# Patient Record
Sex: Female | Born: 1945
Health system: Southern US, Community
[De-identification: ages and names within clinical notes are randomized; demographics above are authoritative.]

## PROBLEM LIST (undated history)

## (undated) DIAGNOSIS — I10 Essential (primary) hypertension: Secondary | ICD-10-CM

## (undated) DIAGNOSIS — M549 Dorsalgia, unspecified: Secondary | ICD-10-CM

## (undated) DIAGNOSIS — E119 Type 2 diabetes mellitus without complications: Secondary | ICD-10-CM

## (undated) DIAGNOSIS — N189 Chronic kidney disease, unspecified: Secondary | ICD-10-CM

## (undated) HISTORY — PX: ABDOMINAL HYSTERECTOMY: SHX81

## (undated) HISTORY — DX: Chronic kidney disease, unspecified: N18.9

---

## 1999-06-27 ENCOUNTER — Emergency Department (HOSPITAL_COMMUNITY): Admission: EM | Admit: 1999-06-27 | Discharge: 1999-06-27 | Payer: Self-pay | Admitting: Emergency Medicine

## 1999-06-28 ENCOUNTER — Encounter: Payer: Self-pay | Admitting: Emergency Medicine

## 2000-12-04 ENCOUNTER — Emergency Department (HOSPITAL_COMMUNITY): Admission: EM | Admit: 2000-12-04 | Discharge: 2000-12-04 | Payer: Self-pay | Admitting: Emergency Medicine

## 2001-01-22 ENCOUNTER — Encounter: Payer: Self-pay | Admitting: General Practice

## 2001-01-22 ENCOUNTER — Encounter: Admission: RE | Admit: 2001-01-22 | Discharge: 2001-01-22 | Payer: Self-pay | Admitting: General Practice

## 2001-01-31 ENCOUNTER — Encounter: Payer: Self-pay | Admitting: *Deleted

## 2001-01-31 ENCOUNTER — Emergency Department (HOSPITAL_COMMUNITY): Admission: EM | Admit: 2001-01-31 | Discharge: 2001-01-31 | Payer: Self-pay

## 2001-02-28 ENCOUNTER — Encounter: Payer: Self-pay | Admitting: Neurological Surgery

## 2001-02-28 ENCOUNTER — Ambulatory Visit (HOSPITAL_COMMUNITY): Admission: RE | Admit: 2001-02-28 | Discharge: 2001-02-28 | Payer: Self-pay | Admitting: Neurological Surgery

## 2001-10-27 ENCOUNTER — Encounter: Payer: Self-pay | Admitting: Internal Medicine

## 2001-10-27 ENCOUNTER — Ambulatory Visit (HOSPITAL_COMMUNITY): Admission: RE | Admit: 2001-10-27 | Discharge: 2001-10-27 | Payer: Self-pay | Admitting: Internal Medicine

## 2002-01-03 ENCOUNTER — Emergency Department (HOSPITAL_COMMUNITY): Admission: EM | Admit: 2002-01-03 | Discharge: 2002-01-03 | Payer: Self-pay | Admitting: *Deleted

## 2002-11-08 ENCOUNTER — Emergency Department (HOSPITAL_COMMUNITY): Admission: EM | Admit: 2002-11-08 | Discharge: 2002-11-08 | Payer: Self-pay | Admitting: Emergency Medicine

## 2003-04-22 ENCOUNTER — Ambulatory Visit (HOSPITAL_COMMUNITY): Admission: RE | Admit: 2003-04-22 | Discharge: 2003-04-22 | Payer: Self-pay | Admitting: Family Medicine

## 2003-04-22 ENCOUNTER — Encounter: Admission: RE | Admit: 2003-04-22 | Discharge: 2003-04-22 | Payer: Self-pay | Admitting: Family Medicine

## 2003-05-11 ENCOUNTER — Encounter: Admission: RE | Admit: 2003-05-11 | Discharge: 2003-05-11 | Payer: Self-pay | Admitting: Sports Medicine

## 2003-05-14 ENCOUNTER — Ambulatory Visit (HOSPITAL_COMMUNITY): Admission: RE | Admit: 2003-05-14 | Discharge: 2003-05-14 | Payer: Self-pay | Admitting: Family Medicine

## 2003-05-14 ENCOUNTER — Encounter: Payer: Self-pay | Admitting: Family Medicine

## 2003-05-20 ENCOUNTER — Encounter: Admission: RE | Admit: 2003-05-20 | Discharge: 2003-08-18 | Payer: Self-pay | Admitting: Family Medicine

## 2003-05-26 ENCOUNTER — Encounter: Admission: RE | Admit: 2003-05-26 | Discharge: 2003-05-26 | Payer: Self-pay | Admitting: Family Medicine

## 2003-08-03 ENCOUNTER — Encounter: Admission: RE | Admit: 2003-08-03 | Discharge: 2003-08-03 | Payer: Self-pay | Admitting: Family Medicine

## 2003-08-27 ENCOUNTER — Encounter: Admission: RE | Admit: 2003-08-27 | Discharge: 2003-08-27 | Payer: Self-pay | Admitting: Sports Medicine

## 2003-08-27 ENCOUNTER — Encounter: Payer: Self-pay | Admitting: Sports Medicine

## 2003-08-30 ENCOUNTER — Encounter (INDEPENDENT_AMBULATORY_CARE_PROVIDER_SITE_OTHER): Payer: Self-pay | Admitting: *Deleted

## 2003-08-30 LAB — CONVERTED CEMR LAB

## 2003-09-03 ENCOUNTER — Encounter: Admission: RE | Admit: 2003-09-03 | Discharge: 2003-09-03 | Payer: Self-pay | Admitting: Sports Medicine

## 2003-11-24 ENCOUNTER — Encounter: Admission: RE | Admit: 2003-11-24 | Discharge: 2003-11-24 | Payer: Self-pay | Admitting: Family Medicine

## 2004-03-14 ENCOUNTER — Encounter: Admission: RE | Admit: 2004-03-14 | Discharge: 2004-03-14 | Payer: Self-pay | Admitting: Family Medicine

## 2004-08-03 ENCOUNTER — Ambulatory Visit: Payer: Self-pay | Admitting: Family Medicine

## 2004-08-10 ENCOUNTER — Ambulatory Visit: Payer: Self-pay | Admitting: Family Medicine

## 2004-11-28 ENCOUNTER — Ambulatory Visit: Payer: Self-pay | Admitting: Family Medicine

## 2004-12-14 ENCOUNTER — Encounter: Admission: RE | Admit: 2004-12-14 | Discharge: 2004-12-14 | Payer: Self-pay | Admitting: Family Medicine

## 2005-04-03 ENCOUNTER — Ambulatory Visit: Payer: Self-pay | Admitting: Family Medicine

## 2005-04-18 ENCOUNTER — Ambulatory Visit: Payer: Self-pay | Admitting: Family Medicine

## 2005-08-08 ENCOUNTER — Ambulatory Visit: Payer: Self-pay | Admitting: Family Medicine

## 2005-10-06 ENCOUNTER — Emergency Department (HOSPITAL_COMMUNITY): Admission: AD | Admit: 2005-10-06 | Discharge: 2005-10-06 | Payer: Self-pay | Admitting: Family Medicine

## 2005-10-08 ENCOUNTER — Ambulatory Visit: Payer: Self-pay | Admitting: Family Medicine

## 2005-12-20 ENCOUNTER — Ambulatory Visit: Payer: Self-pay | Admitting: Sports Medicine

## 2006-01-02 ENCOUNTER — Ambulatory Visit: Payer: Self-pay | Admitting: Family Medicine

## 2006-07-30 ENCOUNTER — Ambulatory Visit: Payer: Self-pay | Admitting: Sports Medicine

## 2006-08-13 ENCOUNTER — Encounter: Admission: RE | Admit: 2006-08-13 | Discharge: 2006-08-13 | Payer: Self-pay | Admitting: *Deleted

## 2006-11-28 ENCOUNTER — Ambulatory Visit: Payer: Self-pay | Admitting: Family Medicine

## 2007-01-23 DIAGNOSIS — E119 Type 2 diabetes mellitus without complications: Secondary | ICD-10-CM | POA: Insufficient documentation

## 2007-01-23 DIAGNOSIS — E7800 Pure hypercholesterolemia, unspecified: Secondary | ICD-10-CM | POA: Insufficient documentation

## 2007-01-23 DIAGNOSIS — E1169 Type 2 diabetes mellitus with other specified complication: Secondary | ICD-10-CM | POA: Insufficient documentation

## 2007-01-23 DIAGNOSIS — I10 Essential (primary) hypertension: Secondary | ICD-10-CM | POA: Insufficient documentation

## 2007-01-23 DIAGNOSIS — E1159 Type 2 diabetes mellitus with other circulatory complications: Secondary | ICD-10-CM | POA: Insufficient documentation

## 2007-01-24 ENCOUNTER — Encounter (INDEPENDENT_AMBULATORY_CARE_PROVIDER_SITE_OTHER): Payer: Self-pay | Admitting: *Deleted

## 2007-04-23 ENCOUNTER — Ambulatory Visit: Payer: Self-pay | Admitting: Family Medicine

## 2007-04-23 ENCOUNTER — Telehealth: Payer: Self-pay | Admitting: *Deleted

## 2007-04-25 ENCOUNTER — Telehealth: Payer: Self-pay | Admitting: Family Medicine

## 2007-04-25 ENCOUNTER — Emergency Department (HOSPITAL_COMMUNITY): Admission: EM | Admit: 2007-04-25 | Discharge: 2007-04-25 | Payer: Self-pay | Admitting: Family Medicine

## 2007-04-30 ENCOUNTER — Ambulatory Visit: Payer: Self-pay | Admitting: Family Medicine

## 2007-05-05 ENCOUNTER — Encounter: Admission: RE | Admit: 2007-05-05 | Discharge: 2007-05-05 | Payer: Self-pay | Admitting: Sports Medicine

## 2007-05-14 ENCOUNTER — Ambulatory Visit: Payer: Self-pay | Admitting: Family Medicine

## 2007-05-14 DIAGNOSIS — M5126 Other intervertebral disc displacement, lumbar region: Secondary | ICD-10-CM | POA: Insufficient documentation

## 2007-05-14 HISTORY — DX: Other intervertebral disc displacement, lumbar region: M51.26

## 2007-06-05 LAB — CONVERTED CEMR LAB
LDL Cholesterol: 93 mg/dL
LDL Cholesterol: 93 mg/dL
LDL Cholesterol: 93 mg/dL
LDL Cholesterol: 93 mg/dL
LDL Cholesterol: 93 mg/dL

## 2007-06-13 ENCOUNTER — Encounter (INDEPENDENT_AMBULATORY_CARE_PROVIDER_SITE_OTHER): Payer: Self-pay | Admitting: *Deleted

## 2007-06-13 ENCOUNTER — Ambulatory Visit: Payer: Self-pay | Admitting: Family Medicine

## 2007-06-13 LAB — CONVERTED CEMR LAB
BUN: 14 mg/dL (ref 6–23)
CO2: 26 meq/L (ref 19–32)
Calcium: 9.4 mg/dL (ref 8.4–10.5)
Chloride: 103 meq/L (ref 96–112)
Creatinine, Ser: 0.8 mg/dL (ref 0.40–1.20)
Direct LDL: 93 mg/dL
Glucose, Bld: 235 mg/dL — ABNORMAL HIGH (ref 70–99)
Potassium: 4.4 meq/L (ref 3.5–5.3)
Sodium: 140 meq/L (ref 135–145)

## 2007-06-17 ENCOUNTER — Encounter (INDEPENDENT_AMBULATORY_CARE_PROVIDER_SITE_OTHER): Payer: Self-pay | Admitting: *Deleted

## 2007-07-11 ENCOUNTER — Ambulatory Visit: Payer: Self-pay | Admitting: Family Medicine

## 2007-12-04 ENCOUNTER — Encounter (INDEPENDENT_AMBULATORY_CARE_PROVIDER_SITE_OTHER): Payer: Self-pay | Admitting: *Deleted

## 2007-12-04 ENCOUNTER — Ambulatory Visit: Payer: Self-pay | Admitting: Family Medicine

## 2007-12-04 LAB — CONVERTED CEMR LAB
ALT: 18 units/L (ref 0–35)
AST: 14 units/L (ref 0–37)
Albumin: 4.1 g/dL (ref 3.5–5.2)
Alkaline Phosphatase: 114 units/L (ref 39–117)
BUN: 15 mg/dL (ref 6–23)
CO2: 28 meq/L (ref 19–32)
Calcium: 9.5 mg/dL (ref 8.4–10.5)
Chloride: 99 meq/L (ref 96–112)
Creatinine, Ser: 0.82 mg/dL (ref 0.40–1.20)
Glucose, Bld: 324 mg/dL — ABNORMAL HIGH (ref 70–99)
Hgb A1c MFr Bld: 9.1 %
Potassium: 4.3 meq/L (ref 3.5–5.3)
Sodium: 139 meq/L (ref 135–145)
TSH: 0.623 microintl units/mL (ref 0.350–5.50)
Total Bilirubin: 0.4 mg/dL (ref 0.3–1.2)
Total Protein: 7.2 g/dL (ref 6.0–8.3)

## 2008-05-18 ENCOUNTER — Ambulatory Visit: Payer: Self-pay | Admitting: Family Medicine

## 2008-05-18 DIAGNOSIS — S335XXA Sprain of ligaments of lumbar spine, initial encounter: Secondary | ICD-10-CM | POA: Insufficient documentation

## 2008-05-18 LAB — CONVERTED CEMR LAB: Hgb A1c MFr Bld: 9.5 %

## 2008-06-17 ENCOUNTER — Emergency Department (HOSPITAL_COMMUNITY): Admission: EM | Admit: 2008-06-17 | Discharge: 2008-06-18 | Payer: Self-pay | Admitting: Family Medicine

## 2008-06-29 ENCOUNTER — Telehealth: Payer: Self-pay | Admitting: Family Medicine

## 2008-10-04 ENCOUNTER — Ambulatory Visit: Payer: Self-pay | Admitting: Family Medicine

## 2008-10-04 ENCOUNTER — Encounter: Payer: Self-pay | Admitting: *Deleted

## 2008-10-04 LAB — CONVERTED CEMR LAB: Hgb A1c MFr Bld: 10.6 %

## 2008-10-08 ENCOUNTER — Ambulatory Visit: Payer: Self-pay | Admitting: Family Medicine

## 2008-10-08 ENCOUNTER — Encounter: Payer: Self-pay | Admitting: Family Medicine

## 2008-10-08 LAB — CONVERTED CEMR LAB
Cholesterol: 184 mg/dL (ref 0–200)
HDL: 76 mg/dL (ref >39)
LDL Cholesterol: 96 mg/dL (ref 0–99)
Total CHOL/HDL Ratio: 2.4
Triglycerides: 61 mg/dL (ref <150)
VLDL: 12 mg/dL (ref 0–40)

## 2008-10-18 ENCOUNTER — Encounter: Payer: Self-pay | Admitting: Family Medicine

## 2009-06-15 ENCOUNTER — Telehealth: Payer: Self-pay | Admitting: Family Medicine

## 2009-06-16 ENCOUNTER — Ambulatory Visit: Payer: Self-pay | Admitting: Family Medicine

## 2009-06-16 DIAGNOSIS — M719 Bursopathy, unspecified: Secondary | ICD-10-CM

## 2009-06-16 DIAGNOSIS — M67919 Unspecified disorder of synovium and tendon, unspecified shoulder: Secondary | ICD-10-CM | POA: Insufficient documentation

## 2009-06-27 ENCOUNTER — Telehealth: Payer: Self-pay | Admitting: Family Medicine

## 2009-07-08 ENCOUNTER — Ambulatory Visit: Payer: Self-pay | Admitting: Family Medicine

## 2009-07-08 ENCOUNTER — Encounter: Payer: Self-pay | Admitting: Family Medicine

## 2009-07-08 DIAGNOSIS — E669 Obesity, unspecified: Secondary | ICD-10-CM | POA: Insufficient documentation

## 2009-07-08 LAB — CONVERTED CEMR LAB: Hgb A1c MFr Bld: 7.7 %

## 2009-07-11 ENCOUNTER — Encounter: Payer: Self-pay | Admitting: Family Medicine

## 2009-07-11 LAB — CONVERTED CEMR LAB
ALT: 19 U/L (ref 0–35)
AST: 17 U/L (ref 0–37)
Albumin: 4 g/dL (ref 3.5–5.2)
Alkaline Phosphatase: 95 U/L (ref 39–117)
BUN: 22 mg/dL (ref 6–23)
CO2: 27 meq/L (ref 19–32)
Calcium: 9.5 mg/dL (ref 8.4–10.5)
Chloride: 102 meq/L (ref 96–112)
Creatinine, Ser: 0.82 mg/dL (ref 0.40–1.20)
Direct LDL: 78 mg/dL
Glucose, Bld: 155 mg/dL — ABNORMAL HIGH (ref 70–99)
Potassium: 4 meq/L (ref 3.5–5.3)
Sodium: 141 meq/L (ref 135–145)
Total Bilirubin: 0.4 mg/dL (ref 0.3–1.2)
Total Protein: 7.1 g/dL (ref 6.0–8.3)

## 2009-07-28 ENCOUNTER — Encounter: Payer: Self-pay | Admitting: Family Medicine

## 2009-08-05 ENCOUNTER — Ambulatory Visit: Payer: Self-pay | Admitting: Family Medicine

## 2009-09-03 ENCOUNTER — Encounter (INDEPENDENT_AMBULATORY_CARE_PROVIDER_SITE_OTHER): Payer: Self-pay | Admitting: *Deleted

## 2009-09-03 DIAGNOSIS — Z87891 Personal history of nicotine dependence: Secondary | ICD-10-CM | POA: Insufficient documentation

## 2009-10-17 ENCOUNTER — Ambulatory Visit: Payer: Self-pay | Admitting: Family Medicine

## 2009-10-17 ENCOUNTER — Ambulatory Visit (HOSPITAL_COMMUNITY): Admission: RE | Admit: 2009-10-17 | Discharge: 2009-10-17 | Payer: Self-pay | Admitting: Family Medicine

## 2009-10-19 ENCOUNTER — Encounter: Payer: Self-pay | Admitting: *Deleted

## 2009-10-24 ENCOUNTER — Encounter: Payer: Self-pay | Admitting: Family Medicine

## 2009-11-09 DIAGNOSIS — IMO0002 Reserved for concepts with insufficient information to code with codable children: Secondary | ICD-10-CM | POA: Insufficient documentation

## 2009-11-09 DIAGNOSIS — M5430 Sciatica, unspecified side: Secondary | ICD-10-CM | POA: Insufficient documentation

## 2009-12-12 ENCOUNTER — Encounter (INDEPENDENT_AMBULATORY_CARE_PROVIDER_SITE_OTHER): Payer: Self-pay | Admitting: *Deleted

## 2010-01-23 ENCOUNTER — Encounter: Payer: Self-pay | Admitting: Family Medicine

## 2010-01-26 ENCOUNTER — Telehealth: Payer: Self-pay | Admitting: *Deleted

## 2010-04-07 ENCOUNTER — Encounter: Payer: Self-pay | Admitting: Family Medicine

## 2010-09-11 ENCOUNTER — Telehealth: Payer: Self-pay | Admitting: Family Medicine

## 2010-09-28 ENCOUNTER — Encounter: Payer: Self-pay | Admitting: Family Medicine

## 2010-10-13 ENCOUNTER — Encounter: Payer: Self-pay | Admitting: Family Medicine

## 2010-10-13 ENCOUNTER — Ambulatory Visit: Payer: Self-pay | Admitting: Family Medicine

## 2010-10-13 DIAGNOSIS — R634 Abnormal weight loss: Secondary | ICD-10-CM | POA: Insufficient documentation

## 2010-10-13 LAB — CONVERTED CEMR LAB: Hgb A1c MFr Bld: 6.9 %

## 2010-10-18 ENCOUNTER — Encounter: Payer: Self-pay | Admitting: Family Medicine

## 2010-10-18 LAB — CONVERTED CEMR LAB
ALT: 19 units/L (ref 0–35)
AST: 18 units/L (ref 0–37)
Albumin: 4.3 g/dL (ref 3.5–5.2)
Alkaline Phosphatase: 90 units/L (ref 39–117)
BUN: 19 mg/dL (ref 6–23)
Basophils Absolute: 0 10*3/uL (ref 0.0–0.1)
Basophils Relative: 0 % (ref 0–1)
CO2: 31 meq/L (ref 19–32)
Calcium: 9.6 mg/dL (ref 8.4–10.5)
Chloride: 100 meq/L (ref 96–112)
Cholesterol: 169 mg/dL (ref 0–200)
Creatinine, Ser: 0.94 mg/dL (ref 0.40–1.20)
Eosinophils Absolute: 0.3 10*3/uL (ref 0.0–0.7)
Eosinophils Relative: 4 % (ref 0–5)
Glucose, Bld: 189 mg/dL — ABNORMAL HIGH (ref 70–99)
HCT: 35.3 % — ABNORMAL LOW (ref 36.0–46.0)
HDL: 79 mg/dL (ref >39)
Hemoglobin: 11.3 g/dL — ABNORMAL LOW (ref 12.0–15.0)
LDL Cholesterol: 81 mg/dL (ref 0–99)
Lymphocytes Relative: 31 % (ref 12–46)
Lymphs Abs: 3 10*3/uL (ref 0.7–4.0)
MCHC: 32 g/dL (ref 30.0–36.0)
MCV: 93.1 fL (ref 78.0–100.0)
Monocytes Absolute: 0.6 10*3/uL (ref 0.1–1.0)
Monocytes Relative: 7 % (ref 3–12)
Neutro Abs: 5.7 10*3/uL (ref 1.7–7.7)
Neutrophils Relative %: 59 % (ref 43–77)
Platelets: 189 10*3/uL (ref 150–400)
Potassium: 4.2 meq/L (ref 3.5–5.3)
RBC: 3.79 M/uL — ABNORMAL LOW (ref 3.87–5.11)
RDW: 12.3 % (ref 11.5–15.5)
Sodium: 138 meq/L (ref 135–145)
TSH: 0.741 microintl units/mL (ref 0.350–4.500)
Total Bilirubin: 0.6 mg/dL (ref 0.3–1.2)
Total CHOL/HDL Ratio: 2.1
Total Protein: 6.9 g/dL (ref 6.0–8.3)
Triglycerides: 45 mg/dL (ref <150)
VLDL: 9 mg/dL (ref 0–40)
WBC: 9.7 10*3/uL (ref 4.0–10.5)

## 2010-11-02 ENCOUNTER — Telehealth: Payer: Self-pay | Admitting: Family Medicine

## 2010-11-22 ENCOUNTER — Encounter: Payer: Self-pay | Admitting: Family Medicine

## 2010-11-22 ENCOUNTER — Ambulatory Visit
Admission: RE | Admit: 2010-11-22 | Discharge: 2010-11-22 | Payer: Self-pay | Source: Home / Self Care | Attending: Family Medicine | Admitting: Family Medicine

## 2010-11-22 LAB — CONVERTED CEMR LAB
ALT: 23 units/L (ref 0–35)
AST: 19 units/L (ref 0–37)
Albumin: 3.6 g/dL (ref 3.5–5.2)
Alkaline Phosphatase: 88 units/L (ref 39–117)
BUN: 29 mg/dL — ABNORMAL HIGH (ref 6–23)
CO2: 29 meq/L (ref 19–32)
Calcium: 9.3 mg/dL (ref 8.4–10.5)
Chloride: 102 meq/L (ref 96–112)
Creatinine, Ser: 1.13 mg/dL (ref 0.40–1.20)
Glucose, Bld: 220 mg/dL — ABNORMAL HIGH (ref 70–99)
HCT: 34.7 % — ABNORMAL LOW (ref 36.0–46.0)
Hemoglobin: 11.4 g/dL — ABNORMAL LOW (ref 12.0–15.0)
MCHC: 32.9 g/dL (ref 30.0–36.0)
MCV: 93 fL (ref 78.0–100.0)
Platelets: 183 10*3/uL (ref 150–400)
Potassium: 4.3 meq/L (ref 3.5–5.3)
RBC: 3.73 M/uL — ABNORMAL LOW (ref 3.87–5.11)
RDW: 12.2 % (ref 11.5–15.5)
Sodium: 138 meq/L (ref 135–145)
Total Bilirubin: 0.4 mg/dL (ref 0.3–1.2)
Total Protein: 6.6 g/dL (ref 6.0–8.3)
WBC: 10.5 10*3/uL (ref 4.0–10.5)

## 2010-11-23 ENCOUNTER — Encounter
Admission: RE | Admit: 2010-11-23 | Discharge: 2010-11-23 | Payer: Self-pay | Source: Home / Self Care | Attending: Family Medicine | Admitting: Family Medicine

## 2010-11-25 ENCOUNTER — Emergency Department (HOSPITAL_COMMUNITY)
Admission: EM | Admit: 2010-11-25 | Discharge: 2010-11-25 | Payer: Self-pay | Source: Home / Self Care | Admitting: Family Medicine

## 2010-11-28 ENCOUNTER — Encounter: Payer: Self-pay | Admitting: Family Medicine

## 2010-11-28 ENCOUNTER — Ambulatory Visit
Admission: RE | Admit: 2010-11-28 | Discharge: 2010-11-28 | Payer: Self-pay | Source: Home / Self Care | Attending: Family Medicine | Admitting: Family Medicine

## 2010-11-28 ENCOUNTER — Telehealth: Payer: Self-pay | Admitting: *Deleted

## 2010-11-28 DIAGNOSIS — H532 Diplopia: Secondary | ICD-10-CM | POA: Insufficient documentation

## 2010-11-28 LAB — CONVERTED CEMR LAB: TSH: 0.959 microintl units/mL (ref 0.350–4.500)

## 2010-12-26 NOTE — Miscellaneous (Signed)
Summary: RX Refill  Prescriptions: OMEPRAZOLE 40 MG CPDR (OMEPRAZOLE) 1 tab by mouth daily for stomach  #30 Capsule x 4   Entered and Authorized by:   Suzanna Obey MD   Signed by:   Suzanna Obey MD on 09/28/2010   Method used:   Electronically to        [facility] (retail)       [place], [place]  [id number]       Ph: [phone]       Fax: [phone]   [id number] VASERETIC 10-25 MG TABS (ENALAPRIL-HYDROCHLOROTHIAZIDE) 1 tab by mouth daily for blood pressure  #30 Tablet x 0   Entered and Authorized by:   Suzanna Obey MD   Signed by:   Suzanna Obey MD on 09/28/2010   Method used:   Electronically to        [facility] (retail)       [place], [place]  [id number]       Ph: [phone]       Fax: [phone]   [id number] NEURONTIN 600 MG  TABS (GABAPENTIN) One tab by mouth TID  #90 Tablet x 0   Entered and Authorized by:   Suzanna Obey MD   Signed by:   Suzanna Obey MD on 09/28/2010   Method used:   Electronically to        [facility] (retail)       [place], [place]  [id number]       Ph: [phone]       Fax: [phone]   [id number] ZOCOR 80 MG TABS (SIMVASTATIN) Take 1 tablet by mouth at bedtime  #30 Tablet x 0   Entered and Authorized by:   Suzanna Obey MD   Signed by:   Suzanna Obey MD on 09/28/2010   Method used:   Electronically to        [facility] [address] [address] (retail)       [place], [place]  [id number]       Ph: [phone]       Fax: [phone]   [id number] GLUCOTROL XL 10 MG TB24 (GLIPIZIDE) One tab by mouth daily (needs office visit for reflil)  #30 x 0   Entered and Authorized by:   Suzanna Obey MD   Signed by:   Suzanna Obey MD on 09/28/2010   Method used:   Electronically to        [facility] (retail)       [place], [place]  [id number]       Ph: [phone]       Fax: [phone]   [id number]  Patient needs a refill of all her prescriptions.  She has an appt with you today that had to be bumped, so she is rescheduled to see you on Nov 18th.  She uses [facility]. Kimberly Carlson  September 28, 2010 8:49 AM

## 2010-12-26 NOTE — Progress Notes (Signed)
Summary: Rx Req  Phone Note Refill Request Call back at [phone] Message from:  Patient  Refills Requested: Medication #1:  VASERETIC 10-25 MG TABS 1 tab by mouth daily for blood pressure  Medication #2:  ZOCOR 80 MG TABS Take 1 tablet by mouth at bedtime [facility].  Initial call taken by: Raymond Gurney,  September 11, 2010 2:05 PM  Follow-up for Phone Call        will refill until she comes for her appt on nov 4th Follow-up by: Suzanna Obey MD,  September 11, 2010 2:36 PM    Prescriptions: VASERETIC 10-25 MG TABS (ENALAPRIL-HYDROCHLOROTHIAZIDE) 1 tab by mouth daily for blood pressure  #30 Tablet x 0   Entered and Authorized by:   Suzanna Obey MD   Signed by:   Suzanna Obey MD on 09/11/2010   Method used:   Electronically to        [facility] (retail)       [place], [place]  [id number]       Ph: [phone]       Fax: [phone]   [id number] ZOCOR 80 MG TABS (SIMVASTATIN) Take 1 tablet by mouth at bedtime  #30 Tablet x 0   Entered and Authorized by:   Suzanna Obey MD   Signed by:   Suzanna Obey MD on 09/11/2010   Method used:   Electronically to        [facility] (retail)       [address]       [place], [place]  [id number]       Ph: [phone]       Fax: [phone]   [id number]

## 2010-12-26 NOTE — Assessment & Plan Note (Signed)
Summary: pain in leg,tcb   Vital Signs:  Patient profile:   65 year old female Weight:      193.9 pounds Temp:     98.8 degrees F Pulse rate:   98 / minute BP sitting:   147 / 69  (left arm)  Vitals Entered By: Marcell Barlow RN (October 17, 2009 1:57 PM)  Serial Vital Signs/Assessments:  Time      Position  BP       Pulse  Resp  Temp     By 2:46 PM             130/70                         Carin Hock MD  CC: left leg pain and chest pain Pain Assessment Patient in pain? yes     Location: left leg pain and cherst pain Intensity: leg, 9 chest 5   Primary Care Provider:  Carin Hock MD  CC:  left leg pain and chest pain.  History of Present Illness: Pt here for left leg pain, but when vitals taken by clinical staff, she endorsed chest pain.  Discussed:    1.  stabbing pain in substernal chest.  also a heaviness.  started last night.  took baking soda last night.  better after an hour.  initially pain was 7/10  but went down to 5/10 after baking soda.  no sob.  no radiation.  no nausea.  no lightheaded or diaphoresis.    2.  left leg pain--has history of a herniated disc. had been doing pretty well until 4 days ago. pain is in left lower back radiating to  left leg.    starts left lower back.  goes to distal calf.  entire  leg hurts.  no numbness or paresthesias, just pain.  no sadlle anesthesia.  no bowel or bladder incontinence.  tried ice and heat.  also tried ibuprofen 800 and gabapentin.  no relief.    3.  dm--due for f/u appt.  did not discuss today    Current Medications (verified): 1)  Bayer Childrens Aspirin 81 Mg Chew (Aspirin) .... Take 1 Tablet By Mouth Once A Day 2)  Glucotrol Xl 10 Mg Tb24 (Glipizide) .... One Tab By Mouth Daily 3)  Zocor 80 Mg Tabs (Simvastatin) .... Take 1 Tablet By Mouth At Bedtime 4)  Neurontin 600 Mg  Tabs (Gabapentin) .... One Tab By Mouth Tid 5)  Vaseretic 10-25 Mg Tabs (Enalapril-Hydrochlorothiazide) .Marland Kitchen.. 1 Tab By Mouth  Daily For Blood Pressure 6)  Metformin Hcl 500 Mg Tabs (Metformin Hcl) .Marland Kitchen.. 1 Tab By Mouth Daily For 2 Weeks; Then 1 Tab By Mouth Two Times A Day For Diabetes  Allergies: 1)  ! Glucophage  Past History:  Past Medical History: Reviewed history from 12/04/2007 and no changes required. HERNIATED LUMBAR DISC (ICD-722.10) via MRI 04/2007 HYPERTENSION, BENIGN SYSTEMIC (ICD-401.1) HYPERCHOLESTEROLEMIA (ICD-272.0) DIABETES MELLITUS II, UNCOMPLICATED (XX123456) CATARACT (ICD-366.9)  ekg - wnl - 04/22/2003  Physical Exam  General:  Well-developed,well-nourished,in no acute distress; alert,appropriate and cooperative throughout examination Chest Wall:  no ttp Lungs:  Normal respiratory effort, chest expands symmetrically. Lungs are clear to auscultation, no crackles or wheezes. Heart:  Normal rate and regular rhythm. S1 and S2 normal without gallop, murmur, click, rub or other extra sounds.   Detailed Back/Spine Exam  General:    Well-developed, well-nourished, in no acute distress; alert and oriented x 3.  Gait:    antalgic--favors right leg  Skin:    Intact with no erythema; no scarring.    Palpation:    no ttp over L-spine or paraspinous muscles, but is ttp over sciatic notch  Lumbosacral Exam:  Range of Motion:    Forward Flexion:   90 degrees    Hyperextension:   25 degrees    Right Lateral Bend:   25 degrees    Left Lateral Bend:   25 degrees Squatting:  normal    does not reverse curve with forward flexion Lying Straight Leg Raise:    Right:  negative    Left:  negative Sitting Straight Leg Raise:    Right:  negative    Left:  negative Sciatic Notch:    There is left sciatic notch tenderness. Toe Walking:    Right:  normal    Left:  normal Heel Walking:    Right:  normal    Left:  normal     can heel-toe walk.  cannot elicit lower extremity reflexes   Impression & Recommendations:  Problem # 1:  CHEST PAIN (ICD-786.50) Assessment New normal EKg,  which is very reassuring.  Additionally, pain not typical angina description.   think this is likely GI.  However, given her risk factors, think a cardiology consult for stress testing is warranted.  in the meantime, start PPI.  gave red flags for immediate medical attention Orders: 12 Lead EKG (12 Lead EKG) Cardiology Referral (Cardiology) [facility]- Est  Level 4 [id number])  Problem # 2:  HERNIATED LUMBAR DISC (ICD-722.10) Assessment: Deteriorated In the past has responded to conservative treatment.  Will give nsaids, flexeril.  vicodin for breakthrough pain.  physical therapy referral as well.   Orders: Physical Therapy Referral (PT) [facility]- Est  Level 4 [id number])  Problem # 3:  DIABETES MELLITUS II, UNCOMPLICATED (XX123456) Assessment: Unchanged needs appt to follow up dm.  Reviewed flowsheet.  Her updated medication list for this problem includes:    Bayer Childrens Aspirin 81 Mg Chew (Aspirin) .Marland Kitchen... Take 1 tablet by mouth once a day    Glucotrol Xl 10 Mg Tb24 (Glipizide) ..... One tab by mouth daily    Vaseretic 10-25 Mg Tabs (Enalapril-hydrochlorothiazide) .Marland Kitchen... 1 tab by mouth daily for blood pressure    Metformin Hcl 500 Mg Tabs (Metformin hcl) .Marland Kitchen... 1 tab by mouth daily for 2 weeks; then 1 tab by mouth two times a day for diabetes  Problem # 4:  HYPERTENSION, BENIGN SYSTEMIC (ICD-401.1) Assessment: Unchanged bp high initially, but at goal on recheck Her updated medication list for this problem includes:    Vaseretic 10-25 Mg Tabs (Enalapril-hydrochlorothiazide) .Marland Kitchen... 1 tab by mouth daily for blood pressure  Complete Medication List: 1)  Bayer Childrens Aspirin 81 Mg Chew (Aspirin) .... Take 1 tablet by mouth once a day 2)  Glucotrol Xl 10 Mg Tb24 (Glipizide) .... One tab by mouth daily 3)  Zocor 80 Mg Tabs (Simvastatin) .... Take 1 tablet by mouth at bedtime 4)  Neurontin 600 Mg Tabs (Gabapentin) .... One tab by mouth tid 5)  Vaseretic 10-25 Mg Tabs (Enalapril-hydrochlorothiazide) .Marland Kitchen.. 1  tab by mouth daily for blood pressure 6)  Metformin Hcl 500 Mg Tabs (Metformin hcl) .Marland Kitchen.. 1 tab by mouth daily for 2 weeks; then 1 tab by mouth two times a day for diabetes 7)  Omeprazole 40 Mg Cpdr (Omeprazole) .Marland Kitchen.. 1 tab by mouth daily for stomach 8)  Meloxicam 15 Mg Tabs (Meloxicam) .Marland Kitchen.. 1 tab by mouth  daily for pain.  take every day for 2 weeks; then take as needed 9)  Cyclobenzaprine Hcl 5 Mg Tabs (Cyclobenzaprine hcl) .Marland Kitchen.. 1-2 tabs by mouth three times a day as needed muscle spasm 10)  Vicodin 5-500 Mg Tabs (Hydrocodone-acetaminophen) .Marland Kitchen.. 1 tab by mouth three times a day as needed severe pain.  Patient Instructions: 1)  It was nice to see you today. 2)  For your chest pain, we will refer you to the cardiology for a stress test.  3)  Also, take the medicine for reflux that I prescribed you (omeprazole). 4)  For your leg pain, we will set you up with physical therapy. 5)  Take the meloxicam (anti-inflammatory) and the cyclobenzaprine (spasm). 6)  You can take vicodin for severe pain. 7)  Please schedule a follow-up appointment in the next few weeks for your diabetes and back pain. 8)  If you have crushing chest pain or chest pain with shortness of breath, call 911. Prescriptions: VICODIN 5-500 MG TABS (HYDROCODONE-ACETAMINOPHEN) 1 tab by mouth three times a day as needed severe pain.  #30 x 0   Entered and Authorized by:   Carin Hock MD   Signed by:   Carin Hock MD on 10/17/2009   Method used:   Print then Give to Patient   RxID:   [id number] OMEPRAZOLE 40 MG CPDR (OMEPRAZOLE) 1 tab by mouth daily for stomach  #30 x 1   Entered and Authorized by:   Carin Hock MD   Signed by:   Carin Hock MD on 10/17/2009   Method used:   Electronically to        [facility] (retail)       [place], [place]  [id number]       Ph: [phone]       Fax: [phone]   [id number] CYCLOBENZAPRINE HCL 5 MG TABS  (CYCLOBENZAPRINE HCL) 1-2 tabs by mouth three times a day as needed muscle spasm  #60 x 0   Entered and Authorized by:   Carin Hock MD   Signed by:   Carin Hock MD on 10/17/2009   Method used:   Electronically to        [facility] (retail)       [place], [place]  [id number]       Ph: [phone]       Fax: [phone]   [id number] MELOXICAM 15 MG TABS (MELOXICAM) 1 tab by mouth daily for pain.  take every day for 2 weeks; then take as needed  #30 x 0   Entered and Authorized by:   Carin Hock MD   Signed by:   Carin Hock MD on 10/17/2009   Method used:   Electronically to        [facility] (retail)       [place], [place]  [id number]       Ph: [phone]       Fax: [phone]   RxID:   [id number]   Prevention & Chronic Care Immunizations   Influenza vaccine: Not documented    Tetanus booster: 07/31/2003: Done.   Tetanus booster due: 07/30/2013    Pneumococcal vaccine: Not documented    H. zoster vaccine: Not documented  Colorectal Screening   Hemoccult: Done.  (08/26/2005)  Hemoccult due: Not Indicated    Colonoscopy: done  (01/02/2006)   Colonoscopy due: 01/02/2011  Other Screening   Pap smear: Done.  (08/30/2003)   Pap smear action/deferral: Not indicated S/P hysterectomy  (07/08/2009)   Pap smear due: 08/29/2004    Mammogram: Done.  (12/01/2004)   Mammogram action/deferral: Ordered  (07/08/2009)   Mammogram due: 12/01/2005    DXA bone density scan: Not documented   Smoking status: current  (08/05/2009)  Diabetes Mellitus   HgbA1C: 7.7  (07/08/2009)   Hemoglobin A1C due: 03/03/2008    Eye exam: no diabetic retinopathy mild cataract  (08/03/2009)   Diabetic eye exam action/deferral: Ophthalmology referral  (07/08/2009)   Eye exam due: 07/2010    Foot exam: yes  (10/04/2008)   High risk foot: Not documented   Foot  care education: Not documented    Urine microalbumin/creatinine ratio: Not documented    Diabetes flowsheet reviewed?: Yes   Progress toward A1C goal: At goal  Lipids   Total Cholesterol: 184  (10/08/2008)   Lipid panel action/deferral: LDL Direct Ordered   LDL: 96  (10/08/2008)   LDL Direct: 78  (07/08/2009)   HDL: 76  (10/08/2008)   Triglycerides: 61  (10/08/2008)    SGOT (AST): 17  (07/08/2009)   BMP action: Ordered   SGPT (ALT): 19  (07/08/2009)   Alkaline phosphatase: 95  (07/08/2009)   Total bilirubin: 0.4  (07/08/2009)    Lipid flowsheet reviewed?: Yes   Progress toward LDL goal: At goal  Hypertension   Last Blood Pressure: 147 / 69  (10/17/2009)   Serum creatinine: 0.82  (07/08/2009)   Serum potassium 4.0  (07/08/2009)    Hypertension flowsheet reviewed?: Yes   Progress toward BP goal: At goal  Self-Management Support :   Personal Goals (by the next clinic visit) :     Personal A1C goal: 8  (07/08/2009)     Personal blood pressure goal: 130/80  (07/08/2009)     Personal LDL goal: 100  (07/08/2009)    Diabetes self-management support: Copy of home glucose meter record, CBG self-monitoring log, Written self-care plan  (07/08/2009)    Hypertension self-management support: Written self-care plan  (07/08/2009)    Lipid self-management support: Written self-care plan  (07/08/2009)

## 2010-12-26 NOTE — Letter (Signed)
Summary: Generic Letter  [name]  [address]   [place], [place] [address]   Phone: [phone]  Fax: [phone]    06/17/2007  DEMARA Carlson [address] [place], [place]  [address]  Dear Ms. [name],   Your recent cholesterol was good (LDL <100) and your kidney function also looked great (Cr 0.8).   Your A1c was 8.5 which is elevated.  Idealy this would be below 7.0.  This is a measure of your average blood sugar.  I have prescribed metformin 1000mg  twice daily to the [facility] on [address] to help bring this back down.  Call us if you have any problems with the medication.  You have been on it before and I don't think you had any problems, so I don't anticipate any issues.  Infrequently this medication will cause upset stomach and diarrhea, but it can be avoided by increasing the dose more slowly.   Sincerely,   Druscilla Brownie MD [place]  Appended Document: Generic Letter Patient letter mailed

## 2010-12-26 NOTE — Assessment & Plan Note (Signed)
Summary: r shoulder pain x 1 day/[place]   Vital Signs:  Patient profile:   65 year old female Weight:      187 pounds BMI:     33.78 Pulse rate:   90 / minute BP sitting:   130 / 72  (left arm)  Vitals Entered By: Mauricia Area CMA, (June 16, 2009 9:35 AM) CC: right shoulder pain x 2 days radiates into arm. no injury. Is Patient Diabetic? Yes  Pain Assessment Patient in pain? yes     Location: right shoulder  Intensity: 7 Onset of pain  x 2 days   Primary Care Provider:  Carin Hock MD  CC:  right shoulder pain x 2 days radiates into arm. no injury.Marland Kitchen  History of Present Illness: 2 days of right shoulder and upper arm pain, worse with lifting, putting on jackets. No history of prior pain or injury of that shoulder. Took one Ibuprofen 800 mg last P.M. which has helped some.   No other arthralgias.  Habits & Providers  Alcohol-Tobacco-Diet     Tobacco Status: current     Tobacco Counseling: to quit use of tobacco products  Allergies: 1)  ! Glucophage  Social History: Smoking Status:  current  Physical Exam  General:  Well-developed,well-nourished,in no acute distress; alert,appropriate and cooperative throughout examination Neck:  No deformities, masses, or tenderness noted.   Shoulder/Elbow Exam  General:    Well-developed, well-nourished, normal body habitus; no deformities, normal grooming.    Skin:    Intact, no scars, lesions, rashes, cafe au lait spots or bruising.    Inspection:    Inspection is normal.    Palpation:    tenderness R-deltoid: and tenderness R-biciptal tendon:.    Shoulder Exam:    Right:    Inspection:  Normal    Palpation:  Normal       Location:  right bicipital groove    Stability:  stable    Tenderness:  right bicipital groove    Swelling:  no    Erythema:  no    Painful arc, can't ABduct past 75 degrees due to pain, pain with biceps manuevers   Impression & Recommendations:  Problem # 1:  BURSITIS, RIGHT SHOULDER  (ICD-726.10)  Elements of subacromial and biceps tendonitis. She wishes to try Ibuprofen before an injection.   Orders: [facility]- Est Level  3 [id number])  Complete Medication List: 1)  Bayer Childrens Aspirin 81 Mg Chew (Aspirin) .... Take 1 tablet by mouth once a day 2)  Lisinopril-hydrochlorothiazide 20-25 Mg Tabs (Lisinopril-hydrochlorothiazide) .... Take 1 tab by mouth daily 3)  Glucotrol Xl 10 Mg Tb24 (Glipizide) .... One tab by mouth daily 4)  Zocor 80 Mg Tabs (Simvastatin) .... Take 1 tablet by mouth at bedtime 5)  Neurontin 600 Mg Tabs (Gabapentin) .... One tab by mouth tid 6)  Lantus Solostar 100 Unit/ml Soln (Insulin glargine) .... Take 10 units q am; start this medicine after taking your class at the diabetes education center 7)  Ibuprofen 800 Mg Tabs (Ibuprofen) .... Take one tab three times a day  Patient Instructions: 1)  Continue Ibuprofen 800 mg three times a day with food for 7 - 10 days.  2)  As the pain improves start exercises to improve your range of motion.  3)  Return for steroid injection if you are not much improved in 1-2 weeks.  Prescriptions: IBUPROFEN 800 MG TABS (IBUPROFEN) Take one tab three times a day  #60 x 2   Entered and Authorized  by:   Candelaria Celeste MD   Signed by:   Candelaria Celeste MD on 06/16/2009   Method used:   Electronically to        [id number] (retail)       [address]       [place], [place]  [address]       Ph: [phone] or [phone]       Fax: [phone]   RxID:   [id number]

## 2010-12-26 NOTE — Progress Notes (Signed)
  Phone Note Outgoing Call   Summary of Call: Would you mind calling her and asking her to reschedule her cardiology appt with [facility] (Dr Johnsie Cancel)?  Many thanks Initial call taken by: Carin Hock MD,  January 26, 2010 3:35 PM    spoke with pt and she ensured me that she would call and reschedule her appt.Audelia Hives CMA  January 27, 2010 2:59 PM

## 2010-12-26 NOTE — Assessment & Plan Note (Signed)
Summary: F/U  Kimberly Carlson   Vital Signs:  Patient profile:   65 year old female Weight:      178 pounds Pulse rate:   79 / minute BP sitting:   118 / 70  (right arm)  Vitals Entered By: Mauricia Area CMA, (October 13, 2010 9:06 AM) CC: f/up DM/refill meds. Is Patient Diabetic? Yes Pain Assessment Patient in pain? no        Primary Care Provider:  Suzanna Obey Carlson  CC:  f/up DM/refill meds..  History of Present Illness: 65 yo here for followup.  Has not been seen in over a year.  HYPERTENSION Meds: Taking and tolerating? yes Home BP's: has a cuff, but does not check Chest Pain: no Dyspnea: no  DIABETES Meds: glipize 10, not metformin Taking and tolerating? did not tolerate metfomrin due to GI upset Blood sugars: does not check Hypoglycemic symptoms: no Visual problems: no Monitoring feet: no Numbness/Tingling: no Last eye exam: September 2011 (no retinopathy per patient) Flu/PNA vaccines? declined. has noted some weight loss  Chronic pain; has low back pain and sciatica.  Has not had refills of vicodin.  does not take meds often- occaisionally some OTC pain meds, gabapentin 2-3 times per day, and flexeril once nightly  Habits & Providers  Alcohol-Tobacco-Diet     Tobacco Status: current     Tobacco Counseling: to quit use of tobacco products  Current Medications (verified): 1)  Bayer Childrens Aspirin 81 Mg Chew (Aspirin) .... Take 1 Tablet By Mouth Once A Day 2)  Glucotrol Xl 10 Mg Tb24 (Glipizide) .... One Tab By Mouth Daily (Needs Office Visit For Reflil) 3)  Zocor 80 Mg Tabs (Simvastatin) .... Take 1 Tablet By Mouth At Bedtime 4)  Neurontin 600 Mg  Tabs (Gabapentin) .... One Tab By Mouth Tid 5)  Vaseretic 10-25 Mg Tabs (Enalapril-Hydrochlorothiazide) .Marland Kitchen.. 1 Tab By Mouth Daily For Blood Pressure 6)  Omeprazole 40 Mg Cpdr (Omeprazole) .Marland Kitchen.. 1 Tab By Mouth Daily For Stomach 7)  Cyclobenzaprine Hcl 5 Mg Tabs (Cyclobenzaprine Hcl) .Marland Kitchen.. 1-2 Tabs By Mouth Three Times A Day  As Needed Muscle Spasm 8)  Vicodin 5-500 Mg Tabs (Hydrocodone-Acetaminophen) .Marland Kitchen.. 1 Tab By Mouth Three Times A Day As Needed Severe Pain.  Allergies (verified): 1)  ! Glucophage PMH-FH-SH reviewed for relevance  Review of Systems      See HPI  Physical Exam  General:  Vitals reviewd.  Well-developed,well-nourished,in no acute distress; alert,appropriate and cooperative throughout examination Lungs:  Normal respiratory effort, chest expands symmetrically. Lungs are clear to auscultation, no crackles or wheezes. Heart:  Normal rate and regular rhythm. S1 and S2 normal without gallop, murmur, click, rub or other extra sounds. Abdomen:  Bowel sounds positive,abdomen soft and non-tender without masses, organomegaly or hernias noted. Extremities:  no LE edema Neurologic:  alert & oriented X3.     Impression & Recommendations:  Problem # 1:  HYPERTENSION, BENIGN SYSTEMIC (ICD-401.1) At goal.  No changes.  Would benefit from stress testing.  patient did not keep cardiology apointment.  She declines today.  She has number to reschedule for caridology if she changes her mind.  Her updated medication list for this problem includes:    Vaseretic 10-25 Mg Tabs (Enalapril-hydrochlorothiazide) .Marland Kitchen... 1 tab by mouth daily for blood pressure  Orders: Comp Met-[facility] [id number]) [facility]- Est  Level 4 (99214)  BP today: 118/70 Prior BP: 147/69 (10/17/2009)  Labs Reviewed: K+: 4.0 (07/08/2009) Creat: : 0.82 (07/08/2009)   Chol: 184 (10/08/2008)   HDL:  76 (10/08/2008)   LDL: 96 (10/08/2008)   TG: 61 (10/08/2008)  Problem # 2:  DIABETES MELLITUS II, UNCOMPLICATED (XX123456) At goal on glipizide.  Discussed working on diet to keep A1C < 7.  Does not take metformin due to GI upset even on 500 mg daily.  The following medications were removed from the medication list:    Metformin Hcl 500 Mg Tabs (Metformin hcl) .Marland Kitchen... 1 tab by mouth daily for 2 weeks; then 1 tab by mouth two times a day for  diabetes Her updated medication list for this problem includes:    Bayer Childrens Aspirin 81 Mg Chew (Aspirin) .Marland Kitchen... Take 1 tablet by mouth once a day    Glucotrol Xl 10 Mg Tb24 (Glipizide) ..... One tab by mouth daily (needs office visit for reflil)    Vaseretic 10-25 Mg Tabs (Enalapril-hydrochlorothiazide) .Marland Kitchen... 1 tab by mouth daily for blood pressure  Orders: A1C-[facility] [id number]) [place]- Est  Level 4 [id number])  Problem # 3:  DEGENERATIVE DISC DISEASE (ICD-722.6)  Will refill vicodin so she may have it for intermittant use as needed- Her last refill  was #30  one year ago and we discussed that this is appropriate use o narcotic pain meds.   Continue flexeril and gabapentin  Orders: [place]- Est  Level 4 [id number])  Complete Medication List: 1)  Bayer Childrens Aspirin 81 Mg Chew (Aspirin) .... Take 1 tablet by mouth once a day 2)  Glucotrol Xl 10 Mg Tb24 (Glipizide) .... One tab by mouth daily (needs office visit for reflil) 3)  Zocor 80 Mg Tabs (Simvastatin) .... Take 1 tablet by mouth at bedtime 4)  Neurontin 600 Mg Tabs (Gabapentin) .... One tab by mouth tid 5)  Vaseretic 10-25 Mg Tabs (Enalapril-hydrochlorothiazide) .Marland Kitchen.. 1 tab by mouth daily for blood pressure 6)  Omeprazole 40 Mg Cpdr (Omeprazole) .Marland Kitchen.. 1 tab by mouth daily for stomach 7)  Cyclobenzaprine Hcl 5 Mg Tabs (Cyclobenzaprine hcl) .Marland Kitchen.. 1-2 tabs by mouth three times a day as needed muscle spasm 8)  Vicodin 5-500 Mg Tabs (Hydrocodone-acetaminophen) .Marland Kitchen.. 1 tab by mouth three times a day as needed severe pain.  Other Orders: CBC w/Diff-[facility] [id number]) Lipid-[facility] [id number]) TSH-[facility] [phone])  Patient Instructions: 1)  Will get labs today- we may change your cholesterol medicine as discussed 2)  Don't forget to take care of yourself too! 3)  You are due for your mammogram 4)  Consider flu and pneumonia vaccine 5)  Try tylenol arthritis with ibuprofen for pain.  Be careful- your vicodin has tylenol in it too! 6)  Use your vicodin  sparingly for severe pain. 7)  Follow-up in 4-6 months Prescriptions: VICODIN 5-500 MG TABS (HYDROCODONE-ACETAMINOPHEN) 1 tab by mouth three times a day as needed severe pain.  #30 x 0   Entered and Authorized by:   Kimberly Carlson   Signed by:   Kimberly Carlson on 10/13/2010   Method used:   Print then Give to Patient   RxID:   [id number]    Orders Added: 1)  A1C-[facility] [83036] 2)  CBC w/Diff-[facility] [85025] 3)  Comp Met-[facility] [id number] 4)  Lipid-[facility] [[id number]] 5)  TSH-[facility] [id number] 6)  [facility]- Est  Level 4 [id number]    Laboratory Results   Blood Tests   Date/Time Received: October 13, 2010 9:09 AM  Date/Time Reported: October 13, 2010 9:21 AM   HGBA1C: 6.9%   (Normal Range: Non-Diabetic - 3-6%   Control Diabetic - 6-8%)  Comments: ...............test performed by......Marland KitchenBonnie A. Martinique, MLS (  ASCP)cm      Prevention & Chronic Care Immunizations   Influenza vaccine: Not documented   Influenza vaccine deferral: Refused  (10/13/2010)    Tetanus booster: 07/31/2003: Done.   Tetanus booster due: 07/30/2013    Pneumococcal vaccine: Not documented   Pneumococcal vaccine deferral: Refused  (10/13/2010)    H. zoster vaccine: Not documented   H. zoster vaccine deferral: Refused  (10/13/2010)  Colorectal Screening   Hemoccult: Done.  (08/26/2005)   Hemoccult due: Not Indicated    Colonoscopy: done  (01/02/2006)   Colonoscopy due: 01/02/2011  Other Screening   Pap smear: Done.  (08/30/2003)   Pap smear action/deferral: Not indicated S/P hysterectomy  (07/08/2009)   Pap smear due: 08/29/2004    Mammogram: Done.  (12/01/2004)   Mammogram action/deferral: Ordered  (07/08/2009)   Mammogram due: 12/01/2005    DXA bone density scan: Not documented   Smoking status: current  (10/13/2010)  Diabetes Mellitus   HgbA1C: 6.9  (10/13/2010)   Hemoglobin A1C due: 03/03/2008    Eye exam: no diabetic retinopathy mild cataract  (08/03/2009)   Diabetic eye exam  action/deferral: Ophthalmology referral  (07/08/2009)   Eye exam due: 07/2010    Foot exam: yes  (10/04/2008)   High risk foot: Not documented   Foot care education: Not documented    Urine microalbumin/creatinine ratio: Not documented    Diabetes flowsheet reviewed?: Yes   Progress toward A1C goal: At goal  Lipids   Total Cholesterol: 184  (10/08/2008)   Lipid panel action/deferral: LDL Direct Ordered   LDL: 96  (10/08/2008)   LDL Direct: 78  (07/08/2009)   HDL: 76  (10/08/2008)   Triglycerides: 61  (10/08/2008)    SGOT (AST): 17  (07/08/2009)   BMP action: Ordered   SGPT (ALT): 19  (07/08/2009) CMP ordered    Alkaline phosphatase: 95  (07/08/2009)   Total bilirubin: 0.4  (07/08/2009)    Lipid flowsheet reviewed?: Yes   Progress toward LDL goal: Unchanged  Hypertension   Last Blood Pressure: 118 / 70  (10/13/2010)   Serum creatinine: 0.82  (07/08/2009)   Serum potassium 4.0  (07/08/2009) CMP ordered     Hypertension flowsheet reviewed?: Yes   Progress toward BP goal: At goal  Self-Management Support :   Personal Goals (by the next clinic visit) :     Personal A1C goal: 7  (10/13/2010)     Personal blood pressure goal: 130/80  (07/08/2009)     Personal LDL goal: 100  (07/08/2009)    Patient will work on the following items until the next clinic visit to reach self-care goals:     Medications and monitoring: take my medicines every day  (10/13/2010)     Eating: drink diet soda or water instead of juice or soda, eat more vegetables, use fresh or frozen vegetables, eat foods that are low in salt, eat baked foods instead of fried foods, eat fruit for snacks and desserts, limit or avoid alcohol  (10/13/2010)     Activity: take a 30 minute walk every day  (10/13/2010)     Other: try walking as it gets cooler  (07/08/2009)    Diabetes self-management support: Written self-care plan  (10/13/2010)   Diabetes care plan printed    Hypertension self-management support:  Written self-care plan  (10/13/2010)   Hypertension self-care plan printed.    Lipid self-management support: Written self-care plan  (10/13/2010)   Lipid self-care plan printed.

## 2010-12-26 NOTE — Letter (Signed)
Summary: Generic Letter  [facility]  [address]   [place], [place] [address]   Phone: [phone]  Fax: [phone]    01/23/2010  Kimberly Carlson [address], [place]  [address]  Dear [name],  You are past due for an appointment for your diabetes.  Please call us at your convenience and schedule an appointment.  I look forward to seeing you!!         Sincerely,   Carin Hock MD  Appended Document: Generic Letter mailed.

## 2010-12-26 NOTE — Progress Notes (Signed)
Summary: [facility] request  Phone Note Call from Patient Call back at Home Phone [phone]   Caller: Daughter Summary of Call: pt was seen yesterday for her hip - is in a lot of pain wE [place] #40 NO REFILL SHE IS TO SEE ME OR ANOTHER PROVIDER EARLY NEXT WEEK Initial call taken by: Drucie Ip,  Apr 25, 2007 10:17 AM  Follow-up for Phone Call        states flexeril & prednisone have not helped . pain is still 10/10. Has f/u appt Wed next week. Md ordered vicodin 5/500 #40 no refills 2 by mouth q6h as needed for pain. Called to [facility] on [address]. discussed possible need for stool softner with this med with pt.   Follow-up by: Elige Radon RN,  Apr 25, 2007 10:26 AM

## 2010-12-26 NOTE — Letter (Signed)
Summary: Lipid Letter  [facility]  [address]   [place], [place] [address]   Phone: [phone]  Fax: [phone]    10/18/2008  Akiba Penland [address] [place], [place]  [address]  Dear Ms. Kozma:  We have carefully reviewed your last lipid profile from 10/08/2008 and the results are noted below with a summary of recommendations for lipid management.    Cholesterol:       184     Goal: < 200   HDL "good" Cholesterol:   76     Goal: > 50   LDL "bad" Cholesterol:   96     Goal: < 100, preferably closer to 70   Triglycerides:       61     Goal: < 150        TLC Diet (Therapeutic Lifestyle Change): Saturated Fats & Transfatty acids should be kept < 7% of total calories ***Reduce Saturated Fats Polyunstaurated Fat can be up to 10% of total calories Monounsaturated Fat Fat can be up to 20% of total calories Total Fat should be no greater than 25-35% of total calories Carbohydrates should be 50-60% of total calories Protein should be approximately 15% of total calories Fiber should be at least 20-30 grams a day ***Increased fiber may help lower LDL Total Cholesterol should be < 200mg /day Consider adding plant stanol/sterols to diet (example: Benacol spread) ***A higher intake of unsaturated fat may reduce Triglycerides and Increase HDL    Adjunctive Measures (may lower LIPIDS and reduce risk of Heart Attack) include: Aerobic Exercise (20-30 minutes 3-4 times a week) Limit Alcohol Consumption Weight Reduction Aspirin 75-81 mg a day by mouth (if not allergic or contraindicated) Vitamin E 400 IU a day by mouth Folic Acid 1mg  a day by mouth Dietary Fiber 20-30 grams a day by mouth     Current Medications: 1)    Bayer Childrens Aspirin 81 Mg Chew (Aspirin) .... Take 1 tablet by mouth once a day 2)    Lisinopril-hydrochlorothiazide 20-25 Mg Tabs (Lisinopril-hydrochlorothiazide) .... Take 1 tab by mouth daily 3)    Glucotrol Xl 10 Mg Tb24 (Glipizide)  .... One tab by mouth daily 4)    Zocor 80 Mg Tabs (Simvastatin) .... Take 1 tablet by mouth at bedtime 5)    Neurontin 600 Mg  Tabs (Gabapentin) .... One tab by mouth tid 6)    Lantus Solostar 100 Unit/ml Soln (Insulin glargine) .... Take 10 units q am; start this medicine after taking your class at the diabetes education center  If you have any questions, please call. We appreciate being able to work with you.   Sincerely,    [facility] Carin Hock MD

## 2010-12-26 NOTE — Assessment & Plan Note (Signed)
Summary: f/u per dr [facility]al [facility]   Vital Signs:  Patient Profile:   65 Years Old Female Height:     62.5 inches (158.75 cm) Weight:      201 pounds (91.36 kg) BMI:     36.31 Temp:     96.9 degrees F (36.06 degrees C) Pulse rate:   82 / minute BP sitting:   154 / 81  (left arm)  Pt. in pain?   yes    Location:   lower back and leg leg    Intensity:   8  Vitals Entered By: Dalbert Mayotte (April 30, 2007 9:40 AM)                Chief Complaint:  F/U from last visit.  History of Present Illness: low back and upper leg pain on left not much better. Was seen opver weekend at [facility] and hey started her on gabapentin. She has sparingly used the vicodin I called in--taking 1 every 6 hrs but not helping much so this am she took 2--slightly more relief. Burning aching pain in left lumbar, left buttock andlateral upper thigh. Leg feels a little weak but no falls.         Detailed Back/Spine Exam  Lumbosacral Exam:  Palpation-spinal tenderness:     tender l lower lumbar, spasm noted Range of Motion:    Forward Flexion:   40 degrees    Hyperextension:   5 degrees    Right Lateral Bend:   10 degrees    Left Lateral Bend:   10 degrees Squatting:      cannot squat, cannot sit w spine flexed at hips more than about 70 degrees. Sitting Straight Leg Raise:    Right:  negative    Left:  positive    Impression & Recommendations:  Problem # 1:  LOW BACK PAIN, ACUTE (ICD-724.2) Assessment: Unchanged  Orders: [facility]- Est Level  3 [id number]) MRI (MRI)    Patient Instructions: 1)  mri ls spine and rtc after that 2)  increase gabapentin to 300 three times a day and we discussed ways to do that. OK to take 2 vicodin at a time as long as not driving--precautions given--also new rx for #60 w one refill. Gabapentin 300 mg #90 w 2 r. 3)  RTC after mri 4)  call w new or worsening sx

## 2010-12-26 NOTE — Progress Notes (Signed)
Summary: status of rx  Phone Note Call from Patient Call back at [phone]   Reason for Call: Refill Medication Summary of Call: pt is checking status of several rxs, pharmacy already faxed, [facility] Initial call taken by: Samara Snide,  June 29, 2008 11:36 AM  Follow-up for Phone Call        called pharmacy and he will fax refill request as we did not receive.  patient needs Glucotrol xl 10 mg and Vasoretic 10/12.5 mg. [employer]. will forward to MD. Follow-up by: Marcell Barlow RN,  June 29, 2008 11:57 AM  Additional Follow-up for Phone Call Additional follow up Details #1::        sent electronically to pharmacy Additional Follow-up by: Carin Hock MD,  June 29, 2008 5:43 PM

## 2010-12-26 NOTE — Miscellaneous (Signed)
Summary: Diab.Outpatient Ctr./TS  Clinical Lists Changes referral for [facility]tr filled out, faxed to [phone]. and original copy given to pt.Ravena,  October 04, 2008 9:43 AM

## 2010-12-26 NOTE — Consult Note (Signed)
Summary: [facility]   Imported By: Raymond Gurney 08/10/2009 16:49:10  _____________________________________________________________________  External Attachment:    Type:   Image     Comment:   External Document  Appended Document: [facility]    Clinical Lists Changes  Observations: Added new observation of [id number]: 07/2010 (08/10/2009 22:14) Added new observation of DIAB EYE EX: no diabetic retinopathy mild cataract (08/03/2009 22:15)       Diabetic Eye Exam  Procedure date:  08/03/2009  Findings:      no diabetic retinopathy mild cataract  Procedures Next Due Date:    Diabetic Eye Exam: 07/2010

## 2010-12-26 NOTE — Letter (Signed)
Summary: Appointment - Missed  [facility] [place]  [address] [place]   [place], [place] [address]   Phone: [phone]  Fax: [phone]     December 12, 2009 MRN: [id number]   SHALISHA YOST [address] [place], [place]  [address]   Dear Ms. Kracke,  Our records indicate you missed your appointment on 12/07/2009 with Dr. Johnsie Cancel. It is very important that we reach you to reschedule this appointment. We look forward to participating in your health care needs. Please contact us at the number listed above at your earliest convenience to reschedule this appointment.     Sincerely,   Darnell Level [facility] Scheduling Team

## 2010-12-26 NOTE — Assessment & Plan Note (Signed)
Summary: PHYSICAL/[facility]   Vital Signs:  Patient Profile:   65 Years Old Female Height:     62.5 inches (158.75 cm) Weight:      208.8 pounds Temp:     98.8 degrees F Pulse rate:   94 / minute BP sitting:   128 / 69  (left arm)  Pt. in pain?   yes    Location:   back    Intensity:   8    Type:       aching  Vitals Entered ByArnette Schaumann RN (December 04, 2007 9:30 AM)                  Chief Complaint:  CPE.  History of Present Illness: 65 y/o AAF here for physical exam with:  1) Back pain - Over the summer she was diagnosed with herniated lumbar disc that was associated with burning pain and was started on Neurontin.  This helped some, and even more after the dose was titrated to 600mg  three times daily.  In general it is better now than it had been, but she occasionally wakes up with back pain in a similar location, but sharp in nature. She has this pain today   2) DM - Not taking metformin 2o to GI issues.  last A1c 7.6 back in september of 2007.  Not really checking her blood sugars.  Not really watching her diet.  3) HTN - BP well controlled on combo pill of hctz and enalapril (25/10). NO chest pain, dyspnea, headaches, dizziness, syncope, palpitations.  4) s/p hysterectomy for benign cause - Hasn't had any abnormal pap smears.  Doesn't need one today.  5) Hyperlipidemia - LDL at goal 7/08.  On zocor 80 and tolerating well.  Current Allergies: ! GLUCOPHAGE  Past Medical History:    HERNIATED LUMBAR DISC (ICD-722.10) via MRI 04/2007    HYPERTENSION, BENIGN SYSTEMIC (ICD-401.1)    HYPERCHOLESTEROLEMIA (ICD-272.0)    DIABETES MELLITUS II, UNCOMPLICATED (XX123456)    CATARACT (ICD-366.9)     ekg - wnl - 04/22/2003  Past Surgical History:    s/p Hysterectomy & BSO for benign reasons   Family History:    Reviewed history from 01/23/2007 and no changes required:       Mother-DM, Pt. 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 Tobacco-quit 12/03 restarted late 2006 quit 2007.; Rare EtOH; [employer] religion     Physical Exam  General:     Well-developed,well-nourished,in no acute distress; alert,appropriate and cooperative throughout examination, obese Head:     Externally normal Eyes:     Externally normal Ears:     Externally normal Nose:     Externally normal Mouth:     Externally normal Neck:     NO thyromegaly Breasts:     Pt declined Lungs:     Normal respiratory effort, chest expands symmetrically. Lungs are clear to auscultation, no crackles or wheezes. Heart:     Normal rate and regular rhythm. S1 and S2 normal without gallop, murmur, click, rub or other extra sounds. Abdomen:     Obese, Bowel sounds positive,abdomen soft and non-tender without masses, organomegaly or hernias noted. Msk:     No deformity or scoliosis noted of thoracic or lumbar spine.  Full ROM.  Does have back pain with point of maximum tenderness located over SI joint Pulses:     Bilateral DPs normal Extremities:  Trace edema bilateral ankles Neurologic:     No cranial nerve deficits noted. Station and gait are normal. Plantar reflexes are down-going bilaterally. DTRs are symmetrical throughout. Sensory, motor and coordinative functions appear intact. Skin:     Intact without suspicious lesions or rashes Cervical Nodes:     No lymphadenopathy noted of neck and supraclavicular areas Psych:     Cognition and judgment appear intact. Alert and cooperative with normal attention span and concentration. No apparent delusions, illusions, hallucinations    Impression & Recommendations:  Problem # 1:  HEALTHY ADULT FEMALE (ICD-V70.0) Assessment: Comment Only Stool cards given.  Refused pneumonia and flu vaccines. Orders: Mammogram (Mammogram)   Problem # 2:  HERNIATED LUMBAR DISC (ICD-722.10) Assessment: Improved IN general improved with neurontin, but worse today.  Given  script for flexeril to use on days like today that are worse for her back pain.  From history and physical, she may have some component of SI disease and may benefit from an injection although she seems to be a bit afraid of needles. Will follow for now and reconsider if it becomes worse. Orders: [place]- Est  Level 4 [id number])   Problem # 3:  HYPERTENSION, BENIGN SYSTEMIC (ICD-401.1) Assessment: Improved Bp controlled.  Continue.  Her updated medication list for this problem includes:    Enalapril-hydrochlorothiazide 10-25 Mg Tabs (Enalapril-hydrochlorothiazide) .Marland Kitchen... Take 1 tablet by mouth once a day  Orders: Comp Met-[facility] [id number]) A1C-[facility] [id number]) TSH-[facility] [id number]) [place]- Est  Level 4 [id number])   Problem # 4:  DIABETES MELLITUS II, UNCOMPLICATED ([id number]) Assessment: Unchanged CHeck A1C.  failed metformin.  WIll check A1c and adjust meds as needed.  The following medications were removed from the medication list:    Metformin Hcl 1000 Mg Tabs (Metformin hcl) .Marland Kitchen... Take 1 tablet by mouth two times a day  Her updated medication list for this problem includes:    Bayer Childrens Aspirin 81 Mg Chew (Aspirin) .Marland Kitchen... Take 1 tablet by mouth once a day    Enalapril-hydrochlorothiazide 10-25 Mg Tabs (Enalapril-hydrochlorothiazide) .Marland Kitchen... Take 1 tablet by mouth once a day    Glucotrol Xl 10 Mg Tb24 (Glipizide) ..... One tab by mouth daily    Actos 30 Mg Tabs (Pioglitazone hcl) ..... One tab by mouth daily.  Orders: [place]- Est  Level 4 ([address]214)   Problem # 5:  HYPERCHOLESTEROLEMIA (ICD-272.0) Assessment: Improved Last LDL well controlled on this med.  Continue. Her updated medication list for this problem includes:    Zocor 80 Mg Tabs (Simvastatin) .Marland Kitchen... Take 1 tablet by mouth at bedtime  Orders: [facility]- Est  Level 4 [id number])   Complete Medication List: 1)  Bayer Childrens Aspirin 81 Mg Chew (Aspirin) .... Take 1 tablet by mouth once a day 2)  Enalapril-hydrochlorothiazide 10-25 Mg  Tabs (Enalapril-hydrochlorothiazide) .... Take 1 tablet by mouth once a day 3)  Glucotrol Xl 10 Mg Tb24 (Glipizide) .... One tab by mouth daily 4)  Zocor 80 Mg Tabs (Simvastatin) .... Take 1 tablet by mouth at bedtime 5)  Neurontin 600 Mg Tabs (Gabapentin) .... One tab by mouth tid 6)  Actos 30 Mg Tabs (Pioglitazone hcl) .... One tab by mouth daily.     Prescriptions: ACTOS 30 MG TABS (PIOGLITAZONE HCL) One tab by mouth daily.  #30 x 11   Entered and Authorized by:   Druscilla Brownie MD   Signed by:   Druscilla Brownie MD on 12/04/2007   Method used:   Electronically sent to .[facility]  [place]. [id number]*       [address]       [place],   [address]       Ph: [phone] or [phone]       Fax: [phone]   RxID:   [id number]  ] Laboratory Results   Blood Tests   Date/Time Received: December 04, 2007 10:16  AM  Date/Time Reported: December 04, 2007 10:45 AM   HGBA1C: 9.1%   (Normal Range: Non-Diabetic - 3-6%   Control Diabetic - 6-8%)  Comments: ...............test performed by......Marland KitchenBonnie A. [place], [place] (ASCP)    Called patient after reviewing A1c, but number disconnected.  She will need another agent.  Will prescribe actos 30mg  daily.

## 2010-12-26 NOTE — Assessment & Plan Note (Signed)
Summary: f/up,tcb   Vital Signs:  Patient profile:   65 year old female Height:      62.5 inches Weight:      190 pounds BMI:     34.32 BSA:     1.88 Temp:     97.5 degrees F Pulse rate:   85 / minute BP sitting:   140 / 66  Vitals Entered By: Christen Bame CMA (July 08, 2009 11:48 AM) CC: f/u dm, htn, hld, obesity Pain Assessment Patient in pain? no        Primary Care Provider:  Carin Hock MD  CC:  f/u dm, htn, hld, and obesity.  History of Present Illness: Here for f/u visit for:  1.  dm--A1C 7.7 today, down from 10.6 last visit.  has lost 18 pounds.  trying to eat fewer fried foods, use healthy oils.  on glimeperide 10mg .  not checking sugars.  is interested in getting a meter.  2.  htn--above goal at 140/66 today.  did not take meds this morning b/c she ran out.  last visit I switched her from enalapril-hctz to lisinopril-hctz.  she still had leftover enalapril that she has been taking.  never started the lisinopril-hctz pill.  prefers to stay with the enalapril (does not give particular reason).  3.  hld--last ldl 96.  on simva 80.  taking every day.    4.  obesity--has lost 18 pounds.  bmi 34 today.  eating healthier, but not exercising.    Habits & Providers  Alcohol-Tobacco-Diet     Tobacco Status: quit     Tobacco Counseling: to remain off tobacco products     Year Quit: 2 weeks ago  Allergies: 1)  ! Glucophage  Social History: Smoking Status:  quit  Physical Exam  General:  Well-developed, well-nourished, normal body habitus; no deformities, normal grooming.   Lungs:  Normal respiratory effort, chest expands symmetrically. Lungs are clear to auscultation, no crackles or wheezes. Heart:  Normal rate and regular rhythm. S1 and S2 normal without gallop, murmur, click, rub or other extra sounds. Msk:  feet with no lesions Pulses:  2+ dp pulses Extremities:  no edema Additional Exam:  vital signs reviewed    Impression &  Recommendations:  Problem # 1:  DIABETES MELLITUS II, UNCOMPLICATED (XX123456) Assessment Improved A1C significantly improved with her lifestyle changes.  encouraged her to try metformin again to see if she can tolerate it.  encouraged adding exercise as well.  wrote script for meter.  advised her to check her blood sugars several times per week.   rtc 3 months.  ophtho referral. The following medications were removed from the medication list:    Lisinopril-hydrochlorothiazide 20-25 Mg Tabs (Lisinopril-hydrochlorothiazide) .Marland Kitchen... Take 1 tab by mouth daily    Lantus Solostar 100 Unit/ml Soln (Insulin glargine) .Marland Kitchen... Take 10 units q am; start this medicine after taking your class at the diabetes education center Her updated medication list for this problem includes:    Bayer Childrens Aspirin 81 Mg Chew (Aspirin) .Marland Kitchen... Take 1 tablet by mouth once a day    Glucotrol Xl 10 Mg Tb24 (Glipizide) ..... One tab by mouth daily    Vaseretic 10-25 Mg Tabs (Enalapril-hydrochlorothiazide) .Marland Kitchen... 1 tab by mouth daily for blood pressure    Metformin Hcl 500 Mg Tabs (Metformin hcl) .Marland Kitchen... 1 tab by mouth daily for 2 weeks; then 1 tab by mouth two times a day for diabetes  Orders: A1C-[facility] [id number]) [place]- Est  Level 4 [id number])  Problem # 2:  HYPERTENSION, BENIGN SYSTEMIC (ICD-401.1) Assessment: Deteriorated  not at goal today, but had not taken her meds.  changed back to enalapri-hctzl as she requested.   The following medications were removed from the medication list:    Lisinopril-hydrochlorothiazide 20-25 Mg Tabs (Lisinopril-hydrochlorothiazide) .Marland Kitchen... Take 1 tab by mouth daily Her updated medication list for this problem includes:    Vaseretic 10-25 Mg Tabs (Enalapril-hydrochlorothiazide) .Marland Kitchen... 1 tab by mouth daily for blood pressure  Orders: [place]- Est  Level 4 ([address])  Problem # 3:  HYPERCHOLESTEROLEMIA (ICD-272.0) Assessment: Unchanged check ldl today.  set 100 as short-term goal, but I think 70 is  reasonable long term goal.   Her updated medication list for this problem includes:    Zocor 80 Mg Tabs (Simvastatin) .Marland Kitchen... Take 1 tablet by mouth at bedtime  Orders: T-Lipoprotein (LDL cholesterol)  [id number]) Comp Met-[facility] [id number]) [facility]- Est  Level 4 [id number])  Problem # 4:  OBESITY (ICD-278.00) Assessment: Improved  congratulated wt loss.  encouraged adding some exercise.    Orders: [place]- Est  Level 4 [id number])  Problem # 5:  prevention Assessment: Comment Only gave mammo sheet.  pap not indicated  Complete Medication List: 1)  Bayer Childrens Aspirin 81 Mg Chew (Aspirin) .... Take 1 tablet by mouth once a day 2)  Glucotrol Xl 10 Mg Tb24 (Glipizide) .... One tab by mouth daily 3)  Zocor 80 Mg Tabs (Simvastatin) .... Take 1 tablet by mouth at bedtime 4)  Neurontin 600 Mg Tabs (Gabapentin) .... One tab by mouth tid 5)  Vaseretic 10-25 Mg Tabs (Enalapril-hydrochlorothiazide) .Marland Kitchen.. 1 tab by mouth daily for blood pressure 6)  Metformin Hcl 500 Mg Tabs (Metformin hcl) .Marland Kitchen.. 1 tab by mouth daily for 2 weeks; then 1 tab by mouth two times a day for diabetes  Other Orders: Mammogram (Screening) (Mammo) Ophthalmology Referral (Ophthalmology)  Patient Instructions: 1)  It was nice to see you today. 2)  Great job on your weight loss and controlling your diabetes!  Keep up the good work. 3)  For your diabetes, keep taking the glipizide.  Try starting metformin as well.  I already sent the prescription to your pharmacy. 4)  For your blood pressure, I changed you back to enalapril-hydrochlorathiazide. 5)  Please schedule a follow-up appointment in 3 months for diabetes and blood pressure.  Prescriptions: METFORMIN HCL 500 MG TABS (METFORMIN HCL) 1 tab by mouth daily for 2 weeks; then 1 tab by mouth two times a day for diabetes  #60 x 6   Entered and Authorized by:   Carin Hock MD   Signed by:   Carin Hock MD on 07/08/2009   Method used:   Electronically to        [facility]. [id number]* (retail)       [address]       [place], [place]  [address]       Ph: [phone] or [phone]       Fax: [phone]   RxID:   [id number] VASERETIC 10-25 MG TABS (ENALAPRIL-HYDROCHLOROTHIAZIDE) 1 tab by mouth daily for blood pressure  #30 x 6   Entered and Authorized by:   Carin Hock MD   Signed by:   Carin Hock MD on 07/08/2009   Method used:   Electronically to        [facility]. [phone]* (retail)  [address]       [place], [place]  [address]       Ph: [phone] or [phone]       Fax: [phone]   RxID:   [id number]   Prevention & Chronic Care Immunizations   Influenza vaccine: Not documented    Tetanus booster: 07/31/2003: Done.   Tetanus booster due: 07/30/2013    Pneumococcal vaccine: Not documented    H. zoster vaccine: Not documented  Colorectal Screening   Hemoccult: Done.  (08/26/2005)   Hemoccult due: Not Indicated    Colonoscopy: done  (01/02/2006)   Colonoscopy due: 01/02/2011  Other Screening   Pap smear: Done.  (08/30/2003)   Pap smear action/deferral: Not indicated S/P hysterectomy  (07/08/2009)   Pap smear due: 08/29/2004    Mammogram: Done.  (12/01/2004)   Mammogram action/deferral: Ordered  (07/08/2009)   Mammogram due: 12/01/2005    DXA bone density scan: Not documented   Smoking status: quit  (07/08/2009)  Diabetes Mellitus   HgbA1C: 7.7  (07/08/2009)   Hemoglobin A1C due: 03/03/2008    Eye exam: Not documented   Diabetic eye exam action/deferral: Ophthalmology referral  (07/08/2009)    Foot exam: yes  (10/04/2008)   High risk foot: Not documented   Foot care education: Not documented    Urine microalbumin/creatinine ratio: Not documented  Lipids   Total Cholesterol: 184  (10/08/2008)   Lipid panel action/deferral: LDL Direct Ordered   LDL: 96  (10/08/2008)   LDL Direct: 93  (06/13/2007)   HDL: 76   (10/08/2008)   Triglycerides: 61  (10/08/2008)    SGOT (AST): 14  (12/04/2007)   BMP action: Ordered   SGPT (ALT): 18  (12/04/2007) CMP ordered    Alkaline phosphatase: 114  (12/04/2007)   Total bilirubin: 0.4  (12/04/2007)    Lipid flowsheet reviewed?: Yes   Progress toward LDL goal: At goal  Hypertension   Last Blood Pressure: 140 / 66  (07/08/2009)   Serum creatinine: 0.82  (12/04/2007)   Serum potassium 4.3  (12/04/2007) CMP ordered     Hypertension flowsheet reviewed?: Yes   Progress toward BP goal: Deteriorated  Self-Management Support :   Personal Goals (by the next clinic visit) :     Personal A1C goal: 8  (07/08/2009)     Personal blood pressure goal: 130/80  (07/08/2009)     Personal LDL goal: 100  (07/08/2009)    Patient will work on the following items until the next clinic visit to reach self-care goals:     Medications and monitoring: check my blood sugar, bring all of my medications to every visit  (07/08/2009)     Eating: eat baked foods instead of fried foods  (07/08/2009)     Other: try walking as it gets cooler  (07/08/2009)    Diabetes self-management support: Copy of home glucose meter record, CBG self-monitoring log, Written self-care plan  (07/08/2009)   Diabetes care plan printed    Hypertension self-management support: Written self-care plan  (07/08/2009)   Hypertension self-care plan printed.    Lipid self-management support: Written self-care plan  (07/08/2009)   Lipid self-care plan printed.   Nursing Instructions: Schedule screening mammogram (see order) Refer for screening diabetic eye exam (see order)    Laboratory Results   Urine Tests  Date/Time Received:      Blood Tests   Date/Time Received: July 08, 2009 11:50 AM  Date/Time Reported: July 08, 2009 12:39 PM   HGBA1C: 7.7%   (Normal Range: Non-Diabetic - 3-6%   Control Diabetic - 6-8%)  Comments: ...............test performed by......Marland KitchenBonnie A. [place], [place]  (ASCP)

## 2010-12-26 NOTE — Progress Notes (Signed)
Summary: [facility] request  Phone Note Call from Patient Call back at Home Phone [phone]   Reason for Call: Talk to Nurse Summary of Call: pt has lower back and leg pain - 5 days Initial call taken by: Drucie Ip,  Apr 23, 2007 8:47 AM  Follow-up for Phone Call        back pain x5 days. now going down left leg. ibuprofen not helping. she will get someone to bring her now Follow-up by: Elige Radon RN,  Apr 23, 2007 10:25 AM

## 2010-12-26 NOTE — Progress Notes (Signed)
Summary: triage  Phone Note Call from Patient   Caller: Patient Summary of Call: pt shoulder hurting wants to be seen in a.m. Initial call taken by: Raymond Gurney,  June 15, 2009 4:28 PM  Follow-up for Phone Call        R shoulder pain started last night. denies injury. has not taken any meds, has ibuprofen. told her ok to try with food. she ambidextrous. advised rest of R arm tonight. appt at 9:30 in geri clinic tomorrow Follow-up by: Elige Radon RN,  June 15, 2009 4:48 PM

## 2010-12-26 NOTE — Assessment & Plan Note (Signed)
Summary: MEET NEW DOC/F/U MEDS/[facility]   Vital Signs:  Patient Profile:   65 Years Old Female Height:     62.5 inches (158.75 cm) Weight:      201.9 pounds BMI:     36.47 Pulse rate:   88 / minute BP sitting:   150 / 80  (right arm)  Pt. in pain?   no  Vitals Entered By: Mauricia Area CMA, (October 04, 2008 9:10 AM)              Is Patient Diabetic? Yes     Chief Complaint:  meet new dr. f/up DM.  History of Present Illness: Here for f/u for dm, htn, hld  1.  DM--isn't testing sugars.  no taking actos.  A1C is 10.6.  no hypoglycemic sxs.  has tried metformin in the past, but d/c'd it because of GI side effects.  2.  htn--taking her ace-hctz combo, but not carvedilol.  no adverse effects from hte meds.  last Cr  normal.  3.  HLD--due for FLP.  taking zocor.  no side effects.      Updated Prior Medication List: [facility] ASPIRIN 81 MG CHEW (ASPIRIN) Take 1 tablet by mouth once a day ENALAPRIL-HYDROCHLOROTHIAZIDE 10-25 MG TABS (ENALAPRIL-HYDROCHLOROTHIAZIDE) Take 1 tablet by mouth once a day GLUCOTROL XL 10 MG TB24 (GLIPIZIDE) One tab by mouth daily ZOCOR 80 MG TABS (SIMVASTATIN) Take 1 tablet by mouth at bedtime NEURONTIN 600 MG  TABS (GABAPENTIN) One tab by mouth TID CARVEDILOL 6.25 MG  TABS (CARVEDILOL) 1 tab by mouth two times daily  Current Allergies: ! GLUCOPHAGE  Past Medical History:    Reviewed history from 12/04/2007 and no changes required:       HERNIATED LUMBAR DISC (ICD-722.10) via MRI 04/2007       HYPERTENSION, BENIGN SYSTEMIC (ICD-401.1)       HYPERCHOLESTEROLEMIA (ICD-272.0)       DIABETES MELLITUS II, UNCOMPLICATED (XX123456)       CATARACT (ICD-366.9)        ekg - wnl - 04/22/2003  Past Surgical History:    Reviewed history from 12/04/2007 and no changes required:       s/p Hysterectomy & BSO for benign reasons   Family History:    Mother-DM, Pt. 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 Tobacco-quit 12/03 restarted late 2006 quit [id number] ETOH, no illicit drugs; [employer] religion     Physical Exam  General:     Well-developed,well-nourished,in no acute distress; alert,appropriate and cooperative throughout examination Lungs:     Normal respiratory effort, chest expands symmetrically. Lungs are clear to auscultation, no crackles or wheezes. Heart:     Normal rate and regular rhythm. S1 and S2 normal without gallop, murmur, click, rub or other extra sounds.  Diabetes Management Exam:    Foot Exam (with socks and/or shoes not present):       Sensory-Pinprick/Light touch:          Left medial foot (L-4): normal          Left dorsal foot (L-5): normal          Left lateral foot (S-1): normal          Right medial foot (L-4): normal          Right dorsal foot (L-5): normal          Right lateral foot (S-1):  normal       Sensory-Monofilament:          Left foot: normal          Right foot: normal       Sensory-other: decreased sensation right heel; otherwise normal       Inspection:          Left foot: normal          Right foot: normal       Nails:          Left foot: normal          Right foot: normal    Impression & Recommendations:  Problem # 1:  DIABETES MELLITUS II, UNCOMPLICATED (XX123456) Assessment: Deteriorated discussed options with pt; she is willing to start lantus; I think this will be the simplest, most effective way to get her closer to A1C of 7 start lantus referral to DM education center stay on glipizide not taking actos consder metformin in the future ophtho referral need to discuss diet and exercise at next visit  The following medications were removed from the medication list:    Actos 30 Mg Tabs (Pioglitazone hcl) ..... One tab by mouth daily.  Her updated medication list for this problem includes:    Bayer Childrens Aspirin 81 Mg Chew (Aspirin) .Marland Kitchen... Take 1 tablet by mouth  once a day    Lisinopril-hydrochlorothiazide 20-25 Mg Tabs (Lisinopril-hydrochlorothiazide) .Marland Kitchen... Take 1 tab by mouth daily    Glucotrol Xl 10 Mg Tb24 (Glipizide) ..... One tab by mouth daily    Lantus Solostar 100 Unit/ml Soln (Insulin glargine) .Marland Kitchen... Take 10 units q am; start this medicine after taking your class at the diabetes education center  Orders: A1C-[facility] [id number]) [place]- Est  Level 4 [id number]) Ophthalmology Referral (Ophthalmology)   Problem # 2:  HYPERTENSION, BENIGN SYSTEMIC (ICD-401.1) Assessment: Deteriorated not taking carvedilol change current ace-hctz combo to lisinopril 20-hctz 25  The following medications were removed from the medication list:    Carvedilol 6.25 Mg Tabs (Carvedilol) .Marland Kitchen... 1 tab by mouth two times daily  Her updated medication list for this problem includes:    Lisinopril-hydrochlorothiazide 20-25 Mg Tabs (Lisinopril-hydrochlorothiazide) .Marland Kitchen... Take 1 tab by mouth daily  Orders: [facility]- Est  Level 4 ([address]214)   Problem # 3:  HYPERCHOLESTEROLEMIA (ICD-272.0) Assessment: Unchanged check flp this week Her updated medication list for this problem includes:    Zocor 80 Mg Tabs (Simvastatin) .Marland Kitchen... Take 1 tablet by mouth at bedtime  Orders: [facility]- Est  Level 4 [id number]) Ophthalmology Referral (Ophthalmology)  Future Orders: Lipid-[facility] [id number]) ... 10/03/2009   Problem # 4:  HEALTHY ADULT FEMALE (ICD-V70.0)  Orders: Mammogram (Mammogram)   Complete Medication List: 1)  Bayer Childrens Aspirin 81 Mg Chew (Aspirin) .... Take 1 tablet by mouth once a day 2)  Lisinopril-hydrochlorothiazide 20-25 Mg Tabs (Lisinopril-hydrochlorothiazide) .... Take 1 tab by mouth daily 3)  Glucotrol Xl 10 Mg Tb24 (Glipizide) .... One tab by mouth daily 4)  Zocor 80 Mg Tabs (Simvastatin) .... Take 1 tablet by mouth at bedtime 5)  Neurontin 600 Mg Tabs (Gabapentin) .... One tab by mouth tid 6)  Lantus Solostar 100 Unit/ml Soln (Insulin glargine) .... Take 10 units q am;  start this medicine after taking your class at the diabetes education center   Patient Instructions: 1)  It was nice to meet you today. 2)  Please schedule a follow-up appointment in 1 month. 3)  Make a lab appointment for later this week to measure  your cholesterol. 4)  Call the Diabetes Education Center for an appointment to learn how to use lantus. 5)  Once you've gone to that appt., start taking 10 units of lantus in the morning. 6)  Start measuring your blood sugars in the morning before you eat at least 4 times a week.   7)  Call me in 2 weeks and let me know what your blood sugars are. 8)  Start taking your new blood pressure medicine:  lisinopril-hctz.  Throw away your old medicine (enalapril-hctz). 9)  Make an appt for your mammogram and for the ophthomologist.   Prescriptions: LANTUS SOLOSTAR 100 UNIT/ML SOLN (INSULIN GLARGINE) take 10 units q am; start this medicine after taking your class at the [facility] Education [facility]  #1 box x 12   Entered and Authorized by:   Carin Hock MD   Signed by:   Carin Hock MD on 10/04/2008   Method used:   Electronically to        [facility] [id number]* (retail)       [address]       [place], [place]  [address]       Ph: [phone] or [phone]       Fax: [phone]   RxID:   [id number] LISINOPRIL-HYDROCHLOROTHIAZIDE 20-25 MG TABS (LISINOPRIL-HYDROCHLOROTHIAZIDE) take 1 tab by mouth daily  #30 x 6   Entered and Authorized by:   Carin Hock MD   Signed by:   Carin Hock MD on 10/04/2008   Method used:   Electronically to        [facility]. [id number]* (retail)       [address]       [place], [place]  [address]       Ph: [phone] or [phone]       Fax: [phone]   RxID:   [id number]  ] Laboratory Results   Blood Tests   Date/Time Received: October 04, 2008 9:16 AM  Date/Time Reported: October 04, 2008 9:25 AM   HGBA1C: 10.6%   (Normal Range: Non-Diabetic - 3-6%   Control Diabetic - 6-8%)  Comments: ...........test performed by..........Marland KitchenMarland KitchenMauricia Area, CMA entered by Hedy Camara, CMA

## 2010-12-26 NOTE — Letter (Signed)
Summary: Lipid Letter  [facility]  [address]   [place], [place] [address]   Phone: [phone]  Fax: [phone]    10/18/2010  Kimberly Carlson [address] [place], [place]  [address]  Dear Ms. Dirr:  We have carefully reviewed your last lipid profile from 10/13/2010 and the results are noted below with a summary of recommendations for lipid management.    Cholesterol:       169     Goal: <200   HDL "good" Cholesterol:   79     Goal: >40   LDL "bad" Cholesterol:   81     Goal: <100   Triglycerides:       45     Goal: <150    Your cholesterol levels are good.  Will continue your simvastatin at your normal dose at this time.    TLC Diet (Therapeutic Lifestyle Change): Saturated Fats & Transfatty acids should be kept < 7% of total calories ***Reduce Saturated Fats Polyunstaurated Fat can be up to 10% of total calories Monounsaturated Fat Fat can be up to 20% of total calories Total Fat should be no greater than 25-35% of total calories Carbohydrates should be 50-60% of total calories Protein should be approximately 15% of total calories Fiber should be at least 20-30 grams a day ***Increased fiber may help lower LDL Total Cholesterol should be < 200mg /day Consider adding plant stanol/sterols to diet (example: Benacol spread) ***A higher intake of unsaturated fat may reduce Triglycerides and Increase HDL    Adjunctive Measures (may lower LIPIDS and reduce risk of Heart Attack) include: Aerobic Exercise (20-30 minutes 3-4 times a week) Limit Alcohol Consumption Weight Reduction Aspirin 75-81 mg a day by mouth (if not allergic or contraindicated) Dietary Fiber 20-30 grams a day by mouth     Current Medications: 1)    Bayer Childrens Aspirin 81 Mg Chew (Aspirin) .... Take 1 tablet by mouth once a day 2)    Glucotrol Xl 10 Mg Tb24 (Glipizide) .... One tab by mouth daily (needs office visit for reflil) 3)    Zocor 80 Mg Tabs (Simvastatin) .... Take 1  tablet by mouth at bedtime 4)    Neurontin 600 Mg  Tabs (Gabapentin) .... One tab by mouth tid 5)    Vaseretic 10-25 Mg Tabs (Enalapril-hydrochlorothiazide) .Marland Kitchen.. 1 tab by mouth daily for blood pressure 6)    Omeprazole 40 Mg Cpdr (Omeprazole) .Marland Kitchen.. 1 tab by mouth daily for stomach 7)    Cyclobenzaprine Hcl 5 Mg Tabs (Cyclobenzaprine hcl) .Marland Kitchen.. 1-2 tabs by mouth three times a day as needed muscle spasm 8)    Vicodin 5-500 Mg Tabs (Hydrocodone-acetaminophen) .Marland Kitchen.. 1 tab by mouth three times a day as needed severe pain.  If you have any questions, please call. We appreciate being able to work with you.   Sincerely,    [facility] Suzanna Obey MD  Appended Document: Lipid Letter mailed

## 2010-12-26 NOTE — Assessment & Plan Note (Signed)
Summary: chest congestion,tcb   Vital Signs:  Patient profile:   65 year old female Height:      62.5 inches Weight:      190.8 pounds BMI:     34.47 Temp:     97.9 degrees F oral Pulse rate:   86 / minute BP sitting:   113 / 63  (left arm) Cuff size:   regular  Vitals Entered By: Lupita Raider CMA, (August 05, 2009 9:32 AM) CC: Cough Is Patient Diabetic? No Pain Assessment Patient in pain? yes     Location: chest Intensity: 3   CC:  Cough.  Acute Visit History:      The patient complains of cough and nasal discharge.  These symptoms began 5 days ago.  She denies fever and sore throat.  Other comments include: grandson with similar symptoms. has not tried any medication. no fever. cough nonproductive. smokes intermittently. acting normal. able to eat/drink ok. .        The character of the cough is described as nonproductive.  She has no history of COPD.  There is no history of wheezing, sleep interference, shortness of breath, respiratory retractions, tachypnea, cyanosis, or interference with oral intake associated with her cough.        Habits & Providers  Alcohol-Tobacco-Diet     Tobacco Status: current     Tobacco Counseling: to quit use of tobacco products  Current Medications (verified): 1)  Bayer Childrens Aspirin 81 Mg Chew (Aspirin) .... Take 1 Tablet By Mouth Once A Day 2)  Glucotrol Xl 10 Mg Tb24 (Glipizide) .... One Tab By Mouth Daily 3)  Zocor 80 Mg Tabs (Simvastatin) .... Take 1 Tablet By Mouth At Bedtime 4)  Neurontin 600 Mg  Tabs (Gabapentin) .... One Tab By Mouth Tid 5)  Vaseretic 10-25 Mg Tabs (Enalapril-Hydrochlorothiazide) .Marland Kitchen.. 1 Tab By Mouth Daily For Blood Pressure 6)  Metformin Hcl 500 Mg Tabs (Metformin Hcl) .Marland Kitchen.. 1 Tab By Mouth Daily For 2 Weeks; Then 1 Tab By Mouth Two Times A Day For Diabetes 7)  Tessalon Perles 100 Mg Caps (Benzonatate) .... One Tab By Mouth Three Times A Day As Needed Cough  Allergies (verified): 1)  ! 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 Tobacco-quit 12/03 restarted late 2006 quit 2007, restarted again. ;no ETOH, no illicit drugs; Baptist religionSmoking Status:  current  Physical Exam  General:  obese female, NAd. vitals reviewed Nose:  External nasal examination shows no deformity or inflammation. Nasal mucosa are pink and moist without lesions or exudates. Mouth:  Oral mucosa and oropharynx without lesions or exudates.  Teeth in good repair. Neck:  no lymphadenopathy Lungs:  Normal respiratory effort, chest expands symmetrically. Lungs are clear to auscultation, no crackles or wheezes. Heart:  Normal rate and regular rhythm. S1 and S2 normal without gallop, murmur, click, rub or other extra sounds.   Impression & Recommendations:  Problem # 1:  URI (ICD-465.9) Assessment New  supportive care. cough likely worsened by tobacco abuse. counseled to quit. no wheezing or other concerning physical exam findings. likely some underlying COPD, but will not treat as exacerbation. will try tessalon perles. if symptoms worsen, consider tx as COPD exac.  f/u as needed.   Her updated medication list for this problem includes:    Bayer Childrens Aspirin 81 Mg Chew (Aspirin) .Marland Kitchen... Take 1 tablet by mouth once a day    Tessalon Perles 100 Mg Caps (Benzonatate) .Marland KitchenMarland KitchenMarland KitchenMarland Kitchen  One tab by mouth three times a day as needed cough  Orders: [facility]- Est Level  3 [id number])  Medications Added to Medication List This Visit: 1)  Tessalon Perles 100 Mg Caps (Benzonatate) .... One tab by mouth three times a day as needed cough  Patient Instructions: 1)  Cough medicine- tessalon perles. you can also try delsym (over the counter) 2)  If your symptoms worsen, give [place] a call.  Prescriptions: TESSALON PERLES 100 MG CAPS (BENZONATATE) one tab by mouth three times a day as needed cough  #45 x 0   Entered and Authorized by:   Nancy Nordmann  MD   Signed by:   Nancy Nordmann  MD on 08/05/2009   Method used:   Electronically to        [facility]. [id number]* (retail)       [address]       [place], [place]  [address]       Ph: [phone] or [phone]       Fax: [phone]   RxID:   [id number]

## 2010-12-26 NOTE — Letter (Signed)
Summary: Generic Letter  [facility]  [address]   [place], [place] [address]   Phone: [phone]  Fax: [phone]    10/19/2009  DORINDA AYOUB [address], [place]  [address]  Dear Ms. Krieger,       I have been unable to contact you by phone. An appointment with Dr. Johnsie Cancel , cardiologist , has been scheduled for you for  December 17,2010 at 3:45 PM.  Dr. Johnsie Cancel is with [facility] at [facility], [place]. The office phone number is [phone]. If you are unable to keep this appointment please call their office to reschedule.       Thank you.           Sincerely,   Marcell Barlow RN

## 2010-12-26 NOTE — Letter (Signed)
Summary: Generic Letter  [facility]  [address]   [place], [place] [address]   Phone: [phone]  Fax: [phone]    04/07/2010  RAIAH CIANCI [address] [place],   [address]  Dear Ms. Pasion,   I just wanted to remind you to reschedule your cardiology appointment with Dr. Johnsie Cancel.  Please call our office if you need help doing this.        Sincerely,   Carin Hock MD  Appended Document: Generic Letter mailed.

## 2010-12-26 NOTE — Assessment & Plan Note (Signed)
Summary: C/O OF LF LEG PAIN/FEET SWELLING/[facility]   Vital Signs:  Patient Profile:   65 Years Old Female Height:     62.5 inches (158.75 cm) Weight:      206.9 pounds BMI:     37.37 Temp:     97.8 degrees F Pulse rate:   90 / minute BP sitting:   121 / 70  (left arm)  Pt. in pain?   no  Vitals Entered By: Lupita Raider CMA, (July 11, 2007 11:30 AM)                Chief Complaint:  Left leg numbness/pain.  History of Present Illness: Kimberly Carlson with:  1) Left leg pain - this is associated with a numbness.  THe sharp acute pain has resolved with the neurontin, but she still has some paresthesias to light touch in her left leg.  THis is c/w distribution of her disk herniation at L2 level.  SHe feels that she is weak bilaterally below her hips, but walks fairly well with a cane.  No shuffling.  No falls.  NO saddle anesthesia.        Physical Exam  General:     Well-developed,well-nourished,in no acute distress; alert,appropriate and cooperative throughout examination Msk:     Full ROM anteriorly flexion of back.  Decreased ROM to left and posteriorly. Neurologic:     NO DTRs on lower limbs.  Normal sensation to pressure and cold of bilateral thighs, but increased pain to light tough on left leg.  4+/5 strength of left leg ABle to walk on toes and heels.   Skin:     Intact without suspicious lesions or rashes Psych:     Cognition and judgment appear intact. Alert and cooperative with normal attention span and concentration. No apparent delusions, illusions, hallucinations    Impression & Recommendations:  Problem # 1:  HERNIATED LUMBAR DISC (ICD-722.10) Assessment: Deteriorated Will increase neurontin to 600mg  three times a day.  WIll arrange for Physical Therapy. Orders: [facility]- Est Level  3 [id number])   Complete Medication List: 1)  Bayer Childrens Aspirin 81 Mg Chew (Aspirin) .... Take 1 tablet by mouth once a day 2)  Enalapril-hydrochlorothiazide 10-25 Mg  Tabs (Enalapril-hydrochlorothiazide) .... Take 1 tablet by mouth once a day 3)  Glucotrol Xl 10 Mg Tb24 (Glipizide) .... One tab by mouth daily 4)  Zocor 80 Mg Tabs (Simvastatin) .... Take 1 tablet by mouth at bedtime 5)  Neurontin 600 Mg Tabs (Gabapentin) .... One tab by mouth tid 6)  Metformin Hcl 1000 Mg Tabs (Metformin hcl) .... Take 1 tablet by mouth two times a day     Prescriptions: NEURONTIN 600 MG  TABS (GABAPENTIN) One tab by mouth TID  #90 x 5   Entered and Authorized by:   Druscilla Brownie MD   Signed by:   Druscilla Brownie MD on 07/11/2007   Method used:   Electronically sent to ...       [facility] [phone] [address] [employer] [facility]       [address]       [place], [place]  [address]       Ph: [phone] or [phone]       Fax: [phone]   RxID:   [id number]

## 2010-12-26 NOTE — Miscellaneous (Signed)
Summary: Tobacco User  Clinical Lists Changes  Problems: Added new problem of TOBACCO USER (ICD-305.1) 

## 2010-12-26 NOTE — Progress Notes (Signed)
  Phone Note Outgoing Call   Call placed by: Carin Hock MD,  June 27, 2009 12:21 PM Summary of Call: left vm for pt to call me.  received a refill request for enalapril-hctz.  she is supposed to be on lisinopril-hctz.  refilled the lisinopril-hctz.  she needs to schedule a f/u appt for bp and dm.  If she calls back, would you let her know this.  Thanks Initial call taken by: Carin Hock MD,  June 27, 2009 12:22 PM     Appended Document:  pt states she will make an appt. she does not know which med she has been taking. she will bring her bottles in at the next ov.

## 2010-12-26 NOTE — Progress Notes (Signed)
Summary: Rx  Phone Note Refill Request Call back at [phone]   Refills Requested: Medication #1:  GLUCOTROL XL 10 MG TB24 One tab by mouth daily (needs office visit for reflil)  Medication #2:  ZOCOR 80 MG TABS Take 1 tablet by mouth at bedtime  Medication #3:  VASERETIC 10-25 MG TABS 1 tab by mouth daily for blood pressure Initial call taken by: Samara Snide,  November 02, 2010 10:39 AM    Prescriptions: VASERETIC 10-25 MG TABS (ENALAPRIL-HYDROCHLOROTHIAZIDE) 1 tab by mouth daily for blood pressure  #30 Tablet x 5   Entered and Authorized by:   Suzanna Obey MD   Signed by:   Suzanna Obey MD on 11/02/2010   Method used:   Electronically to        [facility] (retail)       [place], [place]  [id number]       Ph: [phone]       Fax: [phone]   [id number] GLUCOTROL XL 10 MG TB24 (GLIPIZIDE) One tab by mouth daily (needs office visit for reflil)  #30 x 5   Entered and Authorized by:   Suzanna Obey MD   Signed by:   Suzanna Obey MD on 11/02/2010   Method used:   Electronically to        [facility] (retail)       [place], [place]  [id number]       Ph: [phone]       Fax: [phone]   [id number]  Zocor already refilled Suzanna Obey MD  November 02, 2010 4:08 PM

## 2010-12-26 NOTE — Assessment & Plan Note (Signed)
Summary: back pain wp   Vital Signs:  Patient Profile:   65 Years Old Female Height:     62.5 inches (158.75 cm) Weight:      201.7 pounds Temp:     98.1 degrees F Pulse rate:   91 / minute BP sitting:   144 / 70  (left arm)  Pt. in pain?   yes    Location:   back    Intensity:   5  Vitals Entered By: Carolyne Littles (May 18, 2008 11:01 AM)              Is Patient Diabetic? Yes      Chief Complaint:  Back Pain.  History of Present Illness: 65 y/o AAF with:  1) Back pain - This is on her left lumbar region, but higher than the sciatica that she has had in the past.  No saddle paresthesia.  No incontinence.  No leg weakness.  It  has been going on for 5 days.  It is sharp.  vicodin didn't help.  ibuprofen helped some.  No dysuria.  No fever.  no vomiting.  2) DM - Having polyuria.  last A1c and this A1c greater than 9.  Never started Actos since she doesn't like medicines and was worried about side effects.  Has been eating poorly.  3) HTN - BP elevated.  Per patient taking the enalapril/hctz daily.  No chest pain, dyspnea, headaches, dizziness, syncope, palpitations.  4) Obesity - Has lost a little weight since last visit ( ~7 lbs).   Minimal exercise.  eating poorly including sweets and fatty foods.    Current Allergies: ! GLUCOPHAGE    Risk Factors:  Colonoscopy History:     Date of Last Colonoscopy:  01/02/2006   Colonoscopy History:     Date of Last Colonoscopy:  01/02/2006    Results:  done   Colonoscopy History:     Date of Last Colonoscopy:  01/02/2006   Colonoscopy History:     Date of Last Colonoscopy:  01/02/2006    Results:  done   Colonoscopy History:     Date of Last Colonoscopy:  01/02/2006   Colonoscopy History:     Date of Last Colonoscopy:  01/02/2006    Results:  done   Colonoscopy History:     Date of Last Colonoscopy:  01/02/2006   Colonoscopy History:     Date of Last Colonoscopy:  01/02/2006    Results:  done   Colonoscopy  History:     Date of Last Colonoscopy:  01/02/2006   Colonoscopy History:     Date of Last Colonoscopy:  01/02/2006    Results:  done     Physical Exam  General:     morbidly obese,well-nourished,in no acute distress; alert,appropriate and cooperative throughout examination Lungs:     Normal respiratory effort, chest expands symmetrically. Lungs are clear to auscultation, no crackles or wheezes. Heart:     Normal rate and regular rhythm. S1 and S2 normal without gallop, murmur, click, rub or other extra sounds. Msk:     left lumbar region with severe palpable spasm.  Does not stand up fully straight.  Apparent leg length discrepency, but measured lengths essentially equal.  Negative faber.  negative straight leg bilaterally.  Negative Piriformis and SI tenderness.  Normal ROM of back and hip without pain except left side flexion. Psych:     Cognition and judgment appear intact. Alert and cooperative with normal attention span and concentration. No  apparent delusions, illusions, hallucinations    Impression & Recommendations:  Problem # 1:  LUMBAR STRAIN, ACUTE (ICD-847.2) Assessment: New With obvious spasm, we will treat with flexeril as needed.  We will also give her a sript for mobic-->hold enalapril when taking.  Don't take > 2 weeks at a time. Orders: [place]- Est  Level 4 [id number])   Problem # 2:  HYPERTENSION, BENIGN SYSTEMIC (ICD-401.1) Assessment: Deteriorated Coreg added since pulse relatively high and bp uncontrolled.  Her updated medication list for this problem includes:    Enalapril-hydrochlorothiazide 10-25 Mg Tabs (Enalapril-hydrochlorothiazide) .Marland Kitchen... Take 1 tablet by mouth once a day    Carvedilol 6.25 Mg Tabs (Carvedilol) .Marland Kitchen... 1 tab by mouth two times daily  Orders: [place]- Est  Level 4 [id number])   Problem # 3:  DIABETES MELLITUS II, UNCOMPLICATED ([id number]) Assessment: Deteriorated Again counseled on the importance of control.  actos re-prescribed.  Does not  want lantus.  Her updated medication list for this problem includes:    Bayer Childrens Aspirin 81 Mg Chew (Aspirin) .Marland Kitchen... Take 1 tablet by mouth once a day    Enalapril-hydrochlorothiazide 10-25 Mg Tabs (Enalapril-hydrochlorothiazide) .Marland Kitchen... Take 1 tablet by mouth once a day    Glucotrol Xl 10 Mg Tb24 (Glipizide) ..... One tab by mouth daily    Actos 30 Mg Tabs (Pioglitazone hcl) ..... One tab by mouth daily.  Orders: [facility] [id number]) [place]- Est  Level 4 [id number])   Complete Medication List: 1)  Bayer Childrens Aspirin 81 Mg Chew (Aspirin) .... Take 1 tablet by mouth once a day 2)  Enalapril-hydrochlorothiazide 10-25 Mg Tabs (Enalapril-hydrochlorothiazide) .... Take 1 tablet by mouth once a day 3)  Glucotrol Xl 10 Mg Tb24 (Glipizide) .... One tab by mouth daily 4)  Zocor 80 Mg Tabs (Simvastatin) .... Take 1 tablet by mouth at bedtime 5)  Neurontin 600 Mg Tabs (Gabapentin) .... One tab by mouth tid 6)  Actos 30 Mg Tabs (Pioglitazone hcl) .... One tab by mouth daily. 7)  Carvedilol 6.25 Mg Tabs (Carvedilol) .Marland Kitchen.. 1 tab by mouth two times daily 8)  Meloxicam 15 Mg Tabs (Meloxicam) .... One by mouth daily as needed for back pain (do not take the enalapril/hctz while taking this medicine and don't take > 2 weeks at a time) 9)  Flexeril 10 Mg Tabs (Cyclobenzaprine hcl) .... One tab by mouth three times a day as needed back spasm.  do not drive while taking.   Patient Instructions: 1)  We are starting a new blood pressure medicine (carvedilol or coreg) to be taking as well as your current bp medicine (enalapril/hctz) 2)  You should go ahead and start taking the Actos.  Let us know if you develop swelling in your legs.  It is important that you take more medication for your diabetes since it is not under good control 3)  I have prescribed flexeril, which is a muscle relaxant, since your back pain involves deep muscle spasm.  It will likely make you sleepy, so don't take while driving. 4)  You can use  Mobic/meloxicam once daily for back pain control, but don't take for more than 2 weeks at a time and don't take your enalapril on days that you use the mobic/meloxicam. 5)  Diet and exercise changes to lose weight should help your blood pressure and diabetes and back pain.  If you want to see a nutritionist, that can be arranged.   Prescriptions: FLEXERIL 10 MG TABS (CYCLOBENZAPRINE HCL) One tab by mouth three times a  day as needed back spasm.  Do not drive while taking.  #90 x 1   Entered and Authorized by:   Druscilla Brownie MD   Signed by:   Druscilla Brownie MD on 05/18/2008   Method used:   Electronically sent to ...       [facility]. [id number]*       [address]  [address]       [place], [place]  [address]       Ph: [phone] or [phone]       Fax: [phone]   RxID:   [id number] MELOXICAM 15 MG TABS (MELOXICAM) one by mouth daily as needed for back pain (Do not take the enalapril/HCTZ while taking this medicine and don't take > 2 weeks at a time)  #15 x 1   Entered and Authorized by:   Druscilla Brownie MD   Signed by:   Druscilla Brownie MD on 05/18/2008   Method used:   Electronically sent to ...       [facility]. [id number]*       [address]  [address]       [place], [place]  [address]       Ph: [phone] or [phone]       Fax: [phone]   RxID:   [id number] ACTOS 30 MG TABS (PIOGLITAZONE HCL) One tab by mouth daily.  #30 x 11   Entered and Authorized by:   Druscilla Brownie MD   Signed by:   Druscilla Brownie MD on 05/18/2008   Method used:   Electronically sent to ...       [facility]. [id number]*       [place]  [address]       [place], [place]  [address]       Ph: [phone] or [phone]       Fax: [phone]   RxID:   [id number] CARVEDILOL 6.25 MG  TABS (CARVEDILOL) 1 tab by mouth two times daily  #60 x 3   Entered and Authorized by:   Druscilla Brownie MD   Signed by:   Druscilla Brownie MD on 05/18/2008   Method used:   Electronically sent to ...       [facility]. [id number]*       [address]  [address]       [place], [place]  [address]       Ph: [phone] or [phone]       Fax: [phone]   RxID:   [id number]  ]  LDL Result Date:  06/05/2007 LDL Result:  93 LDL Next Due:  1 yr Flex Sig Next Due:  Not Indicated Last Colonoscopy:  Done. (01/02/2006 12:00:00 AM) Colonoscopy Result Date:  01/02/2006 Colonoscopy Result:  done Colonoscopy Next Due:  5 yr Last Hemoccult Result: Done. (08/26/2005 12:00:00 AM) Hemoccult Next Due:  Not Indicated   Laboratory Results   Blood Tests   Date/Time Received: May 18, 2008 11:19 AM  Date/Time Reported: May 18, 2008 11:28 AM   HGBA1C: 9.5%   (Normal Range: Non-Diabetic - 3-6%   Control Diabetic - 6-8%)  Comments: ...............test performed by......Marland KitchenBonnie A. [place], [place] (ASCP)

## 2010-12-26 NOTE — Letter (Signed)
Summary: Generic Letter  [facility]  [address]   [place], [place] [address]   Phone: [phone]  Fax: [phone]    12/04/2007  Kimberly Carlson [address], [place]  [address]  Dear Ms. Barrack,  Your A1C or blood sugar average was too high at 9.2 where it needs to be at 7.0 or less.  We will start you on a new medicine called Actos that should help get this under control.  It tends to be a well-tolerated drug with occasional increase in leg swelling, but you aren't at risk for that to happen.  If you have any questions, please call.  Otherwise, I would like to see you again in 2-3 months.  Sincerely,   Druscilla Brownie MD [facility]  Appended Document: Generic Letter patient letter mailed

## 2010-12-26 NOTE — Assessment & Plan Note (Signed)
Summary: fu/mri/el   Vital Signs:  Patient Profile:   65 Years Old Female Height:     62.5 inches (158.75 cm) Weight:      203.4 pounds BMI:     36.74 Temp:     98.3 degrees F Pulse rate:   105 / minute BP sitting:   145 / 67  Pt. in pain?   yes    Location:    l leg    Intensity:   5  Vitals Entered By: Elige Radon RN (May 14, 2007 9:47 AM)                Chief Complaint:  f/u MRI and back pain.  History of Present Illness: left lopwer back pain about 50 % better, Is very pleased. No problems w gabapentin had MRI and is here to discuss no new sx    Past Medical History:    MRI LS spine shows HNP lumbar 4-5 on right June 2008.        Impression & Recommendations:  Problem # 1:  HERNIATED LUMBAR DISC (ICD-722.10) Assessment: Improved  Orders: [place]- Est Level  3 [id number])    Patient Instructions: 1)  discussed optiojns incl NSU referral, possible need for surgery. For now she is mproving on gabapentin and we will increase that to 1 am, 1 lunch and 2 at bedtime (300 mg tabs) and a rx was given #120 w 2 r. 2)  rtc 1 m 3)  if she continues to do well, we will plan taper up neurontin to best pain control, hold for a month or so and begin taper off--hope to avoid surgery 4)  she still has some vicodin from previous rx 5)  will call immed w any worsening of sx  Appended Document: fu/mri/el        Past Medical History:    MRI LS spine shows HNPlumbar left June 20088.

## 2010-12-26 NOTE — Letter (Signed)
Summary: Lipid Letter  [facility]  [address]   [place], [place] [address]   Phone: [phone]  Fax: [phone]    10/18/2008  Kimberly Carlson [address] [place], [place]  [address]  Dear [name]:  We have carefully reviewed your last lipid profile from 10/08/2008 and the results are noted below with a summary of recommendations for lipid management.    Cholesterol:       184     Goal: <   HDL "good" Cholesterol:   76     Goal: >   LDL "bad" Cholesterol:   96     Goal: <   Triglycerides:       61     Goal: <        TLC Diet (Therapeutic Lifestyle Change): Saturated Fats & Transfatty acids should be kept < 7% of total calories ***Reduce Saturated Fats Polyunstaurated Fat can be up to 10% of total calories Monounsaturated Fat Fat can be up to 20% of total calories Total Fat should be no greater than 25-35% of total calories Carbohydrates should be 50-60% of total calories Protein should be approximately 15% of total calories Fiber should be at least 20-30 grams a day ***Increased fiber may help lower LDL Total Cholesterol should be < 200mg /day Consider adding plant stanol/sterols to diet (example: Benacol spread) ***A higher intake of unsaturated fat may reduce Triglycerides and Increase HDL    Adjunctive Measures (may lower LIPIDS and reduce risk of Heart Attack) include: Aerobic Exercise (20-30 minutes 3-4 times a week) Limit Alcohol Consumption Weight Reduction Aspirin 75-81 mg a day by mouth (if not allergic or contraindicated) Vitamin E 400 IU a day by mouth Folic Acid 1mg  a day by mouth Dietary Fiber 20-30 grams a day by mouth     Current Medications: 1)    Bayer Childrens Aspirin 81 Mg Chew (Aspirin) .... Take 1 tablet by mouth once a day 2)    Lisinopril-hydrochlorothiazide 20-25 Mg Tabs (Lisinopril-hydrochlorothiazide) .... Take 1 tab by mouth daily 3)    Glucotrol Xl 10 Mg Tb24 (Glipizide) .... One tab by mouth daily 4)    Zocor  80 Mg Tabs (Simvastatin) .... Take 1 tablet by mouth at bedtime 5)    Neurontin 600 Mg  Tabs (Gabapentin) .... One tab by mouth tid 6)    Lantus Solostar 100 Unit/ml Soln (Insulin glargine) .... Take 10 units q am; start this medicine after taking your class at the diabetes education center  If you have any questions, please call. We appreciate being able to work with you.   Sincerely,    [facility] Carin Hock MD

## 2010-12-28 NOTE — Progress Notes (Signed)
Summary: Optho appt made  Phone Note Outgoing Call   Call placed by: Enid Skeens, [place],  November 28, 2010 11:13 AM Call placed to: diabetic eye care  Summary of Call: Set up appt for pt. jan 13th @ 8:15am. for double vision Initial call taken by: Enid Skeens, CMA,  November 28, 2010 11:14 AM

## 2010-12-28 NOTE — Assessment & Plan Note (Signed)
Summary: eyes hurting and double vision/ls   Vital Signs:  Patient profile:   65 year old female Height:      62.5 inches Weight:      189 pounds BMI:     34.14 Temp:     98.4 degrees F oral Pulse rate:   72 / minute BP sitting:   131 / 71  (right arm) Cuff size:   regular  Vitals Entered By: Schuyler Amor CMA (November 22, 2010 8:59 AM) CC: headache. blurred and double vision, Headache Is Patient Diabetic? Yes Pain Assessment Patient in pain? yes     Location: head Intensity: 5  Vision Screening:Left eye w/o correction: 20 / 40 Right Eye w/o correction: 20 / 25 Both eyes w/o correction:  20/ 25        Vision Entered By: Schuyler Amor CMA (November 22, 2010 9:34 AM)   Primary Care Provider:  Suzanna Obey MD  CC:  headache. blurred and double vision and Headache.  History of Present Illness: HA: Pt reports HA over last 10 days. Mainly frontal. Persistent pressure like headache. No personal hx/o migraine. Daughter does have persistent migraines. Otherwise no family history per pt. No N/V. No rhinorrhea/nasal congestion. No focal neuro deficits or confusion. HA pain 6-8/10. Has also  noticed blurry vision and photophobia over the last 3-4 days. No direct blindness. HA pain relieved w/ rest and darkness. Has been taking ibuprofen 800 mg with moderate relief in pain.  Feel that she has been under alot of stress over the holidays.   Headache HPI:      There is no family history of migraine headaches.         Headache Treatment History:      She has tried NSAID's which were effective.     Allergies: 1)  ! Glucophage  Physical Exam  General:  alert.  in minimal distress  Head:  to temporal tenderness  Eyes:  vision grossly intact, pupils equal, pupils round, and pupils reactive to light.  + photophobia bilaterally. No strabsimus, nystagmus, scleral icterus, EOMI  Ears:  R TM: bulging with mild peripheral erythema Nose:  nares clear bilaterally  Mouth:  moist mucus  membranes,  Neck:  supple, full ROM  Lungs:  CTAB, no wheezes, rales, rhoncii  Heart:  RRR Abdomen:  S/NT/ND  Neurologic:  alert & oriented X3, cranial nerves II-XII intact, strength normal in all extremities, and gait normal.     Impression & Recommendations:  Problem # 1:  HEADACHE (ICD-784.0) Assessment New HA of somewhat unclear etiology. Though, there may component of OM contributing to picture.  Will send for MRI w/ and w/o contrast to rule out intracranial pathology given new onset. Eye exam WNL.  Will also check baseline labs as pt will recieve contrast for MRI in setting of baseline HTN/DM. Pt instructed to hold on use of ibuprofen pending MRI. Discussed neuro red flags at length with pt. Case precepted with Dr. Nori Riis. Pt instructed to followup with PCP next  for followup on HA.  Her updated medication list for this problem includes:    Bayer Childrens Aspirin 81 Mg Chew (Aspirin) .Marland Kitchen... Take 1 tablet by mouth once a day    Vicodin 5-500 Mg Tabs (Hydrocodone-acetaminophen) .Marland Kitchen... 1 tab by mouth three times a day as needed severe pain.  Problem # 2:  OTITIS MEDIA, ACUTE, RIGHT (ICD-382.9) Assessment: New Noted bulging/erythematous R TM. ? cause of HA. Will treat with augmentin.  Her updated medication list for this  problem includes:    Bayer Childrens Aspirin 81 Mg Chew (Aspirin) .Marland Kitchen... Take 1 tablet by mouth once a day    Augmentin 500-125 Mg Tabs (Amoxicillin-pot clavulanate) .Marland Kitchen... 1 tab by mouth two times a day for 7 days  Complete Medication List: 1)  Bayer Childrens Aspirin 81 Mg Chew (Aspirin) .... Take 1 tablet by mouth once a day 2)  Glucotrol Xl 10 Mg Tb24 (Glipizide) .... One tab by mouth daily (needs office visit for reflil) 3)  Zocor 80 Mg Tabs (Simvastatin) .... Take 1 tablet by mouth at bedtime 4)  Neurontin 600 Mg Tabs (Gabapentin) .... One tab by mouth tid 5)  Vaseretic 10-25 Mg Tabs (Enalapril-hydrochlorothiazide) .Marland Kitchen.. 1 tab by mouth daily for blood pressure 6)   Omeprazole 40 Mg Cpdr (Omeprazole) .Marland Kitchen.. 1 tab by mouth daily for stomach 7)  Cyclobenzaprine Hcl 5 Mg Tabs (Cyclobenzaprine hcl) .Marland Kitchen.. 1-2 tabs by mouth three times a day as needed muscle spasm 8)  Vicodin 5-500 Mg Tabs (Hydrocodone-acetaminophen) .Marland Kitchen.. 1 tab by mouth three times a day as needed severe pain. 9)  Augmentin 500-125 Mg Tabs (Amoxicillin-pot clavulanate) .Marland Kitchen.. 1 tab by mouth two times a day for 7 days  Other Orders: MRI with & without Contrast (MRI w&w/o Contrast) [facility] [id number]) Comp Met[facility] [id number])  Patient Instructions: 1)  I am sending you for an MRI of your brain to evaluate your headache 2)  I am also giving you a prescription for antibiotics as you also have an ear infection  3)  take tylenol instead of ibuprofen because you will be getting contrast with you MRI  4)  I will call you with the results of your MRI  5)  Follow up with me or Dr. Doreene Nest next week for your headache 6)  If you headache gets worse, give [place] a call or go to the ED  7)  Happy New Year and God Bless 8)  Shanda Howells MD  Prescriptions: AUGMENTIN 500-125 MG TABS (AMOXICILLIN-POT CLAVULANATE) 1 tab by mouth two times a day for 7 days  #14 x 0   Entered and Authorized by:   Shanda Howells MD   Signed by:   Shanda Howells MD on 11/22/2010   Method used:   Electronically to        [id number] (retail)       [place], [place]  [id number]       Ph: [phone]       Fax: [phone]   [id number]    Orders Added: 1)  MRI with & without Contrast [MRI w&w/o Contrast] 2)  [facility] [id number] 3)  Comp [facility] [id number]  Appended Document: eyes hurting and double vision/ls    Clinical Lists Changes  Orders: Added new Test order of [facility]- Est Level  3 [id number]) - Signed

## 2010-12-28 NOTE — Assessment & Plan Note (Signed)
Summary: diplopia/ls   Vital Signs:  Patient profile:   65 year old female Height:      62.5 inches Weight:      185.3 pounds BMI:     33.47 Temp:     98.2 degrees F oral Pulse rate:   74 / minute BP sitting:   144 / 72  (left arm) Cuff size:   regular  Vitals Entered By: Enid Skeens, CMA (November 28, 2010 9:56 AM) CC: HA & double vision x2 weeks Is Patient Diabetic? Yes Did you bring your meter with you today? No Pain Assessment Patient in pain? yes        Primary Care Provider:  Suzanna Obey MD  CC:  HA & double vision x2 weeks.  History of Present Illness: 65 yo here for follow-up of double vision.  Was seen by other provider last week for several week history of acute onset double vision with headache.  Nontraumatic.  CBC, CMET, MRI head had no acute findings.  Was also given course of augmentin for Acute otitis media, asymptomatic at the time.  Continues to have double vision, worsening.  No longer having headache.  Double vision is vertical, worse with leftward gaze.  Painless although feels "eyesockets are sore" with extreme gaze devaition.  Resolves with covering either eye.  No change with positioning head.  Habits & Providers  Alcohol-Tobacco-Diet     Tobacco Status: current  Problems Prior to Update: 1)  Headache  (ICD-784.0) 2)  Otitis Media, Acute, Right  (ICD-382.9) 3)  Diabetes Mellitus II, Uncomplicated  ([id number]) 4)  Hypercholesterolemia  (ICD-272.0) 5)  Hypertension, Benign Systemic  (ICD-401.1) 6)  Weight Loss  (ICD-783.21) 7)  Sciatica  (ICD-724.3) 8)  Degenerative Disc Disease  (ICD-722.6) 9)  Tobacco User  (ICD-305.1) 10)  Obesity  (ICD-278.00) 11)  Bursitis, Right Shoulder  (ICD-726.10) 12)  Lumbar Strain, Acute  (ICD-847.2) 13)  Herniated Lumbar Disc  (ICD-722.10)  Medications Prior to Update: 1)  Bayer Childrens Aspirin 81 Mg Chew (Aspirin) .... Take 1 Tablet By Mouth Once A Day 2)  Glucotrol Xl 10 Mg Tb24 (Glipizide) .... One Tab By  Mouth Daily (Needs Office Visit For Reflil) 3)  Zocor 80 Mg Tabs (Simvastatin) .... Take 1 Tablet By Mouth At Bedtime 4)  Neurontin 600 Mg  Tabs (Gabapentin) .... One Tab By Mouth Tid 5)  Vaseretic 10-25 Mg Tabs (Enalapril-Hydrochlorothiazide) .Marland Kitchen.. 1 Tab By Mouth Daily For Blood Pressure 6)  Omeprazole 40 Mg Cpdr (Omeprazole) .Marland Kitchen.. 1 Tab By Mouth Daily For Stomach 7)  Cyclobenzaprine Hcl 5 Mg Tabs (Cyclobenzaprine Hcl) .Marland Kitchen.. 1-2 Tabs By Mouth Three Times A Day As Needed Muscle Spasm 8)  Vicodin 5-500 Mg Tabs (Hydrocodone-Acetaminophen) .Marland Kitchen.. 1 Tab By Mouth Three Times A Day As Needed Severe Pain. 9)  Augmentin 500-125 Mg Tabs (Amoxicillin-Pot Clavulanate) .Marland Kitchen.. 1 Tab By Mouth Two Times A Day For 7 Days  Current Medications (verified): 1)  Bayer Childrens Aspirin 81 Mg Chew (Aspirin) .... Take 1 Tablet By Mouth Once A Day 2)  Glucotrol Xl 10 Mg Tb24 (Glipizide) .... One Tab By Mouth Daily (Needs Office Visit For Reflil) 3)  Zocor 80 Mg Tabs (Simvastatin) .... Take 1 Tablet By Mouth At Bedtime 4)  Neurontin 600 Mg  Tabs (Gabapentin) .... One Tab By Mouth Tid 5)  Vaseretic 10-25 Mg Tabs (Enalapril-Hydrochlorothiazide) .Marland Kitchen.. 1 Tab By Mouth Daily For Blood Pressure 6)  Omeprazole 40 Mg Cpdr (Omeprazole) .Marland Kitchen.. 1 Tab By Mouth Daily For Stomach 7)  Cyclobenzaprine Hcl 5 Mg Tabs (Cyclobenzaprine Hcl) .Marland Kitchen.. 1-2 Tabs By Mouth Three Times A Day As Needed Muscle Spasm 8)  Vicodin 5-500 Mg Tabs (Hydrocodone-Acetaminophen) .Marland Kitchen.. 1 Tab By Mouth Three Times A Day As Needed Severe Pain. 9)  Augmentin 500-125 Mg Tabs (Amoxicillin-Pot Clavulanate) .Marland Kitchen.. 1 Tab By Mouth Two Times A Day For 7 Days  Allergies: 1)  ! Glucophage  Past History:  Past Medical History: Last updated: 11/09/2009 Current Problems:  CHEST PAIN (ICD-786.50) HYPERTENSION, BENIGN SYSTEMIC (ICD-401.1) SCIATICA (ICD-724.3) DEGENERATIVE DISC DISEASE (ICD-722.6) TOBACCO USER (ICD-305.1) OBESITY (ICD-278.00) BURSITIS, RIGHT SHOULDER  (ICD-726.10) LUMBAR STRAIN, ACUTE (ICD-847.2) HEALTHY ADULT FEMALE (ICD-V70.0) HERNIATED LUMBAR DISC (ICD-722.10) HYPERCHOLESTEROLEMIA (ICD-272.0) DIABETES MELLITUS II, UNCOMPLICATED ([id number]) CATARACT (ICD-366.9)    ekg - wnl - 04/22/2003  Past Surgical History: Last updated: 12/04/2007 s/p Hysterectomy & BSO for benign reasons  Family History: Last updated: 10/04/2008 Mother-DM, Pt. 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 Tobacco-quit 12/03 restarted late 2006 quit 2007, restarted again. ;no ETOH, no illicit drugs; [employer] religion PMH-FH-SH reviewed for relevance  Review of Systems      See HPI  Physical Exam  General:  Vitals reviewed.  NAD. Head:  [place]/AT.  no tenderness to palpatino of tempels.  sensation to light touch intact.  No  ash. Eyes:  EOMI.  PERLLA.  No conjunctivitis.  Vision 20/25 OS and OD.  20/70 OU.  No ptosis but?  Mild edema of left eyelid. Ears:  NO TM erythema or effusion.   Impression & Recommendations:  Problem # 1:  DIPLOPIA (ICD-368.2) Known diabetic retinopathy  Will add on TSH and ESR.  Given history/physical, and MRI findings, possibly extraocular musculature in nature but differential also to inclue migraine, myasthenia gravis.  WIl refer to opthalmology.   Offered appointment today but patient cannot make it.  Made patietn next available appointment in approx 1 week.  Given insturctions to call for changing symptoms. Orders: TSH-[facility] [id number]) Sed Rate (ESR)-[facility] [id number]) [place]- Est Level  3 [id number])  Complete Medication List: 1)  Bayer Childrens Aspirin 81 Mg Chew (Aspirin) .... Take 1 tablet by mouth once a day 2)  Glucotrol Xl 10 Mg Tb24 (Glipizide) .... One tab by mouth daily (needs office visit for reflil) 3)  Zocor 80 Mg Tabs (Simvastatin) .... Take 1 tablet by mouth at bedtime 4)  Neurontin 600 Mg Tabs  (Gabapentin) .... One tab by mouth tid 5)  Vaseretic 10-25 Mg Tabs (Enalapril-hydrochlorothiazide) .Marland Kitchen.. 1 tab by mouth daily for blood pressure 6)  Omeprazole 40 Mg Cpdr (Omeprazole) .Marland Kitchen.. 1 tab by mouth daily for stomach 7)  Cyclobenzaprine Hcl 5 Mg Tabs (Cyclobenzaprine hcl) .Marland Kitchen.. 1-2 tabs by mouth three times a day as needed muscle spasm 8)  Vicodin 5-500 Mg Tabs (Hydrocodone-acetaminophen) .Marland Kitchen.. 1 tab by mouth three times a day as needed severe pain.  Patient Instructions: 1)  Your MRI was normal.  Will get labs today 2)  Will refer you to opthlamology for your double vision.   Orders Added: 1)  TSH-[facility] [id number] 2)  Sed Rate (ESR)-[facility] [[id number]] 3)  [facility]- Est Level  3 [[id number]]  Appended Document: Sedrate  45 mm/hr    Lab Visit  Laboratory Results   Blood Tests   Date/Time Received: November 28, 2010 11:17 AM  Date/Time Reported: November 28, 2010 12:43 PM   SED rate: 45 mm/hr  Comments: ...............test performed by......Marland KitchenBonnie A. Martinique, MLS (  ASCP)cm    Orders Today:  Mildy elevated.  No change in workup at this time. Suzanna Obey MD  November 28, 2010 1:40 PM

## 2011-05-04 ENCOUNTER — Telehealth: Payer: Self-pay | Admitting: Family Medicine

## 2011-05-04 ENCOUNTER — Other Ambulatory Visit: Payer: Self-pay | Admitting: Family Medicine

## 2011-05-04 NOTE — Telephone Encounter (Signed)
Refill request

## 2011-05-04 NOTE — Telephone Encounter (Signed)
Refill for Glipizide, enamapril - [facility] on [address]

## 2011-05-07 MED ORDER — GLIPIZIDE ER 10 MG PO TB24: 10.0000 mg | ORAL_TABLET | Freq: Every day | ORAL | Status: DC

## 2011-05-07 MED ORDER — ENALAPRIL-HYDROCHLOROTHIAZIDE 10-25 MG PO TABS: 1.0000 | ORAL_TABLET | Freq: Every day | ORAL | Status: DC

## 2011-05-07 NOTE — Telephone Encounter (Signed)
LVM for patient to call back to inform of below 

## 2011-05-07 NOTE — Telephone Encounter (Signed)
Refilled patients meds for two months.  Please have make appt to meet new doctor in July

## 2011-05-07 NOTE — Telephone Encounter (Signed)
Patient not on chronic narcotics.  Needs to make appt to meet new MD in July.

## 2011-06-04 ENCOUNTER — Ambulatory Visit (INDEPENDENT_AMBULATORY_CARE_PROVIDER_SITE_OTHER): Payer: Medicare Other | Admitting: Family Medicine

## 2011-06-04 ENCOUNTER — Encounter: Payer: Self-pay | Admitting: Family Medicine

## 2011-06-04 VITALS — BP 139/76 | HR 76 | Temp 98.5°F | Wt 197.0 lb

## 2011-06-04 DIAGNOSIS — I10 Essential (primary) hypertension: Secondary | ICD-10-CM

## 2011-06-04 DIAGNOSIS — R300 Dysuria: Secondary | ICD-10-CM

## 2011-06-04 DIAGNOSIS — M5126 Other intervertebral disc displacement, lumbar region: Secondary | ICD-10-CM

## 2011-06-04 DIAGNOSIS — E7800 Pure hypercholesterolemia, unspecified: Secondary | ICD-10-CM

## 2011-06-04 DIAGNOSIS — E119 Type 2 diabetes mellitus without complications: Secondary | ICD-10-CM

## 2011-06-04 DIAGNOSIS — F17200 Nicotine dependence, unspecified, uncomplicated: Secondary | ICD-10-CM

## 2011-06-04 LAB — POCT URINALYSIS DIPSTICK
Bilirubin, UA: NEGATIVE
Blood, UA: NEGATIVE
Glucose, UA: >=1000
Ketones, UA: NEGATIVE
Leukocytes, UA: NEGATIVE
Nitrite, UA: NEGATIVE
Protein, UA: NEGATIVE
Spec Grav, UA: 1.015
Urobilinogen, UA: 1
pH, UA: 5

## 2011-06-04 LAB — POCT GLYCOSYLATED HEMOGLOBIN (HGB A1C): Hemoglobin A1C: 9.6

## 2011-06-04 MED ORDER — HYDROCODONE-ACETAMINOPHEN 5-500 MG PO TABS: 1.0000 | ORAL_TABLET | Freq: Three times a day (TID) | ORAL | Status: DC | PRN

## 2011-06-04 MED ORDER — SIMVASTATIN 80 MG PO TABS: 80.0000 mg | ORAL_TABLET | Freq: Every day | ORAL | Status: DC

## 2011-06-04 MED ORDER — ENALAPRIL-HYDROCHLOROTHIAZIDE 10-25 MG PO TABS: 1.0000 | ORAL_TABLET | Freq: Every day | ORAL | Status: DC

## 2011-06-04 MED ORDER — GLIPIZIDE ER 10 MG PO TB24: 10.0000 mg | ORAL_TABLET | Freq: Every day | ORAL | Status: DC

## 2011-06-04 NOTE — Progress Notes (Signed)
  Subjective:    Patient ID: Kimberly Carlson, female    DOB: [birth year], 65 y.o.   MRN: [id number]  HPI Pt with Hx of DMT2, HTN, HLP, Chronic Back pain (Hx of Herniated lumbar disk and sciatica) that comes to f/u her chronic conditions and complains of dysuria.  1. DM pt has been eating more than usual. Has gained weight. Taking her glipizide. 2. Back pain unchanged, left sided sciatica no bowel or bladder incontinence, no saddle anesthesia. Pt continues to get relief with same dose and frequency of Vicodin.   Review of Systems  Constitutional: Positive for appetite change.       Weight increased related to poor diet.  Eyes: Negative for visual disturbance.  Respiratory: Negative for chest tightness and shortness of breath.   Cardiovascular: Negative for chest pain.  Musculoskeletal: Positive for back pain.  Neurological: Negative for numbness and headaches.   No fevers or chills.     Objective:   Physical Exam  Constitutional: She appears well-developed.  HENT:  Head: Normocephalic and atraumatic.  Right Ear: External ear normal.  Left Ear: External ear normal.  Eyes: Pupils are equal, round, and reactive to light.  Neck: Normal range of motion. Neck supple.  Cardiovascular: Normal rate, regular rhythm and normal heart sounds.   No murmur heard. Pulmonary/Chest: Breath sounds normal. She has no wheezes. She has no rales.  Abdominal: Soft. Bowel sounds are normal. She exhibits no distension. There is no tenderness.       No CVT tenderness  Musculoskeletal: She exhibits no tenderness.       Tenderness to palpation on lumbar spine and left lower extremity.  Lymphadenopathy:    She has no cervical adenopathy.  Foot exam: No lesions seen on her feet. Sensation grossly intact. No edema        Assessment & Plan:

## 2011-06-04 NOTE — Patient Instructions (Addendum)
Was a pleasure to meet you. Remember our goals regarding your weight, blood pressure and blood sugars. And our pain medication contract.

## 2011-06-06 ENCOUNTER — Encounter: Payer: Self-pay | Admitting: Family Medicine

## 2011-06-06 NOTE — Assessment & Plan Note (Signed)
Quit smoking 2 month ago. Gave her encouragement.

## 2011-06-06 NOTE — Assessment & Plan Note (Signed)
Pt with unchanged back pain. Started under pain contract today. Plan for UDS next visit.

## 2011-06-06 NOTE — Assessment & Plan Note (Signed)
Worsened A1C. Increased  Weight. Glucosuria. Addressed weight change. Will reassess in three months.

## 2011-06-06 NOTE — Assessment & Plan Note (Signed)
Once she loses weight will check lipid panel.

## 2011-06-06 NOTE — Assessment & Plan Note (Signed)
Controlled. Pt on ACEI we will check renal function next visit.

## 2011-08-24 LAB — URINE MICROSCOPIC-ADD ON

## 2011-08-24 LAB — URINALYSIS, ROUTINE W REFLEX MICROSCOPIC
Bilirubin Urine: NEGATIVE
Glucose, UA: NEGATIVE
Hgb urine dipstick: NEGATIVE
Ketones, ur: NEGATIVE
Nitrite: NEGATIVE
Protein, ur: NEGATIVE
Specific Gravity, Urine: 1.012
Urobilinogen, UA: 0.2
pH: 5

## 2011-08-24 LAB — POCT I-STAT, CHEM 8
BUN: 17
Calcium, Ion: 1.09 — ABNORMAL LOW
Chloride: 103
Creatinine, Ser: 1
Glucose, Bld: 215 — ABNORMAL HIGH
HCT: 36
Hemoglobin: 12.2
Potassium: 4
Sodium: 135
TCO2: 26

## 2011-08-24 LAB — POCT CARDIAC MARKERS
CKMB, poc: 4.9
Myoglobin, poc: 144
Operator id: 294341
Troponin i, poc: <0.05

## 2011-09-25 ENCOUNTER — Other Ambulatory Visit: Payer: Self-pay | Admitting: Family Medicine

## 2011-09-25 MED ORDER — HYDROCODONE-ACETAMINOPHEN 5-500 MG PO TABS: 1.0000 | ORAL_TABLET | Freq: Three times a day (TID) | ORAL | Status: DC | PRN

## 2011-09-25 MED ORDER — OMEPRAZOLE 40 MG PO CPDR: 40.0000 mg | DELAYED_RELEASE_CAPSULE | Freq: Every day | ORAL | Status: DC

## 2011-11-05 ENCOUNTER — Other Ambulatory Visit: Payer: Self-pay | Admitting: Family Medicine

## 2011-11-05 ENCOUNTER — Other Ambulatory Visit: Payer: Self-pay | Admitting: *Deleted

## 2011-11-05 DIAGNOSIS — I10 Essential (primary) hypertension: Secondary | ICD-10-CM

## 2011-11-05 MED ORDER — ENALAPRIL-HYDROCHLOROTHIAZIDE 10-25 MG PO TABS: 1.0000 | ORAL_TABLET | Freq: Every day | ORAL | Status: DC

## 2011-11-05 MED ORDER — GLIPIZIDE ER 10 MG PO TB24: 10.0000 mg | ORAL_TABLET | Freq: Every day | ORAL | Status: DC

## 2011-11-05 MED ORDER — HYDROCODONE-ACETAMINOPHEN 5-500 MG PO TABS
1.0000 | ORAL_TABLET | Freq: Three times a day (TID) | ORAL | Status: DC | PRN
Start: 2011-11-05 — End: 2011-12-21

## 2011-12-07 ENCOUNTER — Telehealth: Payer: Self-pay | Admitting: *Deleted

## 2011-12-07 DIAGNOSIS — I10 Essential (primary) hypertension: Secondary | ICD-10-CM

## 2011-12-07 MED ORDER — GLIPIZIDE ER 10 MG PO TB24: 10.0000 mg | ORAL_TABLET | Freq: Every day | ORAL | Status: DC

## 2011-12-07 MED ORDER — ENALAPRIL-HYDROCHLOROTHIAZIDE 10-25 MG PO TABS: 1.0000 | ORAL_TABLET | Freq: Every day | ORAL | Status: DC

## 2011-12-07 NOTE — Telephone Encounter (Signed)
Patient has an appointment scheduled on 12/21/2011 with Dr. Thomes Dinning.  Sent in enough pills to get her to this appointment.  Lauralyn Primes

## 2011-12-21 ENCOUNTER — Ambulatory Visit (INDEPENDENT_AMBULATORY_CARE_PROVIDER_SITE_OTHER): Payer: Medicare Other | Admitting: Family Medicine

## 2011-12-21 ENCOUNTER — Encounter: Payer: Self-pay | Admitting: Family Medicine

## 2011-12-21 VITALS — BP 154/73 | HR 93 | Temp 98.8°F | Ht 63.0 in | Wt 199.0 lb

## 2011-12-21 DIAGNOSIS — I10 Essential (primary) hypertension: Secondary | ICD-10-CM | POA: Diagnosis not present

## 2011-12-21 DIAGNOSIS — M5126 Other intervertebral disc displacement, lumbar region: Secondary | ICD-10-CM

## 2011-12-21 DIAGNOSIS — E119 Type 2 diabetes mellitus without complications: Secondary | ICD-10-CM

## 2011-12-21 DIAGNOSIS — E7800 Pure hypercholesterolemia, unspecified: Secondary | ICD-10-CM | POA: Diagnosis not present

## 2011-12-21 LAB — POCT GLYCOSYLATED HEMOGLOBIN (HGB A1C): Hemoglobin A1C: 9.9

## 2011-12-21 MED ORDER — HYDROCODONE-ACETAMINOPHEN 5-500 MG PO TABS: 1.0000 | ORAL_TABLET | Freq: Three times a day (TID) | ORAL | Status: AC | PRN

## 2011-12-22 MED ORDER — SIMVASTATIN 80 MG PO TABS: 80.0000 mg | ORAL_TABLET | Freq: Every day | ORAL | Status: DC

## 2011-12-22 MED ORDER — GLIPIZIDE ER 10 MG PO TB24: 10.0000 mg | ORAL_TABLET | Freq: Every day | ORAL | Status: DC

## 2011-12-22 MED ORDER — ENALAPRIL-HYDROCHLOROTHIAZIDE 10-25 MG PO TABS: 1.0000 | ORAL_TABLET | Freq: Every day | ORAL | Status: DC

## 2011-12-22 NOTE — Assessment & Plan Note (Addendum)
Pt A1C  On July/2012 was 9.6. Pt takes glipizide and reports she is compliant. A1C today 9.9. We are concern a bout compliance and/or need of insulin at this point. Pt not very understandable of risks of uncontrolled diabetes. Plan: Office visit only for DM assessment, and pt education.

## 2011-12-22 NOTE — Assessment & Plan Note (Addendum)
Pt is functional with PRN Vicodin. She has been with this medication for a while with no need to increase dose.  Plan: 1.Continue as long as this medication helps to relieve the symptoms of her chronic condition and no signs of abuse or miss use are found. Next f/u in 3 months. 2.UDS on next lab appointment. Future order placed

## 2011-12-22 NOTE — Progress Notes (Signed)
  Subjective:    Patient ID: Kimberly Carlson, female    DOB: 02-Oct-1946, 66 y.o.   MRN: [id number]  HPI Pt that comes today complaining of back pain and needed refill of her pain medications. Pt is under narcotic pain medication contract and last time precription was filled was in December 08/2011. Her past office visit was in July/2012.  #1 Disk Herniation: Pain controlled with vicodin. No needed to escalate dose or type of narcotic medication. She is functional taking this PRN. No side effects reported. Not taking Flexeryl and Gabapentin. No fecal or urinary incontinency.   #2 Pt is not very receptive with DM management and goals for A1C. She says her "sugars are fine". She takes Glipizide and has no complaints of side effects. #3.BP she reports been compliant with medication. No symptoms reported, no side effects of Enalapril/HCTZ.     #4. HLP: takes her simvastatin "every night". No side effects noticed.  Review of Systems  Constitutional: Negative for fever, chills, activity change, appetite change, fatigue and unexpected weight change.  HENT: Negative for congestion and neck pain.   Eyes: Negative for visual disturbance.  Respiratory: Negative for shortness of breath.   Cardiovascular: Negative for chest pain, palpitations and leg swelling.  Gastrointestinal: Negative.   Genitourinary: Negative.   Musculoskeletal: Positive for back pain. Negative for joint swelling and gait problem.       Moderated to severe that responds to narcotic analgesia.  Neurological: Negative for dizziness, weakness, numbness and headaches.       Objective:   Physical Exam  Constitutional: She is oriented to person, place, and time. No distress.  HENT:  Mouth/Throat: Oropharynx is clear and moist.  Eyes: Conjunctivae and EOM are normal. Pupils are equal, round, and reactive to light.  Neck: Neck supple. No thyromegaly present.  Cardiovascular: Normal rate, regular rhythm, normal heart sounds and  intact distal pulses.   No murmur heard. Pulmonary/Chest: Effort normal and breath sounds normal. No respiratory distress.  Abdominal: Soft. Bowel sounds are normal. She exhibits no distension. There is no tenderness.  Musculoskeletal: She exhibits no edema.       Limited ROM of lower extremities at pelvic level due to reproducible pain. No saddle anesthesia. Leg raise positive bilaterally? With no gait abnormality.  Lymphadenopathy:    She has no cervical adenopathy.  Neurological: She is alert and oriented to person, place, and time. She has normal reflexes.          Assessment & Plan:  .f

## 2011-12-22 NOTE — Assessment & Plan Note (Addendum)
Elevated Blood pressure at this visit 154/73 manually at the end of the visit was 145/70. Still moderately elevated. Pt on Enalapril-HCTZ (10-25)  Plan: Record BP several times a week and reevaluate the need of changing therapy in next office visit. Definitively more close f/u that an office visit every 6 months as she has had in the past.

## 2011-12-22 NOTE — Assessment & Plan Note (Signed)
Last lipid profile in 2011. None in 2012. Pt states she is compliant with statin and take it "every night". No secondary effects reported.  Plan: Lipid profile. Order placed as future, will evaluate the results with pt.

## 2011-12-26 NOTE — Patient Instructions (Signed)
It has been a pleasure to see you today. Please take the medications as prescribed.  We need to get labs to evaluate your sugar, cholesterol and kidney/liver function I will call you with the labs results if they come back abnormal otherwise you will receive a letter.  Make an appointment just for your Diabetes. As we discussed your A1C are very elevated and this put your health at risk.  In order to prescribe your pain medication I need to see you and evaluate your back every 3 months otherwise I will not be able to prescribe your medication. Please bring your medication container at this visit.

## 2011-12-28 ENCOUNTER — Other Ambulatory Visit: Payer: Medicare Other

## 2011-12-28 DIAGNOSIS — E7800 Pure hypercholesterolemia, unspecified: Secondary | ICD-10-CM | POA: Diagnosis not present

## 2011-12-28 LAB — LIPID PANEL
Cholesterol: 197 mg/dL (ref 0–200)
HDL: 76 mg/dL (ref >39)
LDL Cholesterol: 108 mg/dL — ABNORMAL HIGH (ref 0–99)
Total CHOL/HDL Ratio: 2.6 Ratio
Triglycerides: 66 mg/dL (ref <150)
VLDL: 13 mg/dL (ref 0–40)

## 2012-04-04 ENCOUNTER — Encounter: Payer: Self-pay | Admitting: Family Medicine

## 2012-04-04 ENCOUNTER — Ambulatory Visit (INDEPENDENT_AMBULATORY_CARE_PROVIDER_SITE_OTHER): Payer: Medicare Other | Admitting: Family Medicine

## 2012-04-04 VITALS — BP 150/82 | HR 84 | Temp 99.1°F | Ht 63.0 in | Wt 196.6 lb

## 2012-04-04 DIAGNOSIS — E119 Type 2 diabetes mellitus without complications: Secondary | ICD-10-CM

## 2012-04-04 DIAGNOSIS — Z5189 Encounter for other specified aftercare: Secondary | ICD-10-CM | POA: Diagnosis not present

## 2012-04-04 DIAGNOSIS — R52 Pain, unspecified: Secondary | ICD-10-CM

## 2012-04-04 DIAGNOSIS — I10 Essential (primary) hypertension: Secondary | ICD-10-CM | POA: Diagnosis not present

## 2012-04-04 DIAGNOSIS — E7800 Pure hypercholesterolemia, unspecified: Secondary | ICD-10-CM | POA: Diagnosis not present

## 2012-04-04 DIAGNOSIS — M5126 Other intervertebral disc displacement, lumbar region: Secondary | ICD-10-CM

## 2012-04-04 LAB — COMPREHENSIVE METABOLIC PANEL
ALT: 15 U/L (ref 0–35)
AST: 17 U/L (ref 0–37)
Albumin: 4.3 g/dL (ref 3.5–5.2)
Alkaline Phosphatase: 105 U/L (ref 39–117)
BUN: 22 mg/dL (ref 6–23)
CO2: 27 mEq/L (ref 19–32)
Calcium: 10.1 mg/dL (ref 8.4–10.5)
Chloride: 101 mEq/L (ref 96–112)
Creat: 0.94 mg/dL (ref 0.50–1.10)
Glucose, Bld: 184 mg/dL — ABNORMAL HIGH (ref 70–99)
Potassium: 4.1 mEq/L (ref 3.5–5.3)
Sodium: 138 mEq/L (ref 135–145)
Total Bilirubin: 0.4 mg/dL (ref 0.3–1.2)
Total Protein: 7.1 g/dL (ref 6.0–8.3)

## 2012-04-04 LAB — TSH: TSH: 0.716 u[IU]/mL (ref 0.350–4.500)

## 2012-04-04 LAB — POCT GLYCOSYLATED HEMOGLOBIN (HGB A1C): Hemoglobin A1C: 9.8

## 2012-04-04 MED ORDER — HYDROCODONE-ACETAMINOPHEN 5-325 MG PO TABS
1.0000 | ORAL_TABLET | Freq: Three times a day (TID) | ORAL | Status: DC | PRN
Start: 2012-04-04 — End: 2012-04-05

## 2012-04-04 MED ORDER — SIMVASTATIN 80 MG PO TABS: 80.0000 mg | ORAL_TABLET | Freq: Every day | ORAL | Status: DC

## 2012-04-04 MED ORDER — GLIPIZIDE ER 10 MG PO TB24: 10.0000 mg | ORAL_TABLET | Freq: Every day | ORAL | Status: DC

## 2012-04-04 MED ORDER — ENALAPRIL-HYDROCHLOROTHIAZIDE 10-25 MG PO TABS: 1.0000 | ORAL_TABLET | Freq: Every day | ORAL | Status: DC

## 2012-04-04 NOTE — Patient Instructions (Addendum)
It has been a pleasure to see you today. Please take the medications as prescribed. The is a new medication called Amlodipine. Please take it as prescribed. The simvastatin was reduced due to the addition of this new medication. Make your next appointment in 1 month

## 2012-04-04 NOTE — Progress Notes (Signed)
  Subjective:    Patient ID: Kimberly Carlson, female    DOB: [birth year], 66 y.o.   MRN: [id number]  HPI Pt comes today for DM f/u. She was priorly seen on Jan this year. Her A1C at that point was 9.9.  #1 Pt is not very receptive with DM management and goals for A1C. She takes Glipizide and has no complaints of side effects.  #2.BP she reports been compliant with medication. No symptoms reported, no side effects of Enalapril/HCTZ. BP still elevated to [id number] systolic besides tx. #3. HLP: takes her simvastatin "every night". No side effects noticed. Last lipid profile WNL except LDL on 108 #4. Back pain: only on PRN hydrocodone 5/325 (30 tab in the last 3 months). No escalation required, no tolerance developed, no signs of abuse/or misuse. Seems to help with functioning. UDS collected today.  Review of Systems Constitutional: Negative for fever, chills, activity change, appetite change, fatigue and unexpected weight change.  HENT: Negative for congestion and neck pain.  Eyes: Negative for visual disturbance.  Respiratory: Negative for shortness of breath.  Cardiovascular: Negative for chest pain, palpitations and leg swelling.  Gastrointestinal: Negative.  Genitourinary: Negative.  Musculoskeletal: Positive for back pain. Negative for joint swelling and gait problem.  Moderated to severe that responds to narcotic analgesia.  Neurological: Negative for dizziness, weakness, numbness and headaches.      Objective:   Physical Exam Constitutional: She is oriented to person, place, and time. No distress.  HENT:  Mouth/Throat: Oropharynx is clear and moist.  Eyes: Conjunctivae and EOM are normal. Pupils are equal, round, and reactive to light. Fundoscopy with no hemorrhages, exudates or papilledema.  Neck: Neck supple. No thyromegaly present.  Cardiovascular: Normal rate, regular rhythm, normal heart sounds and intact distal pulses. No murmur heard.  Pulmonary/Chest: Effort normal and breath  sounds normal. No respiratory distress.  Abdominal: Soft. Bowel sounds are normal. She exhibits no distension. There is no tenderness.  Musculoskeletal: She exhibits no edema.  Limited ROM of lower extremities at pelvic level due to reproducible pain. No saddle anesthesia. Leg raise positive bilaterally? With no gait abnormality.  Lymphadenopathy: She has no cervical adenopathy.  Neurological: She is alert and oriented to person, place, and time. She has normal reflexes. Sensorial intact as well as strength that is 5/5 symmetric. Normal coordination, finger to nose, finger to finger. Normal gait.    Assessment & Plan:

## 2012-04-05 LAB — DRUG SCREEN, URINE
Amphetamine Screen, Ur: NEGATIVE
Barbiturate Quant, Ur: NEGATIVE
Benzodiazepines.: NEGATIVE
Cocaine Metabolites: POSITIVE — AB
Creatinine,U: 45.34 mg/dL
Marijuana Metabolite: NEGATIVE
Methadone: NEGATIVE
Opiates: NEGATIVE
Phencyclidine (PCP): NEGATIVE
Propoxyphene: NEGATIVE

## 2012-04-05 MED ORDER — AMLODIPINE BESYLATE 5 MG PO TABS: 5.0000 mg | ORAL_TABLET | Freq: Every day | ORAL | Status: DC

## 2012-04-05 MED ORDER — SIMVASTATIN 40 MG PO TABS: 40.0000 mg | ORAL_TABLET | Freq: Every day | ORAL | Status: DC

## 2012-04-05 NOTE — Assessment & Plan Note (Signed)
Elevated Blood pressure  Pt on Enalapril-HCTZ (10-25). Cr WNL. Will add Amlodipine and close f/u  Plan:  Record BP several times a week and reevaluate in one month.

## 2012-04-05 NOTE — Progress Notes (Signed)
Addended by: Westley Hummer on: 04/05/2012 04:12 PM   Modules accepted: Orders

## 2012-04-05 NOTE — Assessment & Plan Note (Addendum)
Lipid profile performed LDL 108. Pt on Simvastatin 80 mg. Dose of simvastatin reduced in half since we started on Amlodipine and this medication increase the effects of simvastatin as well as side effects.  Plan: Side effects discussed. Will monitor. Pt also will try to work on dietetic changes.

## 2012-04-05 NOTE — Assessment & Plan Note (Signed)
Pt A1C's has been 9.6,  9.9, today 9.8. Pt takes glipizide and reports compliance and denies side effects. Pt not very understandable of risks of uncontrolled diabetes. I had a long conversation with pt to educate her about her diseases and the risk of elevated glucose. Insuline as next step on her management was discussed in depth, pt refuses it at this time. She will try diet since she admits has been abusing of fried and sweet food. We clarified that probably diet is not going to control her glucose at this point. Plan:  Continue on glipizide. F/u in 1 month for her HTN and CBG's. Will reevaluate and propose tx accordingly

## 2012-04-05 NOTE — Assessment & Plan Note (Addendum)
Pt is functional with PRN Vicodin. She has been with this medication for a while with no need to increase dose. No apparent signs of abuse or misuse. Plan:  1.Continue as long as this medication helps to relieve the symptoms of her chronic condition and no signs of abuse or misuse are found. Next f/u as needed. 2.UDS pending results. Addendum to this note on 04/05/2012. UDS recived 04/05/2012 pt positive for cocaine metabolites and negative for opioids that she is supposedly taken.  Pt has violated the pain contract established with [place]. I personally called the pharmacy to prevent pt from filling the prescription of hydrocodone 5/325 given yesterday .Pharmacy will destroy paper script. Pt will no longer receive narcotic medication from our clinic. No risks of withdraw or since pt was urine negative for opioid.

## 2012-04-07 ENCOUNTER — Telehealth: Payer: Self-pay | Admitting: Family Medicine

## 2012-04-07 NOTE — Telephone Encounter (Signed)
Spoke with pt and informed her that her UDS was positive for cocaine. Pt insisted that she has never used any type of illicit drugs. She asked if the test could be wrong? I told her that we have not had any issues with this type of testing in the past. Pt is asking if she can be retested. I told her that I would have to forward this to her pcp for follow up.Kimberly Carlson [place]

## 2012-04-07 NOTE — Telephone Encounter (Signed)
Pt is asking why the doctor cancelled script for her Vicodin?

## 2012-04-08 ENCOUNTER — Telehealth: Payer: Self-pay | Admitting: Family Medicine

## 2012-04-08 NOTE — Telephone Encounter (Signed)
Called pt and spoke with her. She was upset initially denying drug abuse of any kind. We have a good long conversation about her pain contract and the UDS results found. She did not precise when was the last time she took opioid, and could no understand why test was cocaine positive. She has an appointment on June 10th with me and we agreed to discuss this further at that time. Pt was very receptive and communicate appropriately during this phone conversation.

## 2012-04-08 NOTE — Telephone Encounter (Addendum)
Email to [facility] from Ms [facility]  First Name Kimberly  Last Name Rebeca Carlson  Facility/Hospital Where Treated [facility]  In Reference To Complaint  Email Address [email or web address]  [address] Address [address] [place]  ZIP Code [id number]  Telephone [phone]  Comment I went to see Dr. Thomes Dinning on Friday for a three month check-up. She wrote me my usual prescriptions which included Vicotin. When I went to get it filled the pharmacy told me that she had called and cancelled it. When I called the Dr. to find out why they said my urine had tested positive for cocaine. I have never done any drugs in my 61 years of life. I am in shock and I want to get this matter solved. I would like to take another test because there has been a terrible mistake made and I have been branded a drug addict and I don't like it at all. I will get to the bottom of this. I have been waiting on Dr. Thomes Dinning to call me all day and she hasn't called me yet. Can you please help me with this matter because I do not want to be labled a drug addict. I will go to any length to clear my name.

## 2012-05-05 ENCOUNTER — Encounter: Payer: Self-pay | Admitting: Family Medicine

## 2012-05-05 ENCOUNTER — Ambulatory Visit (INDEPENDENT_AMBULATORY_CARE_PROVIDER_SITE_OTHER): Payer: Medicare Other | Admitting: Family Medicine

## 2012-05-05 VITALS — BP 139/72 | HR 77 | Ht 63.0 in | Wt 195.0 lb

## 2012-05-05 DIAGNOSIS — I10 Essential (primary) hypertension: Secondary | ICD-10-CM

## 2012-05-05 DIAGNOSIS — M5126 Other intervertebral disc displacement, lumbar region: Secondary | ICD-10-CM

## 2012-05-05 DIAGNOSIS — H938X1 Other specified disorders of right ear: Secondary | ICD-10-CM

## 2012-05-05 DIAGNOSIS — H938X9 Other specified disorders of ear, unspecified ear: Secondary | ICD-10-CM | POA: Diagnosis not present

## 2012-05-05 DIAGNOSIS — E119 Type 2 diabetes mellitus without complications: Secondary | ICD-10-CM

## 2012-05-05 DIAGNOSIS — R259 Unspecified abnormal involuntary movements: Secondary | ICD-10-CM | POA: Diagnosis not present

## 2012-05-05 DIAGNOSIS — G252 Other specified forms of tremor: Secondary | ICD-10-CM

## 2012-05-05 MED ORDER — MELOXICAM 7.5 MG PO TABS: 7.5000 mg | ORAL_TABLET | Freq: Every day | ORAL | Status: DC

## 2012-05-05 NOTE — Patient Instructions (Addendum)
It has been a pleasure to see you today. Please take the medications as prescribed. Meloxicam is 1 tablet daily. Remember do not take ibuprofen while taking meloxicam. Make an appointment for Geriatric clinic and  Audiometry testing

## 2012-05-06 NOTE — Progress Notes (Signed)
  Subjective:    Patient ID: Kimberly Carlson, female    DOB: 1945/12/26, 66 y.o.   MRN: [id number]  HPI Pt that comes today for DM and HTN f/u. Her complaints today are persistent headache and fullness of right ear. She also complains of intermittent unintentional resting tremor of her jaw and right arm as well as decreased of short term memory. 1. Headache and right ear fullness: HA is on the left side, does not irradiates, no eye involvement. No nausea/vomiting associated.  Improves with ibuprofen but does not resolves completely. Right ear fullness is constant, helps to occlude ear canal  with cotton ball. No vertigo/dizziness. No hx of trauma or drainage. No difference in hearing with loud vs quiet backgrounds. 2. Intermittent non-intentional jaw and right hand  tremor. No rigidity. No difficulty walking. She has had problems with memory in the past but has not noticed any dramatic change lately. 3. HTN: controlled. Amlodipine well tolerated no side effects reported. 4. DM: A1C elevated. Discussed in length. Possible insuline but pt wants to try for 1-3 months with strictn diet and glipizide since she recognized that she has not been followed a diabetic diet. 5. Back pain: I showed pt the results of test (UDS) and she did not complaint or argue the results. She also did not ask for pain management and did not want to use other resources offered.  Review of Systems  per HPI     Objective:   Physical Exam  Constitutional: She is oriented to person, place, and time. No distress.  HENT:  Head: Normocephalic.  Right Ear: External ear normal.  Left Ear: External ear normal.  Mouth/Throat: Oropharynx is clear and moist. No oropharyngeal exudate.       Normal ear canal and TM with normal landmarks. No bulging.   Eyes: Conjunctivae and EOM are normal. Pupils are equal, round, and reactive to light.  Neck: Normal range of motion. Neck supple. No thyromegaly present.  Cardiovascular: Normal  rate, regular rhythm and normal heart sounds.   No murmur heard. Pulmonary/Chest: Effort normal and breath sounds normal.  Abdominal: Soft. Bowel sounds are normal.  Musculoskeletal: She exhibits no edema.  Lymphadenopathy:    She has no cervical adenopathy.  Neurological: She is alert and oriented to person, place, and time. She has normal strength and normal reflexes. No cranial nerve deficit or sensory deficit.  normal gait. Normal coordination. Weber lateralized to the left side ("good") side. Rinne was normal bilaterally (air>bone conduction)      Assessment & Plan:

## 2012-05-07 DIAGNOSIS — H938X1 Other specified disorders of right ear: Secondary | ICD-10-CM | POA: Insufficient documentation

## 2012-05-07 DIAGNOSIS — G252 Other specified forms of tremor: Secondary | ICD-10-CM | POA: Insufficient documentation

## 2012-05-07 NOTE — Assessment & Plan Note (Addendum)
Pt was on opioid treatment for chronic back pain under pain-contract. UDS was found to be positive for cocaine and negative for opioids. Initially pt was upset with results. Refer to prior notes for details.Today we discussed this results and pt was cooperative and agreeable with plan. We will no longer prescribed narcotics for pain management. We offered other pain management options. Pt declined physical therapy. Plan: Since she is taking ibuprofen and this seems to help, prescription NSAIDs would be an option. Discussed with pt precautions and risks of taking this medication as well as potential side effects. Pt voiced understanding. Stop ibuprofen and start Meloxicam 7.5 mg daily.

## 2012-05-07 NOTE — Assessment & Plan Note (Signed)
On the jaw and right hand. No cogwheel rigidity, normal gait. Referred memory problems, no enough time during visit to assess MMSE. Possible Parkinson's Disease (?) Plan: [facility] for more detailed evaluation.

## 2012-05-07 NOTE — Assessment & Plan Note (Addendum)
Pt understands her A1C values and impact of uncontrolled DM  but declines change in her medication at this point. Normal Diabetic foot exam. Plan: F/u in 2 months.

## 2012-05-07 NOTE — Assessment & Plan Note (Addendum)
No discharge, normal ear canal and TM. Referred mild decreased on hearing on the right. Positive Weber with lateralization to "good ear". Possible sensorineural hearing loss. Not sure if HA described has a relationship since pt has no hx of migraine. Pt was seen for diplopia in Jan/12.  No other episodes described.  Plan: Audiometry and close  F/u. Possible head imaging vs neurology consult.

## 2012-05-07 NOTE — Assessment & Plan Note (Addendum)
Controlled on enalapril 10 mg HCTZ 25 mg and amlodipine 5 mg. Plan: Continue tx. F/u in 2 months.

## 2012-05-15 ENCOUNTER — Ambulatory Visit (INDEPENDENT_AMBULATORY_CARE_PROVIDER_SITE_OTHER): Payer: Medicare Other | Admitting: Family Medicine

## 2012-05-15 ENCOUNTER — Encounter: Payer: Self-pay | Admitting: Family Medicine

## 2012-05-15 VITALS — BP 128/70 | HR 83 | Ht 63.0 in | Wt 195.0 lb

## 2012-05-15 DIAGNOSIS — I10 Essential (primary) hypertension: Secondary | ICD-10-CM | POA: Diagnosis not present

## 2012-05-15 DIAGNOSIS — R259 Unspecified abnormal involuntary movements: Secondary | ICD-10-CM | POA: Diagnosis not present

## 2012-05-15 DIAGNOSIS — H938X1 Other specified disorders of right ear: Secondary | ICD-10-CM

## 2012-05-15 DIAGNOSIS — H938X9 Other specified disorders of ear, unspecified ear: Secondary | ICD-10-CM

## 2012-05-15 DIAGNOSIS — G252 Other specified forms of tremor: Secondary | ICD-10-CM

## 2012-05-15 NOTE — Assessment & Plan Note (Signed)
Patient is at goal today and would not make any changes.  Discuss though if tremor continues would consider changing to propanolol to help.

## 2012-05-15 NOTE — Assessment & Plan Note (Signed)
Patietn does have tremor in her jaw but appears to be more related with stress.  No other physical findings that would make me concern for a underlying neurolgical condition.  Told patient to keep journal and see if we can find a trigger Also will have patient check CBG and see if hypo or hyperglycemia is possible.  Patient was accompanied with daughter and did not want to discuss the possibility of + UDS of concaine being a potential cause but jaw movements is very common.  If patient comes back with same concern would consider changing amlodipine to propanolol to help.

## 2012-05-15 NOTE — Assessment & Plan Note (Signed)
Patient hearing is normal (tested today)  and seems to be doing well Discuss potential side effect of ASA, but benefits out weight risk of medicine at this time.

## 2012-05-15 NOTE — Patient Instructions (Signed)
Very nice to meet you.  I think your tremor is not concerning or dangerous.  We discussed today about keeping a journal and see if we can find any trigger. I also want you to check your sugar when it seems to get worse to see if this is the cause.  At this time we will not make any changes, if it seems to worsen or you get fed up with it please come back and Dr. Thomes Dinning will consider changing your amlodipine to propanolol to help with the tremor, your blood pressure and potentially your headaches.  Have a good weekend.

## 2012-05-15 NOTE — Progress Notes (Signed)
  Subjective:    Patient ID: Kimberly Carlson, female    DOB: [birth year], 66 y.o.   MRN: [id number]  HPI  Patient was sent here for evaluation for possible tremor associated with unknown underlying condition by PCP.  Patient sates from time to time her jaw seems to start to quiver, does not know triggers, can happen anytime of the day, does not associate with food, with medications, or any normal event during the day.  Patient states it can occur when she is alone or with a group.  Only lasts seconds, never longer than 1 minute. Patient denies any pain but does have a left sided headache at baseline.  Patient also has some ringing or roaring in right ear.  Patient has seen Dr. Thomes Dinning recently and had CMET that showed normal calcium.   1. Hypertension Blood pressure at home:not checking Blood pressure today: 128/70 Taking Meds:yes and continuing her amlodipine which she was placed on most recently.  Side effects:no ROS: Denies headache visual changes nausea, vomiting, chest pain or abdominal pain or shortness of breath.  Review of Systems Denies fever, chills, nausea vomiting abdominal pain, dysuria, chest pain, shortness of breath dyspnea on exertion or numbness in extremities     Objective:   Physical Exam  Constitutional: She is oriented to person, place, and time.  HENT:  Head: Normocephalic.  Right Ear: External ear normal.  Left Ear: External ear normal.  Mouth/Throat: No oropharyngeal exudate.       Poor detition  Eyes: EOM are normal. Pupils are equal, round, and reactive to light. No scleral icterus.  Neck: Normal range of motion. Neck supple. No thyromegaly present.  Pulmonary/Chest: Effort normal and breath sounds normal.  Abdominal: Soft.  Musculoskeletal: Normal range of motion.  Neurological: She is alert and oriented to person, place, and time. She has normal reflexes. She displays normal reflexes. No cranial nerve deficit. She exhibits normal muscle tone. She  displays a negative Romberg sign. Coordination and gait normal.  Reflex Scores:      Tricep reflexes are 2+ on the right side and 2+ on the left side.      Bicep reflexes are 2+ on the right side and 2+ on the left side.      Brachioradialis reflexes are 2+ on the right side and 2+ on the left side.      Patellar reflexes are 2+ on the right side and 2+ on the left side.      Achilles reflexes are 2+ on the right side and 2+ on the left side.      Patient gait is normal No tremor in extremities and rest or with intention.    Skin: Skin is warm and dry.  Psychiatric: She has a normal mood and affect. Her behavior is normal.   Gen: NAD, patient from time to time has her jaw shiver anterior and posterior for 3-4 beats but goes away when she speaks or with time.         Assessment & Plan:

## 2012-06-30 ENCOUNTER — Other Ambulatory Visit: Payer: Self-pay | Admitting: Family Medicine

## 2012-06-30 DIAGNOSIS — E785 Hyperlipidemia, unspecified: Secondary | ICD-10-CM

## 2012-06-30 MED ORDER — PRAVASTATIN SODIUM 10 MG PO TABS: 10.0000 mg | ORAL_TABLET | Freq: Every day | ORAL | Status: DC

## 2012-06-30 NOTE — Progress Notes (Signed)
Received from pharmacy alert of drug interaction. Pt originally on Simvastatin for Hypercholesterolemia goal under 100 for CHD Risk Equivalent (DM).  HTN was not controlled and was needed the addition of Ca channel blocker (Amlodipine). For this reason and last LDL on 108  simvastatin was reduced in half.  Pt with no side effects reported but appreciate pharmacy alert and will change Simvastatin to Pravastatin 10 mg QHS. Since her LDL is less than 110 and goal is less than 100. Pt was contacted about new change on meds. Prescription of Pravachol has been sent to pharmacy.

## 2012-07-02 ENCOUNTER — Other Ambulatory Visit: Payer: Self-pay | Admitting: Family Medicine

## 2012-07-02 DIAGNOSIS — E785 Hyperlipidemia, unspecified: Secondary | ICD-10-CM

## 2012-07-02 MED ORDER — PRAVASTATIN SODIUM 10 MG PO TABS: 10.0000 mg | ORAL_TABLET | Freq: Every day | ORAL | Status: DC

## 2012-09-27 ENCOUNTER — Encounter (HOSPITAL_COMMUNITY): Payer: Self-pay | Admitting: Emergency Medicine

## 2012-09-27 ENCOUNTER — Other Ambulatory Visit: Payer: Self-pay

## 2012-09-27 ENCOUNTER — Emergency Department (HOSPITAL_COMMUNITY)
Admission: EM | Admit: 2012-09-27 | Discharge: 2012-09-27 | Disposition: A | Discharge status: Home / Self Care | Payer: Medicare Other | Attending: Emergency Medicine | Admitting: Emergency Medicine

## 2012-09-27 ENCOUNTER — Emergency Department (HOSPITAL_COMMUNITY): Payer: Medicare Other

## 2012-09-27 ENCOUNTER — Emergency Department (INDEPENDENT_AMBULATORY_CARE_PROVIDER_SITE_OTHER)
Admission: EM | Admit: 2012-09-27 | Discharge: 2012-09-27 | Disposition: A | Discharge status: Nursing Home (Includes ICF) | Payer: Medicare Other | Source: Home / Self Care | Attending: Emergency Medicine | Admitting: Emergency Medicine

## 2012-09-27 DIAGNOSIS — R079 Chest pain, unspecified: Secondary | ICD-10-CM

## 2012-09-27 DIAGNOSIS — Z7982 Long term (current) use of aspirin: Secondary | ICD-10-CM | POA: Insufficient documentation

## 2012-09-27 DIAGNOSIS — E119 Type 2 diabetes mellitus without complications: Secondary | ICD-10-CM | POA: Insufficient documentation

## 2012-09-27 DIAGNOSIS — N390 Urinary tract infection, site not specified: Secondary | ICD-10-CM | POA: Diagnosis not present

## 2012-09-27 DIAGNOSIS — R0789 Other chest pain: Secondary | ICD-10-CM | POA: Insufficient documentation

## 2012-09-27 DIAGNOSIS — I10 Essential (primary) hypertension: Secondary | ICD-10-CM | POA: Diagnosis not present

## 2012-09-27 DIAGNOSIS — R1013 Epigastric pain: Secondary | ICD-10-CM | POA: Insufficient documentation

## 2012-09-27 DIAGNOSIS — R109 Unspecified abdominal pain: Secondary | ICD-10-CM

## 2012-09-27 DIAGNOSIS — Z87891 Personal history of nicotine dependence: Secondary | ICD-10-CM | POA: Insufficient documentation

## 2012-09-27 DIAGNOSIS — Z79899 Other long term (current) drug therapy: Secondary | ICD-10-CM | POA: Diagnosis not present

## 2012-09-27 DIAGNOSIS — J984 Other disorders of lung: Secondary | ICD-10-CM | POA: Diagnosis not present

## 2012-09-27 HISTORY — DX: Type 2 diabetes mellitus without complications: E11.9

## 2012-09-27 HISTORY — DX: Essential (primary) hypertension: I10

## 2012-09-27 LAB — POCT I-STAT 3, VENOUS BLOOD GAS (G3P V)
Acid-Base Excess: 2 mmol/L (ref 0.0–2.0)
Bicarbonate: 28.8 mEq/L — ABNORMAL HIGH (ref 20.0–24.0)
O2 Saturation: 83 %
TCO2: 30 mmol/L (ref 0–100)
pCO2, Ven: 54.8 mmHg — ABNORMAL HIGH (ref 45.0–50.0)
pH, Ven: 7.329 — ABNORMAL HIGH (ref 7.250–7.300)
pO2, Ven: 51 mmHg — ABNORMAL HIGH (ref 30.0–45.0)

## 2012-09-27 LAB — URINE MICROSCOPIC-ADD ON

## 2012-09-27 LAB — HEPATIC FUNCTION PANEL
ALT: 22 U/L (ref 0–35)
AST: 24 U/L (ref 0–37)
Albumin: 3.5 g/dL (ref 3.5–5.2)
Alkaline Phosphatase: 104 U/L (ref 39–117)
Bilirubin, Direct: 0.1 mg/dL (ref 0.0–0.3)
Indirect Bilirubin: 0.4 mg/dL (ref 0.3–0.9)
Total Bilirubin: 0.5 mg/dL (ref 0.3–1.2)
Total Protein: 7.4 g/dL (ref 6.0–8.3)

## 2012-09-27 LAB — BASIC METABOLIC PANEL
BUN: 22 mg/dL (ref 6–23)
CO2: 27 mEq/L (ref 19–32)
Calcium: 9.6 mg/dL (ref 8.4–10.5)
Chloride: 94 mEq/L — ABNORMAL LOW (ref 96–112)
Creatinine, Ser: 0.92 mg/dL (ref 0.50–1.10)
GFR calc Af Amer: 72 mL/min — ABNORMAL LOW (ref >90)
GFR calc non Af Amer: 63 mL/min — ABNORMAL LOW (ref >90)
Glucose, Bld: 410 mg/dL — ABNORMAL HIGH (ref 70–99)
Potassium: 3.7 mEq/L (ref 3.5–5.1)
Sodium: 134 mEq/L — ABNORMAL LOW (ref 135–145)

## 2012-09-27 LAB — CBC
HCT: 34 % — ABNORMAL LOW (ref 36.0–46.0)
Hemoglobin: 11.5 g/dL — ABNORMAL LOW (ref 12.0–15.0)
MCH: 29.9 pg (ref 26.0–34.0)
MCHC: 33.8 g/dL (ref 30.0–36.0)
MCV: 88.5 fL (ref 78.0–100.0)
Platelets: 196 10*3/uL (ref 150–400)
RBC: 3.84 MIL/uL — ABNORMAL LOW (ref 3.87–5.11)
RDW: 12.2 % (ref 11.5–15.5)
WBC: 13.3 10*3/uL — ABNORMAL HIGH (ref 4.0–10.5)

## 2012-09-27 LAB — GLUCOSE, CAPILLARY: Glucose-Capillary: 129 mg/dL — ABNORMAL HIGH (ref 70–99)

## 2012-09-27 LAB — URINALYSIS, ROUTINE W REFLEX MICROSCOPIC
Bilirubin Urine: NEGATIVE
Glucose, UA: >1000 mg/dL — AB
Ketones, ur: NEGATIVE mg/dL
Nitrite: NEGATIVE
Protein, ur: 30 mg/dL — AB
Specific Gravity, Urine: 1.021 (ref 1.005–1.030)
Urobilinogen, UA: 1 mg/dL (ref 0.0–1.0)
pH: 5.5 (ref 5.0–8.0)

## 2012-09-27 LAB — POCT I-STAT TROPONIN I: Troponin i, poc: 0.01 ng/mL (ref 0.00–0.08)

## 2012-09-27 LAB — D-DIMER, QUANTITATIVE: D-Dimer, Quant: 1 ug/mL-FEU — ABNORMAL HIGH (ref 0.00–0.48)

## 2012-09-27 LAB — LIPASE, BLOOD: Lipase: 39 U/L (ref 11–59)

## 2012-09-27 LAB — KETONES, QUALITATIVE: Acetone, Bld: NEGATIVE

## 2012-09-27 LAB — TROPONIN I: Troponin I: <0.3 ng/mL (ref <0.30)

## 2012-09-27 MED ORDER — SODIUM CHLORIDE 0.9 % IV BOLUS (SEPSIS)
1000.0000 mL | Freq: Once | INTRAVENOUS | Status: AC
  Administered 2012-09-27: 1000 mL via INTRAVENOUS

## 2012-09-27 MED ORDER — CEPHALEXIN 500 MG PO CAPS: 500.0000 mg | ORAL_CAPSULE | Freq: Two times a day (BID) | ORAL | Status: DC

## 2012-09-27 MED ORDER — INSULIN ASPART 100 UNIT/ML ~~LOC~~ SOLN
10.0000 [IU] | Freq: Once | SUBCUTANEOUS | Status: AC
  Administered 2012-09-27: 10 [IU] via SUBCUTANEOUS
  Filled 2012-09-27: qty 1

## 2012-09-27 MED ORDER — PANTOPRAZOLE SODIUM 40 MG IV SOLR
40.0000 mg | Freq: Once | INTRAVENOUS | Status: AC
  Administered 2012-09-27: 40 mg via INTRAVENOUS
  Filled 2012-09-27: qty 40

## 2012-09-27 MED ORDER — IOHEXOL 350 MG/ML SOLN
100.0000 mL | Freq: Once | INTRAVENOUS | Status: AC | PRN
  Administered 2012-09-27: 100 mL via INTRAVENOUS

## 2012-09-27 MED ORDER — GI COCKTAIL ~~LOC~~
30.0000 mL | Freq: Once | ORAL | Status: AC
  Administered 2012-09-27: 30 mL via ORAL
  Filled 2012-09-27: qty 30

## 2012-09-27 MED ORDER — ONDANSETRON HCL 4 MG/2ML IJ SOLN
4.0000 mg | Freq: Once | INTRAMUSCULAR | Status: AC
  Administered 2012-09-27: 4 mg via INTRAVENOUS
  Filled 2012-09-27: qty 2

## 2012-09-27 MED ORDER — DEXTROSE 5 % IV SOLN
1.0000 g | Freq: Once | INTRAVENOUS | Status: AC
  Administered 2012-09-27: 1 g via INTRAVENOUS
  Filled 2012-09-27: qty 10

## 2012-09-27 MED ORDER — ACETAMINOPHEN 325 MG PO TABS
650.0000 mg | ORAL_TABLET | Freq: Once | ORAL | Status: AC
  Administered 2012-09-27: 650 mg via ORAL
  Filled 2012-09-27: qty 2

## 2012-09-27 NOTE — ED Provider Notes (Signed)
History     CSN: [id number]  Arrival date & time 09/27/12  1329   First MD Initiated Contact with Patient 09/27/12 1359      Chief Complaint  Patient presents with  . Chest Pain    (Consider location/radiation/quality/duration/timing/severity/associated sxs/prior treatment) HPI Comments: Patient presents to urgent care this afternoon complaining of substernal and epigastric chest pain that started yesterday. Describes the pain as dull in character movement and activity makes her pain worse she does describe having her visual little alert. No fevers nausea vomiting shortness of breath. She describes it patient started suddenly yesterday she cannot recall having had anything to eat that could have induced his pain. The pain does vary in intensity since yesterday goes from 2 to a 7 . Patient denies any respiratory symptoms such as cough or shortness of breath congestion or fevers.  Patient describes that her pain feels like goes through her chest all way towards her back.  Patient is a 66 y.o. female presenting with chest pain. The history is provided by the patient.  Chest Pain Chest pain occurs constantly. The chest pain is unchanged. At its most intense, the pain is at 8/10. The quality of the pain is described as dull and aching. The pain radiates to the epigastrium and upper back. Chest pain is worsened by deep breathing and certain positions. Pertinent negatives for primary symptoms include no fever, no shortness of breath, no cough, no nausea, no vomiting and no dizziness.  Pertinent negatives for associated symptoms include no diaphoresis and no numbness. She tried nothing for the symptoms.     Past Medical History  Diagnosis Date  . Hypertension   . Diabetes mellitus without complication     Past Surgical History  Procedure Date  . Abdominal hysterectomy     No family history on file.  History  Substance Use Topics  . Smoking status: Former Smoker    Types: Cigarettes      Quit date: 03/05/2011  . Smokeless tobacco: Not on file  . Alcohol Use: No    OB History    Grav Para Term Preterm Abortions TAB SAB Ect Mult Living                  Review of Systems  Constitutional: Negative for fever, diaphoresis and activity change.  Respiratory: Negative for cough and shortness of breath.   Cardiovascular: Positive for chest pain.  Gastrointestinal: Negative for nausea and vomiting.  Neurological: Negative for dizziness and numbness.    Allergies  Metformin  Home Medications   Current Outpatient Rx  Name Route Sig Dispense Refill  . ASPIRIN 81 MG PO CHEW Oral Chew 81 mg by mouth daily.      . ENALAPRIL-HYDROCHLOROTHIAZIDE 10-25 MG PO TABS Oral Take 1 tablet by mouth daily. For blood pressure 30 tablet 6  . GLIPIZIDE ER 10 MG PO TB24 Oral Take 1 tablet (10 mg total) by mouth daily. 30 tablet 6  . PRAVASTATIN SODIUM 10 MG PO TABS Oral Take 1 tablet (10 mg total) by mouth daily. 30 tablet 6  . AMLODIPINE BESYLATE 5 MG PO TABS Oral Take 1 tablet (5 mg total) by mouth daily. 30 tablet 6  . GABAPENTIN 600 MG PO TABS Oral Take 600 mg by mouth 3 (three) times daily.      . MELOXICAM 7.5 MG PO TABS Oral Take 1 tablet (7.5 mg total) by mouth daily. 30 tablet 0    BP 165/65  Pulse 110  Temp  99.8 F (37.7 C) (Oral)  Resp 20  SpO2 96%  Physical Exam  Nursing note and vitals reviewed. Constitutional: Vital signs are normal. She appears well-developed and well-nourished.  Non-toxic appearance. She does not have a sickly appearance. She does not appear ill. No distress.  Eyes: Pupils are equal, round, and reactive to light.  Neck: Neck supple. No JVD present.  Cardiovascular: Normal rate, regular rhythm and normal pulses.  PMI is not displaced.  Exam reveals no friction rub.   No murmur heard. Pulmonary/Chest: Effort normal and breath sounds normal.  Abdominal: She exhibits no mass. There is tenderness in the epigastric area. There is no rigidity, no  rebound and no guarding.    Neurological: She is alert.  Skin: No rash noted. No erythema.    ED Course  Procedures (including critical care time)  Labs Reviewed - No data to display No results found.   1. Chest pain at rest     EKG at urgent care at 14 hours. Revealed sinus tachycardia at 12/27/2006. PR QRS and QT intervals within normal. No obvious ST or T. elevation or depressions were seen on any leads.  MDM  66 year old female was factors include hyperlipidemia, diabetes, presents with atypical substernal and epigastric tenderness.    Rosana Hoes, MD 09/27/12 [phone]

## 2012-09-27 NOTE — ED Provider Notes (Signed)
History     CSN: [id number]  Arrival date & time 09/27/12  1457   First MD Initiated Contact with Patient 09/27/12 1626      Chief Complaint  Patient presents with  . Abdominal Pain    (Consider location/radiation/quality/duration/timing/severity/associated sxs/prior treatment) HPI Comments: Patient presents with epigastric pain it started yesterday morning it has been constant for the past 36 hours. It is dull and worse with movement worse with activity. She denies any chest pain, shortness of breath, nausea or vomiting. Nothing makes it better or worse. It radiates to her back into her sides. She denies similar pain in the past. No history of GI problems. No history of heart problems. No NSAID or alcohol abuse.  Patient is a 66 y.o. female presenting with abdominal pain. The history is provided by the patient.  Abdominal Pain The primary symptoms of the illness include abdominal pain. The primary symptoms of the illness do not include fever, shortness of breath, nausea, vomiting, diarrhea, dysuria, vaginal discharge or vaginal bleeding.  Additional symptoms associated with the illness include back pain. Symptoms associated with the illness do not include hematuria.    Past Medical History  Diagnosis Date  . Hypertension   . Diabetes mellitus without complication     Past Surgical History  Procedure Date  . Abdominal hysterectomy     No family history on file.  History  Substance Use Topics  . Smoking status: Former Smoker    Types: Cigarettes    Quit date: 03/05/2011  . Smokeless tobacco: Not on file  . Alcohol Use: No    OB History    Grav Para Term Preterm Abortions TAB SAB Ect Mult Living                  Review of Systems  Constitutional: Negative for fever and activity change.  HENT: Negative for congestion and rhinorrhea.   Respiratory: Positive for chest tightness. Negative for cough and shortness of breath.   Cardiovascular: Negative for chest pain.    Gastrointestinal: Positive for abdominal pain. Negative for nausea, vomiting and diarrhea.  Genitourinary: Negative for dysuria, hematuria, vaginal bleeding and vaginal discharge.  Musculoskeletal: Positive for back pain.  Skin: Negative for rash.  Neurological: Negative for dizziness, weakness and headaches.    Allergies  Metformin  Home Medications   Current Outpatient Rx  Name Route Sig Dispense Refill  . ASPIRIN 81 MG PO CHEW Oral Chew 81 mg by mouth daily.      . ENALAPRIL-HYDROCHLOROTHIAZIDE 10-25 MG PO TABS Oral Take 1 tablet by mouth daily. For blood pressure 30 tablet 6  . GLIPIZIDE ER 10 MG PO TB24 Oral Take 1 tablet (10 mg total) by mouth daily. 30 tablet 6  . IBUPROFEN 200 MG PO TABS Oral Take 200 mg by mouth every 6 (six) hours as needed. For headache    . SIMVASTATIN 80 MG PO TABS Oral Take 80 mg by mouth at bedtime.    . CEPHALEXIN 500 MG PO CAPS Oral Take 1 capsule (500 mg total) by mouth 2 (two) times daily. 14 capsule 0    BP 129/75  Pulse 91  Temp 100.3 F (37.9 C) (Oral)  Resp 18  SpO2 95%  Physical Exam  Constitutional: She is oriented to person, place, and time. She appears well-developed and well-nourished. No distress.  HENT:  Head: Normocephalic and atraumatic.  Mouth/Throat: Oropharynx is clear and moist. No oropharyngeal exudate.  Eyes: Conjunctivae normal and EOM are normal. Pupils are  equal, round, and reactive to light.  Neck: Normal range of motion. Neck supple.  Cardiovascular: Normal rate, regular rhythm and normal heart sounds.   No murmur heard. Pulmonary/Chest: Effort normal and breath sounds normal. No respiratory distress.  Abdominal: Soft. There is tenderness. There is no rebound and no guarding.       Mild to moderate epigastric tenderness, no guarding or rebound. No right upper quadrant tenderness. Lower abdomen soft and nontender.  Musculoskeletal: Normal range of motion. She exhibits no edema and no tenderness.  Neurological: She  is alert and oriented to person, place, and time. No cranial nerve deficit. She exhibits normal muscle tone. Coordination normal.  Skin: Skin is warm.    ED Course  Procedures (including critical care time)  Labs Reviewed  CBC - Abnormal; Notable for the following:    WBC 13.3 (*)     RBC 3.84 (*)     Hemoglobin 11.5 (*)     HCT 34.0 (*)     All other components within normal limits  BASIC METABOLIC PANEL - Abnormal; Notable for the following:    Sodium 134 (*)     Chloride 94 (*)     Glucose, Bld 410 (*)     GFR calc non Af Amer 63 (*)     GFR calc Af Amer 72 (*)     All other components within normal limits  D-DIMER, QUANTITATIVE - Abnormal; Notable for the following:    D-Dimer, Quant 1.00 (*)     All other components within normal limits  URINALYSIS, ROUTINE W REFLEX MICROSCOPIC - Abnormal; Notable for the following:    APPearance CLOUDY (*)     Glucose, UA >1000 (*)     Hgb urine dipstick TRACE (*)     Protein, ur 30 (*)     Leukocytes, UA SMALL (*)     All other components within normal limits  POCT I-STAT 3, BLOOD GAS (G3P V) - Abnormal; Notable for the following:    pH, Ven 7.329 (*)     pCO2, Ven 54.8 (*)     pO2, Ven 51.0 (*)     Bicarbonate 28.8 (*)     All other components within normal limits  URINE MICROSCOPIC-ADD ON - Abnormal; Notable for the following:    Squamous Epithelial / LPF MANY (*)     Bacteria, UA MANY (*)     All other components within normal limits  GLUCOSE, CAPILLARY - Abnormal; Notable for the following:    Glucose-Capillary 129 (*)     All other components within normal limits  POCT I-STAT TROPONIN I  LIPASE, BLOOD  TROPONIN I  HEPATIC FUNCTION PANEL  KETONES, QUALITATIVE  URINE CULTURE   Ct Angio Chest W/cm &/or Wo Cm  09/27/2012  *RADIOLOGY REPORT*  Clinical Data:  Abdominal pain.  CT ANGIOGRAPHY CHEST CT ABDOMEN AND PELVIS WITH CONTRAST  Technique:  Multidetector CT imaging of the chest was performed using the standard protocol  during bolus administration of intravenous contrast.  Multiplanar CT image reconstructions including MIPs were obtained to evaluate the vascular anatomy. Multidetector CT imaging of the abdomen and pelvis was performed using the standard protocol during bolus administration of intravenous contrast.  Contrast: 169mL OMNIPAQUE IOHEXOL 350 MG/ML SOLN  Comparison:  None  CTA CHEST  Findings:  No filling defects in the pulmonary arteries to suggest pulmonary emboli. Heart is normal size. Aorta is normal caliber. Minimal scattered coronary artery calcifications. No mediastinal, hilar, or axillary adenopathy.  Minimal  linear densities in the lung bases, scarring or atelectasis.  Lungs otherwise clear.  No effusions.  Review of the MIP images confirms the above findings.  IMPRESSION: No acute findings in the chest.  CT ABDOMEN AND PELVIS  Findings: Liver, spleen, pancreas, adrenals, gallbladder, kidneys unremarkable.  Aorta is normal caliber.  No aneurysm or dissection. Prior hysterectomy.  No adnexal masses.  Urinary bladder grossly unremarkable.  Bowel grossly unremarkable.  No free fluid, free air, or adenopathy.  Review of the MIP images confirms the above findings.  IMPRESSION: No acute findings in the abdomen or pelvis.   Original Report Authenticated By: Rolm Baptise, M.D.    Ct Abdomen Pelvis W Contrast  09/27/2012  *RADIOLOGY REPORT*  Clinical Data:  Abdominal pain.  CT ANGIOGRAPHY CHEST CT ABDOMEN AND PELVIS WITH CONTRAST  Technique:  Multidetector CT imaging of the chest was performed using the standard protocol during bolus administration of intravenous contrast.  Multiplanar CT image reconstructions including MIPs were obtained to evaluate the vascular anatomy. Multidetector CT imaging of the abdomen and pelvis was performed using the standard protocol during bolus administration of intravenous contrast.  Contrast: 11mL OMNIPAQUE IOHEXOL 350 MG/ML SOLN  Comparison:  None  CTA CHEST  Findings:  No filling  defects in the pulmonary arteries to suggest pulmonary emboli. Heart is normal size. Aorta is normal caliber. Minimal scattered coronary artery calcifications. No mediastinal, hilar, or axillary adenopathy.  Minimal linear densities in the lung bases, scarring or atelectasis.  Lungs otherwise clear.  No effusions.  Review of the MIP images confirms the above findings.  IMPRESSION: No acute findings in the chest.  CT ABDOMEN AND PELVIS  Findings: Liver, spleen, pancreas, adrenals, gallbladder, kidneys unremarkable.  Aorta is normal caliber.  No aneurysm or dissection. Prior hysterectomy.  No adnexal masses.  Urinary bladder grossly unremarkable.  Bowel grossly unremarkable.  No free fluid, free air, or adenopathy.  Review of the MIP images confirms the above findings.  IMPRESSION: No acute findings in the abdomen or pelvis.   Original Report Authenticated By: Rolm Baptise, M.D.      1. Abdominal pain   2. Urinary tract infection       MDM  2 days of epigastric pain it is worse with palpation worse with movement. No nausea no vomiting no fever. No history of gastritis or ulcer. No NSAID abuse or alcohol abuse. No chest pain or shortness of breath  Mild leukocytosis and lab studies. Hyperglycemia without DKA. Patient given IV fluids, antiemetics., PPI and GI cocktail. She had improvement in her symptoms. CT pending at time of transfer to CDU. Possible UTI with contaminated sample. D/w NP Tamala Julian.    Date: 09/27/2012  Rate: 106  Rhythm: sinus tachycardia  QRS Axis: normal  Intervals: normal  ST/T Wave abnormalities: normal  Conduction Disutrbances:none  Narrative Interpretation: Rate faster  Old EKG Reviewed: changes noted    Ezequiel Essex, MD 09/27/12 2338

## 2012-09-27 NOTE — ED Notes (Signed)
PT GIVEN MEAL TRAY WITH A SPRITE ZERO

## 2012-09-27 NOTE — ED Notes (Signed)
Pt reports having epigastric pain since yesterday, denies n/v; does report headache; movement makes pain worse; denies sob

## 2012-09-27 NOTE — ED Notes (Signed)
Pt c/o chest pain x1 day... Sx: headaches, eye pain, chest hurts when she moves, blurry vision .... Denies: fever, nauseas, vomiting, SOB, edema, numbness... Hx of HTN... Pt is alert and responsive w/no signs of distress.

## 2012-09-27 NOTE — ED Provider Notes (Signed)
Lab and radiology results reviewed, discussed with Dr. Wyvonnia Dusky and shared with patient. Patient continues to report epigastric discomfort.  Temp 99.9.  U/A suspicious for infection, but sample with numerous epithelial cells--culture pending.  Will treat with keflex.  Patient to follow-up with her PCP.  Norman Herrlich, NP 09/28/12 [phone]

## 2012-09-28 NOTE — ED Provider Notes (Signed)
Medical screening examination/treatment/procedure(s) were conducted as a shared visit with non-physician practitioner(s) and myself.  I personally evaluated the patient during the encounter    Ezequiel Essex, MD 09/28/12 1846

## 2012-09-29 LAB — URINE CULTURE: Colony Count: 45000

## 2012-10-06 ENCOUNTER — Encounter: Payer: Self-pay | Admitting: Home Health Services

## 2012-10-07 ENCOUNTER — Encounter: Payer: Self-pay | Admitting: Home Health Services

## 2012-12-01 ENCOUNTER — Other Ambulatory Visit: Payer: Self-pay | Admitting: *Deleted

## 2012-12-01 DIAGNOSIS — I10 Essential (primary) hypertension: Secondary | ICD-10-CM

## 2012-12-02 NOTE — Telephone Encounter (Signed)
Patient is calling to check the status of her refill request.  She started this a week ago, by calling her pharmacy.  She needs Glipizide and Enalapril Hyrdrochlorothiazide.  She is completely out of these meds.

## 2012-12-03 ENCOUNTER — Other Ambulatory Visit: Payer: Self-pay | Admitting: Family Medicine

## 2012-12-03 DIAGNOSIS — I10 Essential (primary) hypertension: Secondary | ICD-10-CM

## 2012-12-03 MED ORDER — GLIPIZIDE ER 10 MG PO TB24: 10.0000 mg | ORAL_TABLET | Freq: Every day | ORAL | Status: DC

## 2012-12-03 MED ORDER — ENALAPRIL-HYDROCHLOROTHIAZIDE 10-25 MG PO TABS: 1.0000 | ORAL_TABLET | Freq: Every day | ORAL | Status: DC

## 2012-12-03 NOTE — Telephone Encounter (Signed)
Refill sent to pharmacy.   

## 2012-12-03 NOTE — Telephone Encounter (Signed)
Pt is calling again needs this today

## 2012-12-15 ENCOUNTER — Ambulatory Visit (INDEPENDENT_AMBULATORY_CARE_PROVIDER_SITE_OTHER): Payer: Medicare Other | Admitting: Family Medicine

## 2012-12-15 ENCOUNTER — Encounter: Payer: Self-pay | Admitting: Family Medicine

## 2012-12-15 VITALS — BP 156/65 | HR 91 | Temp 98.8°F | Ht 63.0 in | Wt 193.2 lb

## 2012-12-15 DIAGNOSIS — IMO0001 Reserved for inherently not codable concepts without codable children: Secondary | ICD-10-CM | POA: Diagnosis not present

## 2012-12-15 DIAGNOSIS — E1165 Type 2 diabetes mellitus with hyperglycemia: Secondary | ICD-10-CM

## 2012-12-15 LAB — POCT GLYCOSYLATED HEMOGLOBIN (HGB A1C): Hemoglobin A1C: 10.5

## 2012-12-15 NOTE — Patient Instructions (Addendum)

## 2012-12-16 ENCOUNTER — Telehealth: Payer: Self-pay | Admitting: *Deleted

## 2012-12-16 NOTE — Telephone Encounter (Signed)
appt made for 12/25/12 @ 1000 am with Dr. Valentina Lucks for DM management.Kimberly Carlson

## 2012-12-16 NOTE — Progress Notes (Signed)
Family Medicine Office Visit Note   Subjective:   Patient ID: Kimberly Carlson, female  DOB: 05-04-1946, 67 y.o.. MRN: [id number]   Patient comes today to follow up her diabetes. She denies current complains; her last A1 C was done in May last year and I was 9.8. Patient reports not be eating properly and called the type of food as  "junk". She denies symptoms of hypoglycemia or polyuria/ polydipsia.  She controls her glucose with glipizide XL 10 mg once a day. Pt refuses to take Metformin do to reported side effects of diarrhea. She also have declines insulin treatment.  Review of Systems:  Pt denies SOB, chest pain, palpitations, headaches, dizziness, numbness or weakness. No changes on BM habits. No unintentional weigh loss/gain.  Objective:   Physical Exam: Gen:  NAD HEENT: Moist mucous membranes  CV: Regular rate and rhythm, no murmurs rubs or gallops PULM: Clear to auscultation bilaterally. No wheezes/rales/rhonchi ABD: Soft, non tender, non distended, normal bowel sounds EXT: No edema Neuro: Alert and oriented x3. No focalization  Assessment & Plan:

## 2012-12-16 NOTE — Assessment & Plan Note (Addendum)
Pt declines use of metformin, or insulin. A1C on 10.4 today (worsen since 7/13)  Plan: Discussed in depth with pt about risks of uncontrol DM. Patient with very poor diabetes control and intolerant of metformin due to G.I. side effects.  She is resistant to the idea of beginning insulin treatment and would like to consider other oral therapies. I am referring her to the pharmacy clinic to discuss other alternative options for managing her diabetes that does not include injectables.

## 2012-12-16 NOTE — Telephone Encounter (Signed)
Message copied by Teddy Spike on Tue Dec 16, 2012  1:54 PM ------      Message from: Marcie Mowers, [place]      Created: Tue Dec 16, 2012 12:47 PM       Laureen Ochs, can you help to schedule an appointment with Dr. Valentina Lucks( [facility] DM management) and let pt know.       Thanks.

## 2012-12-25 ENCOUNTER — Ambulatory Visit (INDEPENDENT_AMBULATORY_CARE_PROVIDER_SITE_OTHER): Payer: Medicare Other | Admitting: Pharmacist

## 2012-12-25 ENCOUNTER — Encounter: Payer: Self-pay | Admitting: Pharmacist

## 2012-12-25 VITALS — BP 154/58 | HR 92 | Ht 63.0 in | Wt 194.0 lb

## 2012-12-25 DIAGNOSIS — E1165 Type 2 diabetes mellitus with hyperglycemia: Secondary | ICD-10-CM

## 2012-12-25 DIAGNOSIS — IMO0001 Reserved for inherently not codable concepts without codable children: Secondary | ICD-10-CM | POA: Diagnosis not present

## 2012-12-25 MED ORDER — "INSULIN SYRINGE 31G X 5/16"" 1 ML MISC": 1.0000 | Freq: Every day | Status: DC

## 2012-12-25 MED ORDER — INSULIN GLARGINE 100 UNIT/ML ~~LOC~~ SOLN: 10.0000 [IU] | Freq: Every day | SUBCUTANEOUS | Status: DC

## 2012-12-25 MED ORDER — INSULIN DETEMIR 100 UNIT/ML ~~LOC~~ SOLN
10.0000 [IU] | Freq: Every day | SUBCUTANEOUS | Status: DC
Start: 2012-12-25 — End: 2013-04-22

## 2012-12-25 NOTE — Patient Instructions (Addendum)
We are starting a long-acting insulin called Lantus today.  We started with 10 units today. Check your blood sugar every morning before you eat.  If this number is over 100, increase your Lantus by 1 unit that day.  Continue increasing every day until your blood sugar is under 100.    Keep a log of your blood sugar readings and bring that with you to your next visit.    Call us if you feel like you are getting lower than you should.    Follow up with Dr. Valentina Lucks in 3-4 weeks.

## 2012-12-25 NOTE — Addendum Note (Signed)
Addended by: Leavy Cella on: 12/25/2012 03:18 PM   Modules accepted: Orders

## 2012-12-25 NOTE — Progress Notes (Signed)
  Subjective:    Patient ID: Kimberly Carlson, female    DOB: May 05, 1946, 67 y.o.   MRN: [id number]  HPI [name] arrives to clinic in good spirits for diabetes management.  She reports having been diagnosed since 2002 and has only take oral therapies.  She tried metformin in the past but could not tolerate it.  She is currently on glipizide XL 10 mg daily.  She is hesitant to start injection therapy, but she agrees if it is the best option for her health.  She reports she has a meter and strips at home, but she currently does not check her glucose.  Reports nocturia least 1x/night, sometimes more than 2x/night.     Review of Systems     Objective:   Physical Exam        Assessment & Plan:   Diabetes of 11 yrs duration currently under poor control of blood glucose based on  . Lab Results  Component Value Date   HGBA1C 10.5 12/15/2012   . Control is suboptimal due to inadequate treatment regimen. Denies hypoglycemic events.  Able to verbalize appropriate hypoglycemia management plan. Started basal insulin Lantus (insulin glargine) at 10 units. Pt agreeable to checking fasting AM glucose daily. Patient will continue to titrate 1 unit/day if fasting CBGs > 100mg /dl until fasting CBGs reach goal or next visit.  Educated on injection technique. Patient demonstrated first insulin injection in office.  Written patient instructions provided. Provided log book to record glucose levels. Provided 1 vial Lantus sample.  Follow up in  Pharmacist Clinic Visit 3-4 weeks.   Total time in face to face counseling 30 minutes.  Patient seen with Ocie Doyne PharmD, Pharmacy Resident.

## 2012-12-25 NOTE — Assessment & Plan Note (Signed)
  Diabetes of 11 yrs duration currently under poor control of blood glucose based on  . Lab Results  Component Value Date   HGBA1C 10.5 12/15/2012   . Control is suboptimal due to inadequate treatment regimen. Denies hypoglycemic events.  Able to verbalize appropriate hypoglycemia management plan. Started basal insulin Lantus (insulin glargine) at 10 units. Pt agreeable to checking fasting AM glucose daily. Patient will continue to titrate 1 unit/day if fasting CBGs > 100mg /dl until fasting CBGs reach goal or next visit.  Educated on injection technique. Patient demonstrated first insulin injection in office.  Written patient instructions provided. Provided log book to record glucose levels. Provided 1 vial Lantus sample.  Follow up in  Pharmacist Clinic Visit 3-4 weeks.   Total time in face to face counseling 30 minutes.  Patient seen with Ocie Doyne PharmD, Pharmacy Resident.

## 2012-12-26 NOTE — Progress Notes (Signed)
Patient ID: Kimberly Carlson, female   DOB: 1946/02/01, 67 y.o.   MRN: [id number] Reviewed:  Agree with Dr Graylin Shiver documentation and management.

## 2012-12-29 ENCOUNTER — Telehealth: Payer: Self-pay | Admitting: Pharmacist

## 2012-12-29 MED ORDER — ONETOUCH LANCETS MISC: 1.0000 | Freq: Once | Status: DC

## 2012-12-29 MED ORDER — ONETOUCH ULTRA 2 W/DEVICE KIT: 1.0000 | PACK | Freq: Once | Status: DC

## 2012-12-29 MED ORDER — GLUCOSE BLOOD VI STRP: ORAL_STRIP | Status: DC

## 2012-12-29 NOTE — Telephone Encounter (Signed)
Patient called and requested New meter with strips and lancets.   She does not have a working meter.  She states she has active medicare.   She is willing to start testing 1-2 times daily and adjusting levemir.

## 2013-01-08 ENCOUNTER — Ambulatory Visit: Payer: Medicare Other | Admitting: Pharmacist

## 2013-02-11 ENCOUNTER — Encounter: Payer: Self-pay | Admitting: *Deleted

## 2013-02-20 ENCOUNTER — Other Ambulatory Visit: Payer: Self-pay | Admitting: *Deleted

## 2013-02-20 ENCOUNTER — Telehealth: Payer: Self-pay | Admitting: Family Medicine

## 2013-02-20 NOTE — Telephone Encounter (Signed)
Started to speak with patient and got disconnected

## 2013-02-20 NOTE — Telephone Encounter (Signed)
Pt needs to come to discuss this. I am not be able to return her call until April 2nd.

## 2013-02-20 NOTE — Telephone Encounter (Signed)
Patient is calling with questions about the 2 different kinds of insulin she is on.

## 2013-02-20 NOTE — Telephone Encounter (Signed)
Spoke with patient about the lantus. She is going to pick up her rx from the pharmacy

## 2013-03-16 ENCOUNTER — Encounter: Payer: Self-pay | Admitting: *Deleted

## 2013-04-22 ENCOUNTER — Encounter: Payer: Self-pay | Admitting: Family Medicine

## 2013-04-22 ENCOUNTER — Ambulatory Visit (INDEPENDENT_AMBULATORY_CARE_PROVIDER_SITE_OTHER): Payer: Medicare Other | Admitting: Family Medicine

## 2013-04-22 VITALS — BP 138/76 | HR 90 | Ht 63.0 in | Wt 190.0 lb

## 2013-04-22 DIAGNOSIS — E7800 Pure hypercholesterolemia, unspecified: Secondary | ICD-10-CM

## 2013-04-22 DIAGNOSIS — E1165 Type 2 diabetes mellitus with hyperglycemia: Secondary | ICD-10-CM

## 2013-04-22 DIAGNOSIS — I10 Essential (primary) hypertension: Secondary | ICD-10-CM

## 2013-04-22 DIAGNOSIS — IMO0001 Reserved for inherently not codable concepts without codable children: Secondary | ICD-10-CM

## 2013-04-22 LAB — POCT GLYCOSYLATED HEMOGLOBIN (HGB A1C): Hemoglobin A1C: 7.5

## 2013-04-22 MED ORDER — SIMVASTATIN 80 MG PO TABS: 80.0000 mg | ORAL_TABLET | Freq: Every day | ORAL | Status: DC

## 2013-04-22 MED ORDER — INSULIN DETEMIR 100 UNIT/ML ~~LOC~~ SOLN: 25.0000 [IU] | Freq: Every day | SUBCUTANEOUS | Status: DC

## 2013-04-22 NOTE — Progress Notes (Signed)
  Subjective:    Patient ID: Kimberly Carlson, female    DOB: 1946/07/31, 67 y.o.   MRN: [id number]  Diabetes  Hypertension    1. DM2 f/u. Patient states that her insurance wants her to change insulin to Levemir instead of Lantus. She has been taking Lantus 30 units in the morning. She notes her fasting blood sugars to be between one and 3 161. Her A1c is much improved from 10-7.5 today. She is very pleased as she should be. She denies any hypoglycemic episodes or any CBGs greater than 300, though she only checks blood sugars in the morning. She denies any pain or tingling or numbness in her feet. No wounds that are difficult to heal. She had her eyes checked today.   2. HLD. She needs refills on her statin medication. Is trying to do walking for exercise.  3. HTN. States she does not need refills on her Norvasc or enalapril. She denies any changes in her urination, headache, chest pain.  Review of Systems See HPI otherwise negative.  reports that she quit smoking about 2 years ago. Her smoking use included Cigarettes. She smoked 2.00 packs per day. She has never used smokeless tobacco.     Objective:   Physical Exam  Vitals reviewed. Constitutional: She is oriented to person, place, and time. She appears well-developed and well-nourished. No distress.  Cardiovascular: Normal rate, regular rhythm and normal heart sounds.   No murmur heard. Musculoskeletal: She exhibits no edema and no tenderness.  Feet appear within normal limits with no heavy callus buildup  Neurological: She is alert and oriented to person, place, and time.  Monofilament test intact bilaterally on the feet.  Skin: No rash noted. She is not diaphoretic.  Psychiatric: She has a normal mood and affect.        Assessment & Plan:

## 2013-04-22 NOTE — Patient Instructions (Signed)
OK to change the insulin to levemir.  Start at a lower dose of 25 units (down from 30 lantus you normally take). OK to go up on levemir by 1-2 units every few days if blood sugar are greater than 160. Make an appointment in 3 months for check up.  Please call if your blood sugars are <70, or >300.

## 2013-04-22 NOTE — Assessment & Plan Note (Signed)
I refilled her high potency statin medication.

## 2013-04-22 NOTE — Assessment & Plan Note (Signed)
Much improved control with initiating insulin this year. Per insurance requirements, we discussed changing from Lantus to Levemir. We will be conservative and decrease the dose by 5 units. She will start (25 units in the morning. We discussed her increasing the dose by one to 2 units every 3-5 days if her fasting sugar remains greater than 160. Counseled on the signs of hypoglycemia and when to check her blood sugars She will call for any hypo-or hyperglycemia. Eye exam within normal limits. Eyes were checked in clinic today. She refuses Pneumovax.

## 2013-04-22 NOTE — Assessment & Plan Note (Signed)
Blood pressure at goal for her age. She'll be due for labs at next visit, she refuses these today.

## 2013-04-22 NOTE — Addendum Note (Signed)
Addended by: Corinna Capra on: 04/22/2013 12:10 PM   Modules accepted: Orders

## 2013-04-29 ENCOUNTER — Encounter: Payer: Self-pay | Admitting: Family Medicine

## 2013-05-02 ENCOUNTER — Encounter: Payer: Self-pay | Admitting: Family Medicine

## 2013-05-27 ENCOUNTER — Encounter: Payer: Self-pay | Admitting: Sports Medicine

## 2013-05-27 ENCOUNTER — Ambulatory Visit (INDEPENDENT_AMBULATORY_CARE_PROVIDER_SITE_OTHER): Payer: Medicare Other | Admitting: Sports Medicine

## 2013-05-27 VITALS — BP 153/71 | HR 86 | Temp 98.0°F | Ht 63.0 in | Wt 195.0 lb

## 2013-05-27 DIAGNOSIS — R300 Dysuria: Secondary | ICD-10-CM

## 2013-05-27 DIAGNOSIS — N390 Urinary tract infection, site not specified: Secondary | ICD-10-CM | POA: Diagnosis not present

## 2013-05-27 DIAGNOSIS — E119 Type 2 diabetes mellitus without complications: Secondary | ICD-10-CM | POA: Diagnosis not present

## 2013-05-27 LAB — BASIC METABOLIC PANEL
BUN: 28 mg/dL — ABNORMAL HIGH (ref 6–23)
CO2: 27 mEq/L (ref 19–32)
Calcium: 9.7 mg/dL (ref 8.4–10.5)
Chloride: 103 mEq/L (ref 96–112)
Creat: 1.24 mg/dL — ABNORMAL HIGH (ref 0.50–1.10)
Glucose, Bld: 188 mg/dL — ABNORMAL HIGH (ref 70–99)
Potassium: 4.2 mEq/L (ref 3.5–5.3)
Sodium: 140 mEq/L (ref 135–145)

## 2013-05-27 LAB — POCT URINALYSIS DIPSTICK
Bilirubin, UA: NEGATIVE
Glucose, UA: NEGATIVE
Ketones, UA: NEGATIVE
Nitrite, UA: NEGATIVE
Protein, UA: 100
Spec Grav, UA: >=1.03
Urobilinogen, UA: 0.2
pH, UA: 5.5

## 2013-05-27 LAB — POCT UA - MICROSCOPIC ONLY

## 2013-05-27 LAB — CBC
HCT: 33 % — ABNORMAL LOW (ref 36.0–46.0)
Hemoglobin: 10.9 g/dL — ABNORMAL LOW (ref 12.0–15.0)
MCH: 29.1 pg (ref 26.0–34.0)
MCHC: 33 g/dL (ref 30.0–36.0)
MCV: 88 fL (ref 78.0–100.0)
Platelets: 222 10*3/uL (ref 150–400)
RBC: 3.75 MIL/uL — ABNORMAL LOW (ref 3.87–5.11)
RDW: 13.4 % (ref 11.5–15.5)
WBC: 9.7 10*3/uL (ref 4.0–10.5)

## 2013-05-27 MED ORDER — CEFTRIAXONE SODIUM 1 G IJ SOLR: 1.0000 g | Freq: Once | INTRAMUSCULAR | Status: DC

## 2013-05-27 MED ORDER — FLUCONAZOLE 150 MG PO TABS: 150.0000 mg | ORAL_TABLET | Freq: Once | ORAL | Status: DC

## 2013-05-27 MED ORDER — CEPHALEXIN 500 MG PO CAPS: 500.0000 mg | ORAL_CAPSULE | Freq: Three times a day (TID) | ORAL | Status: DC

## 2013-05-27 MED ORDER — CEFTRIAXONE SODIUM 1 G IJ SOLR
1.0000 g | Freq: Once | INTRAMUSCULAR | Status: AC
  Administered 2013-05-27: 1 g via INTRAMUSCULAR

## 2013-05-27 NOTE — Assessment & Plan Note (Addendum)
Rocephin 1g X 1 now given unclear organism and potential Upper Tract disease VSS otherwise stable. Check BMET for Cr. CBC to ensure not floridly infected Keflex until sensitivities return  > NOTE SPECIMEN DESTROYED PRIOR TO Culture Obtained.  Conitnue to monitor clinically and consider TOC given duration of symptoms.  Given red flags and return precautions

## 2013-05-27 NOTE — Progress Notes (Signed)
  [facility]  Patient name: Kimberly Carlson MRN [id number]  Date of birth: Apr 25, 1946  CC & HPI:  Kimberly Carlson is a 67 y.o. female presenting today for dysuria and frequency.  Reports sx have been present for >1 month.  Have been worsening over the past week.  Now developing left flank pain.  Denies: fevers, chills, nausea, vomiting, anorexia, rigors, or body aches.  No chest pain, dyspnea,   ROS:  Per HPI  Pertinent History Reviewed:  Medical & Surgical Hx:  Reviewed: Significant for HTN, DM,  Medications: Reviewed & Updated - see associated section Social History: Reviewed -  reports that she quit smoking about 2 years ago. Her smoking use included Cigarettes. She smoked 2.00 packs per day. She has never used smokeless tobacco.  Objective Findings:  Vitals: BP 153/71  Pulse 86  Temp(Src) 98 F (36.7 C) (Oral)  Ht 5\' 3"  (1.6 m)  Wt 195 lb (88.451 kg)  BMI 34.55 kg/m2  PE: GENERAL:  Elderly  female. In no discomfort; no respiratory distress. PSYCH: Alert and appropriately interactive; Insight:Fair   H&N: AT/[place], trachea midline EENT:  MMM, no scleral icterus, EOMi HEART: RRR, S1/S2 heard, no murmur LUNGS: CTA B, no wheezes, no crackles EXTREMITIES: Moves all 4 extremities spontaneously, warm well perfused, no edema, bilateral DP and PT pulses 2/4.      Assessment & Plan:

## 2013-05-27 NOTE — Patient Instructions (Addendum)

## 2013-06-01 ENCOUNTER — Telehealth: Payer: Self-pay | Admitting: Family Medicine

## 2013-06-01 NOTE — Telephone Encounter (Signed)
Pt reports extreme diarrhea while taking Keflex and would like to know if there is something else that she can take that would not be so irritating. [name], RN-BSN

## 2013-06-01 NOTE — Telephone Encounter (Signed)
This type of communication should not be left in EPIC without informing the doctor by other ways (pager). I am not actively accessing our electronic medical record 24/7. In the future if there is a message that needs response within working hours to make sure I received it please page me, so I have the chance to respond accordingly. Thanks.

## 2013-06-01 NOTE — Telephone Encounter (Signed)
Patient is calling after being seen last week by Dr. Paulla Fore and being prescribed Diflucan.  The medication is irritating her stomach and is causing her to have cramping and diarrhea and she wants to know what she can do to treat those symptoms or if Dr. Paulla Fore can call something in.

## 2013-06-01 NOTE — Telephone Encounter (Signed)
Patient is calling back because she didn't hear back anything on her symptoms and how to treat them.  She did call earlier today and scheduled an appointment for tomorrow so she will discuss them with her at that time.

## 2013-06-02 ENCOUNTER — Ambulatory Visit (INDEPENDENT_AMBULATORY_CARE_PROVIDER_SITE_OTHER): Payer: Medicare Other | Admitting: Family Medicine

## 2013-06-02 VITALS — BP 157/58 | HR 85 | Temp 98.1°F | Ht 63.0 in | Wt 197.0 lb

## 2013-06-02 DIAGNOSIS — E1165 Type 2 diabetes mellitus with hyperglycemia: Secondary | ICD-10-CM

## 2013-06-02 DIAGNOSIS — N390 Urinary tract infection, site not specified: Secondary | ICD-10-CM | POA: Diagnosis not present

## 2013-06-02 DIAGNOSIS — I10 Essential (primary) hypertension: Secondary | ICD-10-CM

## 2013-06-02 DIAGNOSIS — IMO0001 Reserved for inherently not codable concepts without codable children: Secondary | ICD-10-CM

## 2013-06-02 DIAGNOSIS — R197 Diarrhea, unspecified: Secondary | ICD-10-CM | POA: Diagnosis not present

## 2013-06-02 LAB — POCT URINALYSIS DIPSTICK
Bilirubin, UA: NEGATIVE
Glucose, UA: NEGATIVE
Ketones, UA: NEGATIVE
Nitrite, UA: NEGATIVE
Protein, UA: 30
Spec Grav, UA: 1.01
Urobilinogen, UA: 0.2
pH, UA: 5.5

## 2013-06-02 NOTE — Patient Instructions (Addendum)
It has been a pleasure to see you today. Please take the medications as prescribed. Please keep a log of your blood pressure, we may need to adjust your medications. I will speak with our pharmacist about Levemir vs lantus and I will call you with more informed answer. You can start taking yogurt and continue taking Keflex now with food, and also take Probiotics.  I will call you with the labs results if they come back abnormal otherwise we will discuss them at your next appointment. Make your next appointment in 2-3 weeks or sooner if needed.

## 2013-06-03 ENCOUNTER — Telehealth: Payer: Self-pay | Admitting: Sports Medicine

## 2013-06-03 LAB — URINE CULTURE
Colony Count: NO GROWTH
Organism ID, Bacteria: NO GROWTH

## 2013-06-03 NOTE — Assessment & Plan Note (Addendum)
On Keflex and reported diarrhea post abx use. Unsure if only side effect of abx or Cdiff  Etiology. P/ Discussed ways she can tried to minimize GI side effects even though they may interfere with proper absorption of medication. (take it with Food). C diff PCR or stool ordered Also since pt still has symptoms we will recheck urine and at this time UCx. It is reasonable in order to define speciation and narrow abx spectrum if needed.

## 2013-06-03 NOTE — Progress Notes (Signed)
Family Medicine Office Visit Note   Subjective:   Patient ID: Kimberly Carlson, female  DOB: 1946-09-13, 67 y.o.. MRN: [id number]   Pt that comes today complaining of diarrhea after antibiotic use and "gurgling" sensation on her left upper quadrant.  She was recently diagnosed with UTI and treated with Keflex, urine culture at that point was cancelled. She has been on this abx since last Wednesday and Thursday started with watery (non-bloody, non-mucous) diarrhea. She has continued with diarrhea but reports they have decreased in frequency. In the last 24h she reports 2 BM. She is able to eat and hydrate PO without difficulty, denies vomiting, but has felt lightheaded for the past 2 days.  Pt reports gurgling sensation on her left upper quadrant. Pt states "it is not pain, it is a weird sensation I don't not how to described it". It is always located on the left side, does not radiate and sometimes nausea is present.   Regarding her urinary symptoms she reports continues with frequency, but her dysuria and flank pain is better. She denies fever, chills or other systemic symptoms.  Her other concern  Include: Her change from Lantus to Levemir due to insurance coverage. Pt was administering Lantus 20 units and her CBG's were on the range of 120-140. With Levemir her CBG's are reported to be in the high 190's and 200's through the day even though pt increased dose from 25 to 35 units. She brings a very well detailed log of her CBG's since she started on insulin. Pt denies hypoglycemic events.   We also adressed her BP today and pt states that at home is ok but goes up every time she comes to the doctor. She stopped taking Amlodipine due to reported side effect of  headache. She is only taking HCTZ/Enalapril and reports compliance.   Review of Systems:  Per HPI  Objective:   Physical Exam: Gen:  NAD HEENT: Moist mucous membranes  CV: Regular rate and rhythm, no murmurs rubs or gallops PULM:  Clear to auscultation bilaterally. No wheezes/rales/rhonchi ABD: Obese, Soft, non tender, non distended, normal bowel sounds. EXT: No edema Neuro: Alert and oriented x3.  Assessment & Plan:

## 2013-06-03 NOTE — Assessment & Plan Note (Signed)
Not well controlled on Levamir at 35 units daily. P/ Discussed with our Pharmacist who recommends splitting the dose of Levamir to twice a day start with 10-12 units BID and f/u.

## 2013-06-03 NOTE — Telephone Encounter (Signed)
Per Dr. Waldemar Dickens came in for office visit on 06/02/13.  Nolene Ebbs, RN

## 2013-06-03 NOTE — Assessment & Plan Note (Signed)
With pt's diarrhea and lightheadedness probably form GI loses, will not recommend change in antihypertensive treatment at this time. P/ BP log and close f/u after pt acute illness has resolved.

## 2013-06-03 NOTE — Telephone Encounter (Signed)
Opened in error

## 2013-06-04 ENCOUNTER — Other Ambulatory Visit: Payer: Self-pay

## 2013-06-04 ENCOUNTER — Telehealth: Payer: Self-pay | Admitting: Family Medicine

## 2013-06-04 NOTE — Telephone Encounter (Signed)
Informed pt about negative results of her urine culture. She denies any more diarrhea or GI discomfort. F/u in 2-3 months or sooner if needed.

## 2013-06-23 ENCOUNTER — Ambulatory Visit: Payer: Medicare Other

## 2013-06-27 ENCOUNTER — Other Ambulatory Visit: Payer: Self-pay | Admitting: Family Medicine

## 2013-07-03 NOTE — Telephone Encounter (Signed)
The patient has been waiting for her refills since 8/2 and does not want to go the weekend without her meds.  Can someone please send in her refills.  She uses [facility] on [address].

## 2013-07-03 NOTE — Telephone Encounter (Signed)
One month supply called in - appointment needed before further refills. Glipizide and enlapril/hctz [name], RN-BSN

## 2013-07-09 ENCOUNTER — Encounter: Payer: Self-pay | Admitting: Family Medicine

## 2013-07-09 ENCOUNTER — Ambulatory Visit (INDEPENDENT_AMBULATORY_CARE_PROVIDER_SITE_OTHER): Payer: Medicare Other | Admitting: Family Medicine

## 2013-07-09 VITALS — BP 178/58 | HR 92 | Temp 98.6°F | Ht 63.0 in | Wt 195.0 lb

## 2013-07-09 DIAGNOSIS — N390 Urinary tract infection, site not specified: Secondary | ICD-10-CM

## 2013-07-09 DIAGNOSIS — IMO0001 Reserved for inherently not codable concepts without codable children: Secondary | ICD-10-CM

## 2013-07-09 DIAGNOSIS — R350 Frequency of micturition: Secondary | ICD-10-CM

## 2013-07-09 DIAGNOSIS — E1165 Type 2 diabetes mellitus with hyperglycemia: Secondary | ICD-10-CM

## 2013-07-09 LAB — POCT URINALYSIS DIPSTICK
Bilirubin, UA: NEGATIVE
Glucose, UA: 250
Ketones, UA: NEGATIVE
Nitrite, UA: NEGATIVE
Protein, UA: 100
Spec Grav, UA: 1.02
Urobilinogen, UA: 1
pH, UA: 5.5

## 2013-07-09 LAB — POCT UA - MICROSCOPIC ONLY: WBC, Ur, HPF, POC: >20

## 2013-07-09 NOTE — Progress Notes (Signed)
Family Medicine Office Visit Note   Subjective:   Patient ID: Kimberly Carlson, female  DOB: 1946-07-31, 67 y.o.. MRN: [id number]   Pt that comes today to follow up her recent UTI. She reports continues to have frequency but this is secondary to amount of water she is drinking. She reports drinking more water because she read that is "good for you" but no due to increase in thirst. She also denies dysuria, flank/abdominal pain, fever or chills. Her Urine culture from 06/02/13 was reported negative.   Her other issue today is her insulin. She continues using Levemir. She reports she has tried to use divided dosis but has missed her second dose multiple times because she forgets. Pt brings log of her CBG's with values on the 180's to 200's. She  Insists that Lantus was good for her and she would like to see if this medication can be approved by her insurance now.  Review of Systems:  Pt denies SOB, chest pain, palpitations, headaches, dizziness, numbness or weakness. No changes on urinary or BM habits. No unintentional weigh loss/gain.  Objective:   Physical Exam: Gen:  NAD HEENT: Moist mucous membranes  CV: Regular rate and rhythm, no murmurs rubs or gallops PULM: Clear to auscultation bilaterally. No wheezes/rales/rhonchi ABD: Soft, non tender, non distended, normal bowel sounds EXT: No edema Neuro: Alert and oriented x3. No focalization  Assessment & Plan:

## 2013-07-09 NOTE — Patient Instructions (Signed)
Please bring insurance information or paperwork was sent to you regarding Insulin coverage. Continue using Levemir. Remember it is important to divide dose and use medication TWICE a day as recommended until the situation with your insurance is more clear. If you develop burning with urination, pain in your back, malaise, fever, nausea or other symptoms come to get evaluated. You urinalysis today was hard to interpret since the sample is not the best. You prior urine culture was negative. Make a follow up appointment in 1-2 months or sooner if needed.

## 2013-07-09 NOTE — Assessment & Plan Note (Signed)
Continues to have frequency related to increase of water consumption (no secondary to thirst) P/ UA repeated only with WBC but not very good sample. Since pt's last culture did not grow bacteria will continue to monitor for symptoms and repeat UA/cultures if warranted.

## 2013-07-09 NOTE — Assessment & Plan Note (Addendum)
Last A1C on 7.5 in 03/2013. Uncontrolled now with CBG's in 200's per pt's blood sugar log. Even changing Levemir to twice a day has not worked for pt since she now forgets her second dose easily.  P/ Discussed in depth with pt. She will bring her insurance information for [place] to help with documentation needed in order to prescribe Lantus for her.

## 2013-07-23 ENCOUNTER — Other Ambulatory Visit: Payer: Self-pay | Admitting: Family Medicine

## 2013-07-24 MED ORDER — GLIPIZIDE ER 10 MG PO TB24: 10.0000 mg | ORAL_TABLET | Freq: Every day | ORAL | Status: DC

## 2013-07-24 MED ORDER — ENALAPRIL-HYDROCHLOROTHIAZIDE 10-25 MG PO TABS: ORAL_TABLET | ORAL | Status: DC

## 2013-08-03 ENCOUNTER — Other Ambulatory Visit: Payer: Self-pay | Admitting: Family Medicine

## 2013-08-03 ENCOUNTER — Encounter: Payer: Self-pay | Admitting: Family Medicine

## 2013-08-03 ENCOUNTER — Telehealth: Payer: Self-pay | Admitting: Family Medicine

## 2013-08-03 NOTE — Telephone Encounter (Signed)
Pt called because she is still awaiting the doctor to send in her enalapril to the pharmacy. JW

## 2013-08-03 NOTE — Telephone Encounter (Signed)
Checked the med and the MD clicked phone in so I called med to [facility] [address]

## 2013-09-02 ENCOUNTER — Encounter: Payer: Self-pay | Admitting: Family Medicine

## 2013-09-02 ENCOUNTER — Ambulatory Visit (INDEPENDENT_AMBULATORY_CARE_PROVIDER_SITE_OTHER): Payer: Medicare Other | Admitting: Family Medicine

## 2013-09-02 VITALS — BP 154/59 | HR 90 | Temp 99.0°F | Ht 63.0 in | Wt 197.9 lb

## 2013-09-02 DIAGNOSIS — I10 Essential (primary) hypertension: Secondary | ICD-10-CM | POA: Diagnosis not present

## 2013-09-02 DIAGNOSIS — E1165 Type 2 diabetes mellitus with hyperglycemia: Secondary | ICD-10-CM

## 2013-09-02 DIAGNOSIS — IMO0001 Reserved for inherently not codable concepts without codable children: Secondary | ICD-10-CM

## 2013-09-02 LAB — POCT GLYCOSYLATED HEMOGLOBIN (HGB A1C): Hemoglobin A1C: 7.8

## 2013-09-02 MED ORDER — ENALAPRIL-HYDROCHLOROTHIAZIDE 10-25 MG PO TABS: 1.0000 | ORAL_TABLET | Freq: Every day | ORAL | Status: DC

## 2013-09-02 NOTE — Patient Instructions (Signed)
It was nice to see you today.  I have refilled your medicine.  Your A1C today was 7.8.  Please increase your Levemir to 22 units daily (starting tomorrow).  You can increase 1 unit daily until your CBG is less than ~120.

## 2013-09-02 NOTE — Progress Notes (Signed)
Subjective:     Patient ID: Kimberly Carlson, female   DOB: Oct 25, 1946, 67 y.o.   MRN: [id number]  HPI 67 year old female with diabetes and hypertension presents to the clinic today for medication refill and A1c.  1) Medication refill - Patient requesting refill on enalapril/HCTZ as she is currently out.  - As also reported that she will be moving to [place] soon.  She will be establishing with a primary care doctor there.  2) Diabetes mellitus, type 2  Disease Monitoring  Blood Sugar Ranges: Patient checks every am (fasting); they have recently been elevated in  the 200's  Polyuria: no   Visual problems: no   Medication Compliance: yes; Levemir 20 Units daily, and Glipizide 10 mg daily Medication Side Effects  Hypoglycemia: no   Review of Systems Per HPI    Objective:   Physical Exam Filed Vitals:   09/02/13 1039  BP: 154/59  Pulse: 90  Temp: 99 F (37.2 C)  Exam: General: well appearing female in NAD.    Assessment:    See Problem list    Plan:

## 2013-09-02 NOTE — Assessment & Plan Note (Signed)
A1C 7.8 today. Levemir increased to 22 units daily (patient was taking 20). Advised incremental increase to achieve fasting <120.

## 2013-09-10 ENCOUNTER — Ambulatory Visit (INDEPENDENT_AMBULATORY_CARE_PROVIDER_SITE_OTHER): Payer: Medicare Other | Admitting: Family Medicine

## 2013-09-10 ENCOUNTER — Encounter: Payer: Self-pay | Admitting: Family Medicine

## 2013-09-10 VITALS — BP 134/74 | HR 98 | Temp 98.4°F | Wt 196.0 lb

## 2013-09-10 DIAGNOSIS — N390 Urinary tract infection, site not specified: Secondary | ICD-10-CM | POA: Diagnosis not present

## 2013-09-10 DIAGNOSIS — R300 Dysuria: Secondary | ICD-10-CM | POA: Diagnosis not present

## 2013-09-10 LAB — POCT UA - MICROSCOPIC ONLY: WBC, Ur, HPF, POC: >20

## 2013-09-10 LAB — POCT URINALYSIS DIPSTICK
Bilirubin, UA: NEGATIVE
Glucose, UA: 250
Ketones, UA: NEGATIVE
Nitrite, UA: NEGATIVE
Protein, UA: 100
Spec Grav, UA: 1.015
Urobilinogen, UA: 0.2
pH, UA: 5.5

## 2013-09-10 LAB — BASIC METABOLIC PANEL
BUN: 23 mg/dL (ref 6–23)
CO2: 27 mEq/L (ref 19–32)
Calcium: 9.6 mg/dL (ref 8.4–10.5)
Chloride: 101 mEq/L (ref 96–112)
Creat: 0.91 mg/dL (ref 0.50–1.10)
Glucose, Bld: 262 mg/dL — ABNORMAL HIGH (ref 70–99)
Potassium: 4.3 mEq/L (ref 3.5–5.3)
Sodium: 137 mEq/L (ref 135–145)

## 2013-09-10 MED ORDER — CIPROFLOXACIN HCL 500 MG PO TABS: 500.0000 mg | ORAL_TABLET | Freq: Two times a day (BID) | ORAL | Status: DC

## 2013-09-10 NOTE — Patient Instructions (Signed)
Antibiotic for 10 days. FOllow up immediately if you have fevers/chills/vomiting. See Dr. Thomes Dinning within the next month or so to follow up on chronic medical problems/needs and the following.  Health Maintenance Due  Topic Date Due  . Urine Microalbumin  12/05/1953  . Colonoscopy  12/05/1993  . Zostavax  12/06/2003  . Mammogram  08/13/2008  . Pneumococcal Polysaccharide Vaccine Age 69 And Over  12/05/2008  . Influenza Vaccine  06/26/2013  . Tetanus/tdap  07/30/2013     Urinary Tract Infection Urinary tract infections (UTIs) can develop anywhere along your urinary tract. Your urinary tract is your body's drainage system for removing wastes and extra water. Your urinary tract includes two kidneys, two ureters, a bladder, and a urethra. Your kidneys are a pair of bean-shaped organs. Each kidney is about the size of your fist. They are located below your ribs, one on each side of your spine. CAUSES Infections are caused by microbes, which are microscopic organisms, including fungi, viruses, and bacteria. These organisms are so small that they can only be seen through a microscope. Bacteria are the microbes that most commonly cause UTIs. SYMPTOMS  Symptoms of UTIs may vary by age and gender of the patient and by the location of the infection. Symptoms in young women typically include a frequent and intense urge to urinate and a painful, burning feeling in the bladder or urethra during urination. Older women and men are more likely to be tired, shaky, and weak and have muscle aches and abdominal pain. A fever may mean the infection is in your kidneys. Other symptoms of a kidney infection include pain in your back or sides below the ribs, nausea, and vomiting. DIAGNOSIS To diagnose a UTI, your caregiver will ask you about your symptoms. Your caregiver also will ask to provide a urine sample. The urine sample will be tested for bacteria and white blood cells. White blood cells are made by your body to  help fight infection. TREATMENT  Typically, UTIs can be treated with medication. Because most UTIs are caused by a bacterial infection, they usually can be treated with the use of antibiotics. The choice of antibiotic and length of treatment depend on your symptoms and the type of bacteria causing your infection. HOME CARE INSTRUCTIONS  If you were prescribed antibiotics, take them exactly as your caregiver instructs you. Finish the medication even if you feel better after you have only taken some of the medication.  Drink enough water and fluids to keep your urine clear or pale yellow.  Avoid caffeine, tea, and carbonated beverages. They tend to irritate your bladder.  Empty your bladder often. Avoid holding urine for long periods of time.  Empty your bladder before and after sexual intercourse.  After a bowel movement, women should cleanse from front to back. Use each tissue only once. SEEK MEDICAL CARE IF:   You have back pain.  You develop a fever.  Your symptoms do not begin to resolve within 3 days. SEEK IMMEDIATE MEDICAL CARE IF:   You have severe back pain or lower abdominal pain.  You develop chills.  You have nausea or vomiting.  You have continued burning or discomfort with urination. MAKE SURE YOU:   Understand these instructions.  Will watch your condition.  Will get help right away if you are not doing well or get worse. Document Released: 08/22/2005 Document Revised: 05/13/2012 Document Reviewed: 12/21/2011 [facility] Patient Information 2014 [place].

## 2013-09-11 ENCOUNTER — Telehealth: Payer: Self-pay | Admitting: *Deleted

## 2013-09-11 LAB — URINE CULTURE
Colony Count: NO GROWTH
Organism ID, Bacteria: NO GROWTH

## 2013-09-11 NOTE — Progress Notes (Signed)
Subjective:    Kimberly Carlson is a 67 y.o. female who complains of frequency and suprapubic pressure for 7 days.  Patient also complains of back pain, which actually was the first symptom and is typical for when she has UTIs. Not actually located over CVA but lateral to this area.  Patient has had at least 2 UTI in last year (though both cultures did not grow >100k although 2nd one was after some treatment).   Past medical history-Diabetes mellitus Type II with last a1c of 7.8. Patient admits sugars often at 200 which could be contributing to polyuria.   Review of Systems   Patient denies fever/chills/nausea/vomiting and vaginal discharge/dysuria. No fecal or urinary incontinence or saddle anesthesia or leg weakness.    Objective:    BP 134/74  Pulse 98  Temp(Src) 98.4 F (36.9 C) (Oral)  Wt 196 lb (88.905 kg)  BMI 34.73 kg/m2 General: alert and cooperative  Abdomen: soft, nondistended, normal bowel sounds and tenderness mild suprapubic.   Back: spine nontender, CVA tenderness absent. Very mild tenderness lateral to CVA at bottom of ribs (no trauma history or abrupt onset after a motion. Negative straight leg raise. Good ROM with back lumbar flexion and extension.   GU: defer exam   Laboratory:  Results for orders placed in visit on 09/10/13 (from the past 24 hour(s))  POCT URINALYSIS DIPSTICK     Status: Abnormal   Collection Time    09/10/13 11:38 AM      Result Value Range   Color, UA YELLOW     Clarity, UA CLEAR     Glucose, UA 250     Bilirubin, UA NEG     Ketones, UA NEG     Spec Grav, UA 1.015     Blood, UA TRACE-INTACT     pH, UA 5.5     Protein, UA 100     Urobilinogen, UA 0.2     Nitrite, UA NEG     Leukocytes, UA moderate (2+)     Narrative:    Reflex to microscopic   POCT UA - MICROSCOPIC ONLY     Status: Abnormal   Collection Time    09/10/13 11:38 AM      Result Value Range   WBC, Ur, HPF, POC >20     RBC, urine, microscopic 1-3     Bacteria, U  Microscopic 2+     Epithelial cells, urine per micros [id number]    BASIC METABOLIC PANEL     Status: Abnormal   Collection Time    09/10/13 12:27 PM      Result Value Range   Sodium 137  135 - 145 mEq/L   Potassium 4.3  3.5 - 5.3 mEq/L   Chloride 101  96 - 112 mEq/L   CO2 27  19 - 32 mEq/L   Glucose, Bld 262 (*) 70 - 99 mg/dL   BUN 23  6 - 23 mg/dL   Creat 0.91  0.50 - 1.10 mg/dL   Calcium 9.6  8.4 - 10.5 mg/dL   Narrative:    Performed at:  [id number]                [place], [place] [address]   .    Assessment:    Acute cystitis    Plan: Plan:    1. Medications: ciprofloxacin (x 10 days due  to DM) per patient request to not have keflex (diarrhea-warned this could have same side effect)  2. Maintain adequate hydration 3. Follow up if symptoms not improving, and prn.   No CVA tenderness/fevers/nausea to suggest pyelonephritis. Did send for urine culture due to several reported UTI in last year. Possibly just has a muscle strain with polyuria due to diabetes though suprapubic discomfort with UA findings points more towards UTI. BMET shows poorly controlled DM but creatinine is normal.

## 2013-09-11 NOTE — Telephone Encounter (Signed)
Message copied by Valerie Roys on Fri Sep 11, 2013 11:57 AM ------      Message from: Marin Olp      Created: Fri Sep 11, 2013 10:30 AM       [facility] team. Please let patient know kidney function was fine. She needs to follow up with Dr. Sheral Apley for her diabetes as her CBG was >250. ------

## 2013-09-11 NOTE — Telephone Encounter (Signed)
Left message for pt to call back.  Please give message from Dr. Yong Channel when she does.  Thanks [employer]

## 2013-09-15 ENCOUNTER — Encounter: Payer: Self-pay | Admitting: *Deleted

## 2013-09-15 NOTE — Telephone Encounter (Signed)
Unable to reach pt and letter was mailed. Jazmin Hartsell,CMA

## 2013-10-01 ENCOUNTER — Other Ambulatory Visit: Payer: Self-pay

## 2013-12-16 ENCOUNTER — Encounter: Payer: Self-pay | Admitting: Home Health Services

## 2013-12-16 ENCOUNTER — Telehealth: Payer: Self-pay | Admitting: Home Health Services

## 2013-12-16 NOTE — Telephone Encounter (Signed)
Sent mychart message to patient to schedule dm follow up visit with PCP-[facility].  Needs A1C and LDL check.

## 2013-12-21 DIAGNOSIS — Z888 Allergy status to other drugs, medicaments and biological substances status: Secondary | ICD-10-CM | POA: Diagnosis not present

## 2013-12-21 DIAGNOSIS — J329 Chronic sinusitis, unspecified: Secondary | ICD-10-CM | POA: Diagnosis not present

## 2013-12-21 DIAGNOSIS — N390 Urinary tract infection, site not specified: Secondary | ICD-10-CM | POA: Diagnosis not present

## 2013-12-21 DIAGNOSIS — J069 Acute upper respiratory infection, unspecified: Secondary | ICD-10-CM | POA: Diagnosis not present

## 2013-12-28 ENCOUNTER — Other Ambulatory Visit: Payer: Self-pay | Admitting: Family Medicine

## 2014-01-25 ENCOUNTER — Other Ambulatory Visit: Payer: Self-pay | Admitting: Family Medicine

## 2014-02-12 ENCOUNTER — Telehealth: Payer: Self-pay | Admitting: *Deleted

## 2014-02-12 NOTE — Telephone Encounter (Signed)
LM for patient to call back regarding her flu shot.  Please ask if she has received one and if so where.  Jazmin Hartsell,CMA

## 2014-02-18 DIAGNOSIS — Z0000 Encounter for general adult medical examination without abnormal findings: Secondary | ICD-10-CM | POA: Diagnosis not present

## 2014-02-18 DIAGNOSIS — M25569 Pain in unspecified knee: Secondary | ICD-10-CM | POA: Diagnosis not present

## 2014-02-18 DIAGNOSIS — N390 Urinary tract infection, site not specified: Secondary | ICD-10-CM | POA: Diagnosis not present

## 2014-02-18 DIAGNOSIS — R350 Frequency of micturition: Secondary | ICD-10-CM | POA: Diagnosis not present

## 2014-02-18 DIAGNOSIS — E119 Type 2 diabetes mellitus without complications: Secondary | ICD-10-CM | POA: Diagnosis not present

## 2014-02-23 DIAGNOSIS — E119 Type 2 diabetes mellitus without complications: Secondary | ICD-10-CM | POA: Diagnosis not present

## 2014-02-23 DIAGNOSIS — Z0000 Encounter for general adult medical examination without abnormal findings: Secondary | ICD-10-CM | POA: Diagnosis not present

## 2014-03-22 ENCOUNTER — Ambulatory Visit (INDEPENDENT_AMBULATORY_CARE_PROVIDER_SITE_OTHER): Payer: Medicare Other | Admitting: Family Medicine

## 2014-03-22 VITALS — BP 128/79 | HR 93 | Temp 98.9°F | Ht 63.0 in | Wt 202.0 lb

## 2014-03-22 DIAGNOSIS — E119 Type 2 diabetes mellitus without complications: Secondary | ICD-10-CM | POA: Diagnosis not present

## 2014-03-22 DIAGNOSIS — I10 Essential (primary) hypertension: Secondary | ICD-10-CM | POA: Diagnosis not present

## 2014-03-22 DIAGNOSIS — M25569 Pain in unspecified knee: Secondary | ICD-10-CM

## 2014-03-22 DIAGNOSIS — R350 Frequency of micturition: Secondary | ICD-10-CM

## 2014-03-22 DIAGNOSIS — IMO0002 Reserved for concepts with insufficient information to code with codable children: Secondary | ICD-10-CM

## 2014-03-22 DIAGNOSIS — IMO0001 Reserved for inherently not codable concepts without codable children: Secondary | ICD-10-CM | POA: Diagnosis not present

## 2014-03-22 DIAGNOSIS — N390 Urinary tract infection, site not specified: Secondary | ICD-10-CM | POA: Diagnosis not present

## 2014-03-22 DIAGNOSIS — M25561 Pain in right knee: Secondary | ICD-10-CM

## 2014-03-22 DIAGNOSIS — E1165 Type 2 diabetes mellitus with hyperglycemia: Secondary | ICD-10-CM

## 2014-03-22 DIAGNOSIS — M25562 Pain in left knee: Secondary | ICD-10-CM

## 2014-03-22 LAB — POCT GLYCOSYLATED HEMOGLOBIN (HGB A1C): Hemoglobin A1C: 6.6

## 2014-03-22 LAB — POCT URINALYSIS DIPSTICK
Bilirubin, UA: NEGATIVE
Glucose, UA: NEGATIVE
Ketones, UA: NEGATIVE
Nitrite, UA: NEGATIVE
Protein, UA: 100
Spec Grav, UA: 1.02
Urobilinogen, UA: 0.2
pH, UA: 6

## 2014-03-22 LAB — POCT UA - MICROSCOPIC ONLY

## 2014-03-22 MED ORDER — TRAMADOL HCL 50 MG PO TABS: 50.0000 mg | ORAL_TABLET | Freq: Three times a day (TID) | ORAL | Status: DC | PRN

## 2014-03-22 NOTE — Patient Instructions (Addendum)
We have order several labs and xrays. I will call you with results and we will discuss plan at that time. For pain you can take Acetaminophen(tylenol) extra strength 650 mg every 8 hours. You can also take Ibuprofen as needed for pain but limit this medication with your diabetes. I have prescribed you Tramadol you can use only for break through pain until we discuss the treatment options available after xray results. Regarding your urinary symptoms we will discuss after results of UA. If you develop fever, flank pain or dysuria please come for re-evaluation. Your Diabetes is controlled, as well as your Blood pressure please continue present therapy.

## 2014-03-23 NOTE — Progress Notes (Signed)
Family Medicine Office Visit Note   Subjective:   Patient ID: Kimberly Carlson, female  DOB: 1946-02-11, 68 y.o.. MRN: [id number]   Pt that comes today complaining bilateral knee pain. Se reports pain is present always and worsens at the end of the day. Denies redness or swelling. No other joints issues, no morning stiffness. Never has had steroid injections. Denies fever, chills, skin rashes or systemic symptoms. No history of trauma or fall.   She also comes for DM f/u she is on Insulin Lantus 25 units daily and glipizide 10mg  daily and reports no hypoglycemic events.  Her last concern for the day is frequency, she reports recent treatment for UTI (when she was out of town) and reports the dysuria has resolved but continues with frequency, denies flank pain.  Review of Systems:  Pt denies SOB, chest pain, palpitations, headaches, dizziness, numbness or weakness. No unintentional weigh loss/gain.  Objective:   Physical Exam: Gen:  NAD HEENT: Moist mucous membranes  CV: Regular rate and rhythm, no murmurs rubs or gallops PULM: Clear to auscultation bilaterally. No wheezes/rales/rhonchi ABD: Soft, non tender, non distended, normal bowel sounds. No CVA tenderness.  EXT: No edema, knee effusion or erythema. Knee left more than right but bilateral medial joint line tenderness with palpation. No joint laxity.  Neuro: Alert and oriented x3. No focalization  Assessment & Plan:

## 2014-03-24 DIAGNOSIS — M25561 Pain in right knee: Secondary | ICD-10-CM | POA: Insufficient documentation

## 2014-03-24 DIAGNOSIS — M25562 Pain in left knee: Secondary | ICD-10-CM | POA: Insufficient documentation

## 2014-03-24 NOTE — Assessment & Plan Note (Signed)
Most likely osteoarthritis but will obtain an xray standing. Oral therapy at this time and discussed next step in management with steroid injections if osteoarthritis is the diagnosis. Discussed nature of  condition and signs that should prompt re-evaluation.

## 2014-03-24 NOTE — Assessment & Plan Note (Signed)
Isolated frequency. No dysuria. Will repeat UA and f/u results.

## 2014-03-24 NOTE — Assessment & Plan Note (Signed)
A1C 6.6 at goal. No change on therapy.

## 2014-03-24 NOTE — Assessment & Plan Note (Signed)
Controlled. On ACEi.

## 2014-03-25 ENCOUNTER — Ambulatory Visit (HOSPITAL_COMMUNITY)
Admission: RE | Admit: 2014-03-25 | Discharge: 2014-03-25 | Disposition: A | Discharge status: Home / Self Care | Payer: Medicare Other | Source: Ambulatory Visit | Attending: Family Medicine | Admitting: Family Medicine

## 2014-03-25 DIAGNOSIS — IMO0002 Reserved for concepts with insufficient information to code with codable children: Secondary | ICD-10-CM | POA: Diagnosis not present

## 2014-03-25 DIAGNOSIS — M1710 Unilateral primary osteoarthritis, unspecified knee: Secondary | ICD-10-CM | POA: Diagnosis not present

## 2014-03-25 DIAGNOSIS — M25569 Pain in unspecified knee: Secondary | ICD-10-CM | POA: Diagnosis not present

## 2014-03-25 DIAGNOSIS — M25562 Pain in left knee: Secondary | ICD-10-CM

## 2014-03-26 ENCOUNTER — Telehealth: Payer: Self-pay | Admitting: Family Medicine

## 2014-03-26 NOTE — Telephone Encounter (Signed)
Called pt and left voice message regarding her xrays results. I did not explained details on an unsecured communication.  Pt has Osteoathritis of both knees and this can explain her symptoms. The next step in management will be steroid knee injections as we discussed during her visit. For this procedure she can make an appointment with me. If pt calls back please read this message to her and if she has more questions she can discuss them on a office visit. Thank you so much.

## 2014-07-04 ENCOUNTER — Encounter (HOSPITAL_COMMUNITY): Payer: Self-pay | Admitting: Emergency Medicine

## 2014-07-04 ENCOUNTER — Emergency Department (HOSPITAL_COMMUNITY): Payer: Medicare Other

## 2014-07-04 ENCOUNTER — Emergency Department (HOSPITAL_COMMUNITY)
Admission: EM | Admit: 2014-07-04 | Discharge: 2014-07-04 | Disposition: A | Discharge status: Home / Self Care | Payer: Medicare Other | Attending: Emergency Medicine | Admitting: Emergency Medicine

## 2014-07-04 DIAGNOSIS — Z8739 Personal history of other diseases of the musculoskeletal system and connective tissue: Secondary | ICD-10-CM | POA: Insufficient documentation

## 2014-07-04 DIAGNOSIS — E119 Type 2 diabetes mellitus without complications: Secondary | ICD-10-CM | POA: Insufficient documentation

## 2014-07-04 DIAGNOSIS — R0602 Shortness of breath: Secondary | ICD-10-CM | POA: Diagnosis not present

## 2014-07-04 DIAGNOSIS — I10 Essential (primary) hypertension: Secondary | ICD-10-CM | POA: Insufficient documentation

## 2014-07-04 DIAGNOSIS — Z87440 Personal history of urinary (tract) infections: Secondary | ICD-10-CM | POA: Insufficient documentation

## 2014-07-04 DIAGNOSIS — R002 Palpitations: Secondary | ICD-10-CM

## 2014-07-04 DIAGNOSIS — R350 Frequency of micturition: Secondary | ICD-10-CM | POA: Diagnosis not present

## 2014-07-04 DIAGNOSIS — Z7982 Long term (current) use of aspirin: Secondary | ICD-10-CM | POA: Diagnosis not present

## 2014-07-04 DIAGNOSIS — Z87891 Personal history of nicotine dependence: Secondary | ICD-10-CM | POA: Diagnosis not present

## 2014-07-04 DIAGNOSIS — Z794 Long term (current) use of insulin: Secondary | ICD-10-CM | POA: Diagnosis not present

## 2014-07-04 HISTORY — DX: Dorsalgia, unspecified: M54.9

## 2014-07-04 LAB — URINALYSIS, ROUTINE W REFLEX MICROSCOPIC
Bilirubin Urine: NEGATIVE
Glucose, UA: NEGATIVE mg/dL
Ketones, ur: NEGATIVE mg/dL
Nitrite: NEGATIVE
Protein, ur: 100 mg/dL — AB
Specific Gravity, Urine: 1.01 (ref 1.005–1.030)
Urobilinogen, UA: 1 mg/dL (ref 0.0–1.0)
pH: 7 (ref 5.0–8.0)

## 2014-07-04 LAB — CBC WITH DIFFERENTIAL/PLATELET
Basophils Absolute: 0 10*3/uL (ref 0.0–0.1)
Basophils Relative: 0 % (ref 0–1)
Eosinophils Absolute: 0.3 10*3/uL (ref 0.0–0.7)
Eosinophils Relative: 3 % (ref 0–5)
HCT: 31.3 % — ABNORMAL LOW (ref 36.0–46.0)
Hemoglobin: 10.2 g/dL — ABNORMAL LOW (ref 12.0–15.0)
Lymphocytes Relative: 29 % (ref 12–46)
Lymphs Abs: 3.5 10*3/uL (ref 0.7–4.0)
MCH: 29.4 pg (ref 26.0–34.0)
MCHC: 32.6 g/dL (ref 30.0–36.0)
MCV: 90.2 fL (ref 78.0–100.0)
Monocytes Absolute: 0.6 10*3/uL (ref 0.1–1.0)
Monocytes Relative: 5 % (ref 3–12)
Neutro Abs: 7.7 10*3/uL (ref 1.7–7.7)
Neutrophils Relative %: 63 % (ref 43–77)
Platelets: 212 10*3/uL (ref 150–400)
RBC: 3.47 MIL/uL — ABNORMAL LOW (ref 3.87–5.11)
RDW: 12.4 % (ref 11.5–15.5)
WBC: 12.1 10*3/uL — ABNORMAL HIGH (ref 4.0–10.5)

## 2014-07-04 LAB — COMPREHENSIVE METABOLIC PANEL
ALT: 20 U/L (ref 0–35)
AST: 24 U/L (ref 0–37)
Albumin: 3.4 g/dL — ABNORMAL LOW (ref 3.5–5.2)
Alkaline Phosphatase: 103 U/L (ref 39–117)
Anion gap: 14 (ref 5–15)
BUN: 22 mg/dL (ref 6–23)
CO2: 26 mEq/L (ref 19–32)
Calcium: 9.1 mg/dL (ref 8.4–10.5)
Chloride: 103 mEq/L (ref 96–112)
Creatinine, Ser: 1.06 mg/dL (ref 0.50–1.10)
GFR calc Af Amer: 60 mL/min — ABNORMAL LOW (ref >90)
GFR calc non Af Amer: 52 mL/min — ABNORMAL LOW (ref >90)
Glucose, Bld: 149 mg/dL — ABNORMAL HIGH (ref 70–99)
Potassium: 3.7 mEq/L (ref 3.7–5.3)
Sodium: 143 mEq/L (ref 137–147)
Total Bilirubin: <0.2 mg/dL — ABNORMAL LOW (ref 0.3–1.2)
Total Protein: 7.4 g/dL (ref 6.0–8.3)

## 2014-07-04 LAB — URINE MICROSCOPIC-ADD ON

## 2014-07-04 LAB — I-STAT TROPONIN, ED: Troponin i, poc: 0 ng/mL (ref 0.00–0.08)

## 2014-07-04 LAB — TSH: TSH: 0.726 u[IU]/mL (ref 0.350–4.500)

## 2014-07-04 MED ORDER — SODIUM CHLORIDE 0.9 % IV BOLUS (SEPSIS)
1000.0000 mL | Freq: Once | INTRAVENOUS | Status: AC
  Administered 2014-07-04: 1000 mL via INTRAVENOUS

## 2014-07-04 NOTE — ED Notes (Signed)
Called lab to add on a TSH.

## 2014-07-04 NOTE — Discharge Instructions (Signed)

## 2014-07-04 NOTE — ED Notes (Signed)
Pt. reports intermittent palpitations onset yesterday , denies chest pain , mild SOB with occasional dry cough , no nausea or diaphoresis .

## 2014-07-04 NOTE — ED Provider Notes (Signed)
Medical screening examination/treatment/procedure(s) were conducted as a shared visit with resident physician and myself.  I personally evaluated the patient during the encounter.    EKG Interpretation  Date/Time:  Sunday July 04 2014 19:50:44 EDT Ventricular Rate:  99 PR Interval:  176 QRS Duration: 82 QT Interval:  344 QTC Calculation: 441 R Axis:   65 Text Interpretation:  Normal sinus rhythm Normal ECG No significant change since last tracing Confirmed by Keali Mccraw  MD, Carmon Brigandi ([id number]) on 07/04/2014 9:34:05 PM      I interviewed and examined the patient. Lungs are CTAB. Cardiac exam wnl. Abdomen soft.  Sob only w/ exertion. Pt feels heart fluttering. ECG non-contrib. Labs/imaging non-contrib. Pt well appearing. Will rec f/u w/ cards.   Pamella Pert, MD 07/04/14 2337

## 2014-07-04 NOTE — ED Provider Notes (Signed)
CSN: [id number]     Arrival date & time 07/04/14  1946 History   First MD Initiated Contact with Patient 07/04/14 2008     Chief Complaint  Patient presents with  . Palpitations   Kimberly Carlson is a 68 yo AAF w/PMH of HTN, DM, and DDD who presents w/worsening DOE x 1 week and intermittent palpitations since yesterday. Pt can normally ambulate about 30 steps before becoming SOB, but now can only go about 5-10 steps before she has to rest. Also notes that she gets frequent UTIs and has felt increase in frequency of urination today. No other urinary sx.   She denies CP, fever, chills, N/V, abd pain, diarrhea, constipation, hematemesis, dysuria, hematuria, sick contacts, or recent travel.   (Consider location/radiation/quality/duration/timing/severity/associated sxs/prior Treatment) Patient is a 68 y.o. female presenting with palpitations.  Palpitations Palpitations quality:  Irregular Onset quality:  Sudden Timing:  Intermittent Chronicity:  New Context: not anxiety   Relieved by:  Nothing Worsened by:  Nothing tried Ineffective treatments:  None tried Associated symptoms: shortness of breath   Associated symptoms: no chest pain, no cough, no diaphoresis, no dizziness, no leg pain, no lower extremity edema, no nausea, no numbness, no syncope, no vomiting and no weakness   Risk factors: diabetes mellitus   Risk factors: no heart disease     Past Medical History  Diagnosis Date  . Hypertension   . Diabetes mellitus without complication   . Back pain    Past Surgical History  Procedure Laterality Date  . Abdominal hysterectomy     No family history on file. History  Substance Use Topics  . Smoking status: Former Smoker -- 2.00 packs/day    Types: Cigarettes    Quit date: 03/05/2011  . Smokeless tobacco: Never Used     Comment: Started in her 77's with multiple quit attempts for about 1 year at a time.  . Alcohol Use: No   OB History   Grav Para Term Preterm Abortions  TAB SAB Ect Mult Living                 Review of Systems  Constitutional: Negative for fever, chills and diaphoresis.  Respiratory: Positive for shortness of breath. Negative for cough, chest tightness, wheezing and stridor.   Cardiovascular: Positive for palpitations. Negative for chest pain, leg swelling and syncope.  Gastrointestinal: Negative for nausea, vomiting, abdominal pain, diarrhea, constipation and abdominal distention.  Genitourinary: Negative for dysuria, frequency, flank pain and decreased urine volume.  Neurological: Negative for dizziness, speech difficulty, light-headedness, numbness and headaches.  All other systems reviewed and are negative.     Allergies  Metformin  Home Medications   Prior to Admission medications   Medication Sig Start Date End Date Taking? Authorizing Provider  aspirin (ASPIRIN CHILDRENS) 81 MG chewable tablet Chew 81 mg by mouth daily.     Yes Historical Provider, MD  Blood Glucose Monitoring Suppl (ONE TOUCH ULTRA SYSTEM KIT) W/DEVICE KIT 1 kit by Does not apply route See admin instructions. Use to check blood sugar once daily.   Yes Historical Provider, MD  enalapril-hydrochlorothiazide (VASERETIC) 10-25 MG per tablet Take 1 tablet by mouth daily. For blood pressure 09/02/13  Yes Jayce G Cook, DO  glipiZIDE (GLUCOTROL XL) 10 MG 24 hr tablet Take 1 tablet (10 mg total) by mouth daily. 07/24/13  Yes Dayarmys Piloto de Gwendalyn Ege, MD  glucose blood (ONE TOUCH TEST STRIPS) test strip 1 each by Other route See admin instructions. Check  blood sugar once daily.   Yes Historical Provider, MD  ibuprofen (ADVIL,MOTRIN) 200 MG tablet Take 400 mg by mouth every 6 (six) hours as needed for headache or moderate pain.    Yes Historical Provider, MD  insulin glargine (LANTUS) 100 UNIT/ML injection Inject 23 Units into the skin daily.   Yes Historical Provider, MD  Insulin Syringe-Needle U-100 (INSULIN SYRINGE 1CC/31GX5/16") 31G X 5/16" 1 ML MISC 1 each by Does not  apply route See admin instructions. Use daily with insulin.   Yes Historical Provider, MD  ONE TOUCH LANCETS Artois 1 each by Does not apply route See admin instructions. Check blood sugar once daily.   Yes Historical Provider, MD  simvastatin (ZOCOR) 80 MG tablet Take 1 tablet (80 mg total) by mouth at bedtime. 04/22/13  Yes Clovis Cao, MD   BP 195/67  Pulse 90  Temp(Src) 98.5 F (36.9 C) (Oral)  Resp 20  Ht '5\' 3"'  (1.6 m)  Wt 195 lb (88.451 kg)  BMI 34.55 kg/m2  SpO2 98% Physical Exam  Nursing note and vitals reviewed. Constitutional: She is oriented to person, place, and time. She appears well-developed and well-nourished. No distress.  HENT:  Head: Normocephalic and atraumatic.  Cardiovascular: Normal rate, regular rhythm, normal heart sounds and intact distal pulses.  Exam reveals no gallop and no friction rub.   No murmur heard. Pulmonary/Chest: Effort normal and breath sounds normal. No respiratory distress. She has no wheezes. She has no rales. She exhibits no tenderness.  Abdominal: Soft. Bowel sounds are normal. She exhibits no distension and no mass. There is no tenderness. There is no rebound and no guarding.  Lymphadenopathy:    She has no cervical adenopathy.  Neurological: She is alert and oriented to person, place, and time.  Skin: Skin is warm and dry. She is not diaphoretic.    ED Course  Procedures (including critical care time) Labs Review Labs Reviewed  CBC WITH DIFFERENTIAL - Abnormal; Notable for the following:    WBC 12.1 (*)    RBC 3.47 (*)    Hemoglobin 10.2 (*)    HCT 31.3 (*)    All other components within normal limits  COMPREHENSIVE METABOLIC PANEL - Abnormal; Notable for the following:    Glucose, Bld 149 (*)    Albumin 3.4 (*)    Total Bilirubin <0.2 (*)    GFR calc non Af Amer 52 (*)    GFR calc Af Amer 60 (*)    All other components within normal limits  URINALYSIS, ROUTINE W REFLEX MICROSCOPIC - Abnormal; Notable for the following:     APPearance CLOUDY (*)    Hgb urine dipstick TRACE (*)    Protein, ur 100 (*)    Leukocytes, UA MODERATE (*)    All other components within normal limits  URINE MICROSCOPIC-ADD ON - Abnormal; Notable for the following:    Squamous Epithelial / LPF FEW (*)    All other components within normal limits  TSH  I-STAT TROPOININ, ED    Imaging Review Dg Chest 2 View  07/04/2014   CLINICAL DATA:  Chest palpitations and shortness of breath.  EXAM: CHEST  2 VIEW  COMPARISON:  Chest CT 09/27/2012.  FINDINGS: Lung volumes are normal. No consolidative airspace disease. No pleural effusions. No pneumothorax. No pulmonary nodule or mass noted. Pulmonary vasculature and the cardiomediastinal silhouette are within normal limits.  IMPRESSION: No radiographic evidence of acute cardiopulmonary disease.   Electronically Signed   By: Vinnie Langton  M.D.   On: 07/04/2014 21:24     EKG Interpretation   Date/Time:  Sunday July 04 2014 19:50:44 EDT Ventricular Rate:  99 PR Interval:  176 QRS Duration: 82 QT Interval:  344 QTC Calculation: 441 R Axis:   65 Text Interpretation:  Normal sinus rhythm Normal ECG No significant change  since last tracing Confirmed by HARRISON  MD, FORREST ([id number]) on 07/04/2014  9:34:05 PM      MDM   68 yo AAF w/DOE and palpitations. Please se HPI for details. On exam, pt in NAD, AFVSS aside from tachycardia. Pt feels no palpitations now. No hx of AFib or other arrhythmia. Exam completely unremarkable. EKG w/NSR w/slight tachycardia, no ischemia. CXR, CBC, BMP, TSH, trop pending. Will give bolus 1L NS. Cardiac monitoring.   Low risk for DVT/PE by Wells score. No clinical signs of DVT. Pt's HR trending downward, now in the 80s before fluids even begun.   Labs all wnl. No arrhythmias here. UA negative for UTI. Stable for DC home. Follow up to PCP this week for re-evaluation. May need cardiac monitor if palpitations continue. Discussed smoking cessation and decreasing caffeine  intake. Strict return precautions include any CP, SOB, diaphoresis, or presyncope/sycnope.  Final diagnoses:  Palpitations  SOB (shortness of breath)    Pt was seen under the supervision of Dr. Aline Brochure.     Sherian Maroon, MD 07/04/14 2312

## 2014-07-26 ENCOUNTER — Other Ambulatory Visit: Payer: Self-pay | Admitting: *Deleted

## 2014-07-26 MED ORDER — GLIPIZIDE ER 10 MG PO TB24: 10.0000 mg | ORAL_TABLET | Freq: Every day | ORAL | Status: DC

## 2014-07-27 ENCOUNTER — Other Ambulatory Visit: Payer: Self-pay | Admitting: Family Medicine

## 2014-08-04 ENCOUNTER — Ambulatory Visit: Payer: Medicare Other | Admitting: Family Medicine

## 2014-08-23 ENCOUNTER — Other Ambulatory Visit: Payer: Self-pay | Admitting: *Deleted

## 2014-08-23 MED ORDER — ENALAPRIL-HYDROCHLOROTHIAZIDE 10-25 MG PO TABS: 1.0000 | ORAL_TABLET | Freq: Every day | ORAL | Status: DC

## 2014-11-05 ENCOUNTER — Ambulatory Visit (INDEPENDENT_AMBULATORY_CARE_PROVIDER_SITE_OTHER): Payer: Medicare Other | Admitting: Family Medicine

## 2014-11-05 ENCOUNTER — Encounter: Payer: Self-pay | Admitting: Family Medicine

## 2014-11-05 VITALS — BP 142/62 | HR 88 | Temp 98.4°F | Ht 63.0 in | Wt 200.6 lb

## 2014-11-05 DIAGNOSIS — N3001 Acute cystitis with hematuria: Secondary | ICD-10-CM

## 2014-11-05 DIAGNOSIS — IMO0002 Reserved for concepts with insufficient information to code with codable children: Secondary | ICD-10-CM

## 2014-11-05 DIAGNOSIS — R300 Dysuria: Secondary | ICD-10-CM

## 2014-11-05 DIAGNOSIS — E1165 Type 2 diabetes mellitus with hyperglycemia: Secondary | ICD-10-CM | POA: Diagnosis not present

## 2014-11-05 DIAGNOSIS — I10 Essential (primary) hypertension: Secondary | ICD-10-CM

## 2014-11-05 LAB — POCT URINALYSIS DIPSTICK
Bilirubin, UA: NEGATIVE
Glucose, UA: NEGATIVE
Ketones, UA: NEGATIVE
Nitrite, UA: NEGATIVE
Protein, UA: 100
Spec Grav, UA: 1.01
Urobilinogen, UA: 0.2
pH, UA: 7

## 2014-11-05 LAB — POCT GLYCOSYLATED HEMOGLOBIN (HGB A1C): Hemoglobin A1C: 6.1

## 2014-11-05 LAB — POCT UA - MICROSCOPIC ONLY

## 2014-11-05 MED ORDER — CEPHALEXIN 500 MG PO CAPS: 500.0000 mg | ORAL_CAPSULE | Freq: Two times a day (BID) | ORAL | Status: DC

## 2014-11-05 NOTE — Progress Notes (Signed)
Subjective:     Patient ID: Kimberly Carlson, female   DOB: 1946/01/23, 68 y.o.   MRN: [id number]  HPI Kimberly Carlson is a 68yo female presenting today for diabetes follow up.  # Diabetes: - Currently prescribed Glipizide 10mg  and Lantus 25units daily  - States she has been taking this regimen daily - Last A1C 6.6 at last visit in April 2015 - Sugar at home is usually in low 100s, however it occasionally goes up to 180 - Refuses pneumonia and flu vaccine  # UTI - Has been experiencing increased urinary frequency for 1 month - States she has history of UTIs that will present this way. Past several have not been associated with pain, but instead manifested as increased frequency. - Unsure which antibiotics have worked for her in th past, however states Clindamycin results in abdominal pain - Denies dysuria, fever, flank pain  # Shortness of Breath - Has experienced shortness of breath with exertion for the past several years - Is not worsening. Stable. - Denies chest pain, palpitations, edema, orthopnea - Believes it is due to deconditioning  Review of Systems  Constitutional: Negative for fever and chills.  Respiratory: Positive for shortness of breath.   Cardiovascular: Negative for chest pain and palpitations.  Genitourinary: Negative for dysuria.   Social History Reviewed    Objective:   Physical Exam  Constitutional: She is oriented to person, place, and time. She appears well-developed and well-nourished. No distress.  HENT:  Head: Normocephalic and atraumatic.  Right Ear: Tympanic membrane normal.  Left Ear: Tympanic membrane normal.  Cardiovascular: Normal rate and regular rhythm.  Exam reveals no gallop and no friction rub.   No murmur heard. Pulmonary/Chest: Effort normal. No respiratory distress. She has no wheezes. She has no rales.  Abdominal: Soft. She exhibits no distension. There is no tenderness.  Musculoskeletal: She exhibits no edema.  Neurological: She is  alert and oriented to person, place, and time.  Foot exam showed no decreased foot sensation.  Skin:  Foot exam showed no ulcerations       Assessment:     Please refer to Problem List for Assessment.     Plan:     Please refer to Problem List for Plan.

## 2014-11-05 NOTE — Assessment & Plan Note (Addendum)
-   A1C of 6.1 today, down from 6.6 at last visit in April 2015. - Continue current regimen of Glipizide 10mg  and Lantus 23units - Refused Pneumonia and Flu vaccine today despite counseling - Return in 9-41months for followup of diabetes and repeat A1C

## 2014-11-05 NOTE — Patient Instructions (Addendum)
Thank you so much for coming to visit me today!  Your A1C was amazing today at 6.1! We will continue the medications you are currently on.  Keep up the good work and return in 9-76months for your next A1C check.  Your urine showed infection, so I have sent in an antibiotic for you to use twice a day for three days. Please let me know if your symptoms do not improve!  Your blood pressure was much better when rechecked today!  Thanks again! Dr. Gerlean Ren

## 2014-11-05 NOTE — Assessment & Plan Note (Signed)
-   Urinalysis today showed Large Leukocytes. Microscopy showed 2+ bacteria, 20-30 WBC, and 5-10 epithelial ells - Follow up Urine Culture - Keflex 500mg  BID x7days - Follow up if no improvement.

## 2014-11-06 LAB — URINE CULTURE: Colony Count: 9000

## 2014-11-06 NOTE — Progress Notes (Signed)
Dr Gerlean Ren did you address patient's BP? If not please have her come back for nurse visit next week. Thanks.

## 2014-11-07 NOTE — Assessment & Plan Note (Addendum)
-   Initial BP 190/96 - Recheck BP 142/62 - States she normally checks her blood pressure at home and it is well controlled. Instructed to continue to check BP at home and call the office if her blood pressure remains elevated. - Continue Enalapril-HCTZ

## 2014-11-17 ENCOUNTER — Encounter: Payer: Self-pay | Admitting: Family Medicine

## 2014-11-22 ENCOUNTER — Encounter: Payer: Self-pay | Admitting: Family Medicine

## 2014-11-23 ENCOUNTER — Telehealth: Payer: Self-pay | Admitting: *Deleted

## 2014-11-23 DIAGNOSIS — H2513 Age-related nuclear cataract, bilateral: Secondary | ICD-10-CM | POA: Diagnosis not present

## 2014-11-23 DIAGNOSIS — H43811 Vitreous degeneration, right eye: Secondary | ICD-10-CM | POA: Diagnosis not present

## 2014-11-23 MED ORDER — CEPHALEXIN 500 MG PO CAPS: 500.0000 mg | ORAL_CAPSULE | Freq: Two times a day (BID) | ORAL | Status: DC

## 2014-11-23 NOTE — Telephone Encounter (Signed)
LVM for pt to return call to inform her of message below regarding a Rx. Kimberly Carlson, Kimberly Carlson

## 2014-11-23 NOTE — Telephone Encounter (Signed)
I need some more medication because I am still having problems and I am getting worse. Tried to get an appointment but no such luck. Will you please call me in some medication. This is my second time sending you a message.             Thank you,        Kimberly Carlson

## 2014-11-23 NOTE — Telephone Encounter (Signed)
A ten day course of Keflex (500mg  twice a day, twenty tablets total) has been sent to pharmacy. If no improvement, she needs to schedule an appointment to be seen.

## 2014-12-22 ENCOUNTER — Encounter: Payer: Self-pay | Admitting: Family Medicine

## 2014-12-27 ENCOUNTER — Other Ambulatory Visit: Payer: Self-pay | Admitting: *Deleted

## 2014-12-27 MED ORDER — ENALAPRIL-HYDROCHLOROTHIAZIDE 10-25 MG PO TABS: 1.0000 | ORAL_TABLET | Freq: Every day | ORAL | Status: DC

## 2015-01-04 DIAGNOSIS — H43811 Vitreous degeneration, right eye: Secondary | ICD-10-CM | POA: Diagnosis not present

## 2015-01-04 DIAGNOSIS — H2513 Age-related nuclear cataract, bilateral: Secondary | ICD-10-CM | POA: Diagnosis not present

## 2015-02-01 ENCOUNTER — Emergency Department (INDEPENDENT_AMBULATORY_CARE_PROVIDER_SITE_OTHER)
Admission: EM | Admit: 2015-02-01 | Discharge: 2015-02-01 | Disposition: A | Discharge status: Home / Self Care | Payer: Medicare Other | Source: Home / Self Care | Attending: Family Medicine | Admitting: Family Medicine

## 2015-02-01 ENCOUNTER — Encounter (HOSPITAL_COMMUNITY): Payer: Self-pay | Admitting: *Deleted

## 2015-02-01 DIAGNOSIS — E1169 Type 2 diabetes mellitus with other specified complication: Secondary | ICD-10-CM | POA: Diagnosis not present

## 2015-02-01 DIAGNOSIS — N390 Urinary tract infection, site not specified: Secondary | ICD-10-CM

## 2015-02-01 LAB — POCT I-STAT, CHEM 8
BUN: 29 mg/dL — ABNORMAL HIGH (ref 6–23)
Calcium, Ion: 1.16 mmol/L (ref 1.13–1.30)
Chloride: 105 mmol/L (ref 96–112)
Creatinine, Ser: 1.3 mg/dL — ABNORMAL HIGH (ref 0.50–1.10)
Glucose, Bld: 104 mg/dL — ABNORMAL HIGH (ref 70–99)
HCT: 33 % — ABNORMAL LOW (ref 36.0–46.0)
Hemoglobin: 11.2 g/dL — ABNORMAL LOW (ref 12.0–15.0)
Potassium: 4.1 mmol/L (ref 3.5–5.1)
Sodium: 140 mmol/L (ref 135–145)
TCO2: 23 mmol/L (ref 0–100)

## 2015-02-01 LAB — POCT URINALYSIS DIP (DEVICE)
Bilirubin Urine: NEGATIVE
Glucose, UA: NEGATIVE mg/dL
Ketones, ur: NEGATIVE mg/dL
Nitrite: NEGATIVE
Protein, ur: 100 mg/dL — AB
Specific Gravity, Urine: 1.015 (ref 1.005–1.030)
Urobilinogen, UA: 0.2 mg/dL (ref 0.0–1.0)
pH: 5.5 (ref 5.0–8.0)

## 2015-02-01 MED ORDER — CEPHALEXIN 500 MG PO CAPS: 500.0000 mg | ORAL_CAPSULE | Freq: Four times a day (QID) | ORAL | Status: DC

## 2015-02-01 NOTE — ED Provider Notes (Signed)
CSN: [id number]     Arrival date & time 02/01/15  1609 History   First MD Initiated Contact with Patient 02/01/15 1708     Chief Complaint  Patient presents with  . Dizziness  . Headache   (Consider location/radiation/quality/duration/timing/severity/associated sxs/prior Treatment) HPI Comments: 69 year old female with a current history of type 2 diabetes mellitus treated with glipizide and Lantus insulin and hypertension states that 48 hours ago she had dizziness and a headache. Associated with this was a sense of discoordination and dropping things. Most of the symptoms have resolved. She has no headache now but does have dizziness. Associated symptoms include feeling cold but without fever, right eye feeling funny without having pain or visual disturbance, heaviness in the epigastrium for one year without recent change. Sometimes at homes she states that she occasionally feels confused for "a long time" and has the shakes, these are chronic. She is also complaining of urinary frequency without dysuria. She has a history of frequent UTIs.   Past Medical History  Diagnosis Date  . Hypertension   . Diabetes mellitus without complication   . Back pain     herniated disc lower back   Past Surgical History  Procedure Laterality Date  . Abdominal hysterectomy     Family History  Problem Relation Age of Onset  . Adopted: Yes   History  Substance Use Topics  . Smoking status: Current Every Day Smoker -- 1.00 packs/day    Types: Cigarettes    Start date: 04/26/2014  . Smokeless tobacco: Never Used     Comment: Started in her 70's with multiple quit attempts for about 1 year at a time.  . Alcohol Use: No   OB History    No data available     Review of Systems  Constitutional: Positive for fatigue. Negative for fever and activity change.  HENT: Negative for congestion, postnasal drip, rhinorrhea, sneezing, sore throat and trouble swallowing.   Eyes: Negative for pain and visual  disturbance.  Respiratory: Negative for cough, shortness of breath and wheezing.   Cardiovascular: Negative for chest pain, palpitations and leg swelling.  Gastrointestinal: Negative for nausea and vomiting.       Chronic heaviness in the epigastrium for over one year. No change in the character, timing, intensity or location.  Genitourinary: Positive for urgency and frequency. Negative for dysuria, hematuria, flank pain, vaginal discharge and pelvic pain.  Musculoskeletal: Negative for back pain.  Skin: Negative.   Neurological: Positive for dizziness, tremors and headaches. Negative for syncope.       Essential tremor and intentional tremor of the upper extremities and right lower extremity.    Allergies  Metformin  Home Medications   Prior to Admission medications   Medication Sig Start Date End Date Taking? Authorizing Provider  aspirin (ASPIRIN CHILDRENS) 81 MG chewable tablet Chew 81 mg by mouth daily.     Yes Historical Provider, MD  Blood Glucose Monitoring Suppl (ONE TOUCH ULTRA SYSTEM KIT) W/DEVICE KIT 1 kit by Does not apply route See admin instructions. Use to check blood sugar once daily.   Yes Historical Provider, MD  enalapril-hydrochlorothiazide (VASERETIC) 10-25 MG per tablet Take 1 tablet by mouth daily. For blood pressure 12/27/14  Yes [place] N Rumley, DO  glipiZIDE (GLUCOTROL XL) 10 MG 24 hr tablet Take 1 tablet (10 mg total) by mouth daily. 07/26/14  Yes  N Rumley, DO  glucose blood (ONE TOUCH TEST STRIPS) test strip 1 each by Other route See admin instructions. Check  blood sugar once daily.   Yes Historical Provider, MD  insulin glargine (LANTUS) 100 UNIT/ML injection Inject 23 Units into the skin daily.   Yes Historical Provider, MD  Insulin Syringe-Needle U-100 (INSULIN SYRINGE 1CC/31GX5/16") 31G X 5/16" 1 ML MISC 1 each by Does not apply route See admin instructions. Use daily with insulin.   Yes Historical Provider, MD  ONE TOUCH LANCETS [place] 1 each by Does  not apply route See admin instructions. Check blood sugar once daily.   Yes Historical Provider, MD  cephALEXin (KEFLEX) 500 MG capsule Take 1 capsule (500 mg total) by mouth 4 (four) times daily. 02/01/15   Janne Napoleon, NP  ibuprofen (ADVIL,MOTRIN) 200 MG tablet Take 400 mg by mouth every 6 (six) hours as needed for headache or moderate pain.     Historical Provider, MD   BP 189/78 mmHg  Pulse 96  Temp(Src) 98.4 F (36.9 C) (Oral)  Resp 12  SpO2 97% Physical Exam  Constitutional: She is oriented to person, place, and time. She appears well-developed and well-nourished. No distress.  HENT:  Head: Normocephalic and atraumatic.  Mouth/Throat: Oropharynx is clear and moist. No oropharyngeal exudate.  Soft palate rises symmetrically. Uvula midline without swelling Swallowing reflex intact.  Eyes: Conjunctivae and EOM are normal. Pupils are equal, round, and reactive to light. Right eye exhibits no discharge. Left eye exhibits no discharge.  Neck: Normal range of motion. Neck supple.  Cardiovascular: Normal rate, normal heart sounds and intact distal pulses.   Pulmonary/Chest: Effort normal and breath sounds normal. No respiratory distress. She has no wheezes. She has no rales. She exhibits no tenderness.  Abdominal: Soft. Bowel sounds are normal. There is no rebound and no guarding.  Abdomen is soft, obese and firm. Palpation is dull, mildly resident in the epigastrium.  Musculoskeletal: Normal range of motion.  Lymphadenopathy:    She has no cervical adenopathy.  Neurological: She is alert and oriented to person, place, and time. No cranial nerve deficit. She exhibits normal muscle tone.  Skin: Skin is warm and dry.  Psychiatric: She has a normal mood and affect.  Nursing note and vitals reviewed.   ED Course  Procedures (including critical care time) Labs Review Labs Reviewed  POCT URINALYSIS DIP (DEVICE) - Abnormal; Notable for the following:    Hgb urine dipstick TRACE (*)     Protein, ur 100 (*)    Leukocytes, UA LARGE (*)    All other components within normal limits  POCT I-STAT, CHEM 8 - Abnormal; Notable for the following:    BUN 29 (*)    Creatinine, Ser 1.30 (*)    Glucose, Bld 104 (*)    Hemoglobin 11.2 (*)    HCT 33.0 (*)    All other components within normal limits    Imaging Review No results found. EKG: Normal sinus rhythm. No ectopy. A change from previous EKG. Ventricular rate 74. No ST T changes. PR interval 172 ms.  MDM   1. UTI (lower urinary tract infection)   2. Type 2 diabetes mellitus with other specified complication    Patient's UTI will be treated with Keflex Drink plenty of fluids  Continue monitoring blood sugars Silverio Lay doctor for recheck in 3 days For worsening new symptoms or problems may return or go to the emergency department.   Janne Napoleon, NP 02/01/15 1835  Janne Napoleon, NP 02/01/15 [phone]

## 2015-02-01 NOTE — Discharge Instructions (Signed)

## 2015-02-01 NOTE — ED Notes (Signed)
C/o dizziness onset Sunday with headache and shakiness. States she felt cold, like a chill and no energy.

## 2015-02-03 LAB — URINE CULTURE
Colony Count: NO GROWTH
Culture: NO GROWTH

## 2015-02-17 ENCOUNTER — Other Ambulatory Visit: Payer: Self-pay | Admitting: Family Medicine

## 2015-02-17 ENCOUNTER — Encounter: Payer: Self-pay | Admitting: Family Medicine

## 2015-02-17 ENCOUNTER — Ambulatory Visit (INDEPENDENT_AMBULATORY_CARE_PROVIDER_SITE_OTHER): Payer: Medicare Other | Admitting: Family Medicine

## 2015-02-17 VITALS — BP 198/82 | HR 104 | Temp 98.2°F | Ht 63.0 in | Wt 195.2 lb

## 2015-02-17 DIAGNOSIS — Z87898 Personal history of other specified conditions: Secondary | ICD-10-CM

## 2015-02-17 DIAGNOSIS — R258 Other abnormal involuntary movements: Secondary | ICD-10-CM

## 2015-02-17 DIAGNOSIS — R399 Unspecified symptoms and signs involving the genitourinary system: Secondary | ICD-10-CM

## 2015-02-17 DIAGNOSIS — I10 Essential (primary) hypertension: Secondary | ICD-10-CM | POA: Diagnosis not present

## 2015-02-17 DIAGNOSIS — R002 Palpitations: Secondary | ICD-10-CM | POA: Diagnosis not present

## 2015-02-17 DIAGNOSIS — G252 Other specified forms of tremor: Secondary | ICD-10-CM

## 2015-02-17 DIAGNOSIS — E1165 Type 2 diabetes mellitus with hyperglycemia: Secondary | ICD-10-CM

## 2015-02-17 DIAGNOSIS — IMO0002 Reserved for concepts with insufficient information to code with codable children: Secondary | ICD-10-CM

## 2015-02-17 DIAGNOSIS — F1410 Cocaine abuse, uncomplicated: Secondary | ICD-10-CM | POA: Diagnosis not present

## 2015-02-17 DIAGNOSIS — I160 Hypertensive urgency: Secondary | ICD-10-CM

## 2015-02-17 DIAGNOSIS — R259 Unspecified abnormal involuntary movements: Principal | ICD-10-CM

## 2015-02-17 DIAGNOSIS — F1491 Cocaine use, unspecified, in remission: Secondary | ICD-10-CM

## 2015-02-17 LAB — POCT URINALYSIS DIPSTICK
Bilirubin, UA: NEGATIVE
Glucose, UA: NEGATIVE
Ketones, UA: NEGATIVE
Nitrite, UA: NEGATIVE
Protein, UA: 100
Spec Grav, UA: 1.02
Urobilinogen, UA: 0.2
pH, UA: 5.5

## 2015-02-17 LAB — POCT UA - MICROSCOPIC ONLY

## 2015-02-17 LAB — CBC WITH DIFFERENTIAL/PLATELET
Basophils Absolute: 0 10*3/uL (ref 0.0–0.1)
Basophils Relative: 0 % (ref 0–1)
Eosinophils Absolute: 0.3 10*3/uL (ref 0.0–0.7)
Eosinophils Relative: 3 % (ref 0–5)
HCT: 31.9 % — ABNORMAL LOW (ref 36.0–46.0)
Hemoglobin: 10.6 g/dL — ABNORMAL LOW (ref 12.0–15.0)
Lymphocytes Relative: 30 % (ref 12–46)
Lymphs Abs: 2.8 10*3/uL (ref 0.7–4.0)
MCH: 29.4 pg (ref 26.0–34.0)
MCHC: 33.2 g/dL (ref 30.0–36.0)
MCV: 88.6 fL (ref 78.0–100.0)
MPV: 11.8 fL (ref 8.6–12.4)
Monocytes Absolute: 0.5 10*3/uL (ref 0.1–1.0)
Monocytes Relative: 5 % (ref 3–12)
Neutro Abs: 5.8 10*3/uL (ref 1.7–7.7)
Neutrophils Relative %: 62 % (ref 43–77)
Platelets: 216 10*3/uL (ref 150–400)
RBC: 3.6 MIL/uL — ABNORMAL LOW (ref 3.87–5.11)
RDW: 12.8 % (ref 11.5–15.5)
WBC: 9.3 10*3/uL (ref 4.0–10.5)

## 2015-02-17 LAB — COMPREHENSIVE METABOLIC PANEL
ALT: 15 U/L (ref 0–35)
AST: 18 U/L (ref 0–37)
Albumin: 3.9 g/dL (ref 3.5–5.2)
Alkaline Phosphatase: 93 U/L (ref 39–117)
BUN: 34 mg/dL — ABNORMAL HIGH (ref 6–23)
CO2: 24 mEq/L (ref 19–32)
Calcium: 9.3 mg/dL (ref 8.4–10.5)
Chloride: 103 mEq/L (ref 96–112)
Creat: 1.2 mg/dL — ABNORMAL HIGH (ref 0.50–1.10)
Glucose, Bld: 109 mg/dL — ABNORMAL HIGH (ref 70–99)
Potassium: 4 mEq/L (ref 3.5–5.3)
Sodium: 140 mEq/L (ref 135–145)
Total Bilirubin: 0.3 mg/dL (ref 0.2–1.2)
Total Protein: 6.7 g/dL (ref 6.0–8.3)

## 2015-02-17 LAB — POCT GLYCOSYLATED HEMOGLOBIN (HGB A1C): Hemoglobin A1C: 6.1

## 2015-02-17 MED ORDER — AMLODIPINE BESYLATE 5 MG PO TABS: 5.0000 mg | ORAL_TABLET | Freq: Every day | ORAL | Status: DC

## 2015-02-17 NOTE — Patient Instructions (Signed)
It was great seeing you today.   I have order some labs today to check on your high blood pressure and tremors. I will send you a letter with the results, or call you if we need to make any changes to your current therapies.  1. Stop taking Glipizide  2. Start Norvasc 5mg  daily for you blood pressure   Please bring all your medications to every doctors visit  Next Appointment  You have an appointment with Dr Berkley Harvey Monday @ 2:15pm   I look forward to talking with you again at our next visit. If you have any questions or concerns before then, please call the clinic at [phone].  Take Care,   Dr Phill Myron

## 2015-02-18 ENCOUNTER — Encounter: Payer: Self-pay | Admitting: Family Medicine

## 2015-02-18 LAB — DRUG SCREEN, URINE
Amphetamine Screen, Ur: NEGATIVE
Barbiturate Quant, Ur: NEGATIVE
Benzodiazepines.: NEGATIVE
Cocaine Metabolites: NEGATIVE
Creatinine,U: 80.5 mg/dL
Marijuana Metabolite: NEGATIVE
Methadone: NEGATIVE
Opiates: NEGATIVE
Phencyclidine (PCP): NEGATIVE
Propoxyphene: NEGATIVE

## 2015-02-18 LAB — TSH: TSH: 0.681 u[IU]/mL (ref 0.350–4.500)

## 2015-02-18 NOTE — Assessment & Plan Note (Signed)
BP extremely elevated - Some dizziness and tremor but denies CP, SOB, Vision changes - check UDS as previously + for Cocaine; She denies any drug use (reports grandson mixed cocaine in her dishes) - Would like to start betablocker given HTN and tachycardia, but will hold off pending UDS - Start Norvasc 5mg  QD - Check CMET, CBC, TSH - f/u in 4 days

## 2015-02-18 NOTE — Progress Notes (Deleted)
  Patient name: ALOHA Carlson MRN [id number]  Date of birth: Jul 10, 1946  CC & HPI:  Kimberly Carlson is a 69 y.o. female presenting today for ***  ROS: See HPI  *** Medical & Surgical Hx:  Reviewed *** Medications & Allergies: Reviewed  Social History: Reviewed:   Objective Findings:  Vitals: BP 198/82 mmHg  Pulse 104  Temp(Src) 98.2 F (36.8 C) (Oral)  Ht 5\' 3"  (1.6 m)  Wt 195 lb 4 oz (88.565 kg)  BMI 34.60 kg/m2  Gen: NAD CV: RRR w/o m/r/g, pulses +2 b/l Resp: CTAB w/ normal respiratory effort ***  Assessment & Plan:   Please See Problem Focused Assessment & Plan

## 2015-02-18 NOTE — Assessment & Plan Note (Signed)
Discontinue Glipizide due to risk of hypoglycemia in elderly with insulin use - May need to increase insulin if BG inr - Check A1c

## 2015-02-18 NOTE — Assessment & Plan Note (Addendum)
Pertinent S&O  Intermittent tremor in jaw and b/l UE  Tremors worse with stress  Previous UDS + for cocaine  BP extremely elevated with mild tachycardia  NO CP, SOB, Neuro exam negative Assessment  Tremor likely benign as occurs intermittently and unchanged for > 2 years. Possibly essential tremor exacerbated by stress, HTN or possible Cocaine use (UDS + ~ 2 yrs ago) Plan  Check TSH, CMP, UDS  Treat HTN  F/u on 3/28 for reassessment after BP reduced

## 2015-02-18 NOTE — Progress Notes (Signed)
   Subjective:    Patient ID: Kimberly Carlson, female    DOB: 09-09-1946, 69 y.o.   MRN: [id number]  Seen for Same day visit for   CC: Tremors  Ms Kimberly Carlson initially complained of unresolved UTI, however when reports her symptoms are tremors of her jaw and arms, as well as elevated BP, dizziness, and vague achy pain in her arms b/l. She reports being diagnosed with UTI at urgent care on 3/8 and treated with Keflex. She endorses urinary freq that is unchanged from her usually urination; reports this only occurs during the day and admits to drinking a lot of water throughout the day.  - Initially she reported tremor started in last 3 months, but when questioned by tremor evaluated several years ago at [facility] - she said they were the same and unchanged.  - Also reports tremor worsen with stress - She denies drug use and reports Cocaine + UDS several years ago was 2/2 her grandson mixing Cocaine in her dishes - She has had elevated BP on several of her recent doctor's visit, but denies CP, SOB - Denies previous MI or CVA - Denies fevers, chills - Denies HA, blurry or double vision  Review of Systems   See HPI for ROS. Objective:  BP 198/82 mmHg  Pulse 104  Temp(Src) 98.2 F (36.8 C) (Oral)  Ht 5\' 3"  (1.6 m)  Wt 195 lb 4 oz (88.565 kg)  BMI 34.60 kg/m2  General: NAD Cardiac: RRR, normal heart sounds, no murmurs. 2+ radial and PT pulses bilaterally Respiratory: CTAB, normal effort Abdomen: soft, nontender, nondistended, no hepatic or splenomegaly. Bowel sounds present Extremities: no edema or cyanosis. WWP. Skin: warm and dry, no rashes noted Neuro: alert and oriented, no focal deficits; Mild tremor in jaw and b/l UE that resolves when she speaks; CN 2-12 intact;     Assessment & Plan:  See Problem List Documentation

## 2015-02-21 ENCOUNTER — Ambulatory Visit (INDEPENDENT_AMBULATORY_CARE_PROVIDER_SITE_OTHER): Payer: Medicare Other | Admitting: Family Medicine

## 2015-02-21 ENCOUNTER — Encounter: Payer: Self-pay | Admitting: Family Medicine

## 2015-02-21 VITALS — BP 172/62 | HR 91 | Temp 98.4°F | Ht 63.0 in | Wt 198.1 lb

## 2015-02-21 DIAGNOSIS — Z720 Tobacco use: Secondary | ICD-10-CM | POA: Diagnosis not present

## 2015-02-21 DIAGNOSIS — F17200 Nicotine dependence, unspecified, uncomplicated: Secondary | ICD-10-CM

## 2015-02-21 DIAGNOSIS — I10 Essential (primary) hypertension: Secondary | ICD-10-CM | POA: Diagnosis not present

## 2015-02-21 DIAGNOSIS — IMO0002 Reserved for concepts with insufficient information to code with codable children: Secondary | ICD-10-CM

## 2015-02-21 DIAGNOSIS — E1165 Type 2 diabetes mellitus with hyperglycemia: Secondary | ICD-10-CM

## 2015-02-21 MED ORDER — CARVEDILOL 3.125 MG PO TABS: 3.1250 mg | ORAL_TABLET | Freq: Two times a day (BID) | ORAL | Status: DC

## 2015-02-21 MED ORDER — INSULIN GLARGINE 100 UNIT/ML ~~LOC~~ SOLN: 25.0000 [IU] | Freq: Every day | SUBCUTANEOUS | Status: DC

## 2015-02-21 MED ORDER — ONETOUCH LANCETS MISC
1.0000 | Status: DC
Start: 2015-02-21 — End: 2021-12-27

## 2015-02-21 NOTE — Assessment & Plan Note (Signed)
BP still elevated after starting Norvasc 5mg  qd last week. UDS was negative for Cocaine (I'm very skeptical that she ever used cocaine) - No red flags: No chest pain shortness of breath, or CVA symptoms - Start Coreg 3.125mg  BID; as this may help with her tremors  - Schedule apt with Dr Valentina Lucks for ambulatory blood pressure monitoring - f/u next week for HTN. Consider stopping norvasc if BP controlled with Coreg - IF BP remains elevated with ambulatory blood pressure monitoring on Coreg consider evaluation for secondary hypertension

## 2015-02-21 NOTE — Progress Notes (Signed)
  Patient name: Kimberly Carlson MRN [id number]  Date of birth: Aug 02, 1946  CC & HPI:  Kimberly Carlson is a 69 y.o. female presenting today for HTN. She reports starting amlodipine without difficulty. She has been checking her blood pressure at home, and reports blood pressure well-controlled with sitting (~ 130s/80), but high blood pressure when ambulating (180-190s). She continues to report feeling lightheaded, but denies any chest pain, shortness of breath, speech or vision problems.   ROS: See HPI   Medications & Allergies: Reviewed  Social History: Reviewed:   Objective Findings:  Vitals: BP 172/62 mmHg  Pulse 91  Temp(Src) 98.4 F (36.9 C) (Oral)  Ht 5\' 3"  (1.6 m)  Wt 198 lb 2 oz (89.869 kg)  BMI 35.11 kg/m2  Gen: NAD CV: RRR w/o m/r/g, pulses +2 b/l Resp: CTAB w/ normal respiratory effort GI: No abdominal bruits Lower Ext: No skin changes; No edema; distal pulses intact; Calves nontender   Assessment & Plan:   Please See Problem Focused Assessment & Plan

## 2015-02-21 NOTE — Assessment & Plan Note (Signed)
-   Advise checking blood sugars first in the morning prior to breakfast. She was checking randomly throughout the day.  - Advised taking Lantus 25 units after checking blood sugar prior to eating breakfast. She was skipping Lantus on days when her blood sugar was 80 - 90s. Discussed this with the reason we stopped glipizide last visit. She should take Lantus regularly, but call if she has blood sugars less than 70.

## 2015-02-21 NOTE — Patient Instructions (Signed)
It was great seeing you today.   1. Start Coreg 1 pill twice a day.  2. Continue taking Norvasc and Enalapril/HCTZ every day.  3. Check your blood sugar first thing every morning before breakfast and take lantus afterwards before eating   Please bring all your medications to every doctors visit  Sign up for My Chart to have easy access to your labs results, and communication with your Primary care physician.  Next Appointment  Please make an appointment with Dr Valentina Lucks for ambulatory blood pressure monitoring  Appointment with Dr Berkley Harvey 03/02/15 @ 2pm   I look forward to talking with you again at our next visit. If you have any questions or concerns before then, please call the clinic at [phone].  Take Care,   Dr Phill Myron

## 2015-02-21 NOTE — Assessment & Plan Note (Signed)
Reports quitting smoking this past weekend - Congratulated her on quitting, hopefully this will help improve her HTN

## 2015-03-02 ENCOUNTER — Encounter: Payer: Self-pay | Admitting: Family Medicine

## 2015-03-02 ENCOUNTER — Ambulatory Visit (INDEPENDENT_AMBULATORY_CARE_PROVIDER_SITE_OTHER): Payer: Medicare Other | Admitting: Family Medicine

## 2015-03-02 VITALS — BP 138/64 | HR 78 | Temp 98.8°F | Ht 63.0 in | Wt 195.0 lb

## 2015-03-02 DIAGNOSIS — I10 Essential (primary) hypertension: Secondary | ICD-10-CM

## 2015-03-02 NOTE — Assessment & Plan Note (Addendum)
BP under much better control today ince starting Coreg (also improved tremors) - Keep appointment with Dr. Valentina Lucks for ambulatory  blood pressure monitoring - Follow-up with PCP for hypertension and diabetes in 2 months, unless advised sooner by Dr Valentina Lucks  - Recommend repeating be met to assess creatinine at PCP visit - If ambulatory blood pressure monitoring good; could consider DC'd Amlodipine and titrating Coreg to limit medication

## 2015-03-02 NOTE — Patient Instructions (Signed)
It was great seeing you today.   1. Keep your appointment with Dr Valentina Lucks for blood pressure monitoring. He will tell you when to follow up with your primary care doctor  Next Appointment  Please make an appointment with Dr Gerlean Ren in 2 months   If you have any questions or concerns before then, please call the clinic at [phone].  Take Care,   Dr Phill Myron

## 2015-03-02 NOTE — Progress Notes (Signed)
   Subjective:    Patient ID: Kimberly Carlson, female    DOB: 22-Apr-1946, 69 y.o.   MRN: [id number]  Seen for Same day visit for   CC: Follow-up for hypertension  She continues to report some general fatigue, but has begun feeling better.  She continues to deny any chest pain, shortness of breath, headaches, vision changes, palpitations.  She reports some improvement in her tremor since starting Coreg.   Review of Systems   See HPI for ROS. Objective:  BP 138/64 mmHg  Pulse 78  Temp(Src) 98.8 F (37.1 C) (Oral)  Ht 5\' 3"  (1.6 m)  Wt 195 lb (88.451 kg)  BMI 34.55 kg/m2  SpO2 98%  General: NAD Cardiac: RRR, normal heart sounds, no murmurs. Respiratory: CTAB, normal effort    Assessment & Plan:  See Problem List Documentation

## 2015-03-07 ENCOUNTER — Ambulatory Visit (INDEPENDENT_AMBULATORY_CARE_PROVIDER_SITE_OTHER): Payer: Medicare Other | Admitting: Pharmacist

## 2015-03-07 ENCOUNTER — Other Ambulatory Visit: Payer: Self-pay | Admitting: *Deleted

## 2015-03-07 VITALS — BP 148/70 | HR 84 | Ht 62.6 in | Wt 196.0 lb

## 2015-03-07 DIAGNOSIS — I10 Essential (primary) hypertension: Secondary | ICD-10-CM | POA: Diagnosis not present

## 2015-03-07 DIAGNOSIS — E7800 Pure hypercholesterolemia, unspecified: Secondary | ICD-10-CM

## 2015-03-07 MED ORDER — "INSULIN SYRINGE 31G X 5/16"" 1 ML MISC": 1.0000 | Status: DC

## 2015-03-07 NOTE — Progress Notes (Signed)
S:    Patient arrives in good spirits today ambulating by self unaccompanied.    She presents to the clinic for ambulatory blood pressure evaluation.  Diagnosed with long standing history of hypertension since 2012.    Medication compliance is reported to be good. Patient does not report any s/sx of orthostatic hypotension when getting up from chair or from bed.  Discussed procedure for wearing the monitor and gave patient written instructions. Monitor was placed on non-dominant arm (Left) with instructions to return on 03/08/2015.  Current BP Medications include: amlodipine (Norvasc) 5mg  daily, carvedilol (Coreg) 3.125mg  BID, enalapril/HCTZ (Vaseretic) 10/25mg  daily.  Patient returned to the clinic and reported no issues/problems with bp monitor.   O:   Last 3 Office BP readings: BP Readings from Last 3 Encounters:  03/07/15 148/70  03/02/15 138/64  02/21/15 172/62   Today's Office BP reading: 144/54 mmHg (automatic reading) & 148/70 (manual reading) Patients home readings are ~160s/60s  ABPM Study Data: Arm Placement left arm   Overall Mean 24hr BP:   148/62 mmHg HR: 79   Daytime Mean BP:  157/67 mmHg HR: 81   Nighttime Mean BP:  119/50 mmHg HR: 70   Dipping Pattern: Yes.    Sys:   24.4%   Dia: 25.2%   [normal dipping ~10-20%]   Non-hypertensive ABPM thresholds: daytime BP <135/85 mmHg, sleeptime BP <120/70 mmHg NICE Hypertension Guidelines ([place]) using ABPM: Stage I: >135/85 mmHg, Stage 2: >150/95 mmHg)   BMET    Component Value Date/Time   NA 140 02/17/2015 1507   K 4.0 02/17/2015 1507   CL 103 02/17/2015 1507   CO2 24 02/17/2015 1507   GLUCOSE 109* 02/17/2015 1507   BUN 34* 02/17/2015 1507   CREATININE 1.20* 02/17/2015 1507   CREATININE 1.30* 02/01/2015 1749   CALCIUM 9.3 02/17/2015 1507   GFRNONAA 52* 07/04/2014 2000   GFRAA 60* 07/04/2014 2000    A/P: History of hypertension since 2012 found to have isolated systolic hypertension given 24-hour ambulatory  blood pressure.   Overall average blood pressure of 148/62 mmHg, and a nocturnal dipping pattern that is normal and concerning for being a cause of dizziness and risk for falls during the night. Changes to medications: Stopped enalapril/HCTZ combination.  Continued enalapril 10mg  daily. New prescription provided.   Reassess blood pressure at next visit and consider adjustment and decreasing doses until patient tolerability improves.   Suggest Systolic BP goal in office of < 160 for this patient given significant dipping effect.  Results reviewed and written information provided.     Patient with past intolerance of fatigue on simvastatin.   Given drug interaction with simvastatin and amlodipine, discontinued simvastatin and initiated atorvastatin 40mg  once daily.   F/U Clinic Visit with Dr. Valentina Lucks on 03/08/2015.  Total time in face-to-face counseling 35 minutes.  Patient seen with Eunice Blase, Pharm.D. Candidate

## 2015-03-07 NOTE — Patient Instructions (Addendum)
Stop enalapril/HCTZ  Start enalapril 10 mg every evening.   Follow up in 2 months with Dr. Gerlean Ren

## 2015-03-08 MED ORDER — ENALAPRIL MALEATE 10 MG PO TABS: 10.0000 mg | ORAL_TABLET | Freq: Every evening | ORAL | Status: DC

## 2015-03-10 MED ORDER — ATORVASTATIN CALCIUM 40 MG PO TABS: 40.0000 mg | ORAL_TABLET | Freq: Every day | ORAL | Status: DC

## 2015-03-10 NOTE — Assessment & Plan Note (Signed)
History of hypertension since 2012 found to have isolated systolic hypertension given 24-hour ambulatory blood pressure.   Overall average blood pressure of 148/62 mmHg, and a nocturnal dipping pattern that is normal and concerning for being a cause of dizziness and risk for falls during the night. Changes to medications: Stopped enalapril/HCTZ combination.  Continued enalapril 10mg  daily. New prescription provided.   Reassess blood pressure at next visit and consider adjustment and decreasing doses until patient tolerability improves.   Suggest Systolic BP goal in office of < 160 for this patient given significant dipping effect.  Results reviewed and written information provided.

## 2015-03-10 NOTE — Assessment & Plan Note (Signed)
Patient with past intolerance of fatigue on simvastatin.   Given drug interaction with simvastatin and amlodipine, discontinued simvastatin and initiated atorvastatin 40mg  once daily.

## 2015-03-11 ENCOUNTER — Encounter: Payer: Self-pay | Admitting: Pharmacist

## 2015-03-11 NOTE — Progress Notes (Signed)
Patient ID: Kimberly Carlson, female   DOB: 07/27/1946, 69 y.o.   MRN: [id number] Reviewed: Agree with Dr. Graylin Shiver documentation and management.

## 2015-05-30 ENCOUNTER — Other Ambulatory Visit: Payer: Self-pay | Admitting: Family Medicine

## 2015-06-06 ENCOUNTER — Emergency Department (INDEPENDENT_AMBULATORY_CARE_PROVIDER_SITE_OTHER)
Admission: EM | Admit: 2015-06-06 | Discharge: 2015-06-06 | Disposition: A | Discharge status: Nursing Home (Includes ICF) | Payer: Medicare Other | Source: Home / Self Care | Attending: Family Medicine | Admitting: Family Medicine

## 2015-06-06 ENCOUNTER — Emergency Department (HOSPITAL_COMMUNITY): Payer: Medicare Other

## 2015-06-06 ENCOUNTER — Encounter (HOSPITAL_COMMUNITY): Payer: Self-pay | Admitting: Emergency Medicine

## 2015-06-06 ENCOUNTER — Emergency Department (HOSPITAL_COMMUNITY)
Admission: EM | Admit: 2015-06-06 | Discharge: 2015-06-06 | Disposition: A | Discharge status: Home / Self Care | Payer: Medicare Other | Source: Home / Self Care | Attending: Emergency Medicine | Admitting: Emergency Medicine

## 2015-06-06 ENCOUNTER — Encounter (HOSPITAL_COMMUNITY): Payer: Self-pay | Admitting: *Deleted

## 2015-06-06 DIAGNOSIS — N183 Chronic kidney disease, stage 3 (moderate): Secondary | ICD-10-CM | POA: Diagnosis present

## 2015-06-06 DIAGNOSIS — I10 Essential (primary) hypertension: Secondary | ICD-10-CM

## 2015-06-06 DIAGNOSIS — D72829 Elevated white blood cell count, unspecified: Secondary | ICD-10-CM | POA: Diagnosis present

## 2015-06-06 DIAGNOSIS — R519 Headache, unspecified: Secondary | ICD-10-CM

## 2015-06-06 DIAGNOSIS — H539 Unspecified visual disturbance: Secondary | ICD-10-CM | POA: Insufficient documentation

## 2015-06-06 DIAGNOSIS — R030 Elevated blood-pressure reading, without diagnosis of hypertension: Secondary | ICD-10-CM | POA: Diagnosis not present

## 2015-06-06 DIAGNOSIS — E119 Type 2 diabetes mellitus without complications: Secondary | ICD-10-CM

## 2015-06-06 DIAGNOSIS — R51 Headache: Principal | ICD-10-CM

## 2015-06-06 DIAGNOSIS — F17210 Nicotine dependence, cigarettes, uncomplicated: Secondary | ICD-10-CM | POA: Diagnosis present

## 2015-06-06 DIAGNOSIS — E1122 Type 2 diabetes mellitus with diabetic chronic kidney disease: Secondary | ICD-10-CM | POA: Diagnosis not present

## 2015-06-06 DIAGNOSIS — I129 Hypertensive chronic kidney disease with stage 1 through stage 4 chronic kidney disease, or unspecified chronic kidney disease: Secondary | ICD-10-CM | POA: Diagnosis not present

## 2015-06-06 DIAGNOSIS — R319 Hematuria, unspecified: Secondary | ICD-10-CM | POA: Diagnosis present

## 2015-06-06 DIAGNOSIS — Z794 Long term (current) use of insulin: Secondary | ICD-10-CM

## 2015-06-06 DIAGNOSIS — Z7982 Long term (current) use of aspirin: Secondary | ICD-10-CM

## 2015-06-06 DIAGNOSIS — M542 Cervicalgia: Secondary | ICD-10-CM

## 2015-06-06 DIAGNOSIS — Z720 Tobacco use: Secondary | ICD-10-CM

## 2015-06-06 DIAGNOSIS — Z888 Allergy status to other drugs, medicaments and biological substances status: Secondary | ICD-10-CM

## 2015-06-06 DIAGNOSIS — M1990 Unspecified osteoarthritis, unspecified site: Secondary | ICD-10-CM | POA: Diagnosis present

## 2015-06-06 DIAGNOSIS — G8929 Other chronic pain: Secondary | ICD-10-CM | POA: Diagnosis present

## 2015-06-06 DIAGNOSIS — I776 Arteritis, unspecified: Secondary | ICD-10-CM | POA: Diagnosis present

## 2015-06-06 DIAGNOSIS — M5126 Other intervertebral disc displacement, lumbar region: Secondary | ICD-10-CM | POA: Diagnosis present

## 2015-06-06 DIAGNOSIS — E780 Pure hypercholesterolemia: Secondary | ICD-10-CM | POA: Diagnosis present

## 2015-06-06 DIAGNOSIS — Z79899 Other long term (current) drug therapy: Secondary | ICD-10-CM

## 2015-06-06 DIAGNOSIS — Z90710 Acquired absence of both cervix and uterus: Secondary | ICD-10-CM

## 2015-06-06 DIAGNOSIS — H532 Diplopia: Secondary | ICD-10-CM | POA: Diagnosis present

## 2015-06-06 DIAGNOSIS — Z9114 Patient's other noncompliance with medication regimen: Secondary | ICD-10-CM | POA: Diagnosis present

## 2015-06-06 LAB — CBC
HCT: 32.3 % — ABNORMAL LOW (ref 36.0–46.0)
Hemoglobin: 10.6 g/dL — ABNORMAL LOW (ref 12.0–15.0)
MCH: 29.1 pg (ref 26.0–34.0)
MCHC: 32.8 g/dL (ref 30.0–36.0)
MCV: 88.7 fL (ref 78.0–100.0)
Platelets: 205 10*3/uL (ref 150–400)
RBC: 3.64 MIL/uL — ABNORMAL LOW (ref 3.87–5.11)
RDW: 12.6 % (ref 11.5–15.5)
WBC: 11.6 10*3/uL — ABNORMAL HIGH (ref 4.0–10.5)

## 2015-06-06 LAB — BASIC METABOLIC PANEL
Anion gap: 7 (ref 5–15)
BUN: 21 mg/dL — ABNORMAL HIGH (ref 6–20)
CO2: 28 mmol/L (ref 22–32)
Calcium: 9 mg/dL (ref 8.9–10.3)
Chloride: 106 mmol/L (ref 101–111)
Creatinine, Ser: 1.14 mg/dL — ABNORMAL HIGH (ref 0.44–1.00)
GFR calc Af Amer: 55 mL/min — ABNORMAL LOW (ref >60)
GFR calc non Af Amer: 47 mL/min — ABNORMAL LOW (ref >60)
Glucose, Bld: 145 mg/dL — ABNORMAL HIGH (ref 65–99)
Potassium: 3.9 mmol/L (ref 3.5–5.1)
Sodium: 141 mmol/L (ref 135–145)

## 2015-06-06 LAB — POCT URINALYSIS DIP (DEVICE)
Bilirubin Urine: NEGATIVE
Glucose, UA: NEGATIVE mg/dL
Ketones, ur: NEGATIVE mg/dL
Nitrite: NEGATIVE
Protein, ur: >=300 mg/dL — AB
Specific Gravity, Urine: >=1.03 (ref 1.005–1.030)
Urobilinogen, UA: 0.2 mg/dL (ref 0.0–1.0)
pH: 6.5 (ref 5.0–8.0)

## 2015-06-06 MED ORDER — AMLODIPINE BESYLATE 5 MG PO TABS: 5.0000 mg | ORAL_TABLET | Freq: Every day | ORAL | Status: DC

## 2015-06-06 MED ORDER — OXYCODONE-ACETAMINOPHEN 5-325 MG PO TABS
1.0000 | ORAL_TABLET | Freq: Once | ORAL | Status: AC
Start: 2015-06-06 — End: 2015-06-06
  Administered 2015-06-06: 1 via ORAL

## 2015-06-06 MED ORDER — METOCLOPRAMIDE HCL 5 MG/ML IJ SOLN
10.0000 mg | Freq: Once | INTRAMUSCULAR | Status: AC
  Administered 2015-06-06: 10 mg via INTRAVENOUS
  Filled 2015-06-06: qty 2

## 2015-06-06 MED ORDER — DIPHENHYDRAMINE HCL 50 MG/ML IJ SOLN
12.5000 mg | Freq: Once | INTRAMUSCULAR | Status: AC
  Administered 2015-06-06: 12.5 mg via INTRAVENOUS
  Filled 2015-06-06: qty 1

## 2015-06-06 MED ORDER — OXYCODONE-ACETAMINOPHEN 5-325 MG PO TABS
ORAL_TABLET | ORAL | Status: AC
  Filled 2015-06-06: qty 1

## 2015-06-06 NOTE — ED Provider Notes (Signed)
CSN: [id number]     Arrival date & time 06/06/15  1517 History   First MD Initiated Contact with Patient 06/06/15 1709     Chief Complaint  Patient presents with  . Headache  . Hypertension  . Urinary Frequency   (Consider location/radiation/quality/duration/timing/severity/associated sxs/prior Treatment) Patient is a 69 y.o. female presenting with headaches. The history is provided by the patient.  Headache Pain location:  L parietal and L temporal Quality:  Stabbing Radiates to:  L neck Onset quality:  Gradual Duration:  4 weeks Timing:  Constant Progression:  Worsening (has not taken amlodioine for 53mo. severe hbp diagnosed this am at home, ,here for care., headache unrelenting) Chronicity:  New Similar to prior headaches: no   Relieved by:  None tried Worsened by:  Nothing Ineffective treatments:  None tried Associated symptoms: eye pain   Associated symptoms: no blurred vision, no dizziness, no fever, no loss of balance, no numbness and no weakness     Past Medical History  Diagnosis Date  . Hypertension   . Diabetes mellitus without complication   . Back pain     herniated disc lower back   Past Surgical History  Procedure Laterality Date  . Abdominal hysterectomy     Family History  Problem Relation Age of Onset  . Adopted: Yes   History  Substance Use Topics  . Smoking status: Current Every Day Smoker -- 1.00 packs/day    Types: Cigarettes    Start date: 04/26/2014  . Smokeless tobacco: Never Used     Comment: Started in her 89'swith multiple quit attempts for about 1 year at a time.  . Alcohol Use: No   OB History    No data available     Review of Systems  Constitutional: Negative.  Negative for fever.  Eyes: Positive for pain. Negative for blurred vision.  Respiratory: Negative for chest tightness.   Cardiovascular: Negative for chest pain.  Neurological: Positive for headaches. Negative for dizziness, speech difficulty, weakness, numbness and  loss of balance.    Allergies  Metformin  Home Medications   Prior to Admission medications   Medication Sig Start Date End Date Taking? Authorizing Provider  aspirin (ASPIRIN CHILDRENS) 81 MG chewable tablet Chew 81 mg by mouth daily.     Yes Historical Provider, MD  atorvastatin (LIPITOR) 40 MG tablet Take 1 tablet (40 mg total) by mouth daily. 03/10/15  Yes WZenia Resides MD  Blood Glucose Monitoring Suppl (ONE TOUCH ULTRA SYSTEM KIT) W/DEVICE KIT 1 kit by Does not apply route See admin instructions. Use to check blood sugar once daily.   Yes Historical Provider, MD  carvedilol (COREG) 3.125 MG tablet Take 1 tablet (3.125 mg total) by mouth 2 (two) times daily with a meal. 02/21/15  Yes JOlam Idler MD  enalapril (VASOTEC) 10 MG tablet Take 1 tablet (10 mg total) by mouth every evening. 03/08/15  Yes WZenia Resides MD  glucose blood (ONE TOUCH TEST STRIPS) test strip 1 each by Other route See admin instructions. Check blood sugar once daily.   Yes Historical Provider, MD  ibuprofen (ADVIL,MOTRIN) 200 MG tablet Take 400 mg by mouth every 6 (six) hours as needed for headache or moderate pain.    Yes Historical Provider, MD  Insulin Syringe-Needle U-100 (INSULIN SYRINGE 1CC/31GX5/16") 31G X 5/16" 1 ML MISC 1 each by Does not apply route See admin instructions. Use daily with insulin. 03/07/15  Yes Iaeger N Rumley, DO  LANTUS 100 UNIT/ML  injection INJECT 25 UNITS DAILY 05/31/15  Yes Amoret N Rumley, DO  ONE TOUCH LANCETS MISC 1 each by Does not apply route See admin instructions. Check blood sugar once daily. 02/21/15  Yes Olam Idler, MD  amLODipine (NORVASC) 5 MG tablet Take 1 tablet (5 mg total) by mouth daily. 02/17/15   Olam Idler, MD   BP 224/83 mmHg  Pulse 99  Temp(Src) 99.4 F (37.4 C) (Oral)  Resp 18  SpO2 95% Physical Exam  Constitutional: She is oriented to person, place, and time. She appears well-developed and well-nourished. No distress.  HENT:  Head:  Normocephalic.  Right Ear: External ear normal.  Left Ear: External ear normal.  Mouth/Throat: Oropharynx is clear and moist.  Eyes: Conjunctivae and EOM are normal. Pupils are equal, round, and reactive to light.  Neck: Normal range of motion. Neck supple. No thyromegaly present.  Cardiovascular: Regular rhythm, normal heart sounds and intact distal pulses.   Pulmonary/Chest: Effort normal and breath sounds normal. She has no rales.  Musculoskeletal: Normal range of motion. She exhibits no edema.  Neurological: She is alert and oriented to person, place, and time. No cranial nerve deficit.  Skin: Skin is warm and dry.  Nursing note and vitals reviewed.   ED Course  Procedures (including critical care time) Labs Review Labs Reviewed  POCT URINALYSIS DIP (DEVICE) - Abnormal; Notable for the following:    Hgb urine dipstick MODERATE (*)    Protein, ur >=300 (*)    Leukocytes, UA TRACE (*)    All other components within normal limits    Imaging Review No results found.   MDM   1. Essential hypertension, malignant        Billy Fischer, MD 06/06/15 1737

## 2015-06-06 NOTE — ED Notes (Signed)
Pt coming from UC with c/o headache associated with high blood pressure. Pt is compliant with her blood pressure medicine other than amlodipine because she didn't like the list of side effects. Pt reports last dose of amlodipine was three weeks ago.

## 2015-06-06 NOTE — ED Notes (Addendum)
Pt has been suffering from a headache for about one month.  She admits to not taking one of her BP meds for the last three weeks because she had read about it and didn't like the side effects that were listed.  Pt has also had some LE swelling.  Pt has also reported frequent urination.

## 2015-06-06 NOTE — Discharge Instructions (Signed)
Please read and follow all provided instructions.  Your diagnoses today include:  1. HA (headache)   2. Essential hypertension     Tests performed today include:  CT of your head which was normal and did not show any serious cause of your headache  Vital signs. See below for your results today.   Medications:  In the Emergency Department you received:  Reglan - antinausea/headache medication  Benadryl - antihistamine to counteract potential side effects of reglan  Take any prescribed medications only as directed.  Additional information:  Follow any educational materials contained in this packet.  You are having a headache. No specific cause was found today for your headache. It may have been a migraine or other cause of headache. Stress, anxiety, fatigue, and depression are common triggers for headaches.   Your headache today does not appear to be life-threatening or require hospitalization, but often the exact cause of headaches is not determined in the emergency department. Therefore, follow-up with your doctor is very important to find out what may have caused your headache and whether or not you need any further diagnostic testing or treatment.   Sometimes headaches can appear benign (not harmful), but then more serious symptoms can develop which should prompt an immediate re-evaluation by your doctor or the emergency department.  BE VERY CAREFUL not to take multiple medicines containing Tylenol (also called acetaminophen). Doing so can lead to an overdose which can damage your liver and cause liver failure and possibly death.   Follow-up instructions: Please follow-up with your primary care provider in the next 3 days for further evaluation of your symptoms.   Return instructions:   Please return to the Emergency Department if you experience worsening symptoms.  Return if the medications do not resolve your headache, if it recurs, or if you have multiple episodes of  vomiting or cannot keep down fluids.  Return if you have a change from the usual headache.  RETURN IMMEDIATELY IF you:  Develop a sudden, severe headache  Develop confusion or become poorly responsive or faint  Develop a fever above 100.34F or problem breathing  Have a change in speech, vision, swallowing, or understanding  Develop new weakness, numbness, tingling, incoordination in your arms or legs  Have a seizure  Please return if you have any other emergent concerns.  Additional Information:  Your vital signs today were: BP 159/55 mmHg   Pulse 67   Temp(Src) 98.5 F (36.9 C) (Oral)   Resp 13   Ht 5\' 3"  (1.6 m)   Wt 196 lb (88.905 kg)   BMI 34.73 kg/m2   SpO2 98% If your blood pressure (BP) was elevated above 135/85 this visit, please have this repeated by your doctor within one month. --------------

## 2015-06-06 NOTE — ED Provider Notes (Signed)
No BP meds in 3 weeks - presents with HA and Htn from UC On exam has normal speech, coordination, and strength, CN 3-12 normal abd soft, lungs and heart normal BP improved with meds, as has headache - stable for d/c.   EKG Interpretation  Date/Time:  Monday June 06 2015 19:44:22 EDT Ventricular Rate:  69 PR Interval:  170 QRS Duration: 89 QT Interval:  409 QTC Calculation: 438 R Axis:   63 Text Interpretation:  Sinus rhythm Abnormal R-wave progression, early transition ECG OTHERWISE WITHIN NORMAL LIMITS since last tracing no significant change Confirmed by Leigh Blas  MD, Duward Allbritton ([id number]) on 06/06/2015 8:22:05 PM      Medical screening examination/treatment/procedure(s) were conducted as a shared visit with non-physician practitioner(s) and myself.  I personally evaluated the patient during the encounter.  Clinical Impression:   Final diagnoses:  HA (headache)  Essential hypertension         Noemi Chapel, MD 06/07/15 [phone]

## 2015-06-06 NOTE — ED Notes (Signed)
Pt being transferred to the ED via shuttle for further evaluation for HBP.  Report was called to the First RN, ED Caryl Pina.  It was stated that the patient needed to be seen ASAP.

## 2015-06-06 NOTE — ED Provider Notes (Signed)
CSN: [id number]     Arrival date & time 06/06/15  1744 History   First MD Initiated Contact with Patient 06/06/15 1943     Chief Complaint  Patient presents with  . Hypertension  . Headache     (Consider location/radiation/quality/duration/timing/severity/associated sxs/prior Treatment) HPI Comments: Patients with history of diabetes, high blood pressure presents from [facility] urgent care complaint of high blood pressure and headache. Patient reports having a frontal headache over the past 4 weeks. This headache occurs every day. It was initially relieved with ibuprofen. Over the past 2 days the headache is been unrelenting despite treatment. Patient checked her blood pressure today was very high. She reports being compliant with her blood pressure medication except for amlodipine which she stopped taking several weeks ago because afraid of the side effects. Headache has radiated into her right neck and right shoulder for the past 2 weeks. Patient has noted some blurry vision but no vision loss with a headache. Patient denies signs of stroke including: facial droop, slurred speech, aphasia, weakness/numbness in extremities, imbalance/trouble walking. No chest pain or shortness of breath. No urinary changes. UA at urgent care demonstrated moderate blood and >300 protein.    Patient is a 69 y.o. female presenting with hypertension and headaches. The history is provided by the patient and medical records.  Hypertension Associated symptoms include headaches and neck pain. Pertinent negatives include no chest pain, congestion, fever, nausea, numbness, rash, vomiting or weakness.  Headache Associated symptoms: neck pain   Associated symptoms: no congestion, no fever, no nausea, no neck stiffness, no numbness, no photophobia, no sinus pressure, no vomiting and no weakness     Past Medical History  Diagnosis Date  . Hypertension   . Diabetes mellitus without complication   . Back pain    herniated disc lower back   Past Surgical History  Procedure Laterality Date  . Abdominal hysterectomy     Family History  Problem Relation Age of Onset  . Adopted: Yes   History  Substance Use Topics  . Smoking status: Current Every Day Smoker -- 1.00 packs/day    Types: Cigarettes    Start date: 04/26/2014  . Smokeless tobacco: Never Used     Comment: Started in her 35's with multiple quit attempts for about 1 year at a time.  . Alcohol Use: No   OB History    No data available     Review of Systems  Constitutional: Negative for fever.  HENT: Negative for congestion, dental problem, rhinorrhea and sinus pressure.   Eyes: Positive for visual disturbance. Negative for photophobia, discharge and redness.  Respiratory: Negative for shortness of breath.   Cardiovascular: Negative for chest pain.  Gastrointestinal: Negative for nausea and vomiting.  Musculoskeletal: Positive for neck pain. Negative for gait problem and neck stiffness.  Skin: Negative for rash.  Neurological: Positive for headaches. Negative for syncope, speech difficulty, weakness, light-headedness and numbness.  Psychiatric/Behavioral: Negative for confusion.      Allergies  Metformin  Home Medications   Prior to Admission medications   Medication Sig Start Date End Date Taking? Authorizing Provider  amLODipine (NORVASC) 5 MG tablet Take 1 tablet (5 mg total) by mouth daily. 02/17/15   Olam Idler, MD  aspirin (ASPIRIN CHILDRENS) 81 MG chewable tablet Chew 81 mg by mouth daily.      Historical Provider, MD  atorvastatin (LIPITOR) 40 MG tablet Take 1 tablet (40 mg total) by mouth daily. 03/10/15   Zenia Resides, MD  Blood Glucose Monitoring Suppl (ONE TOUCH ULTRA SYSTEM KIT) W/DEVICE KIT 1 kit by Does not apply route See admin instructions. Use to check blood sugar once daily.    Historical Provider, MD  carvedilol (COREG) 3.125 MG tablet Take 1 tablet (3.125 mg total) by mouth 2 (two) times daily  with a meal. 02/21/15   Olam Idler, MD  enalapril (VASOTEC) 10 MG tablet Take 1 tablet (10 mg total) by mouth every evening. 03/08/15   Zenia Resides, MD  glucose blood (ONE TOUCH TEST STRIPS) test strip 1 each by Other route See admin instructions. Check blood sugar once daily.    Historical Provider, MD  ibuprofen (ADVIL,MOTRIN) 200 MG tablet Take 400 mg by mouth every 6 (six) hours as needed for headache or moderate pain.     Historical Provider, MD  Insulin Syringe-Needle U-100 (INSULIN SYRINGE 1CC/31GX5/16") 31G X 5/16" 1 ML MISC 1 each by Does not apply route See admin instructions. Use daily with insulin. 03/07/15   Dunlevy N Rumley, DO  LANTUS 100 UNIT/ML injection INJECT 25 UNITS DAILY 05/31/15   [place] N Rumley, DO  ONE TOUCH LANCETS MISC 1 each by Does not apply route See admin instructions. Check blood sugar once daily. 02/21/15   Olam Idler, MD   BP 173/72 mmHg  Pulse 77  Temp(Src) 98.5 F (36.9 C) (Oral)  Resp 13  Ht '5\' 3"'  (1.6 m)  Wt 196 lb (88.905 kg)  BMI 34.73 kg/m2  SpO2 99% Physical Exam  Constitutional: She is oriented to person, place, and time. She appears well-developed and well-nourished.  HENT:  Head: Normocephalic and atraumatic. Head is without raccoon's eyes and without Battle's sign.  Right Ear: Tympanic membrane, external ear and ear canal normal. No hemotympanum.  Left Ear: Tympanic membrane, external ear and ear canal normal. No hemotympanum.  Nose: Nose normal. No nasal septal hematoma.  Mouth/Throat: Uvula is midline, oropharynx is clear and moist and mucous membranes are normal.  No temporal tenderness.  Eyes: Conjunctivae, EOM and lids are normal. Pupils are equal, round, and reactive to light. Right eye exhibits no discharge. Left eye exhibits no discharge. Right eye exhibits no nystagmus. Left eye exhibits no nystagmus.  No visible hyphema noted  Neck: Normal range of motion. Neck supple.  Cardiovascular: Normal rate, regular rhythm and normal  heart sounds.   Pulmonary/Chest: Effort normal and breath sounds normal.  Abdominal: Soft. There is no tenderness.  Musculoskeletal:       Cervical back: She exhibits normal range of motion, no tenderness and no bony tenderness.       Thoracic back: She exhibits no tenderness and no bony tenderness.       Lumbar back: She exhibits no tenderness and no bony tenderness.  Neurological: She is alert and oriented to person, place, and time. She has normal strength and normal reflexes. A sensory deficit (sometimes numbness in bilateral hands but this is chronic) is present. No cranial nerve deficit. She displays a negative Romberg sign. Coordination and gait normal. GCS eye subscore is 4. GCS verbal subscore is 5. GCS motor subscore is 6.  Skin: Skin is warm and dry.  Psychiatric: She has a normal mood and affect.  Nursing note and vitals reviewed.   ED Course  Procedures (including critical care time) Labs Review Labs Reviewed  CBC - Abnormal; Notable for the following:    WBC 11.6 (*)    RBC 3.64 (*)    Hemoglobin 10.6 (*)  HCT 32.3 (*)    All other components within normal limits  BASIC METABOLIC PANEL - Abnormal; Notable for the following:    Glucose, Bld 145 (*)    BUN 21 (*)    Creatinine, Ser 1.14 (*)    GFR calc non Af Amer 47 (*)    GFR calc Af Amer 55 (*)    All other components within normal limits    Imaging Review Ct Head Wo Contrast  06/06/2015   CLINICAL DATA:  69 year old female with severe headache for 1 day with nausea and vomiting.  EXAM: CT HEAD WITHOUT CONTRAST  TECHNIQUE: Contiguous axial images were obtained from the base of the skull through the vertex without intravenous contrast.  COMPARISON:  No priors.  FINDINGS: Mild physiologic calcifications of the left basal ganglia. No acute intracranial abnormalities. Specifically, no evidence of acute intracranial hemorrhage, no definite findings of acute/subacute cerebral ischemia, no mass, mass effect, hydrocephalus  or abnormal intra or extra-axial fluid collections. Visualized paranasal sinuses and mastoids are well pneumatized. No acute displaced skull fractures are identified.  IMPRESSION: 1. No acute intracranial abnormalities.   Electronically Signed   By: Vinnie Langton M.D.   On: 06/06/2015 21:40     EKG Interpretation   Date/Time:  Monday June 06 2015 19:44:22 EDT Ventricular Rate:  69 PR Interval:  170 QRS Duration: 89 QT Interval:  409 QTC Calculation: 438 R Axis:   63 Text Interpretation:  Sinus rhythm Abnormal R-wave progression, early  transition ECG OTHERWISE WITHIN NORMAL LIMITS since last tracing no  significant change Confirmed by MILLER  MD, BRIAN ([id number]) on 06/06/2015  8:22:05 PM       8:32 PM Patient seen and examined. Work-up initiated. Pending head CT. Kidney function okay. EKG ok. BP currently improved 150's/50's. HA improved with percocet.    Vital signs reviewed and are as follows: BP 173/72 mmHg  Pulse 77  Temp(Src) 98.5 F (36.9 C) (Oral)  Resp 13  Ht '5\' 3"'  (1.6 m)  Wt 196 lb (88.905 kg)  BMI 34.73 kg/m2  SpO2 99%  9:50 PM Patient updated on results. States her HA has returned. Will give reglan/benadryl for HA. Discussed with Dr. Sabra Heck who will see.   11:22 PM Patient seen by Dr. Sabra Heck. Pt is feeling better. Will d/c to home with rx for amlodipine which patient had self-d/ced.   Patient counseled to return if they have weakness in their arms or legs, slurred speech, trouble walking or talking, confusion, trouble with their balance, or if they have any other concerns. Patient verbalizes understanding and agrees with plan.    MDM   Final diagnoses:  HA (headache)  Essential hypertension   HA: Neg CT. Ongoing for 4 weeks. Do not suspect occult SAH. PCP f/u indicated for further eval. Patient without high-risk features of headache including: sudden onset/thunderclap HA, no similar headache in past, altered mental status, accompanying seizure, headache with  exertion, history of immunocompromise, fever, use of anticoagulation, family history of spontaneous SAH, concomitant drug use, toxic exposure.   Patient has a normal complete neurological exam, normal vital signs, normal level of consciousness, no signs of meningismus, is well-appearing/non-toxic appearing, no signs of trauma, no pain over the temporal arteries.   No dangerous or life-threatening conditions suspected or identified by history, physical exam, and by work-up. No indications for hospitalization identified.      Carlisle Cater, PA-C 06/06/15 2324  Noemi Chapel, MD 06/07/15 [phone]

## 2015-06-08 ENCOUNTER — Encounter: Payer: Self-pay | Admitting: Family Medicine

## 2015-06-08 ENCOUNTER — Ambulatory Visit (INDEPENDENT_AMBULATORY_CARE_PROVIDER_SITE_OTHER): Payer: Medicare Other | Admitting: Family Medicine

## 2015-06-08 ENCOUNTER — Inpatient Hospital Stay (HOSPITAL_COMMUNITY)
Admission: AD | Admit: 2015-06-08 | Discharge: 2015-06-09 | DRG: 684 | Disposition: A | Discharge status: Home / Self Care | Payer: Medicare Other | Source: Ambulatory Visit | Attending: Family Medicine | Admitting: Family Medicine

## 2015-06-08 VITALS — BP 218/78 | HR 88 | Temp 99.0°F | Ht 63.0 in | Wt 192.4 lb

## 2015-06-08 DIAGNOSIS — E780 Pure hypercholesterolemia: Secondary | ICD-10-CM | POA: Diagnosis present

## 2015-06-08 DIAGNOSIS — F1490 Cocaine use, unspecified, uncomplicated: Secondary | ICD-10-CM

## 2015-06-08 DIAGNOSIS — Z91148 Patient's other noncompliance with medication regimen for other reason: Secondary | ICD-10-CM | POA: Diagnosis present

## 2015-06-08 DIAGNOSIS — I10 Essential (primary) hypertension: Secondary | ICD-10-CM

## 2015-06-08 DIAGNOSIS — H532 Diplopia: Secondary | ICD-10-CM | POA: Diagnosis present

## 2015-06-08 DIAGNOSIS — M5126 Other intervertebral disc displacement, lumbar region: Secondary | ICD-10-CM | POA: Diagnosis present

## 2015-06-08 DIAGNOSIS — Z794 Long term (current) use of insulin: Secondary | ICD-10-CM | POA: Diagnosis not present

## 2015-06-08 DIAGNOSIS — R319 Hematuria, unspecified: Secondary | ICD-10-CM | POA: Diagnosis present

## 2015-06-08 DIAGNOSIS — R51 Headache: Secondary | ICD-10-CM

## 2015-06-08 DIAGNOSIS — Z7982 Long term (current) use of aspirin: Secondary | ICD-10-CM | POA: Diagnosis not present

## 2015-06-08 DIAGNOSIS — F17210 Nicotine dependence, cigarettes, uncomplicated: Secondary | ICD-10-CM | POA: Diagnosis present

## 2015-06-08 DIAGNOSIS — Z9114 Patient's other noncompliance with medication regimen: Secondary | ICD-10-CM

## 2015-06-08 DIAGNOSIS — I161 Hypertensive emergency: Secondary | ICD-10-CM | POA: Diagnosis present

## 2015-06-08 DIAGNOSIS — G8929 Other chronic pain: Secondary | ICD-10-CM | POA: Diagnosis present

## 2015-06-08 DIAGNOSIS — I129 Hypertensive chronic kidney disease with stage 1 through stage 4 chronic kidney disease, or unspecified chronic kidney disease: Secondary | ICD-10-CM | POA: Diagnosis not present

## 2015-06-08 DIAGNOSIS — G4452 New daily persistent headache (NDPH): Secondary | ICD-10-CM | POA: Diagnosis not present

## 2015-06-08 DIAGNOSIS — D72829 Elevated white blood cell count, unspecified: Secondary | ICD-10-CM | POA: Diagnosis present

## 2015-06-08 DIAGNOSIS — I776 Arteritis, unspecified: Secondary | ICD-10-CM | POA: Diagnosis present

## 2015-06-08 DIAGNOSIS — I160 Hypertensive urgency: Secondary | ICD-10-CM | POA: Diagnosis present

## 2015-06-08 DIAGNOSIS — Z888 Allergy status to other drugs, medicaments and biological substances status: Secondary | ICD-10-CM | POA: Diagnosis not present

## 2015-06-08 DIAGNOSIS — R030 Elevated blood-pressure reading, without diagnosis of hypertension: Secondary | ICD-10-CM | POA: Diagnosis not present

## 2015-06-08 DIAGNOSIS — R519 Headache, unspecified: Secondary | ICD-10-CM | POA: Diagnosis present

## 2015-06-08 DIAGNOSIS — E1122 Type 2 diabetes mellitus with diabetic chronic kidney disease: Secondary | ICD-10-CM | POA: Diagnosis not present

## 2015-06-08 DIAGNOSIS — N183 Chronic kidney disease, stage 3 (moderate): Secondary | ICD-10-CM | POA: Diagnosis not present

## 2015-06-08 DIAGNOSIS — M1990 Unspecified osteoarthritis, unspecified site: Secondary | ICD-10-CM | POA: Diagnosis present

## 2015-06-08 DIAGNOSIS — Z90710 Acquired absence of both cervix and uterus: Secondary | ICD-10-CM | POA: Diagnosis not present

## 2015-06-08 HISTORY — DX: Cocaine use, unspecified, uncomplicated: F14.90

## 2015-06-08 LAB — COMPREHENSIVE METABOLIC PANEL
ALT: 17 U/L (ref 14–54)
AST: 23 U/L (ref 15–41)
Albumin: 2.9 g/dL — ABNORMAL LOW (ref 3.5–5.0)
Alkaline Phosphatase: 77 U/L (ref 38–126)
Anion gap: 7 (ref 5–15)
BUN: 17 mg/dL (ref 6–20)
CO2: 28 mmol/L (ref 22–32)
Calcium: 8.9 mg/dL (ref 8.9–10.3)
Chloride: 104 mmol/L (ref 101–111)
Creatinine, Ser: 1.16 mg/dL — ABNORMAL HIGH (ref 0.44–1.00)
GFR calc Af Amer: 54 mL/min — ABNORMAL LOW (ref >60)
GFR calc non Af Amer: 46 mL/min — ABNORMAL LOW (ref >60)
Glucose, Bld: 170 mg/dL — ABNORMAL HIGH (ref 65–99)
Potassium: 3.6 mmol/L (ref 3.5–5.1)
Sodium: 139 mmol/L (ref 135–145)
Total Bilirubin: 0.4 mg/dL (ref 0.3–1.2)
Total Protein: 6.1 g/dL — ABNORMAL LOW (ref 6.5–8.1)

## 2015-06-08 LAB — CBC
HCT: 31.6 % — ABNORMAL LOW (ref 36.0–46.0)
Hemoglobin: 10.4 g/dL — ABNORMAL LOW (ref 12.0–15.0)
MCH: 29.1 pg (ref 26.0–34.0)
MCHC: 32.9 g/dL (ref 30.0–36.0)
MCV: 88.3 fL (ref 78.0–100.0)
Platelets: 201 10*3/uL (ref 150–400)
RBC: 3.58 MIL/uL — ABNORMAL LOW (ref 3.87–5.11)
RDW: 12.5 % (ref 11.5–15.5)
WBC: 11 10*3/uL — ABNORMAL HIGH (ref 4.0–10.5)

## 2015-06-08 LAB — TSH: TSH: 0.618 u[IU]/mL (ref 0.350–4.500)

## 2015-06-08 LAB — RAPID URINE DRUG SCREEN, HOSP PERFORMED
Amphetamines: NOT DETECTED
Barbiturates: NOT DETECTED
Benzodiazepines: NOT DETECTED
Cocaine: NOT DETECTED
Opiates: NOT DETECTED
Tetrahydrocannabinol: NOT DETECTED

## 2015-06-08 LAB — URINALYSIS, ROUTINE W REFLEX MICROSCOPIC
Bilirubin Urine: NEGATIVE
Glucose, UA: 250 mg/dL — AB
Ketones, ur: NEGATIVE mg/dL
Nitrite: NEGATIVE
Protein, ur: >300 mg/dL — AB
Specific Gravity, Urine: 1.019 (ref 1.005–1.030)
Urobilinogen, UA: 1 mg/dL (ref 0.0–1.0)
pH: 5 (ref 5.0–8.0)

## 2015-06-08 LAB — URINE MICROSCOPIC-ADD ON

## 2015-06-08 LAB — MRSA PCR SCREENING: MRSA by PCR: NEGATIVE

## 2015-06-08 LAB — SEDIMENTATION RATE: Sed Rate: 75 mm/hr — ABNORMAL HIGH (ref 0–22)

## 2015-06-08 MED ORDER — CARVEDILOL 3.125 MG PO TABS
3.1250 mg | ORAL_TABLET | Freq: Two times a day (BID) | ORAL | Status: DC
  Administered 2015-06-08 – 2015-06-09 (×2): 3.125 mg via ORAL
  Filled 2015-06-08 (×4): qty 1

## 2015-06-08 MED ORDER — HYDRALAZINE HCL 20 MG/ML IJ SOLN: 5.0000 mg | Freq: Four times a day (QID) | INTRAMUSCULAR | Status: DC | PRN

## 2015-06-08 MED ORDER — INSULIN ASPART 100 UNIT/ML ~~LOC~~ SOLN
0.0000 [IU] | Freq: Three times a day (TID) | SUBCUTANEOUS | Status: DC
  Administered 2015-06-09: 2 [IU] via SUBCUTANEOUS

## 2015-06-08 MED ORDER — CLONIDINE HCL 0.1 MG PO TABS
0.1000 mg | ORAL_TABLET | Freq: Once | ORAL | Status: AC
  Administered 2015-06-08: 0.1 mg via ORAL

## 2015-06-08 MED ORDER — ATORVASTATIN CALCIUM 40 MG PO TABS
40.0000 mg | ORAL_TABLET | Freq: Every day | ORAL | Status: DC
  Administered 2015-06-09: 40 mg via ORAL
  Filled 2015-06-08: qty 1

## 2015-06-08 MED ORDER — AMLODIPINE BESYLATE 5 MG PO TABS
5.0000 mg | ORAL_TABLET | Freq: Every day | ORAL | Status: DC
  Administered 2015-06-09: 5 mg via ORAL
  Filled 2015-06-08: qty 1

## 2015-06-08 MED ORDER — ENALAPRIL MALEATE 10 MG PO TABS
10.0000 mg | ORAL_TABLET | Freq: Every evening | ORAL | Status: DC
  Filled 2015-06-08: qty 1

## 2015-06-08 MED ORDER — LABETALOL HCL 5 MG/ML IV SOLN: 10.0000 mg | Freq: Once | INTRAVENOUS | Status: DC

## 2015-06-08 MED ORDER — HEPARIN SODIUM (PORCINE) 5000 UNIT/ML IJ SOLN
5000.0000 [IU] | Freq: Three times a day (TID) | INTRAMUSCULAR | Status: DC
  Administered 2015-06-08 – 2015-06-09 (×2): 5000 [IU] via SUBCUTANEOUS
  Filled 2015-06-08 (×5): qty 1

## 2015-06-08 MED ORDER — ASPIRIN 81 MG PO CHEW
81.0000 mg | CHEWABLE_TABLET | Freq: Every day | ORAL | Status: DC
  Administered 2015-06-09: 81 mg via ORAL
  Filled 2015-06-08: qty 1

## 2015-06-08 MED ORDER — DIPHENHYDRAMINE HCL 25 MG PO CAPS
50.0000 mg | ORAL_CAPSULE | Freq: Once | ORAL | Status: AC
  Administered 2015-06-08: 50 mg via ORAL

## 2015-06-08 MED ORDER — INSULIN ASPART 100 UNIT/ML ~~LOC~~ SOLN: 0.0000 [IU] | Freq: Every day | SUBCUTANEOUS | Status: DC

## 2015-06-08 MED ORDER — HYDROCODONE-ACETAMINOPHEN 5-325 MG PO TABS
1.0000 | ORAL_TABLET | ORAL | Status: DC | PRN
  Administered 2015-06-08: 1 via ORAL
  Filled 2015-06-08: qty 1

## 2015-06-08 MED ORDER — INSULIN GLARGINE 100 UNIT/ML ~~LOC~~ SOLN
15.0000 [IU] | Freq: Every day | SUBCUTANEOUS | Status: DC
  Administered 2015-06-09: 15 [IU] via SUBCUTANEOUS
  Filled 2015-06-08: qty 0.15

## 2015-06-08 MED ORDER — SUMATRIPTAN SUCCINATE 6 MG/0.5ML ~~LOC~~ SOLN
6.0000 mg | Freq: Once | SUBCUTANEOUS | Status: AC
  Administered 2015-06-08: 6 mg via SUBCUTANEOUS

## 2015-06-08 NOTE — H&P (Signed)
[facility] Admission History and Physical Service Pager: [phone]  Patient name: Kimberly Carlson Medical record number: [id number] Date of birth: 1946/10/31 Age: 69 y.o. Gender: female  Primary Care Provider: Junie Panning, DO Consultants: none Code Status: full (confirmed on admit)  Chief Complaint: headache, elevated BP  Assessment and Plan: Kimberly Carlson is a 69 y.o. female presenting with elevated blood pressure and headache. PMH is significant for PMH is significant for HTN, DM, osteoarthritis and back pain.  Headache likely 2/2 poorly controlled hypertension: headache throbbing in nature, frontal and bilateral. Pt has SBP over 220 per her recent chart from ED visit. She is on multiple medications but at low dose. Less likely to be CVA given no acute neurological findings (Head CT 7/11 negative) or EKG unchanges.  Infectious etiologies are less likely given no recent illness or fever & chills (no neck stiffness, meningeal signs, AMS). Acute glaucoma is another possibility but less likely given no recent vision changes (chronic blurry vision, R-eye). It is less likely to be migraine given no photophobia or phonophobia, bilateral pain. Rebound headache is also a concern given her recent use of NSAID. Also considered less likely temporal arteritis (b/l HA, but temples non-tender and blurry vision chronic w/o vision loss, however age 79 and ESR 75) - Admit to Stepdown for close BP monitoring  - Hold off BB given hx of cocaine in her UDS in the past. She denies drug use but UDS is pending. - Carvedilol 3.125 mg bid - enalapril 10 mg daily - Hydralazine for SBP over 001VC and diastolic over 944HQPR. - Norco 5/325 q4h prn moderate to severe pain - ASA 81 mg daily - Check UDS, CMET, CBC - Consider MRI if HA does not resolve after controlling BP (will need sedation, discuss tomorrow)  Hypertension Urgency (headache, otherwise no evidence end organ  damage) - Improved Recent course with chronic uncontrolled HTN seems labile. ED on 7/11 for HTN urgency SBP >200. [facility] today with SBP >230. Received Clonidine PO 0.50m x 1 in clinic. Repeat BP improved mildly < 200 then again increased. On admit, repeat BP's significantly improved 130s-160 SBP without any further intervention.  - Antihypertensive meds as above.  DM, Type 2: on lantus 25 untis daily. A1c 6.1 in 01/2015 - lantus 15 units daily - sensitive SSI - check CBGs q AC + HS  CKD Stage II-III, Stable. Likely secondary to chronic HTN / DM SCr 1.16 on admission. B/l appears to be 1.0-1.2 - Am BMP  Osteoarthritis/back pain: Chronic condition, without active pain - Norco as above  Tobacco Abuse: a pack a day. Declines nicotine patch. - Encourage to quit smoking   FEN/GI F: saline lock E: replete as needed N: heart healthy / carb mod diet GI: protonix  Prophylaxsis: heparin SubQ  Disposition: step down. Home up on discharge.  History of Present Illness: Kimberly WOLLENBERGis a 69y.o. female, admit from clinic with elevated BP and severe headache..Marland Kitchen PMH is significant for HTN, DM and back pain.  Currently describes recent course with headache for past 4 weeks with gradual worsening, did improve with medicines (NSAIDs, ibuprofen) initially, then became too severe she went to UC on 7/11 and sent to ED with elevated BP (SBP >200), [facility] The headache is frontal bilaterally. It does radiate to both eyes. Describes headache as "throbbing pain", up to 9/10 severity,  constant for past few days, previously was more intermittent. Did not respond to Advil. Currently significantly improved down to 5/10. Regarding her BP she reports recent discontinued Amlodipine for about 3 weeks. She reports reading a "side-effect warning" about it whenever she tries to order it via mail order, concerned so she  didn't order it. Admits associated nausea (without vomiting) with headache, generalized weakness (chronic, without recent changes) She denies chest pain, sob, vision changes, previous CVA, numbness or weakness in her extremities, denies any difficulties with speech or facial droop. She reports smoking a pack a day. Denies drinking or illicit drug use. Denies and life changes or stressors.  Review Of Systems: Per HPI. Otherwise 12 point review of systems was performed and was unremarkable.  Patient Active Problem List   Diagnosis Date Noted  . Headache 06/08/2015  . Hypertensive urgency 06/08/2015  . Hypertensive emergency 06/08/2015  . Cocaine use 06/08/2015  . Cephalalgia   . Noncompliance with medications   . Knee pain, bilateral 03/24/2014  . UTI (urinary tract infection) 05/27/2013  . Resting tremor 05/07/2012  . DIPLOPIA 11/28/2010  . DEGENERATIVE DISC DISEASE 11/09/2009  . TOBACCO USER 09/03/2009  . OBESITY 07/08/2009  . HERNIATED LUMBAR DISC 05/14/2007  . DM (diabetes mellitus), type 2, uncontrolled 01/23/2007  . HYPERCHOLESTEROLEMIA 01/23/2007  . HYPERTENSION, BENIGN SYSTEMIC 01/23/2007   Past Medical History: Past Medical History  Diagnosis Date  . Hypertension   . Diabetes mellitus without complication   . Back pain     herniated disc lower back   Past Surgical History: Past Surgical History  Procedure Laterality Date  . Abdominal hysterectomy     Social History: History  Substance Use Topics  . Smoking status: Current Every Day Smoker -- 1.00 packs/day    Types: Cigarettes    Start date: 04/26/2014  . Smokeless tobacco: Never Used     Comment: Started in her 69's with multiple quit attempts for about 1 year at a time.  . Alcohol Use: No   Additional social history: Active smoker. Denies any alcohol, other drug use. Please also refer to relevant sections of EMR.  Family History: Family History  Problem Relation Age of Onset  . Adopted: Yes    Allergies and Medications: Allergies  Allergen Reactions  . Metformin Diarrhea   No current facility-administered medications on file prior to encounter.   Current Outpatient Prescriptions on File Prior to Encounter  Medication Sig Dispense Refill  . amLODipine (NORVASC) 5 MG tablet Take 1 tablet (5 mg total) by mouth daily. 30 tablet 1  . aspirin (ASPIRIN CHILDRENS) 81 MG chewable tablet Chew 81 mg by mouth daily.      Marland Kitchen atorvastatin (LIPITOR) 40 MG tablet Take 1 tablet (40 mg total) by mouth daily. 90 tablet 3  . carvedilol (COREG) 3.125 MG tablet Take 1 tablet (3.125 mg total) by mouth 2 (two) times daily with a meal. 60 tablet 2  . enalapril (VASOTEC) 10 MG tablet Take 1 tablet (10 mg total) by mouth every evening. 90 tablet 3  . ibuprofen (ADVIL,MOTRIN) 200 MG tablet Take 400 mg by mouth every 6 (six) hours as needed for headache or moderate pain.     Marland Kitchen LANTUS 100 UNIT/ML injection INJECT 25 UNITS DAILY 10 mL 2  . Blood Glucose Monitoring Suppl (ONE TOUCH ULTRA SYSTEM KIT) W/DEVICE KIT 1 kit by Does not apply route See admin instructions. Use to check blood sugar once daily.    Marland Kitchen glucose blood (ONE TOUCH TEST STRIPS)  test strip 1 each by Other route See admin instructions. Check blood sugar once daily.    . Insulin Syringe-Needle U-100 (INSULIN SYRINGE 1CC/31GX5/16") 31G X 5/16" 1 ML MISC 1 each by Does not apply route See admin instructions. Use daily with insulin. 100 each 11  . ONE TOUCH LANCETS MISC 1 each by Does not apply route See admin instructions. Check blood sugar once daily. 200 each 11  . [DISCONTINUED] simvastatin (ZOCOR) 80 MG tablet take 1 tablet by mouth at bedtime 90 tablet 0    Objective: BP 167/62 mmHg  Pulse 80  Temp(Src) 98.2 F (36.8 C) (Oral)  Resp 12  Ht 5' 3" (1.6 m)  Wt 192 lb 10.9 oz (87.4 kg)  BMI 34.14 kg/m2  SpO2 97% Exam: Gen: No acute distress. Alert, cooperative with exam. Well-appearing, comfortable HEENT: [place] Atraumatic, sinuses / temples  & scalp non-tender, EOMI, PERRLA, Oropharynx clear. MMM CV: RRR. No murmurs, rubs, or gallops noted. 2+ radial and DP pulses bilaterally. Resp: CTAB. No wheezing, crackles, or rhonchi noted. Good air movement. Non-labored. Abd: +BS. Soft, non-distended, non-tender. No rebound or guarding.  Ext: No edema. Non-tender. No gross deformities. Neuro: AAOx3, CN II-XII, motor 5/5 grip, biceps flex, knee flex, ankle dorsiflex, sensation intact in all ext, cerebellar testing intact finger to nose.  Labs and Imaging: CBC BMET   Recent Labs Lab 06/08/15 1858  WBC 11.0*  HGB 10.4*  HCT 31.6*  PLT 201    Recent Labs Lab 06/08/15 1858  NA 139  K 3.6  CL 104  CO2 28  BUN 17  CREATININE 1.16*  GLUCOSE 170*  CALCIUM 8.9     No results found.   7/13 UDS - Negative  UA - hyaline casts, protein >300, mod hgb, RBC 7-10, sm leuk, neg nitrite, WBC 21-50  TSH - 0.618  ESR - 75 (no comparison)  Recent Head CT w/o contrast (7/11) No acute abnormality  Wendee Beavers, MD [place], PGY-1 06/08/2015, 11:53 PM [place] Intern pager: [phone], text pages welcome  Upper Level Addendum:  I have seen and evaluated this patient along with Dr. Cyndia Skeeters and reviewed the above note, making necessary revisions in purple.

## 2015-06-08 NOTE — Assessment & Plan Note (Signed)
Possible due to elevated BP vs rebound HA 2/2 NSAIDs use. Low suspicion for intracranial mass aneurysm as headache completely resolves between episodes and recent negative CT head and work-up in ED 2 days ago.  - Consider MRI if headache not resolved with BP control and stopping NSAIDs - UDS - Check ESR, to evaluate for temporal arteritis

## 2015-06-08 NOTE — Progress Notes (Signed)
  Patient name: Kimberly Carlson MRN [id number]  Date of birth: Feb 27, 1946  CC & HPI:  Kimberly Carlson is a 69 y.o. female presenting today for elevated blood pressure. She reports checking her blood pressure at home and having SBPs in 180s. She says her BP remained stable after stopping HCTZ on 03/07/15 with home SBP readings in the 150s. Then she developed (7-9/10) headache about 4 weeks ago. The HA is left frontal. It would last for 3-4 hours then resolve completely. This occurred several times a day, every day for the past month. She noticed her BP has been elevated for the past week but had not checked her BP for several weeks prior to that. She does report stopping her Norvasc about 3 weeks ago due to reading "warnings" about it; however she reports the HAs started prior to stopping that medication. She has been using 400 mg ibuprofen 3-4 times daily for her HAs. She continues to smoke. Denies previous CVA; Denies any numbness or weakness in her extremities, denies any difficulties with speech or facial droop.  She reports blurry vision in her right eye that is unchanged and chronic.  Denies vision changes in her left eye, but does report some left eye pain.  Denies any illicit drug use.   ROS: See HPI   Medical & Surgical Hx:  Reviewed  Medications & Allergies: Reviewed  Social History: Reviewed:   Objective Findings:  Vitals: BP 218/78 mmHg  Pulse 88  Temp(Src) 99 F (37.2 C) (Oral)  Ht 5\' 3"  (1.6 m)  Wt 192 lb 7 oz (87.289 kg)  BMI 34.10 kg/m2  SpO2 98%  Gen: NAD Eyes:Sclera white; Conjunctiva pink; Pupils small but ERRLA; EOMI; Opthlamic exam attempted but difficult to visualize fundus due to small pupils Nose: Mucosa pink; No sinus tenderness Throat: Oral mucosa pink, Pharynx w/o exudates CV: RRR w/o m/r/g, pulses +2 b/l Resp: CTAB w/ normal respiratory effort Neuro: A&Ox4; Short & Long term memory intact; Gross Sensory & Motor intact  Assessment & Plan:   Please See  Problem Focused Assessment & Plan

## 2015-06-08 NOTE — Progress Notes (Signed)
Patient got here around 5pm wheeled by transport from the admitting unit, have not received any report,MD was paged to verify the order because the order is to admit to telemetry floor, spoke to Dr. Venia Carbon and he said the pt needs to be admitted in stepdown for hourly monitoring, BP is 161 /52.

## 2015-06-08 NOTE — Assessment & Plan Note (Signed)
Difficult to ascertain if blood pressure elevated due to headache or headache secondary to hypertensive urgency.  BP possibly effected by NSAIDs use for past month and possible Drug use (previous UDS + for Cocaine, though patient denies). Unlikely 2/2 glaucoma as reports eye pain mild compared to HA - No acute neurological findings, EKG unchanged from recent ED visit for same complaint - Admit to Stepdown for close BP monitoring  - Labetalol 10mg  once - Check UDS, CMET, CBC - Consider MRI if HA does not resolve after controlling BP and stopping NSAIDs

## 2015-06-08 NOTE — Progress Notes (Signed)
Call Pager [phone] for any questions or notifications regarding this patient  FMTS Attending Admission Note: Kimberly Mcmurray MD Attending pager:[phone] I  have seen and examined this patient in the family medicine clinic this afternoon. I have  reviewed her chart and discussed with Drs Berkley Harvey and Parks Ranger. Please see the admission note from Dr Parks Ranger for complete details. I agree with plan   1. Hypertensive urgency with blood pressure to 99991111 systolic range in clinic. This has been a problem for about the last 1 week (intermittently elevated blood pressures noticed by patient). Possible  non compliance with medications. 2. HEadache: In last 1 month she has had th diffuse headache. Was seen at [facility] on July 11 for similar symptoms and had a negative CT scan of head at that time. Per that note she had not been totally compliant with her amlodipine in last 1 month.  3. Blurry vision (chronic).She has some chronic issues with blurry vision in her right eye and that is unchanged today  Was given some sumatriptan in clinic without any relief of her headache.  I discussed options with her and we agreed hospitalization for further workup and managemnt of BO and headache tonight. I also recommended MRI scan--she is quite claustrophobic and will liklely require sedation. She is not too keen on getting this done but will consider it for tomorrow. I think given the length of time she has had symptoms, it would be a reasonable part of the work up and certainly easier to get done inpatient with her need for sedation. Alternatively, if her BO improves with antihypertensives and her headache symptoms resolve, we could hold off on that for a while. 4. Remote history in chart of a UDS positive for cocaine (2013) so we will repeat that on admission.

## 2015-06-09 DIAGNOSIS — R319 Hematuria, unspecified: Secondary | ICD-10-CM | POA: Diagnosis present

## 2015-06-09 HISTORY — DX: Hematuria, unspecified: R31.9

## 2015-06-09 LAB — GLUCOSE, CAPILLARY
Glucose-Capillary: 174 mg/dL — ABNORMAL HIGH (ref 65–99)
Glucose-Capillary: 104 mg/dL — ABNORMAL HIGH (ref 65–99)

## 2015-06-09 MED ORDER — INSULIN GLARGINE 100 UNIT/ML ~~LOC~~ SOLN: 25.0000 [IU] | Freq: Every day | SUBCUTANEOUS | Status: DC

## 2015-06-09 MED ORDER — INSULIN ASPART 100 UNIT/ML ~~LOC~~ SOLN: 0.0000 [IU] | Freq: Three times a day (TID) | SUBCUTANEOUS | Status: DC

## 2015-06-09 NOTE — Progress Notes (Signed)
Family Medicine Teaching Service Daily Progress Note Intern Pager: [phone]  Patient name: Kimberly Carlson Medical record number: [id number] Date of birth: 1946-08-18 Age: 69 y.o. Gender: female  Primary Care Provider: Junie Panning, DO Consultants: none  Code Status: full  Pt Overview and Major Events to Date:  Admitted with hypertensive urgency and headache.  Assessment and Plan: Kimberly Carlson is a 69 y.o. female presenting with elevated blood pressure and headache. PMH is significant for PMH is significant for HTN, DM, osteoarthritis and back pain.  Headache likely 2/2 poorly controlled hypertension: headache throbbing in nature, frontal and bilateral. Pt has SBP over 220 per her recent chart from ED visit. She is on multiple medications but at low dose. Less likely to be CVA given no acute neurological findings (Head CT 7/11 negative) or EKG NSR. Infectious etiologies are less likely given no recent illness or fever & chills (no neck stiffness, meningeal signs, AMS). CBC s/o sig leukocytosis. Acute glaucoma is another possibility but less likely given no recent vision changes (chronic blurry vision, R-eye). It is less likely to be migraine given no photophobia or phonophobia, bilateral pain. Rebound headache is also a concern given her recent use of NSAID. Also considered less likely temporal arteritis (b/l HA, but temples non-tender and blurry vision chronic w/o vision loss, however age 52 and ESR 75). UDS neg. CMP WNL except for cr of 1.16 and alb of 2.9. - Admit to Stepdown for close BP monitoring - Carvedilol 3.125 mg bid - enalapril 10 mg daily - Hydralazine for SBP over 527PO and diastolic over 242PNTI. - Norco 5/325 q4h prn moderate to severe pain - ASA 81 mg daily - Consider MRI if HA does not resolve after controlling BP (will need sedation, discuss tomorrow)  Hypertension Urgency (headache, otherwise no evidence end organ damage) - Improved Recent course with  chronic uncontrolled HTN seems labile. ED on 7/11 for HTN urgency SBP >200. [facility] today with SBP >230. Received Clonidine PO 0.29m x 1 in clinic. Repeat BP improved mildly < 200 then again increased. On admit, repeat BP's significantly improved 130s-160 SBP without any further intervention.  - Antihypertensive meds as above.  DM, Type 2: on lantus 25 untis daily. A1c 6.1 in 01/2015 - lantus 15 units daily - sensitive SSI - check CBGs q AC + HS  CKD Stage II-III, Stable. Likely secondary to chronic HTN / DM SCr 1.16 on admission. B/l appears to be 1.0-1.2 - Am BMP  Osteoarthritis/back pain: Chronic condition, without active pain - Norco as above  Tobacco Abuse: a pack a day. Declines nicotine patch. - Encourage to quit smoking   FEN/GI F: saline lock E: replete as needed N: heart healthy / carb mod diet GI: protonix  Prophylaxsis: heparin SubQ  Disposition: step down. Home up on discharge.    Subjective:  Reports her pain is improved, about 3/10. Pain is over her left forehead. Reports one episode of chest pain overnight while sitting on bed that lasted 10 minutes. She pointed to her epigastric area when asked to localize the pain. She thinks it is her GERD. It is on the left side. She denies any neurologic symptoms.  Objective: Temp:  [97.8 F (36.6 C)-99 F (37.2 C)] 98.1 F (36.7 C) (07/14 0415) Pulse Rate:  [62-88] 62 (07/14 0600) Resp:  [12-17] 13 (07/14 0600) BP: (114-232)/(43-88) 119/58 mmHg (07/14 0600) SpO2:  [95 %-98 %] 97 % (07/14 0600) Weight:  [192 lb 7 oz (87.289 kg)-192 lb 10.9 oz (  87.4 kg)] 192 lb 10.9 oz (87.4 kg) (07/13 1713)   Physical Exam: Gen: No acute distress. Alert, cooperative with exam. Well-appearing, comfortable HEENT: [place] Atraumatic, temples & scalp non-tender, EOMI, PERRLA, Oropharynx clear. MMM CV: RRR. No murmurs, rubs, or gallops noted. 2+ radial and DP pulses bilaterally. Resp: CTAB. No wheezing, crackles, or rhonchi noted. Good air  movement. Non-labored. Abd: +BS. Soft, non-distended, non-tender. No rebound or guarding.  Ext: No edema. Non-tender. No gross deformities. Neuro: grossly intact.  Laboratory:  Recent Labs Lab 06/06/15 1952 06/08/15 1858  WBC 11.6* 11.0*  HGB 10.6* 10.4*  HCT 32.3* 31.6*  PLT 205 201    Recent Labs Lab 06/06/15 1952 06/08/15 1858  NA 141 139  K 3.9 3.6  CL 106 104  CO2 28 28  BUN 21* 17  CREATININE 1.14* 1.16*  CALCIUM 9.0 8.9  PROT  --  6.1*  BILITOT  --  0.4  ALKPHOS  --  77  ALT  --  17  AST  --  23  GLUCOSE 145* 170*    Imaging/Diagnostic Tests:  Mercy Riding, MD 06/09/2015, 6:50 AM PGY-1, [place] Intern pager: [phone], text pages welcome

## 2015-06-09 NOTE — Discharge Instructions (Signed)
It is nice taking care of you! You were admitted with headache and high blood pressure. After running  some tests we think your headache is likely from elevated blood pressure. It could also be a rebound from previous use of ibuprofen. We have given you some medications to treat your headache and blood pressure which have improved both.  We are sending you home on some medications that you should continue taking at home. Make sure you read the directions on this discharge paper under section of mediction list.   We would like to follow up at clinic for ambulatory BP. The name, address and time for clinic is listed under the section that says follow up. Please, make sure to call and set an appointment as soon as possible.   Things to watch: Severe headache Weakness, numbness or any odd feeling in part or whole body Changes in your speech Vision changes Very high blood pressure Fever, chills or fainting Trouble breathing, chest pain  Below are some information for you to read about headache and blood pressure.  Hypertension Hypertension, commonly called high blood pressure, is when the force of blood pumping through your arteries is too strong. Your arteries are the blood vessels that carry blood from your heart throughout your body. A blood pressure reading consists of a higher number over a lower number, such as 110/72. The higher number (systolic) is the pressure inside your arteries when your heart pumps. The lower number (diastolic) is the pressure inside your arteries when your heart relaxes. Ideally you want your blood pressure below 120/80. Hypertension forces your heart to work harder to pump blood. Your arteries may become narrow or stiff. Having hypertension puts you at risk for heart disease, stroke, and other problems.  RISK FACTORS Some risk factors for high blood pressure are controllable. Others are not.  Risk factors you cannot control include:   Race. You may be at higher  risk if you are African American.  Age. Risk increases with age.  Gender. Men are at higher risk than women before age 17 years. After age 53, women are at higher risk than men. Risk factors you can control include:  Not getting enough exercise or physical activity.  Being overweight.  Getting too much fat, sugar, calories, or salt in your diet.  Drinking too much alcohol. SIGNS AND SYMPTOMS Hypertension does not usually cause signs or symptoms. Extremely high blood pressure (hypertensive crisis) may cause headache, anxiety, shortness of breath, and nosebleed. DIAGNOSIS  To check if you have hypertension, your health care provider will measure your blood pressure while you are seated, with your arm held at the level of your heart. It should be measured at least twice using the same arm. Certain conditions can cause a difference in blood pressure between your right and left arms. A blood pressure reading that is higher than normal on one occasion does not mean that you need treatment. If one blood pressure reading is high, ask your health care provider about having it checked again. TREATMENT  Treating high blood pressure includes making lifestyle changes and possibly taking medicine. Living a healthy lifestyle can help lower high blood pressure. You may need to change some of your habits. Lifestyle changes may include:  Following the DASH diet. This diet is high in fruits, vegetables, and whole grains. It is low in salt, red meat, and added sugars.  Getting at least 2 hours of brisk physical activity every week.  Losing weight if necessary.  Not  smoking.  Limiting alcoholic beverages.  Learning ways to reduce stress. If lifestyle changes are not enough to get your blood pressure under control, your health care provider may prescribe medicine. You may need to take more than one. Work closely with your health care provider to understand the risks and benefits. HOME CARE  INSTRUCTIONS  Have your blood pressure rechecked as directed by your health care provider.   Take medicines only as directed by your health care provider. Follow the directions carefully. Blood pressure medicines must be taken as prescribed. The medicine does not work as well when you skip doses. Skipping doses also puts you at risk for problems.   Do not smoke.   Monitor your blood pressure at home as directed by your health care provider. SEEK MEDICAL CARE IF:   You think you are having a reaction to medicines taken.  You have recurrent headaches or feel dizzy.  You have swelling in your ankles.  You have trouble with your vision. SEEK IMMEDIATE MEDICAL CARE IF:  You develop a severe headache or confusion.  You have unusual weakness, numbness, or feel faint.  You have severe chest or abdominal pain.  You vomit repeatedly.  You have trouble breathing. MAKE SURE YOU:   Understand these instructions.  Will watch your condition.  Will get help right away if you are not doing well or get worse. Document Released: 11/12/2005 Document Revised: 03/29/2014 Document Reviewed: 09/04/2013 [facility] Patient Information 2015 [place], [place]. This information is not intended to replace advice given to you by your health care provider. Make sure you discuss any questions you have with your health care provider.    Analgesic Rebound Headaches An analgesic rebound headache is a headache that returns after pain medicine (analgesic) that was taken to treat the initial headache wears off. People who suffer from tension, migraine, or cluster headaches are at risk for developing rebound headaches. Any type of primary headache can return as a rebound headache if you regularly take analgesics more than three times a week. If the cycle of rebound headaches continues, they become chronic daily headaches.  CAUSES Analgesics frequently associated with this problem include common over-the-counter  medicines like aspirin, ibuprofen, acetaminophen, sinus relief medicines, and other medicines that contain caffeine. Narcotic pain medicines are also a common cause of rebound headaches.  SIGNS AND SYMPTOMS The symptoms of rebound headaches are the same as the symptoms of your initial headache. Symptoms of specific types of headaches include: Tension headache  Pressure around the head.  Dull, aching head pain.  Pain felt over the front and sides of the head.  Tenderness in the muscles of the head, neck and shoulders. Migraine Headache  Pulsing or throbbing pain on one or both sides of the head.  Severe pain that interferes with daily activities.  Pain that is worsened by physical activity.  Nausea, vomiting, or both.  Pain with exposure to bright light, loud noises, or strong smells.  General sensitivity to bright light, loud noises, or strong smells.  Visual changes.  Numbness of one or both arms. Cluster Headaches  Severe pain that begins in or around one eye or temple.  Redness in the eye on the same side as the pain.  Droopy or swollen eyelid.  One-sided head pain.  Nausea.  Runny nose.  Sweaty, pale facial skin.  Restlessness. DIAGNOSIS  Analgesic rebound headaches are diagnosed by reviewing your medical history. This includes the nature of your initial headaches, as well as the type of  pain medicines you have been using to treat your headaches and how often you take them. TREATMENT Discontinuing frequent use of the analgesic medicine will typically reduce the frequency of the rebound episodes. This may initially worsen your headaches but eventually the pain should become more manageable, less frequent, and less severe.  Seeing a headache specialists may helpful. He or she may be able to help you manage your headaches and to make sure there is not another cause of the headaches. Alternative methods of stress relief such as acupuncture, counseling, biofeedback,  and massage may also be helpful. Talk with your health care provider about which alternative treatments might be good for you. HOME CARE INSTRUCTIONS Stopping the regular use of pain medicine can be difficult. Follow your health care provider's instructions carefully. Keep all of your appointments. Avoid triggers that are known to cause your primary headaches. SEEK MEDICAL CARE IF: You continue to experience headaches after following your health care provider's recommended treatments. SEEK IMMEDIATE MEDICAL CARE IF:  You develop new headache pain.  You develop headache pain that is different than what you have experienced in the past.  You develop numbness or tingling in your arms or legs.  You develop changes in your speech or vision. MAKE SURE YOU:  Understand these instructions.  Will watch your child's condition.  Will get help right away if your child is not doing well or gets worse. Document Released: 02/02/2004 Document Revised: 03/29/2014 Document Reviewed: 05/28/2013 [facility] Patient Information 2015 [place], [place]. This information is not intended to replace advice given to you by your health care provider. Make sure you discuss any questions you have with your health care provider.

## 2015-06-09 NOTE — Discharge Summary (Signed)
Physician Discharge Summary  Patient ID: Kimberly Carlson MRN: [id number] DOB/AGE: 08/17/1946 69 y.o.  Admit date: 06/08/2015 Discharge date: 06/09/2015  Admission Diagnoses: headache and hypertensive urgency  Discharge Diagnoses: hypertension Principal Problem:   Hypertensive emergency Active Problems:   Hypertensive urgency   Cephalalgia   Noncompliance with medications   Hematuria, undiagnosed cause   Discharged Condition: stable  Hospital Course: Kimberly Carlson is a 69 y.o. female presenting with elevated blood pressure and headache. PMH is significant for PMH is significant for HTN, DM, osteoarthritis and back pain.  Headache: improved. likely 2/2 poorly controlled hypertension. Throbbing in nature, frontal and bilateral. Pt has SBP over 220 per her recent chart from ED visit. She is on multiple medications but at low dose. Less likely to be CVA given no acute neurological findings (Head CT 7/11 negative) or EKG NSR. Infectious etiologies are less likely given no recent illness or fever & chills (no neck stiffness, meningeal signs, AMS). CBC w/o sig leukocytosis. Acute glaucoma is another possibility but less likely given no recent vision changes (chronic blurry vision, R-eye). Migraine less likely given no photophobia or phonophobia.  Rebound headache is a concern given her recent use of NSAID. Also considered less likely temporal arteritis (b/l HA, but temples non-tender and blurry vision chronic, contralateral w/o vision loss, however age 48 and ESR 75). UDS neg. CMP WNL except for cr of 1.16 and alb of 2.9. Carvedilol 3.125 mg bid, enalapril 10 mg daily, Norco 5/325 q4h prn moderate to severe pain, A 81 mg daily. Headache improved to 3/10 the following morning with good control of BP.  Hypertension Urgency (headache, otherwise no evidence end organ damage) - Improved Recent course with chronic uncontrolled HTN seems labile. ED on 7/11 for HTN urgency SBP >200. [facility] today  with SBP >230. Received Clonidine PO 0.50m x 1 in clinic. Repeat BP improved mildly < 200 then again increased. On admit, repeat BP's significantly improved 130s-160 SBP without any further intervention.   Other chronic conditions stable.  Consults: None  Significant Diagnostic Studies: none  Things to follow up: -Hypertension: for ambulatory BP with Dr. KValentina Lucks-Hematuria: elevated RBC in her UA chronically  Discharge Exam: Blood pressure 161/68, pulse 75, temperature 98.5 F (36.9 C), temperature source Oral, resp. rate 19, height _0  (1.6 m), weight 192 lb 10.9 oz (87.4 kg), SpO2 97 %.  Physical Exam: See the progress note from day of discharge.  Disposition: 01-Home or Self Care     Medication List    ASK your doctor about these medications        amLODipine 5 MG tablet  Commonly known as:  NORVASC  Take 1 tablet (5 mg total) by mouth daily.     ASPIRIN CHILDRENS 81 MG chewable tablet  Generic drug:  aspirin  Chew 81 mg by mouth daily.     atorvastatin 40 MG tablet  Commonly known as:  LIPITOR  Take 1 tablet (40 mg total) by mouth daily.     carvedilol 3.125 MG tablet  Commonly known as:  COREG  Take 1 tablet (3.125 mg total) by mouth 2 (two) times daily with a meal.     enalapril 10 MG tablet  Commonly known as:  VASOTEC  Take 1 tablet (10 mg total) by mouth every evening.     ibuprofen 200 MG tablet  Commonly known as:  ADVIL,MOTRIN  Take 400 mg by mouth every 6 (six) hours as needed for headache or moderate pain.  INSULIN SYRINGE 1CC/31GX5/16" 31G X 5/16" 1 ML Misc  1 each by Does not apply route See admin instructions. Use daily with insulin.     LANTUS 100 UNIT/ML injection  Generic drug:  insulin glargine  INJECT 25 UNITS DAILY     ONE TOUCH LANCETS Misc  1 each by Does not apply route See admin instructions. Check blood sugar once daily.     ONE TOUCH TEST STRIPS test strip  Generic drug:  glucose blood  1 each by Other route See admin  instructions. Check blood sugar once daily.     ONE TOUCH ULTRA SYSTEM KIT W/DEVICE Kit  1 kit by Does not apply route See admin instructions. Use to check blood sugar once daily.           Follow-up Information    Schedule an appointment as soon as possible for a visit with [facility], DO.   Specialty:  Family Medicine   Why:  hospital follow up.   Contact information:   [address] [place] [address] [phone]       Please follow up.   Why:  hospital follow up for Amb BP and smoking      Signed: Mercy Riding 06/09/2015, 12:39 PM

## 2015-06-10 ENCOUNTER — Encounter: Payer: Self-pay | Admitting: Family Medicine

## 2015-06-10 NOTE — Progress Notes (Signed)
Utilization Review Completed.  

## 2015-06-13 ENCOUNTER — Other Ambulatory Visit: Payer: Self-pay | Admitting: Family Medicine

## 2015-06-13 DIAGNOSIS — I10 Essential (primary) hypertension: Secondary | ICD-10-CM

## 2015-06-13 MED ORDER — CARVEDILOL 3.125 MG PO TABS: 3.1250 mg | ORAL_TABLET | Freq: Two times a day (BID) | ORAL | Status: DC

## 2015-06-20 ENCOUNTER — Observation Stay (HOSPITAL_COMMUNITY)
Admission: EM | Admit: 2015-06-20 | Discharge: 2015-06-22 | Disposition: A | Discharge status: Home / Self Care | Payer: Medicare Other | Attending: Family Medicine | Admitting: Family Medicine

## 2015-06-20 ENCOUNTER — Encounter (HOSPITAL_COMMUNITY): Payer: Self-pay | Admitting: Emergency Medicine

## 2015-06-20 DIAGNOSIS — R51 Headache: Secondary | ICD-10-CM | POA: Diagnosis not present

## 2015-06-20 DIAGNOSIS — I129 Hypertensive chronic kidney disease with stage 1 through stage 4 chronic kidney disease, or unspecified chronic kidney disease: Secondary | ICD-10-CM | POA: Insufficient documentation

## 2015-06-20 DIAGNOSIS — G8929 Other chronic pain: Secondary | ICD-10-CM | POA: Diagnosis not present

## 2015-06-20 DIAGNOSIS — R519 Headache, unspecified: Secondary | ICD-10-CM | POA: Diagnosis present

## 2015-06-20 DIAGNOSIS — E7800 Pure hypercholesterolemia, unspecified: Secondary | ICD-10-CM | POA: Diagnosis present

## 2015-06-20 DIAGNOSIS — Z794 Long term (current) use of insulin: Secondary | ICD-10-CM | POA: Diagnosis not present

## 2015-06-20 DIAGNOSIS — N183 Chronic kidney disease, stage 3 (moderate): Secondary | ICD-10-CM | POA: Diagnosis not present

## 2015-06-20 DIAGNOSIS — E119 Type 2 diabetes mellitus without complications: Secondary | ICD-10-CM | POA: Diagnosis present

## 2015-06-20 DIAGNOSIS — E669 Obesity, unspecified: Secondary | ICD-10-CM | POA: Diagnosis present

## 2015-06-20 DIAGNOSIS — I160 Hypertensive urgency: Secondary | ICD-10-CM

## 2015-06-20 DIAGNOSIS — I10 Essential (primary) hypertension: Secondary | ICD-10-CM | POA: Diagnosis not present

## 2015-06-20 DIAGNOSIS — M1990 Unspecified osteoarthritis, unspecified site: Secondary | ICD-10-CM | POA: Insufficient documentation

## 2015-06-20 DIAGNOSIS — Z7982 Long term (current) use of aspirin: Secondary | ICD-10-CM | POA: Diagnosis not present

## 2015-06-20 DIAGNOSIS — E785 Hyperlipidemia, unspecified: Secondary | ICD-10-CM | POA: Diagnosis not present

## 2015-06-20 DIAGNOSIS — F17210 Nicotine dependence, cigarettes, uncomplicated: Secondary | ICD-10-CM | POA: Insufficient documentation

## 2015-06-20 DIAGNOSIS — E1169 Type 2 diabetes mellitus with other specified complication: Secondary | ICD-10-CM | POA: Diagnosis present

## 2015-06-20 LAB — URINALYSIS, ROUTINE W REFLEX MICROSCOPIC
Bilirubin Urine: NEGATIVE
Glucose, UA: NEGATIVE mg/dL
Ketones, ur: NEGATIVE mg/dL
Nitrite: NEGATIVE
Protein, ur: >300 mg/dL — AB
Specific Gravity, Urine: 1.007 (ref 1.005–1.030)
Urobilinogen, UA: 0.2 mg/dL (ref 0.0–1.0)
pH: 7 (ref 5.0–8.0)

## 2015-06-20 LAB — CBC WITH DIFFERENTIAL/PLATELET
Basophils Absolute: 0 10*3/uL (ref 0.0–0.1)
Basophils Relative: 0 % (ref 0–1)
Eosinophils Absolute: 0.2 10*3/uL (ref 0.0–0.7)
Eosinophils Relative: 2 % (ref 0–5)
HCT: 32.3 % — ABNORMAL LOW (ref 36.0–46.0)
Hemoglobin: 10.8 g/dL — ABNORMAL LOW (ref 12.0–15.0)
Lymphocytes Relative: 24 % (ref 12–46)
Lymphs Abs: 3.1 10*3/uL (ref 0.7–4.0)
MCH: 30 pg (ref 26.0–34.0)
MCHC: 33.4 g/dL (ref 30.0–36.0)
MCV: 89.7 fL (ref 78.0–100.0)
Monocytes Absolute: 0.8 10*3/uL (ref 0.1–1.0)
Monocytes Relative: 6 % (ref 3–12)
Neutro Abs: 9.1 10*3/uL — ABNORMAL HIGH (ref 1.7–7.7)
Neutrophils Relative %: 68 % (ref 43–77)
Platelets: 201 10*3/uL (ref 150–400)
RBC: 3.6 MIL/uL — ABNORMAL LOW (ref 3.87–5.11)
RDW: 12.6 % (ref 11.5–15.5)
WBC: 13.3 10*3/uL — ABNORMAL HIGH (ref 4.0–10.5)

## 2015-06-20 LAB — I-STAT CHEM 8, ED
BUN: 27 mg/dL — ABNORMAL HIGH (ref 6–20)
Calcium, Ion: 1.18 mmol/L (ref 1.13–1.30)
Chloride: 103 mmol/L (ref 101–111)
Creatinine, Ser: 1.2 mg/dL — ABNORMAL HIGH (ref 0.44–1.00)
Glucose, Bld: 89 mg/dL (ref 65–99)
HCT: 33 % — ABNORMAL LOW (ref 36.0–46.0)
Hemoglobin: 11.2 g/dL — ABNORMAL LOW (ref 12.0–15.0)
Potassium: 4.3 mmol/L (ref 3.5–5.1)
Sodium: 143 mmol/L (ref 135–145)
TCO2: 25 mmol/L (ref 0–100)

## 2015-06-20 LAB — URINE MICROSCOPIC-ADD ON

## 2015-06-20 LAB — I-STAT TROPONIN, ED: Troponin i, poc: 0.02 ng/mL (ref 0.00–0.08)

## 2015-06-20 LAB — CBG MONITORING, ED: Glucose-Capillary: 73 mg/dL (ref 65–99)

## 2015-06-20 MED ORDER — METOCLOPRAMIDE HCL 5 MG/ML IJ SOLN
10.0000 mg | Freq: Once | INTRAMUSCULAR | Status: AC
  Administered 2015-06-20: 10 mg via INTRAVENOUS
  Filled 2015-06-20: qty 2

## 2015-06-20 MED ORDER — HYDROMORPHONE HCL 1 MG/ML IJ SOLN
1.0000 mg | Freq: Once | INTRAMUSCULAR | Status: AC
  Administered 2015-06-20: 1 mg via INTRAVENOUS
  Filled 2015-06-20: qty 1

## 2015-06-20 MED ORDER — HYDRALAZINE HCL 20 MG/ML IJ SOLN
5.0000 mg | Freq: Once | INTRAMUSCULAR | Status: AC
  Administered 2015-06-20: 5 mg via INTRAVENOUS
  Filled 2015-06-20: qty 1

## 2015-06-20 MED ORDER — DIPHENHYDRAMINE HCL 50 MG/ML IJ SOLN
25.0000 mg | Freq: Once | INTRAMUSCULAR | Status: AC
  Administered 2015-06-20: 25 mg via INTRAVENOUS
  Filled 2015-06-20: qty 1

## 2015-06-20 MED ORDER — SODIUM CHLORIDE 0.9 % IV BOLUS (SEPSIS)
1000.0000 mL | Freq: Once | INTRAVENOUS | Status: AC
  Administered 2015-06-20: 1000 mL via INTRAVENOUS

## 2015-06-20 MED ORDER — ONDANSETRON HCL 4 MG/2ML IJ SOLN
4.0000 mg | Freq: Once | INTRAMUSCULAR | Status: AC
  Administered 2015-06-20: 4 mg via INTRAVENOUS
  Filled 2015-06-20: qty 2

## 2015-06-20 NOTE — H&P (Signed)
[facility] Admission History and Physical Service Pager: [phone]  Patient name: Kimberly Carlson Medical record number: [id number] Date of birth: 05-12-1946 Age: 69 y.o. Gender: female  Primary Care Provider: Junie Panning, DO Consultants: none Code Status: Full code (per discussion on admission)  Chief Complaint: headache, hypertension  Assessment and Plan: Kimberly Carlson is a 69 y.o. female presenting with headache and hypertensive urgency. PMH is significant for HTN, DM, osteoarthritis and back pain.   Headache likely 2/2 poorly controlled hypertension: Headache throbbing in nature, frontal and bilateral. Pt was recently admitted for SBP over 220 per her recent chart from ED visit. Today she was 704 systolic upon admission. She is on multiple medications but at low dose. Less likely to be CVA given no acute neurological findings (Head CT 7/11 negative). Infectious etiologies are less likely given no recent illness or fever & chills (no neck stiffness, meningeal signs, AMS). Acute glaucoma is another possibility but less likely given no recent vision changes (chronic blurry vision, R-eye). She does admit to photophobia, may need to rule out migraine.  Also considered less likely temporal arteritis (b/l HA, but temples non-tender and blurry vision chronic w/o vision loss, however age 48 and ESR 61) - Admit to Stepdown for close BP monitoring  - Continue home Carvedilol 3.125 mg bid and enalapril 10 mg daily - Norco 5/325 q4h prn moderate to severe pain - ASA 81 mg daily - Check UDS given h/o +cocaine on UDS - Consider MRI if HA does not resolve after controlling BP  Hypertension Urgency : Recently admitted for hypertensive urgency 7/13-7/14. Chronic uncontrolled HTN with lability.  sBP 217 on presentation, now s/p  IV Hydralazine 54m x1 in ED. Initial EKG non-ischemic and troponin neg x1.  No evidence of organ damage. - Continue home antihypertensive  meds as above - IV Hydralazine [id number] prn for SBP over 18888BVnd diastolic over [id number]- Given SOB, will complete ACS r/o with cycled troponins x3 and repeat EKG in AM  Asymptomatic Pyuria: UA with small Hgb, mod leuks, >300 protein.  Patient reports chronic urinary frequency, but no dysuria or other symptoms - No indication for treatment currently - f/u UCx  DM, Type 2: on lantus 25 units daily at home. A1c 6.1 in 01/2015. - lantus 15 units daily - sensitive SSI - check CBGs q AC + HS - repeat A1c   CKD Stage II-III, Stable. Likely secondary to chronic HTN / DM. SCr 1.2 on admission. B/l appears to be 1.0-1.2 - Continue to monitor  HLD: Last lipid panel 12/2011 with LDL 108 - Continue home atorvastatin 4064maily - FLP in AM  Osteoarthritis/back pain: Chronic condition, without active pain - Norco as above  Tobacco Abuse: a pack a day usually but has not had cigarettes in the last 2 weeks. Declines nicotine patch. - Encourage to not resume smoking   FEN/GI: heart healthy/carb modified diet Prophylaxis: subq heparin  Disposition: Observation, SDU, FPTS, attending [facility]/c pending improvement in BP and HA.  History of Present Illness: Kimberly Carlson a 69 13o. female presenting with headache and high blood pressure. She was admitted last week with the same problem. She reports that she was awoken with a severe HA this AM.  She tried extra strength tylenol, vicks, and a shower but it did not help to relieve her headache. She then decided to come to the ED. The HA has been ongoing intermittently for 5 weeks. She states the  HA is worse than it was before when she was in the hospital last week. She complains that her "ears are popping" and "bones are crushing". The pain starts in the frontal region of the head but is now bilateral. Admits to nausea and sensitivity to light, shortness of breath (once briefly when pain was so severe), swelling in legs (chronically), and  generalized fatigue. Denies any numbness, weakness, changes in vision, chest pain. She has been compliant with all her blood pressure medications.   Review Of Systems: Per HPI with the following additions: admits to increased urinary frequency Otherwise 12 point review of systems was performed and was unremarkable.  Patient Active Problem List   Diagnosis Date Noted  . Hematuria, undiagnosed cause 06/09/2015  . Headache 06/08/2015  . Hypertensive urgency 06/08/2015  . Hypertensive emergency 06/08/2015  . Cocaine use 06/08/2015  . Cephalalgia   . Noncompliance with medications   . Knee pain, bilateral 03/24/2014  . UTI (urinary tract infection) 05/27/2013  . Resting tremor 05/07/2012  . DIPLOPIA 11/28/2010  . DEGENERATIVE DISC DISEASE 11/09/2009  . TOBACCO USER 09/03/2009  . OBESITY 07/08/2009  . HERNIATED LUMBAR DISC 05/14/2007  . DM (diabetes mellitus), type 2, uncontrolled 01/23/2007  . HYPERCHOLESTEROLEMIA 01/23/2007  . HYPERTENSION, BENIGN SYSTEMIC 01/23/2007   Past Medical History: Past Medical History  Diagnosis Date  . Hypertension   . Diabetes mellitus without complication   . Back pain     herniated disc lower back   Past Surgical History: Past Surgical History  Procedure Laterality Date  . Abdominal hysterectomy     Social History: History  Substance Use Topics  . Smoking status: Current Every Day Smoker -- 1.00 packs/day    Types: Cigarettes    Start date: 04/26/2014  . Smokeless tobacco: Never Used     Comment: Started in her 68's with multiple quit attempts for about 1 year at a time.  . Alcohol Use: No   Additional social history: states she quit smoking 2 weeks ago. No alcohol or illicit drugs.  Please also refer to relevant sections of EMR.  Family History: Family History  Problem Relation Age of Onset  . Adopted: Yes   Allergies and Medications: Allergies  Allergen Reactions  . Metformin Diarrhea   No current facility-administered  medications on file prior to encounter.   Current Outpatient Prescriptions on File Prior to Encounter  Medication Sig Dispense Refill  . amLODipine (NORVASC) 5 MG tablet Take 1 tablet (5 mg total) by mouth daily. 30 tablet 1  . aspirin (ASPIRIN CHILDRENS) 81 MG chewable tablet Chew 81 mg by mouth daily.      Marland Kitchen atorvastatin (LIPITOR) 40 MG tablet Take 1 tablet (40 mg total) by mouth daily. 90 tablet 3  . carvedilol (COREG) 3.125 MG tablet Take 1 tablet (3.125 mg total) by mouth 2 (two) times daily with a meal. 60 tablet 2  . enalapril (VASOTEC) 10 MG tablet Take 1 tablet (10 mg total) by mouth every evening. 90 tablet 3  . LANTUS 100 UNIT/ML injection INJECT 25 UNITS DAILY (Patient taking differently: INJECT 25 UNITS DAILY IN MORNING) 10 mL 2  . Insulin Syringe-Needle U-100 (INSULIN SYRINGE 1CC/31GX5/16") 31G X 5/16" 1 ML MISC 1 each by Does not apply route See admin instructions. Use daily with insulin. 100 each 11  . ONE TOUCH LANCETS MISC 1 each by Does not apply route See admin instructions. Check blood sugar once daily. 200 each 11  . [DISCONTINUED] simvastatin (ZOCOR) 80 MG tablet  take 1 tablet by mouth at bedtime 90 tablet 0    Objective: BP 180/63 mmHg  Pulse 78  Temp(Src) 98.4 F (36.9 C) (Oral)  Resp 16  SpO2 99% Exam: Gen: In NAD. Alert, cooperative with exam. Well-appearing, comfortable HEENT: [place] Atraumatic, sinuses / temples & scalp non-tender, EOMI, PERRLA, Oropharynx clear. MMM, TMs clear b/l CV: RRR. No murmurs, rubs, or gallops noted. 2+ radial and DP pulses bilaterally. Resp: CTAB. No wheezing, crackles, or rhonchi noted. Good air movement. Non-labored. Abd: +BS. Soft, non-distended, non-tender. No rebound or guarding.  Ext: +1 edema bilateral ankles. Non-tender. No gross deformities. Neuro: AAOx3, CN II-XII intact, motor 5/5 grip, biceps flex, knee flex, ankle dorsiflex, sensation intact to light touch in all ext.  Labs and Imaging: CBC BMET   Recent Labs Lab  06/20/15 2017 06/20/15 2025  WBC 13.3*  --   HGB 10.8* 11.2*  HCT 32.3* 33.0*  PLT 201  --     Recent Labs Lab 06/20/15 2025  NA 143  K 4.3  CL 103  BUN 27*  CREATININE 1.20*  GLUCOSE 89     Drugs of Abuse     Component Value Date/Time   LABOPIA NONE DETECTED 06/08/2015 2216   COCAINSCRNUR NONE DETECTED 06/08/2015 2216   COCAINSCRNUR NEG 02/17/2015 1507   LABBENZ NONE DETECTED 06/08/2015 2216   LABBENZ NEG 02/17/2015 1507   AMPHETMU NONE DETECTED 06/08/2015 2216   AMPHETMU NEG 02/17/2015 1507   THCU NONE DETECTED 06/08/2015 2216   LABBARB NONE DETECTED 06/08/2015 2216     Urinalysis    Component Value Date/Time   COLORURINE YELLOW 06/20/2015 2227   APPEARANCEUR CLEAR 06/20/2015 2227   LABSPEC 1.007 06/20/2015 2227   PHURINE 7.0 06/20/2015 2227   GLUCOSEU NEGATIVE 06/20/2015 2227   HGBUR SMALL* 06/20/2015 2227   BILIRUBINUR NEGATIVE 06/20/2015 2227   BILIRUBINUR NEG 02/17/2015 1413   KETONESUR NEGATIVE 06/20/2015 2227   PROTEINUR >300* 06/20/2015 2227   PROTEINUR 100 02/17/2015 1413   UROBILINOGEN 0.2 06/20/2015 2227   UROBILINOGEN 0.2 02/17/2015 1413   NITRITE NEGATIVE 06/20/2015 2227   NITRITE NEG 02/17/2015 1413   LEUKOCYTESUR MODERATE* 06/20/2015 2227   Troponin 0.02, <0.03  Carlyle Dolly, MD 06/20/2015, 11:45 PM PGY-1, [facility] pager: [phone], text pages welcome  Upper Level Addendum:  I have seen and evaluated this patient along with Dr. Juanito Doom and reviewed the above note, making necessary revisions in pink.  Virginia Crews, MD, MPH PGY-2,  [facility] 06/21/2015 4:47 AM

## 2015-06-20 NOTE — ED Provider Notes (Signed)
CSN: [id number]     Arrival date & time 06/20/15  1504 History   First MD Initiated Contact with Patient 06/20/15 1900     Chief Complaint  Patient presents with  . Headache    The paitent said she was here last week for the same thing,  She says she had HBP last time so theyh admitted her and discharged her the next day.     (Consider location/radiation/quality/duration/timing/severity/associated sxs/prior Treatment) HPI   69 year old female with history of insulin-dependent diabetes, hypertension, back pain, recurrent headache presenting for evaluation of headache. Patient report she developed severe headache and high blood pressure approximately 2 weeks ago in which she was admitted to the hospital. She has a normal head CT at that time. Headache was felt to be related to her high blood pressure. She was subsequently discharge. Today she woke up from sleep with a severe headache. She described headaches as a generalized throbbing sensation to both temples, worsening with light and sound. Report nasal congestion, and sinus congestion. Endorse nausea without vomiting. Headaches waxing waning. Reports spots in her vision yesterday. Complaining of neck pain, occasional shortness of breath when pain is intense.  Sts headache is worse than when she was seen 2 weeks ago. Report taking all of her BP medication as prescribed.  Denies fever, chills, neck stiffness, cp, sob, abd pain, n/v/d, focal numbness, weakness or rash.  Does endores mild light sensitivity.  No prior hx of stroke.      Past Medical History  Diagnosis Date  . Hypertension   . Diabetes mellitus without complication   . Back pain     herniated disc lower back   Past Surgical History  Procedure Laterality Date  . Abdominal hysterectomy     Family History  Problem Relation Age of Onset  . Adopted: Yes   History  Substance Use Topics  . Smoking status: Current Every Day Smoker -- 1.00 packs/day    Types: Cigarettes   Start date: 04/26/2014  . Smokeless tobacco: Never Used     Comment: Started in her 66's with multiple quit attempts for about 1 year at a time.  . Alcohol Use: No   OB History    No data available     Review of Systems  All other systems reviewed and are negative.     Allergies  Metformin  Home Medications   Prior to Admission medications   Medication Sig Start Date End Date Taking? Authorizing Provider  amLODipine (NORVASC) 5 MG tablet Take 1 tablet (5 mg total) by mouth daily. 06/06/15   Carlisle Cater, PA-C  aspirin (ASPIRIN CHILDRENS) 81 MG chewable tablet Chew 81 mg by mouth daily.      Historical Provider, MD  atorvastatin (LIPITOR) 40 MG tablet Take 1 tablet (40 mg total) by mouth daily. 03/10/15   Zenia Resides, MD  Blood Glucose Monitoring Suppl (ONE TOUCH ULTRA SYSTEM KIT) W/DEVICE KIT 1 kit by Does not apply route See admin instructions. Use to check blood sugar once daily.    Historical Provider, MD  carvedilol (COREG) 3.125 MG tablet Take 1 tablet (3.125 mg total) by mouth 2 (two) times daily with a meal. 06/13/15   Olam Idler, MD  enalapril (VASOTEC) 10 MG tablet Take 1 tablet (10 mg total) by mouth every evening. 03/08/15   Zenia Resides, MD  glucose blood (ONE TOUCH TEST STRIPS) test strip 1 each by Other route See admin instructions. Check blood sugar once daily.  Historical Provider, MD  Insulin Syringe-Needle U-100 (INSULIN SYRINGE 1CC/31GX5/16") 31G X 5/16" 1 ML MISC 1 each by Does not apply route See admin instructions. Use daily with insulin. 03/07/15   [place] N Rum[place], DO  LANTUS 100 UNIT/ML injection INJECT 25 UNITS DAILY 05/31/15   [place] N Rumley, DO  ONE TOUCH LANCETS MISC 1 each by Does not apply route See admin instructions. Check blood sugar once daily. 02/21/15   Olam Idler, MD   BP 180/87 mmHg  Pulse 81  Temp(Src) 98.4 F (36.9 C) (Oral)  Resp 18  SpO2 99% Physical Exam  Constitutional: She appears well-developed and well-nourished. No  distress.  HENT:  Head: Normocephalic and atraumatic.  Right Ear: External ear normal.  Left Ear: External ear normal.  Eyes: Conjunctivae and EOM are normal. Pupils are equal, round, and reactive to light.  Neck: Normal range of motion. Neck supple.  No nuchal rigidity  Neurological: She is alert.  Neurologic exam:  Speech clear, pupils equal round reactive to light, extraocular movements intact  Normal peripheral visual fields Cranial nerves III through XII normal including no facial droop Follows commands, moves all extremities x4, normal strength to bilateral upper and lower extremities at all major muscle groups including grip Sensation normal to light touch  Coordination intact, no limb ataxia, finger-nose-finger normal Rapid alternating movements normal No pronator drift Gait normal   Skin: No rash noted.  Psychiatric: She has a normal mood and affect.  Nursing note and vitals reviewed.   ED Course  Procedures (including critical care time)  8:13 PM Patient here with recurrent headache at least twice a week. Worsening headache today. Was admitted to the hospital 2 weeks ago for similar headache when she was found to have blood pressure systolic greater than 657. No evidence of intracranial hemorrhage or acute finding on his CT scan. She was subsequently discharge. Per prior note, may consider brain MRI for further evaluation. At this time patient has no focal neuro deficit mentating appropriately. Headache appears to be sinus headache. We'll provide migraine cocktail and monitor closely.  Initial BP today is 217/82.  Hydralazine given.    9:39 PM Blood pressure continued to rise and currently at 846 systolic. Minimal improvement of her headache after receiving migraine cocktail. Patient will receive 5 mg of hydralazine, along with 1 mg Dilaudid pain medication. Will monitor closely. Care discussed with Dr. Aline Brochure.   11:51 PM Headache is 5/10, not fully resolved.  I have  consulted with [facility] resident who agrees to see pt in ER and will admit for further management.  UA showing >300 protein, her Creatinine 1.2.  Finding concerning for possible hypertensive emergency.  She may benefit from MRI/MRA.  UA with 7/10 WBC, and moderate leukocytes.  Pt however without dysuria.  No abx given in ER.    I'm documenting critical care because pt has a systolic BP of 962 requiring hydralazine IV push, and also exhibiting evidence of end organ injury (UA showing >300 protein, Cr. 1.2).  Pt requiring admission to step down.    CRITICAL CARE Performed by: Domenic Moras Total critical care time: 45 min Critical care time was exclusive of separately billable procedures and treating other patients. Critical care was necessary to treat or prevent imminent or life-threatening deterioration. Critical care was time spent personally by me on the following activities: development of treatment plan with patient and/or surrogate as well as nursing, discussions with consultants, evaluation of patient's response to treatment, examination of patient, obtaining history  from patient or surrogate, ordering and performing treatments and interventions, ordering and review of laboratory studies, ordering and review of radiographic studies, pulse oximetry and re-evaluation of patient's condition.   Labs Review Labs Reviewed  CBC WITH DIFFERENTIAL/PLATELET - Abnormal; Notable for the following:    WBC 13.3 (*)    RBC 3.60 (*)    Hemoglobin 10.8 (*)    HCT 32.3 (*)    Neutro Abs 9.1 (*)    All other components within normal limits  URINALYSIS, ROUTINE W REFLEX MICROSCOPIC (NOT AT [facility]) - Abnormal; Notable for the following:    Hgb urine dipstick SMALL (*)    Protein, ur >300 (*)    Leukocytes, UA MODERATE (*)    All other components within normal limits  I-STAT CHEM 8, ED - Abnormal; Notable for the following:    BUN 27 (*)    Creatinine, Ser 1.20 (*)    Hemoglobin 11.2 (*)    HCT 33.0 (*)    All  other components within normal limits  URINE MICROSCOPIC-ADD ON  CBG MONITORING, ED  I-STAT TROPOININ, ED    Imaging Review No results found.   EKG Interpretation   Date/Time:  Tuesday June 21 2015 00:06:41 EDT Ventricular Rate:  84 PR Interval:  180 QRS Duration: 88 QT Interval:  410 QTC Calculation: 485 R Axis:   60 Text Interpretation:  Sinus rhythm Repol abnrm suggests ischemia, lateral  leads new t wave reversal I Confirmed by OTTER  MD, OLGA ([id number]) on  06/21/2015 12:10:35 AM      MDM   Final diagnoses:  Hypertensive urgency    BP 180/63 mmHg  Pulse 78  Temp(Src) 98.4 F (36.9 C) (Oral)  Resp 16  SpO2 99%  I have reviewed nursing notes and vital signs. I reviewed available ER/hospitalization records through the EMR      Domenic Moras, PA-C 06/21/15 0014  Pamella Pert, MD 06/21/15 Laureen Abrahams

## 2015-06-20 NOTE — ED Notes (Signed)
The paitent said she was here last week for the same thing,  She says she had HBP last time so theyh admitted her and discharged her the next day.  The patient said her pain is so severe it woke her up this morning.  She rates her pain 10/10.  She does have nausea but no vomiting.

## 2015-06-21 DIAGNOSIS — R51 Headache: Secondary | ICD-10-CM | POA: Diagnosis not present

## 2015-06-21 DIAGNOSIS — G4452 New daily persistent headache (NDPH): Secondary | ICD-10-CM

## 2015-06-21 DIAGNOSIS — E119 Type 2 diabetes mellitus without complications: Secondary | ICD-10-CM | POA: Diagnosis not present

## 2015-06-21 DIAGNOSIS — E780 Pure hypercholesterolemia: Secondary | ICD-10-CM

## 2015-06-21 DIAGNOSIS — E785 Hyperlipidemia, unspecified: Secondary | ICD-10-CM | POA: Diagnosis not present

## 2015-06-21 DIAGNOSIS — I10 Essential (primary) hypertension: Secondary | ICD-10-CM

## 2015-06-21 DIAGNOSIS — Z7982 Long term (current) use of aspirin: Secondary | ICD-10-CM | POA: Diagnosis not present

## 2015-06-21 LAB — CBC
HCT: 31.4 % — ABNORMAL LOW (ref 36.0–46.0)
Hemoglobin: 10.5 g/dL — ABNORMAL LOW (ref 12.0–15.0)
MCH: 30 pg (ref 26.0–34.0)
MCHC: 33.4 g/dL (ref 30.0–36.0)
MCV: 89.7 fL (ref 78.0–100.0)
Platelets: 206 10*3/uL (ref 150–400)
RBC: 3.5 MIL/uL — ABNORMAL LOW (ref 3.87–5.11)
RDW: 12.9 % (ref 11.5–15.5)
WBC: 13.5 10*3/uL — ABNORMAL HIGH (ref 4.0–10.5)

## 2015-06-21 LAB — RAPID URINE DRUG SCREEN, HOSP PERFORMED
Amphetamines: NOT DETECTED
Barbiturates: NOT DETECTED
Benzodiazepines: NOT DETECTED
Cocaine: NOT DETECTED
Opiates: NOT DETECTED
Tetrahydrocannabinol: NOT DETECTED

## 2015-06-21 LAB — BASIC METABOLIC PANEL
Anion gap: 8 (ref 5–15)
BUN: 24 mg/dL — ABNORMAL HIGH (ref 6–20)
CO2: 26 mmol/L (ref 22–32)
Calcium: 8.8 mg/dL — ABNORMAL LOW (ref 8.9–10.3)
Chloride: 107 mmol/L (ref 101–111)
Creatinine, Ser: 1.19 mg/dL — ABNORMAL HIGH (ref 0.44–1.00)
GFR calc Af Amer: 52 mL/min — ABNORMAL LOW (ref >60)
GFR calc non Af Amer: 45 mL/min — ABNORMAL LOW (ref >60)
Glucose, Bld: 168 mg/dL — ABNORMAL HIGH (ref 65–99)
Potassium: 3.9 mmol/L (ref 3.5–5.1)
Sodium: 141 mmol/L (ref 135–145)

## 2015-06-21 LAB — GLUCOSE, CAPILLARY
Glucose-Capillary: 178 mg/dL — ABNORMAL HIGH (ref 65–99)
Glucose-Capillary: 157 mg/dL — ABNORMAL HIGH (ref 65–99)
Glucose-Capillary: 159 mg/dL — ABNORMAL HIGH (ref 65–99)
Glucose-Capillary: 185 mg/dL — ABNORMAL HIGH (ref 65–99)
Glucose-Capillary: 141 mg/dL — ABNORMAL HIGH (ref 65–99)

## 2015-06-21 LAB — LIPID PANEL
Cholesterol: 216 mg/dL — ABNORMAL HIGH (ref 0–200)
HDL: 86 mg/dL (ref >40)
LDL Cholesterol: 120 mg/dL — ABNORMAL HIGH (ref 0–99)
Total CHOL/HDL Ratio: 2.5 RATIO
Triglycerides: 51 mg/dL (ref <150)
VLDL: 10 mg/dL (ref 0–40)

## 2015-06-21 LAB — MRSA PCR SCREENING: MRSA by PCR: NEGATIVE

## 2015-06-21 LAB — TROPONIN I
Troponin I: <0.03 ng/mL (ref <0.031)
Troponin I: <0.03 ng/mL (ref <0.031)
Troponin I: 0.03 ng/mL (ref <0.031)

## 2015-06-21 MED ORDER — HEPARIN SODIUM (PORCINE) 5000 UNIT/ML IJ SOLN
5000.0000 [IU] | Freq: Three times a day (TID) | INTRAMUSCULAR | Status: DC
  Administered 2015-06-21 – 2015-06-22 (×3): 5000 [IU] via SUBCUTANEOUS
  Filled 2015-06-21 (×6): qty 1

## 2015-06-21 MED ORDER — AMLODIPINE BESYLATE 5 MG PO TABS
5.0000 mg | ORAL_TABLET | Freq: Once | ORAL | Status: AC
  Administered 2015-06-21: 5 mg via ORAL
  Filled 2015-06-21: qty 1

## 2015-06-21 MED ORDER — INSULIN ASPART 100 UNIT/ML ~~LOC~~ SOLN
0.0000 [IU] | Freq: Three times a day (TID) | SUBCUTANEOUS | Status: DC
  Administered 2015-06-21 (×3): 2 [IU] via SUBCUTANEOUS
  Administered 2015-06-22 (×2): 1 [IU] via SUBCUTANEOUS

## 2015-06-21 MED ORDER — HYDRALAZINE HCL 20 MG/ML IJ SOLN
5.0000 mg | INTRAMUSCULAR | Status: DC | PRN
  Administered 2015-06-21: 5 mg via INTRAVENOUS
  Filled 2015-06-21: qty 1

## 2015-06-21 MED ORDER — HYDROCODONE-ACETAMINOPHEN 5-325 MG PO TABS
1.0000 | ORAL_TABLET | ORAL | Status: DC | PRN
  Administered 2015-06-21 – 2015-06-22 (×3): 1 via ORAL
  Filled 2015-06-21 (×3): qty 1

## 2015-06-21 MED ORDER — CARVEDILOL 3.125 MG PO TABS
3.1250 mg | ORAL_TABLET | Freq: Two times a day (BID) | ORAL | Status: DC
  Administered 2015-06-21 – 2015-06-22 (×4): 3.125 mg via ORAL
  Filled 2015-06-21 (×6): qty 1

## 2015-06-21 MED ORDER — ENALAPRIL MALEATE 10 MG PO TABS
10.0000 mg | ORAL_TABLET | Freq: Every evening | ORAL | Status: DC
  Administered 2015-06-21: 10 mg via ORAL
  Filled 2015-06-21 (×2): qty 1

## 2015-06-21 MED ORDER — LORATADINE 10 MG PO TABS
10.0000 mg | ORAL_TABLET | Freq: Every day | ORAL | Status: DC | PRN
  Filled 2015-06-21: qty 1

## 2015-06-21 MED ORDER — SODIUM CHLORIDE 0.9 % IJ SOLN: 3.0000 mL | Freq: Two times a day (BID) | INTRAMUSCULAR | Status: DC

## 2015-06-21 MED ORDER — ONDANSETRON HCL 4 MG PO TABS: 4.0000 mg | ORAL_TABLET | Freq: Four times a day (QID) | ORAL | Status: DC | PRN

## 2015-06-21 MED ORDER — ASPIRIN 81 MG PO CHEW
81.0000 mg | CHEWABLE_TABLET | Freq: Every day | ORAL | Status: DC
  Administered 2015-06-21 – 2015-06-22 (×2): 81 mg via ORAL
  Filled 2015-06-21 (×2): qty 1

## 2015-06-21 MED ORDER — SODIUM CHLORIDE 0.9 % IJ SOLN: 3.0000 mL | INTRAMUSCULAR | Status: DC | PRN

## 2015-06-21 MED ORDER — AMLODIPINE BESYLATE 5 MG PO TABS
5.0000 mg | ORAL_TABLET | Freq: Every day | ORAL | Status: DC
  Administered 2015-06-21: 5 mg via ORAL
  Filled 2015-06-21: qty 1

## 2015-06-21 MED ORDER — ATORVASTATIN CALCIUM 40 MG PO TABS
40.0000 mg | ORAL_TABLET | Freq: Every day | ORAL | Status: DC
  Administered 2015-06-21 – 2015-06-22 (×2): 40 mg via ORAL
  Filled 2015-06-21 (×2): qty 1

## 2015-06-21 MED ORDER — SODIUM CHLORIDE 0.9 % IV SOLN: 250.0000 mL | INTRAVENOUS | Status: DC | PRN

## 2015-06-21 MED ORDER — INSULIN GLARGINE 100 UNIT/ML ~~LOC~~ SOLN
15.0000 [IU] | Freq: Every day | SUBCUTANEOUS | Status: DC
  Administered 2015-06-21 – 2015-06-22 (×2): 15 [IU] via SUBCUTANEOUS
  Filled 2015-06-21 (×2): qty 0.15

## 2015-06-21 MED ORDER — AMLODIPINE BESYLATE 10 MG PO TABS
10.0000 mg | ORAL_TABLET | Freq: Every day | ORAL | Status: DC
  Administered 2015-06-22: 10 mg via ORAL
  Filled 2015-06-21: qty 1

## 2015-06-21 MED ORDER — SODIUM CHLORIDE 0.9 % IJ SOLN
3.0000 mL | Freq: Two times a day (BID) | INTRAMUSCULAR | Status: DC
  Administered 2015-06-21 (×3): 3 mL via INTRAVENOUS

## 2015-06-21 MED ORDER — ONDANSETRON HCL 4 MG/2ML IJ SOLN: 4.0000 mg | Freq: Four times a day (QID) | INTRAMUSCULAR | Status: DC | PRN

## 2015-06-21 NOTE — Consult Note (Signed)
[place] spoke with patient regarding her family leaving 77 yr. old grandchild in room for over 4 hours; pt indicated that she has called daughter to come a pick child up; [place] made pt aware that child should not be left in her care while she in the hospital as a pt.

## 2015-06-21 NOTE — Discharge Summary (Signed)
[facility] Discharge Summary  Patient name: Kimberly Carlson Medical record number: [id number] Date of birth: [birth year] Age: 69 y.o. Gender: female Date of Admission: 06/20/2015  Date of Discharge: 06/22/15 Admitting Physician: Alveda Reasons, MD  Primary Care Provider: Phill Myron, MD Consultants: None  Indication for Hospitalization: hypertensive urgency, headache  Discharge Diagnoses/Problem List:  Patient Active Problem List   Diagnosis Date Noted  . Hematuria, undiagnosed cause 06/09/2015  . Headache 06/08/2015  . Hypertensive urgency 06/08/2015  . Hypertensive emergency 06/08/2015  . Cocaine use 06/08/2015  . Cephalalgia   . Noncompliance with medications   . Knee pain, bilateral 03/24/2014  . UTI (urinary tract infection) 05/27/2013  . Resting tremor 05/07/2012  . DIPLOPIA 11/28/2010  . DEGENERATIVE DISC DISEASE 11/09/2009  . TOBACCO USER 09/03/2009  . Obesity 07/08/2009  . HERNIATED LUMBAR DISC 05/14/2007  . DM (diabetes mellitus), type 2, uncontrolled 01/23/2007  . HYPERCHOLESTEROLEMIA 01/23/2007  . HYPERTENSION, BENIGN SYSTEMIC 01/23/2007     Disposition: Home  Discharge Condition: Stable  Discharge Exam:  Gen: In NAD. Alert, cooperative with exam. Well-appearing, comfortable CV: RRR. No murmurs, rubs, or gallops noted. 2+ radial and DP pulses bilaterally. Resp: CTAB. No wheezing, crackles, or rhonchi noted. Good air movement. Non-labored. Abd: +BS. Soft, non-distended, non-tender. No rebound or guarding.  Neuro: AAOx3, no gross deficits Psych: appropriate mood and affect   Brief Hospital Course:  [name] presented with severe HA. She was found to have systolic BP of [id number] on admission. Aggressive BP control was started, and her HA subsequently improved. Over the rest of her admission, her home BP meds were adjusted to maintain appropriate blood pressures.  She endorsed increased urinary frequency throughout admission.  Her UA was suspicious for UTI, so she was started with Keflex.  After observation on her updated medication dosages, she was discharged.   Issues for Follow Up:  1. Patient's updated BP meds are: amlodipine 20 mg, carvedilol 3.125 BID, enalapril 20 mg. 2. Patient was unintentionally discharged on her pre-admission dose of enalapril 10 mg. I called the patient after discharge to inform her that she should be taking 20 mg rather than 10 mg. She voiced understanding.  3. Given second episode of hypertensive urgency in two weeks, question patient's medication compliance.  4. Patient reports that she stopped smoking 4 days before admission. Please encourage patient not to resume smoking.  5. Patient needs repeat BMP at follow-up appointment given change in enalapril.   Significant Procedures: None  Significant Labs and Imaging: None  Recent Labs Lab 06/20/15 2017 06/20/15 2025 06/21/15 0657  WBC 13.3*  --  13.5*  HGB 10.8* 11.2* 10.5*  HCT 32.3* 33.0* 31.4*  PLT 201  --  206    Recent Labs Lab 06/20/15 2025 06/21/15 0657  NA 143 141  K 4.3 3.9  CL 103 107  CO2  --  26  GLUCOSE 89 168*  BUN 27* 24*  CREATININE 1.20* 1.19*  CALCIUM  --  8.8*    Results/Tests Pending at Time of Discharge: None  Discharge Medications:    Medication List    TAKE these medications        acetaminophen 500 MG tablet  Commonly known as:  TYLENOL  Take 1,000 mg by mouth every 6 (six) hours as needed for moderate pain.     amLODipine 10 MG tablet  Commonly known as:  NORVASC  Take 1 tablet (10 mg total) by mouth daily.     ASPIRIN  CHILDRENS 81 MG chewable tablet  Generic drug:  aspirin  Chew 81 mg by mouth daily.     atorvastatin 40 MG tablet  Commonly known as:  LIPITOR  Take 1 tablet (40 mg total) by mouth daily.     carvedilol 3.125 MG tablet  Commonly known as:  COREG  Take 1 tablet (3.125 mg total) by mouth 2 (two) times daily with a meal.     cephALEXin 500 MG capsule   Commonly known as:  KEFLEX  Take 1 capsule (500 mg total) by mouth every 6 (six) hours.     enalapril 10 MG tablet  Commonly known as:  VASOTEC  Take 2 tablets (20 mg total) by mouth every morning.     ibuprofen 200 MG tablet  Commonly known as:  ADVIL,MOTRIN  Take 400 mg by mouth every 6 (six) hours as needed for moderate pain.     INSULIN SYRINGE 1CC/31GX5/16" 31G X 5/16" 1 ML Misc  1 each by Does not apply route See admin instructions. Use daily with insulin.     LANTUS 100 UNIT/ML injection  Generic drug:  insulin glargine  INJECT 25 UNITS DAILY     loratadine 10 MG tablet  Commonly known as:  CLARITIN  Take 10 mg by mouth daily as needed for allergies.     ONE TOUCH LANCETS Misc  1 each by Does not apply route See admin instructions. Check blood sugar once daily.        Discharge Instructions: Please refer to Patient Instructions section of EMR for full details.  Patient was counseled important signs and symptoms that should prompt return to medical care, changes in medications, dietary instructions, activity restrictions, and follow up appointments.   Follow-up Information    Follow up with Phill Myron, MD On 07/01/2015.   Specialty:  Family Medicine   Why:  Appointment scheduled for 8:45am   Contact information:   [place] [address] [phone]       Verner Mould, MD 06/26/2015, 9:24 AM PGY-1, [place]

## 2015-06-21 NOTE — Progress Notes (Signed)
Family Medicine Teaching Service Daily Progress Note Intern Pager: [phone]  Patient name: Kimberly Carlson Medical record number: [id number] Date of birth: [birth year] Age: 69 y.o. Gender: female  Primary Care Provider: Junie Panning, DO Consultants: none Code Status: Full code (per discussion on admission)  Chief Complaint: headache, hypertension  Assessment and Plan: Kimberly Carlson is a 69 y.o. female presenting with headache and hypertensive urgency. PMH is significant for HTN, DM, osteoarthritis and back pain.   Headache likely 2/2 poorly controlled hypertension: Headache throbbing in nature, frontal and bilateral. Pt was recently admitted for SBP over 570. 177 systolic upon this admission. She is on multiple medications but at low dose. Less likely to be CVA given no acute neurological findings (Head CT 7/11 negative). Infectious etiologies are less likely given no recent illness or fever & chills (no neck stiffness, meningeal signs, AMS). Acute glaucoma is another possibility but less likely given no recent vision changes (chronic blurry vision, R-eye). Also considered less likely temporal arteritis (b/l HA, but temples non-tender and blurry vision chronic w/o vision loss, however age 13 and ESR 73).   - Norco 5/325 q4h prn moderate to severe pain - ASA 81 mg daily - Consider MRI if HA does not resolve after controlling BP - Amlodipine 10 mg, carvedilol 3.125 BID, enalapril 10 mg  - Consider d/c today given stable BPs  Hypertension Urgency : Recently admitted for hypertensive urgency 7/13-7/14. Chronic uncontrolled HTN with lability. sBP 217 on presentation, now s/p IV Hydralazine 66m x1 in ED. Initial EKG non-ischemic and troponin neg x1. No evidence of organ damage.  - Repeat EKG (07/26) - NSR with improved T and ST changes from EKG on admission.  - Continue home antihypertensive meds as above - IV Hydralazine [id number] prn for SBP over [id number] diastolic over [id number]-  Troponins negative x3  Asymptomatic Pyuria: UA with small Hgb, mod leuks, >300 protein. Patient reports chronic urinary frequency, but no dysuria or other symptoms - No indication for treatment currently - Ucx still pending   DM, Type 2: on lantus 25 units daily at home. A1c 6.1 in 01/2015. - Lantus 15 units daily - Sensitive SSI - Check CBGs q AC + HS - Repeat A1C improved from prior at 5.8   CKD Stage II-III, Stable. Likely secondary to chronic HTN / DM. SCr 1.2 on admission. B/l appears to be 1.0-1.2 - Continue to monitor  HLD: Last lipid panel 12/2011 with LDL 108 - Continue home atorvastatin 4043maily  Osteoarthritis/back pain: Chronic condition, without active pain - Norco as above  Tobacco Abuse: a pack a day usually but has not had cigarettes in the last 2 weeks. Declines nicotine patch. - Encourage to not resume smoking   FEN/GI: heart healthy/carb modified diet Prophylaxis: subq heparin  Disposition: home pending medical improvement   Subjective:  Patient reports persistent 3/10 HA unchanged from when I saw her yesterday morning. She denies changes in vision or other changes.  BPs ~150/60 overnight. Consider discharging today.   Objective: Temp:  [98.5 F (36.9 C)-99.4 F (37.4 C)] 98.5 F (36.9 C) (07/27 0531) Pulse Rate:  [72-94] 72 (07/27 0819) Resp:  [15-18] 17 (07/27 0531) BP: (133-171)/(50-66) 160/66 mmHg (07/27 0819) SpO2:  [96 %-99 %] 96 % (07/27 0531) Weight:  [196 lb 13.9 oz (89.3 kg)] 196 lb 13.9 oz (89.3 kg) (07/26 1805) Physical Exam: Gen: In NAD. Alert, cooperative with exam. Well-appearing, comfortable CV: RRR. No murmurs, rubs, or gallops noted. 2+ radial  and DP pulses bilaterally. Resp: CTAB. No wheezing, crackles, or rhonchi noted. Good air movement. Non-labored. Abd: +BS. Soft, non-distended, non-tender. No rebound or guarding.  Neuro: AAOx3, no gross deficits Psych: appropriate mood and affect   Laboratory:  Recent Labs Lab  06/20/15 2017 06/20/15 2025 06/21/15 0657  WBC 13.3*  --  13.5*  HGB 10.8* 11.2* 10.5*  HCT 32.3* 33.0* 31.4*  PLT 201  --  206    Recent Labs Lab 06/20/15 2025 06/21/15 0657  NA 143 141  K 4.3 3.9  CL 103 107  CO2  --  26  BUN 27* 24*  CREATININE 1.20* 1.19*  CALCIUM  --  8.8*  GLUCOSE 89 168*    Imaging/Diagnostic Tests: No results found.   Verner Mould, MD 06/22/2015, 9:05 AM PGY-1, [place] Intern pager: [phone], text pages welcome

## 2015-06-22 LAB — HEMOGLOBIN A1C
Hgb A1c MFr Bld: 5.8 % — ABNORMAL HIGH (ref 4.8–5.6)
Mean Plasma Glucose: 120 mg/dL

## 2015-06-22 LAB — GLUCOSE, CAPILLARY
Glucose-Capillary: 129 mg/dL — ABNORMAL HIGH (ref 65–99)
Glucose-Capillary: 135 mg/dL — ABNORMAL HIGH (ref 65–99)
Glucose-Capillary: 98 mg/dL (ref 65–99)

## 2015-06-22 LAB — URINE CULTURE

## 2015-06-22 MED ORDER — CEPHALEXIN 500 MG PO CAPS
500.0000 mg | ORAL_CAPSULE | Freq: Four times a day (QID) | ORAL | Status: DC
  Administered 2015-06-22: 500 mg via ORAL
  Filled 2015-06-22: qty 1

## 2015-06-22 MED ORDER — CEPHALEXIN 500 MG PO CAPS: 500.0000 mg | ORAL_CAPSULE | Freq: Four times a day (QID) | ORAL | Status: DC

## 2015-06-22 MED ORDER — ENALAPRIL MALEATE 10 MG PO TABS
10.0000 mg | ORAL_TABLET | Freq: Once | ORAL | Status: AC
  Administered 2015-06-22: 10 mg via ORAL
  Filled 2015-06-22: qty 1

## 2015-06-22 MED ORDER — ENALAPRIL MALEATE 20 MG PO TABS: 20.0000 mg | ORAL_TABLET | Freq: Every day | ORAL | Status: DC

## 2015-06-22 MED ORDER — AMLODIPINE BESYLATE 10 MG PO TABS: 10.0000 mg | ORAL_TABLET | Freq: Every day | ORAL | Status: DC

## 2015-06-22 MED ORDER — ENALAPRIL MALEATE 10 MG PO TABS: 10.0000 mg | ORAL_TABLET | ORAL | Status: DC

## 2015-06-22 NOTE — Care Management Note (Signed)
Case Management Note  Patient Details  Name: Kimberly Carlson MRN: [id number] Date of Birth: 09-08-1946  Subjective/Objective:                 Patient from home, llives with children. Patient admitted for HTN crisis, symptomatic for headaches. No home services, or therapies identifies at this time. .    Action/Plan:  Patient to discharge to home in care of family.  Expected Discharge Date:                  Expected Discharge Plan:  Home/Self Care  In-House Referral:     Discharge planning Services  CM Consult  Post Acute Care Choice:    Choice offered to:     DME Arranged:    DME Agency:     HH Arranged:    [place] Agency:     Status of Service:  Completed, signed off  Medicare Important Message Given:    Date Medicare IM Given:    Medicare IM give by:    Date Additional Medicare IM Given:    Additional Medicare Important Message give by:     If discussed at [place] of Stay Meetings, dates discussed:    Additional Comments:  Carles Collet, RN 06/22/2015, 2:31 PM

## 2015-06-22 NOTE — Progress Notes (Signed)
Reviewed discharge instructions with pt.  PIV removed.  Pt denied any needs at this time.  Awaiting for transport.  Will continue to monitor until discharged.

## 2015-06-22 NOTE — Discharge Instructions (Signed)
You were discharged for very high blood pressure ("hypertensive urgency"). Since this is your second admission for this diagnosis this month, it is very important to keep your blood pressure under control. Please take your blood pressure medications EVERY DAY as prescribed.  It is also very important to attend your hospital follow-up appointment, so we can make any other changes to your medication if needed.  Your appointment is with Dr. Berkley Harvey, on Friday August 5 at 8:45 AM.

## 2015-06-26 MED ORDER — ENALAPRIL MALEATE 10 MG PO TABS: 20.0000 mg | ORAL_TABLET | ORAL | Status: DC

## 2015-06-26 NOTE — Telephone Encounter (Signed)
Spoke with patient on phone this morning regarding dose of enalapril. Told patient that she should be taking 20 mg of enalapril rather than 10 mg as written on her discharge summary. She voiced understanding with no questions. Her dose was actually ordered correctly from the pharmacy (10 mg tablets BID) so a new prescription will not be sent in. Discharge summary was updated to reflect these changes.

## 2015-06-27 ENCOUNTER — Other Ambulatory Visit (INDEPENDENT_AMBULATORY_CARE_PROVIDER_SITE_OTHER): Payer: Self-pay | Admitting: Family Medicine

## 2015-06-27 DIAGNOSIS — I10 Essential (primary) hypertension: Secondary | ICD-10-CM

## 2015-06-27 MED ORDER — CARVEDILOL 3.125 MG PO TABS: 3.1250 mg | ORAL_TABLET | Freq: Two times a day (BID) | ORAL | Status: DC

## 2015-07-01 ENCOUNTER — Ambulatory Visit (INDEPENDENT_AMBULATORY_CARE_PROVIDER_SITE_OTHER): Payer: Medicare Other | Admitting: Family Medicine

## 2015-07-01 ENCOUNTER — Encounter: Payer: Self-pay | Admitting: Family Medicine

## 2015-07-01 VITALS — BP 160/72 | HR 89 | Temp 98.2°F | Ht 63.0 in | Wt 192.0 lb

## 2015-07-01 DIAGNOSIS — G4452 New daily persistent headache (NDPH): Secondary | ICD-10-CM

## 2015-07-01 DIAGNOSIS — I10 Essential (primary) hypertension: Secondary | ICD-10-CM

## 2015-07-01 DIAGNOSIS — I160 Hypertensive urgency: Secondary | ICD-10-CM

## 2015-07-01 NOTE — Progress Notes (Signed)
  Patient name: Kimberly Carlson MRN [id number]  Date of birth: Apr 06, 1946  CC & HPI:  Kimberly Carlson is a 69 y.o. female presenting today for follow-up from hospitalization for headache and hypertensive urgency.  She reports some headache after returning home.  Headaches have been ongoing for the past 1-2 months; denies headaches prior to this.  Described as left frontal sharp with radiation to right frontal.  Associated with nausea and scotoma.  Denies vomiting, double vision, blurry vision, fevers.  Blood pressures have been mildly elevated since returning home.  She checks daily with a home meter and reports numbers 150s to 170s.  Denies chest pain, shortness of breath.  Checks blood pressure daily and reports numbers mostly well controlled; however, blood pressure always goes up with headaches.  She takes ibuprofen for headaches; reports Tylenol does not work. Denies medication noncompliance.  Denies any drug use.  Denies shoulder or hip stiffness or pain.  Family history unknown as she is adopted.  Denies tachycardia, palpitations, diaphoresis during these episodes of headaches and HTN.   ROS: See HPI   Medical & Surgical Hx:  Reviewed Medications & Allergies: Reviewed  Social History: Reviewed:   Objective Findings:  Vitals: BP 160/72 mmHg  Pulse 89  Temp(Src) 98.2 F (36.8 C) (Oral)  Ht 5\' 3"  (1.6 m)  Wt 192 lb (87.091 kg)  BMI 34.02 kg/m2  Gen: NAD HEENT: No temporal artery tenderness or sinus tenderness; PERRLA; EOMI CV: RRR mild systolic murmur, pulses +2 b/l DP and radial; Trace edema b/l Resp: CTAB w/ normal respiratory effort  Assessment & Plan:   Please See Problem Focused Assessment & Plan

## 2015-07-01 NOTE — Patient Instructions (Signed)
It was great seeing you today.   1. Check your blood pressure every morning and night, and keep these numbers written down. Take your blood pressure after sitting down for 5 minutes.  2. Call the clinic if your blood pressure is above 165/95 for 2-3 days in a row.  3. Keep a diary of your headaches. Try not to take Ibuprofen, aleve or motrin for your headaches. You can take tylenol 1000 mg up to three times a day, or call the clinic if the headaches are uncontrollable.  I will check your urine for something that could be causing your high blood pressure and headaches.    Please bring all your medications to every doctors visit  Sign up for My Chart to have easy access to your labs results, and communication with your Primary care physician.  Next Appointment  Please make an appointment with Dr Berkley Harvey in 1 month for blood pressure and headache   I look forward to talking with you again at our next visit. If you have any questions or concerns before then, please call the clinic at [phone].  Take Care,   Dr Phill Myron

## 2015-07-01 NOTE — Assessment & Plan Note (Addendum)
Pertinent S&O  Second episode of HTN urgency associated with HAs. She checks her blood pressures daily at home in BP normally well controlled.  Doubt these episodes are related to medication noncompliance or drug use (even though previous UDS + Cocaine)  FHx unknown - adopted   Take ibuprofen for HAs that have only been occuring in past 1-2 months Assessment  Episodic hypertensive urgency associated with headaches. Most likely due to labile hypertension.  Low suspicion for medication noncompliance or drug use.  Unsure if elevated BP is due to headaches. Concerning that she has new onset severe headaches; CT scan at last hospitalization was negative. Low suspicion for temporal arteritis given African American, no shoulder / hip pain/stiffness (though ESR 75). Low suspicion for pheochromocytoma; however, I think this is worth evaluating given that she does not know her family history (adopted). BP elevations could be associated with using NSAIDs to treat headaches in setting of CKD stage IIIa while on ACE-I. Mild systolic murmur noted.  Plan  Collect 24-hour urine metanephrines  Recommend keeping blood pressure log; advised to call if BP > 165/95 for 2-3 days in a row  Recommended keeping headache diary  Recommended not using NSAIDs due to CKD, ACE-I and potentially causing HTN episodes  Consider MRI brain  Consider ECHO in future given mild systolic murmur but asymptomatic for AS and inconsistent with intermittent HTN episodes  F/u in 1 month

## 2015-07-02 ENCOUNTER — Other Ambulatory Visit: Payer: Self-pay | Admitting: Family Medicine

## 2015-07-05 ENCOUNTER — Encounter: Payer: Self-pay | Admitting: Family Medicine

## 2015-07-06 DIAGNOSIS — I10 Essential (primary) hypertension: Secondary | ICD-10-CM | POA: Diagnosis not present

## 2015-07-06 DIAGNOSIS — G4452 New daily persistent headache (NDPH): Secondary | ICD-10-CM | POA: Diagnosis not present

## 2015-07-06 NOTE — Addendum Note (Signed)
Addended by: Lianne Bushy on: 07/06/2015 10:38 AM   Modules accepted: Orders

## 2015-07-11 LAB — METANEPHRINES, URINE, 24 HOUR
Metaneph Total, Ur: 226 mcg/24 h (ref 224–832)
Metanephrines, Ur: 60 mcg/24 h — ABNORMAL LOW (ref 90–315)
Normetanephrine, 24H Ur: 166 mcg/24 h (ref 122–676)

## 2015-07-27 ENCOUNTER — Other Ambulatory Visit: Payer: Self-pay | Admitting: Internal Medicine

## 2015-08-05 ENCOUNTER — Other Ambulatory Visit: Payer: Self-pay | Admitting: Internal Medicine

## 2015-08-10 ENCOUNTER — Ambulatory Visit (INDEPENDENT_AMBULATORY_CARE_PROVIDER_SITE_OTHER): Payer: Medicare Other | Admitting: Family Medicine

## 2015-08-10 VITALS — BP 144/64 | HR 86 | Temp 98.3°F | Ht 63.0 in | Wt 194.5 lb

## 2015-08-10 DIAGNOSIS — R600 Localized edema: Secondary | ICD-10-CM | POA: Diagnosis not present

## 2015-08-10 DIAGNOSIS — I10 Essential (primary) hypertension: Secondary | ICD-10-CM

## 2015-08-10 DIAGNOSIS — D649 Anemia, unspecified: Secondary | ICD-10-CM | POA: Diagnosis not present

## 2015-08-10 MED ORDER — GLUCOSE BLOOD VI STRP: ORAL_STRIP | Status: DC

## 2015-08-10 MED ORDER — HYDROCHLOROTHIAZIDE 25 MG PO TABS: 25.0000 mg | ORAL_TABLET | Freq: Every day | ORAL | Status: DC

## 2015-08-10 MED ORDER — AMLODIPINE BESYLATE 10 MG PO TABS: 10.0000 mg | ORAL_TABLET | Freq: Every day | ORAL | Status: DC

## 2015-08-10 NOTE — Patient Instructions (Addendum)
It was great seeing you today.    Restart hydrochlorothiazide 25 mg daily  See information below about low salt diet  Avoid taking NSAIDs: Aleve, ibuprofen, Motrin - Due to your kidney disease  I have referred you to nephrology for evaluation of your high blood pressure and kidney disease I have order some labs today to check your iron level. I will send you a letter with the results, or call you if we need to make any changes to your current therapies.  I recommend you getting a colonoscopy to evaluate for your anemia   Please bring all your medications to every doctors visit  Sign up for My Chart to have easy access to your labs results, and communication with your Primary care physician.  Next Appointment  Please make an appointment with Dr Berkley Harvey in 1 month for blood pressure  Make appointment with Dr Valentina Lucks in 2 weeks for high blood pressure   I look forward to talking with you again at our next visit. If you have any questions or concerns before then, please call the clinic at [phone].  Take Care,   Dr Phill Myron  Low-Sodium Eating Plan Sodium raises blood pressure and causes water to be held in the body. Getting less sodium from food will help lower your blood pressure, reduce any swelling, and protect your heart, liver, and kidneys. We get sodium by adding salt (sodium chloride) to food. Most of our sodium comes from canned, boxed, and frozen foods. Restaurant foods, fast foods, and pizza are also very high in sodium. Even if you take medicine to lower your blood pressure or to reduce fluid in your body, getting less sodium from your food is important. WHAT IS MY PLAN? Most people should limit their sodium intake to 2,300 mg a day. Your health care provider recommends that you limit your sodium intake to __________ a day.  WHAT DO I NEED TO KNOW ABOUT THIS EATING PLAN? For the low-sodium eating plan, you will follow these general guidelines:  Choose foods with a %  Daily Value for sodium of less than 5% (as listed on the food label).   Use salt-free seasonings or herbs instead of table salt or sea salt.   Check with your health care provider or pharmacist before using salt substitutes.   Eat fresh foods.  Eat more vegetables and fruits.  Limit canned vegetables. If you do use them, rinse them well to decrease the sodium.   Limit cheese to 1 oz (28 g) per day.   Eat lower-sodium products, often labeled as "lower sodium" or "no salt added."  Avoid foods that contain monosodium glutamate (MSG). MSG is sometimes added to [place] food and some canned foods.  Check food labels (Nutrition Facts labels) on foods to learn how much sodium is in one serving.  Eat more home-cooked food and less restaurant, buffet, and fast food.  When eating at a restaurant, ask that your food be prepared with less salt or none, if possible.  HOW DO I READ FOOD LABELS FOR SODIUM INFORMATION? The Nutrition Facts label lists the amount of sodium in one serving of the food. If you eat more than one serving, you must multiply the listed amount of sodium by the number of servings. Food labels may also identify foods as:  Sodium free--Less than 5 mg in a serving.  Very low sodium--35 mg or less in a serving.  Low sodium--140 mg or less in a serving.  Light in sodium--50%  less sodium in a serving. For example, if a food that usually has 300 mg of sodium is changed to become light in sodium, it will have 150 mg of sodium.  Reduced sodium--25% less sodium in a serving. For example, if a food that usually has 400 mg of sodium is changed to reduced sodium, it will have 300 mg of sodium. WHAT FOODS CAN I EAT? Grains Low-sodium cereals, including oats, puffed wheat and rice, and shredded wheat cereals. Low-sodium crackers. Unsalted rice and pasta. Lower-sodium bread.  Vegetables Frozen or fresh vegetables. Low-sodium or reduced-sodium canned vegetables. Low-sodium or  reduced-sodium tomato sauce and paste. Low-sodium or reduced-sodium tomato and vegetable juices.  Fruits Fresh, frozen, and canned fruit. Fruit juice.  Meat and Other Protein Products Low-sodium canned tuna and salmon. Fresh or frozen meat, poultry, seafood, and fish. Lamb. Unsalted nuts. Dried beans, peas, and lentils without added salt. Unsalted canned beans. Homemade soups without salt. Eggs.  Dairy Milk. Soy milk. Ricotta cheese. Low-sodium or reduced-sodium cheeses. Yogurt.  Condiments Fresh and dried herbs and spices. Salt-free seasonings. Onion and garlic powders. Low-sodium varieties of mustard and ketchup. Lemon juice.  Fats and Oils Reduced-sodium salad dressings. Unsalted butter.  Other Unsalted popcorn and pretzels.  The items listed above may not be a complete list of recommended foods or beverages. Contact your dietitian for more options. WHAT FOODS ARE NOT RECOMMENDED? Grains Instant hot cereals. Bread stuffing, pancake, and biscuit mixes. Croutons. Seasoned rice or pasta mixes. Noodle soup cups. Boxed or frozen macaroni and cheese. Self-rising flour. Regular salted crackers. Vegetables Regular canned vegetables. Regular canned tomato sauce and paste. Regular tomato and vegetable juices. Frozen vegetables in sauces. Salted french fries. Olives. Angie Fava. Relishes. Sauerkraut. Salsa. Meat and Other Protein Products Salted, canned, smoked, spiced, or pickled meats, seafood, or fish. Bacon, ham, sausage, hot dogs, corned beef, chipped beef, and packaged luncheon meats. Salt pork. Jerky. Pickled herring. Anchovies, regular canned tuna, and sardines. Salted nuts. Dairy Processed cheese and cheese spreads. Cheese curds. Blue cheese and cottage cheese. Buttermilk.  Condiments Onion and garlic salt, seasoned salt, table salt, and sea salt. Canned and packaged gravies. Worcestershire sauce. Tartar sauce. Barbecue sauce. Teriyaki sauce. Soy sauce, including reduced sodium.  Steak sauce. Fish sauce. Oyster sauce. Cocktail sauce. Horseradish. Regular ketchup and mustard. Meat flavorings and tenderizers. Bouillon cubes. Hot sauce. Tabasco sauce. Marinades. Taco seasonings. Relishes. Fats and Oils Regular salad dressings. Salted butter. Margarine. Ghee. Bacon fat.  Other Potato and tortilla chips. Corn chips and puffs. Salted popcorn and pretzels. Canned or dried soups. Pizza. Frozen entrees and pot pies.  The items listed above may not be a complete list of foods and beverages to avoid. Contact your dietitian for more information. Document Released: 05/04/2002 Document Revised: 11/17/2013 Document Reviewed: 09/16/2013 [facility] Patient Information 2015 [place], [place]. This information is not intended to replace advice given to you by your health care provider. Make sure you discuss any questions you have with your health care provider.

## 2015-08-11 DIAGNOSIS — R600 Localized edema: Secondary | ICD-10-CM | POA: Insufficient documentation

## 2015-08-11 LAB — IRON AND TIBC
%SAT: 25 % (ref 11–50)
Iron: 62 ug/dL (ref 45–160)
TIBC: 251 ug/dL (ref 250–450)
UIBC: 189 ug/dL (ref 125–400)

## 2015-08-11 LAB — FERRITIN: Ferritin: 399 ng/mL — ABNORMAL HIGH (ref 10–291)

## 2015-08-11 NOTE — Progress Notes (Signed)
  Patient name: Kimberly Carlson MRN [id number]  Date of birth: May 29, 1946  CC & HPI:  Kimberly Carlson is a 69 y.o. female presenting today for feet swelling.  She reports worsening swelling of her feet since stopping hydrochlorothiazide several months ago.  This is caused both her feet to remain swollen and sore.  She has tried elevation which helps some.  She cooks at home a lot, but has not been following a low salt diet.  She denies any chest pain, shortness of breath or PND; no history of heart problems.  She continues to take Norvasc 10 mg daily.  She brings blood pressure log in today that has moderately controlled BPs 130-170/70-90.  Continues to endorse episodic left temporal headache that occurs weekly.   ROS: See HPI   Medical & Surgical Hx:  Reviewed  Medications & Allergies: Reviewed  Social History: Reviewed:   Objective Findings:  Vitals: BP 144/64 mmHg  Pulse 86  Temp(Src) 98.3 F (36.8 C) (Oral)  Ht 5\' 3"  (1.6 m)  Wt 194 lb 8 oz (88.225 kg)  BMI 34.46 kg/m2  Gen: NAD CV: RRR w/o m/r/g, pulses +2 b/l Resp: CTAB w/ normal respiratory effort Lower Ext: No skin changes; 2+ edema b/l ; distal pulses intact; Calves nontender  Assessment & Plan:   HYPERTENSION, BENIGN SYSTEMIC Pertinent S&O  BP logs today with elevated BP at home 140-170/70-90s  Recent laboratory blood pressure monitoring revealed isolated systolic hypertension with overall average blood pressure of 148/62 mmHg  Worsening lower extremity swelling after discontinuation of HCTZ  Continues to use NSAIDs once a week  Anemia  CKD stage 3a Assessment  Borderline blood pressure control with underlying CKD and worsening LE swelling and proteinuria Plan  Restart hydrochlorothiazide 25 mg  Recommended continuing blood pressure log and following up with pharmacy clinic in 2 weeks.  Could consider discontinuing Norvasc at that time to help with lower extremity swelling  Recommended taking ACE  inhibitor at night  Referred to nephrology for further evaluation of CKD and proteinuria  Edema of both legs Worsening lower extremity swelling most likely multifactorial due to discontinuation of HCTZ, current treatment with calcium channel blocker, anemia and underlying CKD stage III - Restart HCTZ 25 mg daily - She will follow-up in pharmacy clinic in 2 weeks with blood pressure log.  Consider discontinuing calcium channel blocker at that visit   - Referred to nephrology for further evaluation of proteinuria - Check iron panel and ferritin - No evidence of heart failure.  Consider echo if swelling, not improved with above recommendations - I again recommended low-salt diet and elevation of feet when possible

## 2015-08-11 NOTE — Assessment & Plan Note (Signed)
Worsening lower extremity swelling most likely multifactorial due to discontinuation of HCTZ, current treatment with calcium channel blocker, anemia and underlying CKD stage III - Restart HCTZ 25 mg daily - She will follow-up in pharmacy clinic in 2 weeks with blood pressure log.  Consider discontinuing calcium channel blocker at that visit   - Referred to nephrology for further evaluation of proteinuria - Check iron panel and ferritin - No evidence of heart failure.  Consider echo if swelling, not improved with above recommendations - I again recommended low-salt diet and elevation of feet when possible

## 2015-08-11 NOTE — Assessment & Plan Note (Signed)
Pertinent S&O  BP logs today with elevated BP at home 140-170/70-90s  Recent laboratory blood pressure monitoring revealed isolated systolic hypertension with overall average blood pressure of 148/62 mmHg  Worsening lower extremity swelling after discontinuation of HCTZ  Continues to use NSAIDs once a week  Anemia  CKD stage 3a Assessment  Borderline blood pressure control with underlying CKD and worsening LE swelling and proteinuria Plan  Restart hydrochlorothiazide 25 mg  Recommended continuing blood pressure log and following up with pharmacy clinic in 2 weeks.  Could consider discontinuing Norvasc at that time to help with lower extremity swelling  Recommended taking ACE inhibitor at night  Referred to nephrology for further evaluation of CKD and proteinuria

## 2015-08-22 ENCOUNTER — Encounter: Payer: Self-pay | Admitting: Pharmacist

## 2015-08-22 ENCOUNTER — Ambulatory Visit (INDEPENDENT_AMBULATORY_CARE_PROVIDER_SITE_OTHER): Payer: Medicare Other | Admitting: Pharmacist

## 2015-08-22 VITALS — BP 160/75 | HR 90 | Wt 194.0 lb

## 2015-08-22 DIAGNOSIS — I10 Essential (primary) hypertension: Secondary | ICD-10-CM

## 2015-08-22 MED ORDER — ENALAPRIL MALEATE 10 MG PO TABS: 20.0000 mg | ORAL_TABLET | ORAL | Status: DC

## 2015-08-22 NOTE — Patient Instructions (Addendum)
Thank you for coming in today!   Please continue taking HCTZ 25 mg daily. We are decreasing amlodipine to 5mg  daily and increasing enalapril to 20mg  daily. We are also increasing carvedilol to 6.25mg  twice daily.   Please check your blood sugar and blood pressure whenever you feel dizzy.  Follow up in 3 to 4 weeks with Dr. Valentina Lucks.

## 2015-08-22 NOTE — Progress Notes (Signed)
S:    Patient arrives in good spirits walking without assistance.  Presents to the clinic for blood pressure evaluation.  Diagnosed with Hypertension in the year of 2012.    Medication compliance is reported to be good. Patient reports some dizziness which could be due to low BG or low BP, including some dizzziness this morning.  She does not currently monitor BG or BP during her dizziness episodes.  Patient reports swelling in both of her feet when she is sitting and at night.  She believes swelling began in July 2016 around the same time she was started on amlodipine.  She reports swelling worsened after HCTZ was discontinued, but reports swelling has improved since she started back on HCTZ.   Current BP Medications include:  Carvedilol 3.125mg  BID, amlodipine 10mg  daily, enalapril 10mg  daily in the morning (however, was prescribed 20mg  daily), and HCTZ 25mg  daily.   Patient presents with home BP log.  BP readings have ranged from 140-160/60-80.    O:  BP: 166/64, 160/75 (manual recheck)   Last 3 Office BP readings: BP Readings from Last 3 Encounters:  08/10/15 144/64  07/01/15 160/72  06/22/15 142/61   BMET    Component Value Date/Time   NA 141 06/21/2015 0657   K 3.9 06/21/2015 0657   CL 107 06/21/2015 0657   CO2 26 06/21/2015 0657   GLUCOSE 168* 06/21/2015 0657   BUN 24* 06/21/2015 0657   CREATININE 1.19* 06/21/2015 0657   CREATININE 1.20* 02/17/2015 1507   CALCIUM 8.8* 06/21/2015 0657   GFRNONAA 45* 06/21/2015 0657   GFRAA 52* 06/21/2015 0657    A/P: History of hypertension since 2012 found to have isolated systolic hypertension.  Patient reports improvement in LE swelling since restarting HCTZ, but continues to report occasional swelling.  Patient has 1+ pitting edema today in clinic.  Changes to medications include decreasing amlodipine to 5mg  daily, increasing carvedilol to 6.25mg  BID, increasing enalapril to 20mg  daily, and continuing HCTZ 25mg  daily.  Encouraged patient  to avoid ibuprofen use.  Patient to start monitoring BG and BP during her dizziness episodes and bring results to next visit. Will reassess LE swelling and BP at her next visit.     Results reviewed and written information provided.  Total time in face-to-face counseling 30 minutes.   F/U Clinic Visit with Dr. Valentina Lucks in 3 to 4 weeks.  Patient seen with Roxy Manns, PharmD Candidate, Bennye Alm, PharmD Resident and Elisabeth Most, PharmD, resident.

## 2015-08-22 NOTE — Assessment & Plan Note (Addendum)
Not at relaxed goal of <160/90.  History of hypertension since 2012 found to have isolated systolic hypertension.  Patient reports improvement in LE swelling since restarting HCTZ, but continues to report occasional swelling.  Patient has 1+ pitting edema today in clinic.  Changes to medications include decreasing amlodipine to 5mg  daily, increasing carvedilol to 6.25mg  BID, increasing enalapril to 20mg  daily, and continuing HCTZ 25mg  daily.  Encouraged patient to avoid ibuprofen use. Patient to start monitoring BG and BP during her dizziness episodes and bring results to next visit. Will reassess LE swelling and BP at her next visit.

## 2015-08-22 NOTE — Progress Notes (Signed)
Patient ID: Kimberly Carlson, female   DOB: 12-26-1945, 69 y.o.   MRN: [id number] Reviewed: I agree with Dr. Graylin Shiver documentation and management.

## 2015-08-23 ENCOUNTER — Other Ambulatory Visit: Payer: Self-pay | Admitting: Family Medicine

## 2015-09-12 ENCOUNTER — Ambulatory Visit (INDEPENDENT_AMBULATORY_CARE_PROVIDER_SITE_OTHER): Payer: Medicare Other | Admitting: Pharmacist

## 2015-09-12 ENCOUNTER — Encounter: Payer: Self-pay | Admitting: Pharmacist

## 2015-09-12 VITALS — BP 179/63 | HR 89 | Ht 62.5 in | Wt 194.0 lb

## 2015-09-12 DIAGNOSIS — I10 Essential (primary) hypertension: Secondary | ICD-10-CM

## 2015-09-12 DIAGNOSIS — E7800 Pure hypercholesterolemia, unspecified: Secondary | ICD-10-CM

## 2015-09-12 DIAGNOSIS — Z794 Long term (current) use of insulin: Secondary | ICD-10-CM

## 2015-09-12 DIAGNOSIS — E1165 Type 2 diabetes mellitus with hyperglycemia: Secondary | ICD-10-CM

## 2015-09-12 LAB — BASIC METABOLIC PANEL
BUN: 33 mg/dL — ABNORMAL HIGH (ref 7–25)
CO2: 25 mmol/L (ref 20–31)
Calcium: 9.2 mg/dL (ref 8.6–10.4)
Chloride: 105 mmol/L (ref 98–110)
Creat: 1.11 mg/dL — ABNORMAL HIGH (ref 0.60–0.93)
Glucose, Bld: 189 mg/dL — ABNORMAL HIGH (ref 65–99)
Potassium: 4 mmol/L (ref 3.5–5.3)
Sodium: 139 mmol/L (ref 135–146)

## 2015-09-12 MED ORDER — INSULIN GLARGINE 100 UNIT/ML ~~LOC~~ SOLN: 28.0000 [IU] | Freq: Every day | SUBCUTANEOUS | Status: DC

## 2015-09-12 MED ORDER — AMLODIPINE BESYLATE 2.5 MG PO TABS: 2.5000 mg | ORAL_TABLET | Freq: Every evening | ORAL | Status: DC

## 2015-09-12 MED ORDER — LISINOPRIL 20 MG PO TABS: 20.0000 mg | ORAL_TABLET | Freq: Every evening | ORAL | Status: DC

## 2015-09-12 MED ORDER — CARVEDILOL 6.25 MG PO TABS: 6.2500 mg | ORAL_TABLET | Freq: Two times a day (BID) | ORAL | Status: DC

## 2015-09-12 NOTE — Assessment & Plan Note (Signed)
Hx of hyperlipidemia:  Atorvastatin 40 mg moved to every evening

## 2015-09-12 NOTE — Patient Instructions (Addendum)
Changes to your medications today:  Continue Hydrochlorothiazide 25 mg every morning through next week  Take Carvedilol 6.25 mg twice daily (new prescription).  You may finish the 3.125 mg tablets by taking 2 tablets twice daily.   Reduce Amlodipine to 2.5 mg every evening before bedtime (new prescription).  Monitor for swelling and tolerability.  Stop taking Enalapril; Start taking Lisinopril 20 mg every evening before bedtime (new prescription)  Take Atorvastatin 40 mg every evening   Increase Lantus to 28 units every morning.  Return to see Dr. Valentina Lucks next week.  Please bring all medications with you to that appointment.  Please bring your blood sugar monitor next week as well.

## 2015-09-12 NOTE — Progress Notes (Signed)
S:    Patient arrives in good spirits, ambulating on her own.    Presents to the clinic for follow up hypertension management. Diagnosed with Hypertension in the year of 2012.    Medication compliance is reported to lacking due to confusion with recent changes and concerns about days supply remaining.  Patient was not taking 2 tablets carvedilolol 3.125 mg twice daily, but 2 in the morning and 1 in the evening.  Patient was not taking enalapril 20 mg, believing it to be combined within the hydrochlorothiazide tablet.   Current BP Medications include:  Amlodipine 5 mg/d, Carvedilol 6.25 mg in morning and 3.125 mg in evening, hydrochlorothiazide 25 mg.  Antihypertensives tried in the past include: Amlodipine 10 mg  Patient returned to the clinic and reported improvement in leg swelling with decreased dose of Amlodipine, but still bothersome.  Patient reports recent elevation of home blood sugar readings in the high 100's (usually 110-120)  O:   Last 3 Office BP readings: BP Readings from Last 3 Encounters:  08/22/15 160/75  08/10/15 144/64  07/01/15 160/72    Today's Office BP reading: 220/79 mmHg (automatic reading of Left Arm) and 179/63 (automatic reading of Right Arm)   BMET    Component Value Date/Time   NA 141 06/21/2015 0657   K 3.9 06/21/2015 0657   CL 107 06/21/2015 0657   CO2 26 06/21/2015 0657   GLUCOSE 168* 06/21/2015 0657   BUN 24* 06/21/2015 0657   CREATININE 1.19* 06/21/2015 0657   CREATININE 1.20* 02/17/2015 1507   CALCIUM 8.8* 06/21/2015 0657   GFRNONAA 45* 06/21/2015 0657   GFRAA 52* 06/21/2015 0657    A/P: History of hypertension since 2012 found to have isolated systolic hypertension.  Lack of control due to noncompliance with previous medication changes. Changes to medications include:  Continue Hydrochlorothiazide 25 mg every morning.  Continue Carvedilol 6.25 mg twice daily (ordered new prescription 6.25 mg tablets).  Reduce Amlodipine to 2.5 mg every  evening before bedtime.  Patient instructed to monitor for leg swelling and tolerability.  Stop taking Enalapril; Start taking Lisinopril 20 mg every evening before bedtime.    Hx of hyperlipidemia:  Atorvastatin 40 mg moved to every evening  History of diabetes for more than 5 years.  Patient states that blood glucose readings have been in the high 100's over the last week.  Increased Lantus to 28 units every morning.  Patient counseled to monitor for low blood sugars and to reduce dose with sugars less than 100.   Results reviewed and written information provided.  Total time in face-to-face counseling 20 minutes.   F/U Clinic Visit with Dr. Valentina Lucks in 1 week.  Patient seen with Viann Fish, PharmD candidate

## 2015-09-12 NOTE — Assessment & Plan Note (Signed)
History of diabetes for more than 5 years.  Patient states that blood glucose readings have been in the high 100's over the last week.  Increased Lantus to 28 units every morning.  Patient counseled to monitor for low blood sugars and to reduce dose with sugars less than 100.

## 2015-09-12 NOTE — Progress Notes (Signed)
Patient ID: Kimberly Carlson, female   DOB: 04/05/1946, 69 y.o.   MRN: [id number] Reviewed: agree with Dr. Graylin Shiver documentation and management.

## 2015-09-12 NOTE — Assessment & Plan Note (Signed)
History of hypertension since 2012 found to have isolated systolic hypertension.  Lack of control due to noncompliance with previous medication changes. Changes to medications include:  Continue Hydrochlorothiazide 25 mg every morning.  Continue Carvedilol 6.25 mg twice daily (ordered new prescription 6.25 mg tablets).  Reduce Amlodipine to 2.5 mg every evening before bedtime.  Patient instructed to monitor for leg swelling and tolerability.  Stop taking Enalapril; Start taking Lisinopril 20 mg every evening before bedtime.

## 2015-09-19 ENCOUNTER — Encounter: Payer: Self-pay | Admitting: Pharmacist

## 2015-09-19 ENCOUNTER — Ambulatory Visit (INDEPENDENT_AMBULATORY_CARE_PROVIDER_SITE_OTHER): Payer: Medicare Other | Admitting: Pharmacist

## 2015-09-19 VITALS — BP 162/59 | HR 73 | Ht 62.0 in | Wt 194.8 lb

## 2015-09-19 DIAGNOSIS — I10 Essential (primary) hypertension: Secondary | ICD-10-CM | POA: Diagnosis not present

## 2015-09-19 DIAGNOSIS — F17200 Nicotine dependence, unspecified, uncomplicated: Secondary | ICD-10-CM

## 2015-09-19 MED ORDER — SPIRONOLACTONE 25 MG PO TABS: 25.0000 mg | ORAL_TABLET | Freq: Every day | ORAL | Status: DC

## 2015-09-19 NOTE — Progress Notes (Signed)
S:    Patient arrives in good spirits, ambulating without assistance.    Presents to the clinic for follow-up from changes to hypertension management.  Diagnosed with Hypertension in the year of 2012.    Medication compliance is reported to be good, taking evening medications at 7:30pm.  Current BP Medications include:  Amlodipine 2.5 mg qPM, Lisinopril 20 mg qPM, Carvedilol 6.25 mg BID, Hydrochlorothiazide 25 mg qAM.  Antihypertensives tried in the past include: Amlodipine 5mg , 10mg  (peripheral edema)  Patient reports home blood pressures 150-160/60-70 and home blood glucose reading 130-160.  Patient reports a 2 day headache on the left side of the head requiring 2 ibuprofen tablets each day.  Headaches in the past come in cycles lasting weeks to months.  Patient endorses dizziness, blurry vision and foot swelling.  Patient recently quit smoking on August 27th, 2016.   O:   Last 3 Office BP readings: BP Readings from Last 3 Encounters:  09/12/15 179/63  08/22/15 160/75  08/10/15 144/64     Today's Office BP reading: 162/59 mmHg (automatic cuff reading, sitting, left arm) Today's Office HR reading: 73   BMET    Component Value Date/Time   NA 139 09/12/2015 1204   K 4.0 09/12/2015 1204   CL 105 09/12/2015 1204   CO2 25 09/12/2015 1204   GLUCOSE 189* 09/12/2015 1204   BUN 33* 09/12/2015 1204   CREATININE 1.11* 09/12/2015 1204   CREATININE 1.19* 06/21/2015 0657   CALCIUM 9.2 09/12/2015 1204   GFRNONAA 45* 06/21/2015 0657   GFRAA 52* 06/21/2015 0657    A/P: Patient has a history of hypertension since 2012.  Recent changes to blood pressure therapy included an increase in carvedilol, switch to lisinopril from enalpril, and a dose reduction of amlodipine due to swelling.  Patient continues to endorse lower leg swelling that worsens throughout the day while on lowest dose of amlodpine.  Patient will discontinue Amlodipine, but continue Lisinopril and Hydrochlorothiazide.  Start  Spironolactone 25 mg daily  (Most recent potassium level of 4.0).  Patient educated on purpose, proper use and potential side effects of the spironolactone.  She will return to clinic in 1-2 weeks for repeat blood work and blood pressure assessment.  Patient told to call the clinic if she continues to get dizzy or if her home systolic blood pressure falls below 130.    History of tobacco abuse now reports quit for ~ 2 months after hospitalization.  Appears confident in continued abstinence.  Encouraged continued abstinence.   Results reviewed and written information provided.  Total time in face-to-face counseling 25 minutes.   F/U Clinic Visit with Dr. Valentina Lucks in 1-2 weeks.  Patient seen with Viann Fish, PharmD candidate.

## 2015-09-19 NOTE — Patient Instructions (Addendum)
Thanks for coming in today.     Stop Amlodipine (stopping this should improve your leg swelling)  We will be starting spironolactone 25mg  once daily (please take this at night).   Follow up in 2 weeks with Pharmacy clinic for blood pressure reevaluation.    Bring your medicines to that visit.

## 2015-09-19 NOTE — Assessment & Plan Note (Signed)
History of tobacco abuse now reports quit for ~ 2 months after hospitalization.  Appears confident in continued abstinence.  Encouraged continued abstinence.

## 2015-09-19 NOTE — Progress Notes (Signed)
   Subjective:    Patient ID: Kimberly Carlson, female    DOB: 01-22-1946, 69 y.o.   MRN: [id number]  HPI Reviewed: Agree with Dr. Graylin Shiver documentation and management.    Review of Systems     Objective:   Physical Exam        Assessment & Plan:

## 2015-09-19 NOTE — Assessment & Plan Note (Signed)
Patient has a history of hypertension since 2012.  Recent changes to blood pressure therapy included an increase in carvedilol, switch to lisinopril from enalpril, and a dose reduction of amlodipine due to swelling.  Patient continues to endorse lower leg swelling that worsens throughout the day while on lowest dose of amlodpine.  Patient will discontinue Amlodipine, but continue Lisinopril and Hydrochlorothiazide.  Start Spironolactone 25 mg daily  (Most recent potassium level of 4.0).  Patient educated on purpose, proper use and potential side effects of the spironolactone.  Kimberly Carlson will return to clinic in 1-2 weeks for repeat blood work and blood pressure assessment.  Patient told to call the clinic if Kimberly Carlson continues to get dizzy or if her home systolic blood pressure falls below 130.

## 2015-09-23 ENCOUNTER — Encounter: Payer: Self-pay | Admitting: Family Medicine

## 2015-10-03 ENCOUNTER — Ambulatory Visit (INDEPENDENT_AMBULATORY_CARE_PROVIDER_SITE_OTHER): Payer: Medicare Other | Admitting: Pharmacist

## 2015-10-03 ENCOUNTER — Encounter: Payer: Self-pay | Admitting: Pharmacist

## 2015-10-03 VITALS — BP 178/71 | HR 97 | Ht 62.0 in | Wt 195.5 lb

## 2015-10-03 DIAGNOSIS — E7800 Pure hypercholesterolemia, unspecified: Secondary | ICD-10-CM | POA: Diagnosis not present

## 2015-10-03 DIAGNOSIS — I10 Essential (primary) hypertension: Secondary | ICD-10-CM | POA: Diagnosis not present

## 2015-10-03 DIAGNOSIS — F17200 Nicotine dependence, unspecified, uncomplicated: Secondary | ICD-10-CM

## 2015-10-03 LAB — BASIC METABOLIC PANEL
BUN: 34 mg/dL — ABNORMAL HIGH (ref 7–25)
CO2: 26 mmol/L (ref 20–31)
Calcium: 8.7 mg/dL (ref 8.6–10.4)
Chloride: 105 mmol/L (ref 98–110)
Creat: 1.17 mg/dL — ABNORMAL HIGH (ref 0.60–0.93)
Glucose, Bld: 130 mg/dL — ABNORMAL HIGH (ref 65–99)
Potassium: 4.3 mmol/L (ref 3.5–5.3)
Sodium: 142 mmol/L (ref 135–146)

## 2015-10-03 LAB — LDL CHOLESTEROL, DIRECT: Direct LDL: 89 mg/dL (ref <130)

## 2015-10-03 MED ORDER — CLONIDINE HCL 0.1 MG PO TABS: 0.1000 mg | ORAL_TABLET | Freq: Two times a day (BID) | ORAL | Status: DC

## 2015-10-03 NOTE — Assessment & Plan Note (Signed)
History of tobacco abuse.  Quit 2 months ago.  Remains good candidate for long-term successful abstinence.

## 2015-10-03 NOTE — Assessment & Plan Note (Signed)
Hypercholesterolemia: switched from simvastatin to atorvastatin 40mg  approximately 3-4 months ago. Follow up today with direct LDL panel.

## 2015-10-03 NOTE — Progress Notes (Addendum)
S:    Patient arrives walking unassisted in good spirits. Presents to the clinic for follow up with her BP. Patient now taking atorvastatin, was taking simvastatin 3-4 months ago. Denies myalgias.  Current BP Medications include:  Carvedilol 6.25 mg BID, HCTZ 25 mg QD, lisinopril 20 mg QHS, spironolactone 25 mg QD.   Antihypertensives tried in the past include: amlodipine (cause peripheral swelling)  Patient returned to the clinic and reported BPs in the 170's and 180s, and states that she can tell if BPs are high through some congestion in her head.   O:   Last 3 Office BP readings: BP Readings from Last 3 Encounters:  10/03/15 178/71  09/19/15 162/59  09/12/15 179/63     Today's Office BP reading: 178/71 mmHg (automatic reading) BMET    Component Value Date/Time   NA 139 09/12/2015 1204   K 4.0 09/12/2015 1204   CL 105 09/12/2015 1204   CO2 25 09/12/2015 1204   GLUCOSE 189* 09/12/2015 1204   BUN 33* 09/12/2015 1204   CREATININE 1.11* 09/12/2015 1204   CREATININE 1.19* 06/21/2015 0657   CALCIUM 9.2 09/12/2015 1204   GFRNONAA 45* 06/21/2015 0657   GFRAA 52* 06/21/2015 0657    A/P: Hypertension: History of longstanding, uncontrolled hypertension. Amlodipine caused peripheral edema, and it was stopped at the last visit. Trace edema and less than in the past  was seen today. Spironolactone 25 mg was added at her last visit, BMET today. Changes to medications today include addition of clonidine 0.1 mg BID. Reviewed proper use and possible adverse reactions.  F/U Rx Clinic Visit in 2-3 weeks.    Post:  BMET revealed Potassium of 4.3 and Scr of 1.1.7 (unchanged significantly from pre-spironolactone).   Continue dose of spironolactone at this time.   Hypercholesterolemia: switched from simvastatin to atorvastatin 40mg  approximately 3-4 months ago. Follow up today with direct LDL panel.    Results reviewed and written information provided.  Total time in face-to-face counseling 30  minutes. Patient seen with Angela Burke, and Liliane Shi.

## 2015-10-03 NOTE — Assessment & Plan Note (Signed)
Hypertension: History of longstanding, uncontrolled hypertension. Amlodipine caused peripheral edema, and it was stopped at the last visit. Trace edema and less than in the past  was seen today. Spironolactone 25 mg was added at her last visit, BMET today. Changes to medications today include addition of clonidine 0.1 mg BID. Reviewed proper use and possible adverse reactions.  F/U Rx Clinic Visit in 2-3 weeks

## 2015-10-03 NOTE — Patient Instructions (Signed)
Thank you for coming to visit today!  We are going to get some lab work today to make sure you are tolerating new medication changes. We will follow up with you on the results of these.  Your blood pressure in the clinic today was 178/71 and is still elevated. We are going to add a new medication, clonidine 0.1 mg twice a day, which will hopefully help with your blood pressure. Please also remember to adherence to a heart healthy diet, particularly focusing on decreasing your sodium intake.   Please schedule a follow up in clinic with Dr. Valentina Lucks in 2-3 weeks.

## 2015-10-04 NOTE — Progress Notes (Signed)
Patient ID: Kimberly Carlson, female   DOB: 1946/08/25, 69 y.o.   MRN: [id number] Reviewed: Agree with Dr. Graylin Shiver documentation and management.

## 2015-10-13 ENCOUNTER — Other Ambulatory Visit: Payer: Self-pay | Admitting: Family Medicine

## 2015-10-13 ENCOUNTER — Encounter: Payer: Self-pay | Admitting: Family Medicine

## 2015-10-13 DIAGNOSIS — I10 Essential (primary) hypertension: Secondary | ICD-10-CM

## 2015-10-13 MED ORDER — LISINOPRIL 20 MG PO TABS: 20.0000 mg | ORAL_TABLET | Freq: Every evening | ORAL | Status: DC

## 2015-10-13 NOTE — Telephone Encounter (Signed)
Refill request from pt through [facility]. Will forward to PCP and MD covering for PCP for review. Jalayla Chrismer, CMA.

## 2015-10-17 ENCOUNTER — Other Ambulatory Visit: Payer: Self-pay | Admitting: Family Medicine

## 2015-10-17 DIAGNOSIS — I10 Essential (primary) hypertension: Secondary | ICD-10-CM

## 2015-10-17 MED ORDER — HYDROCHLOROTHIAZIDE 25 MG PO TABS: 25.0000 mg | ORAL_TABLET | Freq: Every day | ORAL | Status: DC

## 2015-10-17 MED ORDER — LISINOPRIL 20 MG PO TABS: 20.0000 mg | ORAL_TABLET | Freq: Every evening | ORAL | Status: DC

## 2015-10-25 ENCOUNTER — Encounter: Payer: Self-pay | Admitting: Pharmacist

## 2015-10-25 ENCOUNTER — Ambulatory Visit (INDEPENDENT_AMBULATORY_CARE_PROVIDER_SITE_OTHER): Payer: Medicare Other | Admitting: Pharmacist

## 2015-10-25 VITALS — BP 168/68 | HR 91 | Ht 63.0 in | Wt 198.3 lb

## 2015-10-25 DIAGNOSIS — I10 Essential (primary) hypertension: Secondary | ICD-10-CM

## 2015-10-25 DIAGNOSIS — F17200 Nicotine dependence, unspecified, uncomplicated: Secondary | ICD-10-CM

## 2015-10-25 MED ORDER — LISINOPRIL 40 MG PO TABS: 40.0000 mg | ORAL_TABLET | Freq: Every day | ORAL | Status: DC

## 2015-10-25 NOTE — Assessment & Plan Note (Signed)
Patient has a history of HTN that is not controlled on multiple medications. Concerned that elevated in office readings have a strong component of white coat hypertension. Today we increased patient's lisinopril to 40mg  daily and discontinued clonidine.  Consider increasing carvedilol, increasing spironolactone or changing thiazide as next steps at follow u

## 2015-10-25 NOTE — Progress Notes (Signed)
S:    Patient arrives in good spirits and no distress. Presents to the clinic for blood pressure follow up.  Medication compliance is reported to be excellent. Pt was started on clonidine at last visit due to resistant hypertension. Pt had one dose of clonidine and experienced dizziness, dry mouth, and light headedness. Pt document BPs daily since last visit and readings were consistently systolic's [id number] and diastolic's [id number]. Pts readings were elevated in the clinic today to 198/72 and 168/68 on repeat. Pt had recent ambulatory blood pressure monitoring which averaged systolics in the A999333 range. There is some concern with white coat HTN in this patient.   Current BP Medications include: lisinopril 20mg , spironolactone 25 mg, carvedilol 6.25 mg BID.   Antihypertensives tried in the past include: amlodpine resulted in edema, clonidine was prescribed but pt did not tolerate.  Dietary habits include: pt endorses eating what she wants, not paying much attention to diet.   O:   Last 3 Office BP readings: BP Readings from Last 3 Encounters:  10/03/15 178/71  09/19/15 162/59  09/12/15 179/63     Today's Office BP reading: 198/72 mmHg, repeated 168/68   BMET    Component Value Date/Time   NA 142 10/03/2015 0938   K 4.3 10/03/2015 0938   CL 105 10/03/2015 0938   CO2 26 10/03/2015 0938   GLUCOSE 130* 10/03/2015 0938   BUN 34* 10/03/2015 0938   CREATININE 1.17* 10/03/2015 0938   CREATININE 1.19* 06/21/2015 0657   CALCIUM 8.7 10/03/2015 0938   GFRNONAA 45* 06/21/2015 0657   GFRAA 52* 06/21/2015 0657    A/P: Patient has a history of HTN that is not controlled on multiple medications. Concerned that elevated in office readings have a strong component of white coat hypertension. Today we increased patient's lisinopril to 40mg  daily and discontinued clonidine.  Consider increasing carvedilol, increasing spironolactone or changing thiazide as next steps at follow up.    Results  reviewed and written information provided.  Total time in face-to-face counseling 30 minutes.   F/U Clinic Visit with Dr. Valentina Lucks on December 15.  Patient seen with Marylou Flesher, PharmD Candidate, and Angela Burke, PharmD Resident.

## 2015-10-25 NOTE — Progress Notes (Signed)
Patient ID: Kimberly Carlson, female   DOB: [birth year], 69 y.o.   MRN: [id number] Reviewed: Agree with Dr. Graylin Shiver documentation and management.

## 2015-10-25 NOTE — Patient Instructions (Signed)
Thank you for coming to visit today!  Your blood pressure was elevated in clinic today, but you home readings look like that have been doing much better. We are going to increase your lisinopril dose to 40 mg once a day.  Please follow up with Dr. Valentina Lucks on December 15th for a follow up visit.

## 2015-10-25 NOTE — Assessment & Plan Note (Signed)
Continues to abstain from tobacco use at this time.

## 2015-10-28 ENCOUNTER — Encounter: Payer: Self-pay | Admitting: Family Medicine

## 2015-10-31 ENCOUNTER — Encounter: Payer: Self-pay | Admitting: Student

## 2015-10-31 ENCOUNTER — Ambulatory Visit (INDEPENDENT_AMBULATORY_CARE_PROVIDER_SITE_OTHER): Payer: Medicare Other | Admitting: Student

## 2015-10-31 VITALS — BP 210/78 | HR 81 | Temp 98.2°F | Wt 196.0 lb

## 2015-10-31 DIAGNOSIS — I10 Essential (primary) hypertension: Secondary | ICD-10-CM | POA: Diagnosis present

## 2015-10-31 LAB — POCT URINALYSIS DIPSTICK
Bilirubin, UA: NEGATIVE
Glucose, UA: NEGATIVE
Ketones, UA: NEGATIVE
Nitrite, UA: NEGATIVE
Protein, UA: >=300
Spec Grav, UA: 1.02
Urobilinogen, UA: 0.2
pH, UA: 6.5

## 2015-10-31 LAB — POCT UA - MICROSCOPIC ONLY: WBC, Ur, HPF, POC: >20

## 2015-10-31 MED ORDER — CARVEDILOL 6.25 MG PO TABS: 12.5000 mg | ORAL_TABLET | Freq: Two times a day (BID) | ORAL | Status: DC

## 2015-10-31 NOTE — Patient Instructions (Signed)
Follow up in 1 week for blood pressure check If you have dizziness, lightheadedness, chest pain or headache call the office at go to the emergency room If you have questions or concerns, call the office at [phone]

## 2015-10-31 NOTE — Assessment & Plan Note (Signed)
Continued uncontrolled hypertension without evidence of end organ damage clinically - will increase Coreg to 12.5 BID from 6.25 BID - strong return precautions given - Will return in 1 week for blood pressure check - will need BMP at that time to evaluate renal function

## 2015-10-31 NOTE — Progress Notes (Signed)
   Subjective:    Patient ID: Kimberly Carlson, female    DOB: [birth year], 69 y.o.   MRN: [id number]   CC: Elevated BP  HPI 69 y/o with H/o hypertension presents for elevated BP on home check  Hypertension - Blood pressure managed with coreg, HCTZ, lisinopril and spironolactone - reports compliance with her BP regimen, but over the last week BPs have been in the 180s-200s/80s-100s - denies headache, chest pain, SOB, dizziness, changes in vision - she has been sen by Dr Valentina Lucks for difficult to control hypertension  Otherwise denies recent illness, fevers/chills, N/V/D  Review of Systems   See HPI for ROS.   Past Medical History  Diagnosis Date  . Hypertension   . Diabetes mellitus without complication ([place])   . Back pain     herniated disc lower back   Past Surgical History  Procedure Laterality Date  . Abdominal hysterectomy     OB History    No data available     Social History   Social History  . Marital Status: Widowed    Spouse Name: N/A  . Number of Children: N/A  . Years of Education: N/A   Occupational History  . Not on file.   Social History Main Topics  . Smoking status: Former Smoker -- 1.00 packs/day    Types: Cigarettes    Start date: 04/26/2014    Quit date: 07/23/2015  . Smokeless tobacco: Never Used     Comment: Started in her 31's with multiple quit attempts for about 1 year at a time.  . Alcohol Use: No  . Drug Use: No  . Sexual Activity: Not on file   Other Topics Concern  . Not on file   Social History Narrative    Objective:  BP 210/78 mmHg  Pulse 81  Temp(Src) 98.2 F (36.8 C) (Oral)  Wt 196 lb (88.905 kg) Vitals and nursing note reviewed  General: NAD Cardiac: RRR, no murmurs ausultated Respiratory: CTAB, normal effort Abdomen: soft, nontender, nondistended, Neuro: alert and oriented, no focal deficits   Assessment & Plan:    HYPERTENSION, BENIGN SYSTEMIC Continued uncontrolled hypertension without evidence of  end organ damage clinically - will increase Coreg to 12.5 BID from 6.25 BID - strong return precautions given - Will return in 1 week for blood pressure check - will need BMP at that time to evaluate renal function     Alyssa A. Lincoln Brigham MD, [facility] Resident PGY-2 Pager [phone]

## 2015-11-03 ENCOUNTER — Other Ambulatory Visit: Payer: Self-pay

## 2015-11-03 ENCOUNTER — Emergency Department (HOSPITAL_COMMUNITY)
Admission: EM | Admit: 2015-11-03 | Discharge: 2015-11-03 | Disposition: A | Discharge status: Home / Self Care | Payer: Medicare Other | Attending: Emergency Medicine | Admitting: Emergency Medicine

## 2015-11-03 ENCOUNTER — Encounter (HOSPITAL_COMMUNITY): Payer: Self-pay | Admitting: Emergency Medicine

## 2015-11-03 ENCOUNTER — Emergency Department (HOSPITAL_COMMUNITY): Payer: Medicare Other

## 2015-11-03 DIAGNOSIS — I10 Essential (primary) hypertension: Secondary | ICD-10-CM | POA: Diagnosis present

## 2015-11-03 DIAGNOSIS — R42 Dizziness and giddiness: Secondary | ICD-10-CM | POA: Diagnosis not present

## 2015-11-03 DIAGNOSIS — Z7982 Long term (current) use of aspirin: Secondary | ICD-10-CM | POA: Diagnosis not present

## 2015-11-03 DIAGNOSIS — Z79899 Other long term (current) drug therapy: Secondary | ICD-10-CM | POA: Diagnosis not present

## 2015-11-03 DIAGNOSIS — E119 Type 2 diabetes mellitus without complications: Secondary | ICD-10-CM | POA: Insufficient documentation

## 2015-11-03 DIAGNOSIS — Z87891 Personal history of nicotine dependence: Secondary | ICD-10-CM | POA: Diagnosis not present

## 2015-11-03 DIAGNOSIS — Z794 Long term (current) use of insulin: Secondary | ICD-10-CM | POA: Diagnosis not present

## 2015-11-03 LAB — BASIC METABOLIC PANEL
Anion gap: 7 (ref 5–15)
BUN: 28 mg/dL — ABNORMAL HIGH (ref 6–20)
CO2: 26 mmol/L (ref 22–32)
Calcium: 9 mg/dL (ref 8.9–10.3)
Chloride: 109 mmol/L (ref 101–111)
Creatinine, Ser: 1.18 mg/dL — ABNORMAL HIGH (ref 0.44–1.00)
GFR calc Af Amer: 52 mL/min — ABNORMAL LOW (ref >60)
GFR calc non Af Amer: 45 mL/min — ABNORMAL LOW (ref >60)
Glucose, Bld: 100 mg/dL — ABNORMAL HIGH (ref 65–99)
Potassium: 4.3 mmol/L (ref 3.5–5.1)
Sodium: 142 mmol/L (ref 135–145)

## 2015-11-03 LAB — CBC
HCT: 31.6 % — ABNORMAL LOW (ref 36.0–46.0)
Hemoglobin: 10.1 g/dL — ABNORMAL LOW (ref 12.0–15.0)
MCH: 29.2 pg (ref 26.0–34.0)
MCHC: 32 g/dL (ref 30.0–36.0)
MCV: 91.3 fL (ref 78.0–100.0)
Platelets: 205 10*3/uL (ref 150–400)
RBC: 3.46 MIL/uL — ABNORMAL LOW (ref 3.87–5.11)
RDW: 12.3 % (ref 11.5–15.5)
WBC: 9.2 10*3/uL (ref 4.0–10.5)

## 2015-11-03 LAB — I-STAT TROPONIN, ED: Troponin i, poc: 0.01 ng/mL (ref 0.00–0.08)

## 2015-11-03 MED ORDER — METOPROLOL TARTRATE 25 MG PO TABS
25.0000 mg | ORAL_TABLET | Freq: Once | ORAL | Status: AC
  Administered 2015-11-03: 25 mg via ORAL
  Filled 2015-11-03: qty 1

## 2015-11-03 MED ORDER — CARVEDILOL 12.5 MG PO TABS
12.5000 mg | ORAL_TABLET | Freq: Once | ORAL | Status: AC
  Administered 2015-11-03: 12.5 mg via ORAL
  Filled 2015-11-03: qty 1

## 2015-11-03 MED ORDER — METOPROLOL TARTRATE 25 MG PO TABS: 25.0000 mg | ORAL_TABLET | Freq: Two times a day (BID) | ORAL | Status: DC

## 2015-11-03 NOTE — ED Notes (Signed)
Pt states for the last 6 days she has been taking her blood pressure at home and it was been "high" pt states in A999333 systolic. Pt called pcp and was told to come to ED. Pt denies any blurry vision or headache.

## 2015-11-03 NOTE — ED Notes (Signed)
Pt A&OX4, ambulatory at d/c with steady gait, NAD and reports she has all of her belongings with her at d/c

## 2015-11-03 NOTE — ED Provider Notes (Addendum)
CSN: [id number]     Arrival date & time 11/03/15  1256 History   First MD Initiated Contact with Patient 11/03/15 1559     Chief Complaint  Patient presents with  . Hypertension     (Consider location/radiation/quality/duration/timing/severity/associated sxs/prior Treatment) The history is provided by the patient.  Kimberly Carlson is a 69 y.o. female hx of HTN, DM, here with elevated BP, dizziness. Patient states that  she's been having elevated blood pressure for the last week or so. States that her systolic ranges from Q000111Q to 230s. She has been taking HCTZ 25 mg daily, lisinopril 40 mg daily (increased several weeks ago), coreg 6.25 mg twice daily. She saw PCP 2 days ago and coreg was increased to 12.5 mg twice daily. States that she feels dizzy when her blood pressure was elevated. Denies headaches or falling. Denies chest pain or abdominal pain. Sent by PCP for evaluation. Has been compliant with meds.   Past Medical History  Diagnosis Date  . Hypertension   . Diabetes mellitus without complication ([place])   . Back pain     herniated disc lower back   Past Surgical History  Procedure Laterality Date  . Abdominal hysterectomy     Family History  Problem Relation Age of Onset  . Adopted: Yes   Social History  Substance Use Topics  . Smoking status: Former Smoker -- 1.00 packs/day    Types: Cigarettes    Start date: 04/26/2014    Quit date: 07/23/2015  . Smokeless tobacco: Never Used     Comment: Started in her 36's with multiple quit attempts for about 1 year at a time.  . Alcohol Use: No   OB History    No data available     Review of Systems  Neurological: Positive for dizziness.  All other systems reviewed and are negative.     Allergies  Amlodipine; Clonidine derivatives; and Metformin  Home Medications   Prior to Admission medications   Medication Sig Start Date End Date Taking? Authorizing Provider  aspirin (ASPIRIN CHILDRENS) 81 MG chewable tablet  Chew 81 mg by mouth daily.     Yes Historical Provider, MD  atorvastatin (LIPITOR) 40 MG tablet Take 1 tablet (40 mg total) by mouth every evening. 09/12/15  Yes Zenia Resides, MD  carvedilol (COREG) 6.25 MG tablet Take 2 tablets (12.5 mg total) by mouth 2 (two) times daily with a meal. 10/31/15  Yes Alyssa A Haney, MD  hydrochlorothiazide (HYDRODIURIL) 25 MG tablet Take 1 tablet (25 mg total) by mouth daily. 10/17/15  Yes Olam Idler, MD  ibuprofen (ADVIL,MOTRIN) 200 MG tablet Take 400 mg by mouth every 6 (six) hours as needed for moderate pain.   Yes Historical Provider, MD  insulin glargine (LANTUS) 100 UNIT/ML injection Inject 0.28 mLs (28 Units total) into the skin daily. 09/12/15  Yes Zenia Resides, MD  lisinopril (PRINIVIL,ZESTRIL) 40 MG tablet Take 1 tablet (40 mg total) by mouth daily. 10/25/15  Yes Zenia Resides, MD  spironolactone (ALDACTONE) 25 MG tablet Take 1 tablet (25 mg total) by mouth daily. 09/19/15  Yes Zenia Resides, MD  glucose blood (ONE TOUCH ULTRA TEST) test strip Check sugar 6 x daily 08/10/15   Olam Idler, MD  Insulin Syringe-Needle U-100 (INSULIN SYRINGE 1CC/31GX5/16") 31G X 5/16" 1 ML MISC 1 each by Does not apply route See admin instructions. Use daily with insulin. 03/07/15   [place] N Rumley, DO  ONE TOUCH LANCETS MISC 1  each by Does not apply route See admin instructions. Check blood sugar once daily. 02/21/15   Olam Idler, MD   BP 148/59 mmHg  Pulse 58  Temp(Src) 98.7 F (37.1 C) (Oral)  Resp 12  Ht 5\' 3"  (1.6 m)  Wt 196 lb (88.905 kg)  BMI 34.73 kg/m2  SpO2 100% Physical Exam  Constitutional: She is oriented to person, place, and time. She appears well-developed and well-nourished.  HENT:  Head: Normocephalic.  Mouth/Throat: Oropharynx is clear and moist.  Eyes: Conjunctivae are normal. Pupils are equal, round, and reactive to light.  No obvious nystagmus   Neck: Normal range of motion. Neck supple.  Cardiovascular: Normal rate,  regular rhythm and normal heart sounds.   Pulmonary/Chest: Effort normal and breath sounds normal. No respiratory distress. She has no wheezes. She has no rales.  Abdominal: Soft. Bowel sounds are normal. She exhibits no distension. There is no tenderness. There is no rebound.  Musculoskeletal: Normal range of motion. She exhibits no edema or tenderness.  Neurological: She is alert and oriented to person, place, and time. No cranial nerve deficit. Coordination normal.  CN 2-12 intact. Nl finger to nose. Nl strength throughout   Skin: Skin is warm and dry.  Psychiatric: She has a normal mood and affect. Her behavior is normal. Judgment and thought content normal.  Nursing note and vitals reviewed.   ED Course  Procedures (including critical care time) Labs Review Labs Reviewed  BASIC METABOLIC PANEL - Abnormal; Notable for the following:    Glucose, Bld 100 (*)    BUN 28 (*)    Creatinine, Ser 1.18 (*)    GFR calc non Af Amer 45 (*)    GFR calc Af Amer 52 (*)    All other components within normal limits  CBC - Abnormal; Notable for the following:    RBC 3.46 (*)    Hemoglobin 10.1 (*)    HCT 31.6 (*)    All other components within normal limits  I-STAT TROPOININ, ED    Imaging Review Ct Head Wo Contrast  11/03/2015  CLINICAL DATA:  Dizziness several weeks. EXAM: CT HEAD WITHOUT CONTRAST TECHNIQUE: Contiguous axial images were obtained from the base of the skull through the vertex without intravenous contrast. COMPARISON:  CT 06/06/2015 FINDINGS: No acute intracranial hemorrhage. No focal mass lesion. No CT evidence of acute infarction. No midline shift or mass effect. No hydrocephalus. Basilar cisterns are patent. Paranasal sinuses and  mastoid air cells are clear. IMPRESSION: 1. No acute intracranial findings. 2. Normal head CT for age. Electronically Signed   By: Suzy Bouchard M.D.   On: 11/03/2015 18:12   I have personally reviewed and evaluated these images and lab results as  part of my medical decision-making.   EKG Interpretation   Date/Time:  Thursday November 03 2015 16:23:21 EST Ventricular Rate:  76 PR Interval:  181 QRS Duration: 95 QT Interval:  395 QTC Calculation: 444 R Axis:   66 Text Interpretation:  Sinus rhythm Minimal ST depression, inferior leads  Baseline wander in lead(s) III No significant change since last tracing  Confirmed by Shenay Torti  MD, Ballard Budney ([id number]) on 11/03/2015 5:09:33 PM      ED ECG REPORT   Date: 11/03/2015  Rate: 76  Rhythm: normal sinus rhythm  QRS Axis: normal  Intervals: normal  ST/T Wave abnormalities: nonspecific ST changes  Conduction Disutrbances:none  Narrative Interpretation:   Old EKG Reviewed: unchanged  I have personally reviewed the EKG tracing and  agree with the computerized printout as noted.   MDM   Final diagnoses:  None    SHERELYN BORGELT is a 69 y.o. female here with dizziness, hypertension. I think likely difficult to control hypertension. Will get CT head to r/o bleed given occasional dizziness. Will check labs. I noticed that she is maxed out on lisinopril, HCTZ and coreg recently increased. Has allergy to clonidine. Will try metoprolol to help control BP.   6:44 PM BP now 148/59 after metoprolol 25 mg and coreg 12.5 mg. Maybe she responds well to beta blockers. Will start metoprolol 25 mg BID. She will need to see family practice next week for BP check.    Wandra Arthurs, MD 11/03/15 1849  Wandra Arthurs, MD 11/03/15 [phone]

## 2015-11-03 NOTE — Discharge Instructions (Signed)
Continue your current meds.   Add metoprolol 25 mg twice daily .  See your doctor next week to recheck blood pressure.   Return to ER if you have severe headaches, dizziness, chest pain.

## 2015-11-03 NOTE — Telephone Encounter (Signed)
Called patient to discuss her HTN. She reports her BP is currently 230/120 even after increasing her Coreg recently. Denies CP, SOB, numbness, weakness or speech difficulties.   Plan - Advised go to ED for evaluation. She has had labile BPs for a while. Ambulatory BP monitoring was done and showed normal pressure, however she developed several episodes of HTN urgency after that without cause. Has one time UDS positive for Cocaine but I believe that to be a false positive. 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 She needs IV BP medications now, but every time we increase her BP regimen she develops hypotension when the paroxysmal HTN episode resolves.  - Has appointment with Dr Valentina Lucks on 11/10/15 for ongoing HTN management. Consider providing prn medication for these episodes.  - Likely needs referral to HTN specialists in [place] - Likely needs to repeat her 24-hour urine collection for fractionated metanephrines and catecholamines during one of these episodes OR check plasma fractionated metanephrines

## 2015-11-10 ENCOUNTER — Ambulatory Visit (INDEPENDENT_AMBULATORY_CARE_PROVIDER_SITE_OTHER): Payer: Medicare Other | Admitting: Pharmacist

## 2015-11-10 ENCOUNTER — Encounter: Payer: Self-pay | Admitting: Pharmacist

## 2015-11-10 VITALS — BP 174/76 | Wt 177.0 lb

## 2015-11-10 DIAGNOSIS — Z87891 Personal history of nicotine dependence: Secondary | ICD-10-CM | POA: Diagnosis present

## 2015-11-10 DIAGNOSIS — I10 Essential (primary) hypertension: Secondary | ICD-10-CM

## 2015-11-10 MED ORDER — CARVEDILOL 12.5 MG PO TABS: 12.5000 mg | ORAL_TABLET | Freq: Two times a day (BID) | ORAL | Status: DC

## 2015-11-10 MED ORDER — VERAPAMIL HCL ER 120 MG PO TBCR: 120.0000 mg | EXTENDED_RELEASE_TABLET | Freq: Every day | ORAL | Status: DC

## 2015-11-10 NOTE — Assessment & Plan Note (Signed)
Quit for 3 months!   Continues to be a great candidate for long-term quit.   Changed to past smoker Dx.

## 2015-11-10 NOTE — Assessment & Plan Note (Signed)
History of hypertension since 2002 with continued poor control despite recent addition of metoprolol.  Denies use of verapamil in the past.   Will stop metoprolol.  Continue carvedilol and initiate 120mg  verapamil once daily.   Reevaluate BP with Dr. Berkley Harvey next week in his clinic.  Discussed with patient the possibility of considering additional work-up with hypertension specialty clinic in the near future if we are unable to gain control of her blood pressure.  She was open to considering referral.   Total time in face-to-face counseling an education 15 minutes.

## 2015-11-10 NOTE — Progress Notes (Signed)
S:    Patient arrives ambulating without assistance.  States she feels terrible/aweful and has no energy.    Presents to the clinic for blood pressure evaluation   Diagnosed with Hypertension in 2002.  Medication compliance is reported to be excellent.  Discussed procedure for wearing the monitor and gave patient written instructions. Monitor was placed on non-dominant arm with instructions to return in the morning.   Current BP Medications include: Carvedilol 12.5mg  BID, HCTZ 25 mg daily, Spironolactone 25mg  daily, and lopressor 25mg  BID (started in   Antihypertensives tried in the past include: Amlodipine and clonidine.    Dietary habits include trying to eat healthy.  O:   Last 3 Office BP readings: BP Readings from Last 3 Encounters:  11/03/15 146/64  10/31/15 210/78  10/25/15 168/68     Today's Office BP reading: 174/68mmHg (manual reading) Right Arm - Seated > 5 minutes.   BMET    Component Value Date/Time   NA 142 11/03/2015 1322   K 4.3 11/03/2015 1322   CL 109 11/03/2015 1322   CO2 26 11/03/2015 1322   GLUCOSE 100* 11/03/2015 1322   BUN 28* 11/03/2015 1322   CREATININE 1.18* 11/03/2015 1322   CREATININE 1.17* 10/03/2015 0938   CALCIUM 9.0 11/03/2015 1322   GFRNONAA 45* 11/03/2015 1322   GFRAA 52* 11/03/2015 1322    A/P: History of hypertension since 2002 with continued poor control despite recent addition of metoprolol.  Denies use of verapamil in the past.   Will stop metoprolol.  Continue carvedilol and initiate 120mg  verapamil once daily.   Reevaluate BP with Dr. Berkley Harvey next week in his clinic.  Discussed with patient the possibility of considering additional work-up with hypertension specialty clinic in the near future if we are unable to gain control of her blood pressure.  She was open to considering referral.   Total time in face-to-face counseling an education 15 minutes.

## 2015-11-10 NOTE — Patient Instructions (Signed)
Great to see you today.     Please stop the metoprolol.   Please start Verapamil 120mg  once daily tonight.   Take this medicine once daily in the evening.   Please continue to take carvedilol 12.5mg  twice daily at this time (A new prescription for this to be taken 1 pill twice daily was sent to your pharmacy).   Next follow up with Dr. Berkley Harvey in the next two weeks.

## 2015-11-10 NOTE — Progress Notes (Signed)
Patient ID: Kimberly Carlson, female   DOB: 20-Nov-1946, 69 y.o.   MRN: [id number] Reviewed: Agree with Dr. Graylin Shiver documentation and management.

## 2015-11-12 ENCOUNTER — Other Ambulatory Visit: Payer: Self-pay | Admitting: Family Medicine

## 2015-11-17 ENCOUNTER — Ambulatory Visit (INDEPENDENT_AMBULATORY_CARE_PROVIDER_SITE_OTHER): Payer: Medicare Other | Admitting: Family Medicine

## 2015-11-17 ENCOUNTER — Encounter: Payer: Self-pay | Admitting: Family Medicine

## 2015-11-17 VITALS — BP 188/80 | HR 73 | Temp 99.0°F | Wt 197.2 lb

## 2015-11-17 DIAGNOSIS — I160 Hypertensive urgency: Secondary | ICD-10-CM | POA: Diagnosis not present

## 2015-11-17 DIAGNOSIS — I10 Essential (primary) hypertension: Secondary | ICD-10-CM | POA: Diagnosis present

## 2015-11-17 DIAGNOSIS — R079 Chest pain, unspecified: Secondary | ICD-10-CM | POA: Diagnosis not present

## 2015-11-17 MED ORDER — CARVEDILOL 25 MG PO TABS: 25.0000 mg | ORAL_TABLET | Freq: Two times a day (BID) | ORAL | Status: DC

## 2015-11-17 MED ORDER — SPIRONOLACTONE 25 MG PO TABS: 25.0000 mg | ORAL_TABLET | Freq: Every day | ORAL | Status: DC

## 2015-11-17 MED ORDER — HYDROCODONE-ACETAMINOPHEN 5-325 MG PO TABS: 1.0000 | ORAL_TABLET | Freq: Four times a day (QID) | ORAL | Status: DC | PRN

## 2015-11-17 NOTE — Assessment & Plan Note (Signed)
She continues to have paroxysmal elevations of blood pressure.  This current episode has been going on for the past two weeks. She has some home BP readings 140/80s with others 200/120s.  Continues to deny any anxiety / stress triggering these episodes.  She has had episodes of hypotension and dizziness with increased medications in the past, once episodes have resolved and previous urine metanephrines were negative.  8 / 2016.  Differential includes labile blood pressures versus pseudo-pheochromocytoma. Though she does have action tremor left hand and resting tremor in her lips / chin -  These seem to be chronic and stable;   No signs of bradykinesia or rigidity to indicate Parkinson's with autonomic dysfunction however MSA multiple system atrophy remains a possibility.  Renal artery stenosis also possible;  However no abdominal bruits present and  o significant increasing creatinine with ACE-I.  -  Refer to hypertension specialist at [facility]: Hypertension and Vascular Research: Phone: [phone]. FAXZZ:[id number]. Clinic Appointments: [phone] -  Once again advised against NSAIDs as these could be contributing to HTN (especially while on ACE-I, and if has renal artery stenosis) or causing rebound Headache which contribute to elevated BPs - Advised Tylenol 1000mg  TID prn for HA; Also given Norco # 30 so that she doesn't need to use NSAIDs for HA treatment - Inc Coreg to 25mg  BID; Advised to call if develops  Dizziness, lightheadedness - Echo and Renal duplex ordered -  If BP remains labile consider  Switching beta blocker to atenolol and bisoprolol and adding alpha-blocker. Could also consider Klonopin 0.5mg  BID - F/u in 2-3 weeks with either Dr Valentina Lucks or Dr Berkley Harvey to continue to evaluate and treat hypertension while awaiting visit with HTN specialist

## 2015-11-17 NOTE — Progress Notes (Signed)
  Patient name: Kimberly Carlson MRN [id number]  Date of birth: [birth year]  CC & HPI:  Kimberly Carlson is a 69 y.o. female presenting today for HTN.  -  She continues to report home blood pressures running 140s to 123456  With diastolics 123XX123 to 0000000 -  She has not taken verapamil since 12/15 b/c she reports chest tightness after taking the first dose of this medication -  She continues to report intermittent headaches that resolve in between episodes.  She reports trying to limit her Advil, but admits to taking this daily for the past week, sometimes twice a day.   ROS: See HPI   Medications & Allergies: Reviewed  Social History: Reviewed:   Objective Findings:  Vitals: BP 188/80 mmHg  Pulse 73  Temp(Src) 99 F (37.2 C) (Oral)  Wt 197 lb 3.2 oz (89.449 kg)  Gen: NAD CV: RRR w/o m/r/g, pulses +2 b/l Resp: CTAB w/ normal respiratory effort Neuro: A&Ox4; Gross Sensory & Motor intact; Action tremor in left hand.  No bradykinesia or rigidity  Assessment & Plan:   Hypertensive urgency  She continues to have paroxysmal elevations of blood pressure.  This current episode has been going on for the past two weeks. She has some home BP readings 140/80s with others 200/120s.  Continues to deny any anxiety / stress triggering these episodes.  She has had episodes of hypotension and dizziness with increased medications in the past, once episodes have resolved and previous urine metanephrines were negative.  8 / 2016.  Differential includes labile blood pressures versus pseudo-pheochromocytoma. Though she does have action tremor left hand and resting tremor in her lips / chin -  These seem to be chronic and stable;   No signs of bradykinesia or rigidity to indicate Parkinson's with autonomic dysfunction however MSA multiple system atrophy remains a possibility.  Renal artery stenosis also possible;  However no abdominal bruits present and  o significant increasing creatinine with ACE-I.  -  Refer to  hypertension specialist at [facility]: Hypertension and Vascular Research: Phone: [phone]. FAXZZ:[id number]. Clinic Appointments: [phone] -  Once again advised against NSAIDs as these could be contributing to HTN (especially while on ACE-I, and if has renal artery stenosis) or causing rebound Headache which contribute to elevated BPs - Advised Tylenol 1000mg  TID prn for HA; Also given Norco # 30 so that she doesn't need to use NSAIDs for HA treatment - Inc Coreg to 25mg  BID; Advised to call if develops  Dizziness, lightheadedness - Echo and Renal duplex ordered -  If BP remains labile consider  Switching beta blocker to atenolol and bisoprolol and adding alpha-blocker. Could also consider Klonopin 0.5mg  BID - F/u in 2-3 weeks with either Dr Valentina Lucks or Dr Berkley Harvey to continue to evaluate and treat hypertension while awaiting visit with HTN specialist

## 2015-11-17 NOTE — Patient Instructions (Addendum)
It was great seeing you today.   1.  I have referred to to hypertension specialist.  Please let me know if he has not been contacted by them in 2 weeks.  2.  Increase your carvedilol to 25 mg twice a day.  Please let me know if he develops any dizziness, lightheadedness.  3. Continue to monitor your blood pressure 2-3 times a day.  4.  Avoid  Ibuprofen, Aleve, Advil. 5. Take Tylenol 1000mg  up to three times a day as needed for headache.    Please bring all your medications to every doctors visit  Sign up for My Chart to have easy access to your labs results, and communication with your Primary care physician.  Next Appointment  Please make an appointment with Dr Berkley Harvey or Dr Valentina Lucks in 2-3 weeks for blood pressure   I look forward to talking with you again at our next visit. If you have any questions or concerns before then, please call the clinic at [phone].  Take Care,   Dr Phill Myron

## 2015-11-24 ENCOUNTER — Telehealth (HOSPITAL_COMMUNITY): Payer: Self-pay | Admitting: Family Medicine

## 2015-12-05 ENCOUNTER — Encounter (HOSPITAL_COMMUNITY): Payer: Self-pay

## 2015-12-05 ENCOUNTER — Other Ambulatory Visit (HOSPITAL_COMMUNITY): Payer: Medicare Other

## 2015-12-08 ENCOUNTER — Ambulatory Visit (INDEPENDENT_AMBULATORY_CARE_PROVIDER_SITE_OTHER): Payer: Medicare Other | Admitting: Family Medicine

## 2015-12-08 ENCOUNTER — Encounter: Payer: Self-pay | Admitting: Family Medicine

## 2015-12-08 VITALS — BP 180/71 | HR 82 | Temp 99.1°F | Wt 196.8 lb

## 2015-12-08 DIAGNOSIS — M542 Cervicalgia: Secondary | ICD-10-CM

## 2015-12-08 DIAGNOSIS — I10 Essential (primary) hypertension: Secondary | ICD-10-CM

## 2015-12-08 NOTE — Assessment & Plan Note (Signed)
Intermittent left neck pain concerning for Trigeminal neuralgia.  To this point pain has been only occurred at night and well controlled by Norco.  -  Patient declines additional medications or evaluation at this time.  She will call if symptoms persist or become worse

## 2015-12-08 NOTE — Progress Notes (Signed)
  Patient name: Kimberly Carlson MRN [id number]  Date of birth: 1946-10-02  CC & HPI:  Kimberly Carlson is a 70 y.o. female presenting today for HTN and left neck pain.   HTN -  She reports blood pressures have been better since last visit.  Reports max systolic pressure [id number].  She continues to check her blood pressure twice a day.  He cannot recall the diastolic pressures -  Reports headaches have resolved -  Has only used Advil 1-2 times in the past few weeks for new Left neck pain -  Has not completed her echo or renal Doppler yet -  Has appointment with hypertension specialists on 12/22/15 -  Due to improvement in blood pressure she never increased Coreg.  Continues to take 12.5 twice a day  Left neck pain -  She reports sharp stabbing pain has been occurring intermittently for the past 1-2 weeks pain in left side of her neck from posterior radiates up to her left forehead and down into her left shoulder.  Pain primarily occurs at night.  Denies any numbness in her left arm or decrease in strength.  Denies any trauma to her head or neck.  Denies any tinnitus or decreased hearing.  No vision problems -  She has taken Advil one to times of the past week for this,  Additionally, she has been taking Norco as previously prescribed for her headache which allows her to sleep  ROS: See HPI   Medical & Surgical Hx:  Reviewed  Medications & Allergies: Reviewed  Social History: Reviewed:   Objective Findings:  Vitals: BP 180/71 mmHg  Pulse 82  Temp(Src) 99.1 F (37.3 C) (Oral)  Wt 196 lb 12.8 oz (89.268 kg)  Gen: NAD; Vitals reviewed Neck: No rash. No tender to palpation.  Negative [place] Neuro:  Bilateral upper extremity strength and sensation intact CV: RRR w/o m/r/g, pulses +2 b/l Resp: CTAB w/ normal respiratory effort  Assessment & Plan:   HYPERTENSION, BENIGN SYSTEMIC  BP remains elevated, but improved from last visit.  Home blood pressure readings much improved.  -  No  changes to blood pressure medications made today.  Continue lisinopril 40 mg daily , hydrochlorothiazide 25 mg daily,  Coreg 12.5 mg twice a day, spironolactone 25 mg daily -  She will send Epic note to report her home blood pressure readings over the past 1-2 weeks.  May make blood pressure medication changes based on those readings.  -  Recommended she complete the echo and renal Dopplers prior to her appointment with the hypertension specialist at the end of this month - Will continue to monitor home blood pressure and make follow-up appointment if needed prior to seeing HTN specialist  Neck pain on left side Intermittent left neck pain concerning for Trigeminal neuralgia.  To this point pain has been only occurred at night and well controlled by Norco.  -  Patient declines additional medications or evaluation at this time.  She will call if symptoms persist or become worse

## 2015-12-08 NOTE — Assessment & Plan Note (Signed)
BP remains elevated, but improved from last visit.  Home blood pressure readings much improved.  -  No changes to blood pressure medications made today.  Continue lisinopril 40 mg daily , hydrochlorothiazide 25 mg daily,  Coreg 12.5 mg twice a day, spironolactone 25 mg daily -  She will send Epic note to report her home blood pressure readings over the past 1-2 weeks.  May make blood pressure medication changes based on those readings.  -  Recommended she complete the echo and renal Dopplers prior to her appointment with the hypertension specialist at the end of this month - Will continue to monitor home blood pressure and make follow-up appointment if needed prior to seeing HTN specialist

## 2015-12-09 ENCOUNTER — Encounter: Payer: Self-pay | Admitting: Family Medicine

## 2015-12-12 ENCOUNTER — Other Ambulatory Visit: Payer: Self-pay | Admitting: *Deleted

## 2015-12-12 DIAGNOSIS — E1165 Type 2 diabetes mellitus with hyperglycemia: Secondary | ICD-10-CM

## 2015-12-12 DIAGNOSIS — Z794 Long term (current) use of insulin: Principal | ICD-10-CM

## 2015-12-13 ENCOUNTER — Ambulatory Visit (HOSPITAL_COMMUNITY): Payer: Medicare Other | Attending: Cardiology

## 2015-12-13 ENCOUNTER — Other Ambulatory Visit: Payer: Self-pay

## 2015-12-13 DIAGNOSIS — E785 Hyperlipidemia, unspecified: Secondary | ICD-10-CM | POA: Insufficient documentation

## 2015-12-13 DIAGNOSIS — R079 Chest pain, unspecified: Secondary | ICD-10-CM | POA: Insufficient documentation

## 2015-12-13 DIAGNOSIS — Z6834 Body mass index (BMI) 34.0-34.9, adult: Secondary | ICD-10-CM | POA: Insufficient documentation

## 2015-12-13 DIAGNOSIS — I160 Hypertensive urgency: Secondary | ICD-10-CM | POA: Diagnosis not present

## 2015-12-13 DIAGNOSIS — E669 Obesity, unspecified: Secondary | ICD-10-CM | POA: Insufficient documentation

## 2015-12-13 DIAGNOSIS — Z87891 Personal history of nicotine dependence: Secondary | ICD-10-CM | POA: Insufficient documentation

## 2015-12-13 DIAGNOSIS — I38 Endocarditis, valve unspecified: Secondary | ICD-10-CM | POA: Insufficient documentation

## 2015-12-13 DIAGNOSIS — E119 Type 2 diabetes mellitus without complications: Secondary | ICD-10-CM | POA: Diagnosis not present

## 2015-12-13 DIAGNOSIS — I517 Cardiomegaly: Secondary | ICD-10-CM | POA: Insufficient documentation

## 2015-12-13 DIAGNOSIS — I10 Essential (primary) hypertension: Secondary | ICD-10-CM | POA: Diagnosis not present

## 2015-12-13 MED ORDER — INSULIN GLARGINE 100 UNIT/ML ~~LOC~~ SOLN: 28.0000 [IU] | Freq: Every day | SUBCUTANEOUS | Status: DC

## 2015-12-15 DIAGNOSIS — I160 Hypertensive urgency: Secondary | ICD-10-CM | POA: Diagnosis not present

## 2015-12-15 DIAGNOSIS — N2581 Secondary hyperparathyroidism of renal origin: Secondary | ICD-10-CM | POA: Diagnosis not present

## 2015-12-15 DIAGNOSIS — I10 Essential (primary) hypertension: Secondary | ICD-10-CM | POA: Diagnosis not present

## 2015-12-15 DIAGNOSIS — N183 Chronic kidney disease, stage 3 (moderate): Secondary | ICD-10-CM | POA: Diagnosis not present

## 2015-12-21 ENCOUNTER — Other Ambulatory Visit: Payer: Self-pay | Admitting: Nephrology

## 2015-12-21 DIAGNOSIS — N183 Chronic kidney disease, stage 3 unspecified: Secondary | ICD-10-CM

## 2015-12-27 ENCOUNTER — Ambulatory Visit
Admission: RE | Admit: 2015-12-27 | Discharge: 2015-12-27 | Disposition: A | Discharge status: Home / Self Care | Payer: Medicare Other | Source: Ambulatory Visit | Attending: Nephrology | Admitting: Nephrology

## 2015-12-27 DIAGNOSIS — N183 Chronic kidney disease, stage 3 unspecified: Secondary | ICD-10-CM

## 2015-12-27 DIAGNOSIS — N189 Chronic kidney disease, unspecified: Secondary | ICD-10-CM | POA: Diagnosis not present

## 2015-12-27 DIAGNOSIS — I129 Hypertensive chronic kidney disease with stage 1 through stage 4 chronic kidney disease, or unspecified chronic kidney disease: Secondary | ICD-10-CM | POA: Diagnosis not present

## 2016-01-12 ENCOUNTER — Other Ambulatory Visit: Payer: Self-pay | Admitting: *Deleted

## 2016-01-12 DIAGNOSIS — I10 Essential (primary) hypertension: Secondary | ICD-10-CM

## 2016-01-12 MED ORDER — CARVEDILOL 25 MG PO TABS: 25.0000 mg | ORAL_TABLET | Freq: Two times a day (BID) | ORAL | Status: DC

## 2016-01-19 DIAGNOSIS — N183 Chronic kidney disease, stage 3 (moderate): Secondary | ICD-10-CM | POA: Diagnosis not present

## 2016-01-25 DIAGNOSIS — I10 Essential (primary) hypertension: Secondary | ICD-10-CM | POA: Diagnosis not present

## 2016-01-25 DIAGNOSIS — N2581 Secondary hyperparathyroidism of renal origin: Secondary | ICD-10-CM | POA: Diagnosis not present

## 2016-01-25 DIAGNOSIS — N183 Chronic kidney disease, stage 3 (moderate): Secondary | ICD-10-CM | POA: Diagnosis not present

## 2016-02-05 ENCOUNTER — Other Ambulatory Visit: Payer: Self-pay | Admitting: Family Medicine

## 2016-02-06 ENCOUNTER — Other Ambulatory Visit: Payer: Self-pay | Admitting: Family Medicine

## 2016-02-06 DIAGNOSIS — H25813 Combined forms of age-related cataract, bilateral: Secondary | ICD-10-CM | POA: Diagnosis not present

## 2016-02-06 DIAGNOSIS — E119 Type 2 diabetes mellitus without complications: Secondary | ICD-10-CM | POA: Diagnosis not present

## 2016-02-06 DIAGNOSIS — I10 Essential (primary) hypertension: Secondary | ICD-10-CM

## 2016-02-06 LAB — HM DIABETES EYE EXAM

## 2016-02-06 MED ORDER — HYDROCHLOROTHIAZIDE 25 MG PO TABS: 25.0000 mg | ORAL_TABLET | Freq: Every day | ORAL | Status: DC

## 2016-02-27 ENCOUNTER — Ambulatory Visit (INDEPENDENT_AMBULATORY_CARE_PROVIDER_SITE_OTHER): Payer: Medicare Other | Admitting: Family Medicine

## 2016-02-27 VITALS — BP 182/67 | HR 72 | Temp 98.2°F | Ht 63.0 in | Wt 196.5 lb

## 2016-02-27 DIAGNOSIS — H269 Unspecified cataract: Secondary | ICD-10-CM | POA: Diagnosis not present

## 2016-02-27 DIAGNOSIS — R059 Cough, unspecified: Secondary | ICD-10-CM

## 2016-02-27 DIAGNOSIS — Z794 Long term (current) use of insulin: Secondary | ICD-10-CM | POA: Diagnosis not present

## 2016-02-27 DIAGNOSIS — E1165 Type 2 diabetes mellitus with hyperglycemia: Secondary | ICD-10-CM

## 2016-02-27 DIAGNOSIS — M545 Low back pain, unspecified: Secondary | ICD-10-CM | POA: Insufficient documentation

## 2016-02-27 DIAGNOSIS — R05 Cough: Secondary | ICD-10-CM

## 2016-02-27 DIAGNOSIS — E559 Vitamin D deficiency, unspecified: Secondary | ICD-10-CM

## 2016-02-27 LAB — POCT GLYCOSYLATED HEMOGLOBIN (HGB A1C): Hemoglobin A1C: 6.4

## 2016-02-27 MED ORDER — CHOLECALCIFEROL 10 MCG (400 UNIT) PO CAPS: 400.0000 [IU] | ORAL_CAPSULE | Freq: Every day | ORAL | Status: DC

## 2016-02-27 MED ORDER — CALCIUM CARBONATE-VITAMIN D 500-200 MG-UNIT PO TABS: 2.0000 | ORAL_TABLET | Freq: Every day | ORAL | Status: AC

## 2016-02-27 NOTE — Progress Notes (Signed)
  Patient name: Kimberly Carlson MRN [id number]  Date of birth: 02-06-1946  CC & HPI:  Kimberly Carlson is a 70 y.o. female presenting today for Lower back pain, cough.   Lower back pain - Reports lower back pain for several years.  Denies any previous surgeries or trauma.  Reports episodes of worsening pain associated to increased activity.  - Denies lower extremity weakness, numbness, bowel or bladder incontinence / retention - no hx of CA - takes tylenol with minimal relief   Cough - Reports URI symptoms 1-2 weeks ago that have resolved, but continues to have persistent cough, worse at night when lying down - Denies fevers, chills, chest pain, shortness of breath - On lisinopril but no cough prior to URI - Denies heartburn or reflux - Denies nasal congestion, rhinitis - Previous smoker  Smoking History Noted  Objective Findings:  Vitals: BP 182/67 mmHg  Pulse 72  Temp(Src) 98.2 F (36.8 C) (Oral)  Ht 5\' 3"  (1.6 m)  Wt 196 lb 8 oz (89.132 kg)  BMI 34.82 kg/m2  Gen: NAD CV: RRR w/o m/r/g, pulses +2 b/l Resp: CTAB w/ normal respiratory effort Back: Normal skin w/o rash, Spine with normal alignment and no deformity. No tenderness to vertebral process palpation.  Paraspinous muscles are non tender and without spasm.   Range of motion is full at lumbar sacral regions.  Negative straight leg raise bilaterally.  Negative left cross leg raise.  Bilateral lower extremity strength and sensation intact  Assessment & Plan:   Low back pain Chronic; no acute trauma or worsening.  No signs of neurovascular compromise.  - Referral to physical therapy - Continue heating pad and stretching as needed - Continue Tylenol 1000 mg every 8 hours when necessary - Follow-up in one month; consider imaging at that time given history of herniated disk  Cough Most likely post-viral.  No current symptoms of URI, GERD.  History of smoking - Symptomatic treatment for 1-2 weeks; advised follow-up  that have not resolved by then - Recommend low-dose CT at follow-up.  Given her 30-pack-year smoking history and smokes within past 15 years

## 2016-02-27 NOTE — Assessment & Plan Note (Signed)
Most likely post-viral.  No current symptoms of URI, GERD.  History of smoking - Symptomatic treatment for 1-2 weeks; advised follow-up that have not resolved by then - Recommend low-dose CT at follow-up.  Given her 30-pack-year smoking history and smokes within past 15 years

## 2016-02-27 NOTE — Assessment & Plan Note (Signed)
Chronic; no acute trauma or worsening.  No signs of neurovascular compromise.  - Referral to physical therapy - Continue heating pad and stretching as needed - Continue Tylenol 1000 mg every 8 hours when necessary - Follow-up in one month; consider imaging at that time given history of herniated disk

## 2016-02-27 NOTE — Patient Instructions (Signed)
It was great seeing you today.   1. You're coughing is most likely related to your recent virus, and should resolve in the next 1-2 weeks.  Call if he would like to try the nose spray or if you continue to cough past 2-3 weeks.  2. I referred you to physical therapy for your low back pain and knee pain.  3. Take calcium 1000 mg daily 4. Take vitamin D 800 units daily 5. Call and schedule mammogram at your convenience 6. Continue to follow-up with your blood pressure doctor   Please bring all your medications to every doctors visit  Sign up for My Chart to have easy access to your labs results, and communication with your Primary care physician.  Next Appointment  Please make an appointment with Dr Berkley Harvey in 1 month for back pain    I look forward to talking with you again at our next visit. If you have any questions or concerns before then, please call the clinic at [phone].  Take Care,   Dr Phill Myron

## 2016-02-28 ENCOUNTER — Encounter: Payer: Self-pay | Admitting: Family Medicine

## 2016-02-28 DIAGNOSIS — E559 Vitamin D deficiency, unspecified: Secondary | ICD-10-CM | POA: Insufficient documentation

## 2016-02-28 LAB — VITAMIN D 25 HYDROXY (VIT D DEFICIENCY, FRACTURES): Vit D, 25-Hydroxy: 13 ng/mL — ABNORMAL LOW (ref 30–100)

## 2016-03-05 ENCOUNTER — Other Ambulatory Visit: Payer: Self-pay | Admitting: *Deleted

## 2016-03-05 DIAGNOSIS — E1165 Type 2 diabetes mellitus with hyperglycemia: Secondary | ICD-10-CM

## 2016-03-05 DIAGNOSIS — Z794 Long term (current) use of insulin: Principal | ICD-10-CM

## 2016-03-05 MED ORDER — INSULIN GLARGINE 100 UNIT/ML ~~LOC~~ SOLN: 28.0000 [IU] | Freq: Every day | SUBCUTANEOUS | Status: DC

## 2016-03-12 ENCOUNTER — Other Ambulatory Visit: Payer: Self-pay | Admitting: *Deleted

## 2016-03-12 DIAGNOSIS — E7800 Pure hypercholesterolemia, unspecified: Secondary | ICD-10-CM

## 2016-03-13 MED ORDER — ATORVASTATIN CALCIUM 40 MG PO TABS: 40.0000 mg | ORAL_TABLET | Freq: Every day | ORAL | Status: DC

## 2016-03-29 ENCOUNTER — Ambulatory Visit (INDEPENDENT_AMBULATORY_CARE_PROVIDER_SITE_OTHER): Payer: Medicare Other | Admitting: Family Medicine

## 2016-03-29 ENCOUNTER — Encounter: Payer: Self-pay | Admitting: Family Medicine

## 2016-03-29 VITALS — BP 176/65 | HR 76 | Ht 63.0 in | Wt 200.0 lb

## 2016-03-29 DIAGNOSIS — Z794 Long term (current) use of insulin: Secondary | ICD-10-CM

## 2016-03-29 DIAGNOSIS — N2581 Secondary hyperparathyroidism of renal origin: Secondary | ICD-10-CM | POA: Diagnosis not present

## 2016-03-29 DIAGNOSIS — E7800 Pure hypercholesterolemia, unspecified: Secondary | ICD-10-CM

## 2016-03-29 DIAGNOSIS — I10 Essential (primary) hypertension: Secondary | ICD-10-CM | POA: Diagnosis not present

## 2016-03-29 DIAGNOSIS — N183 Chronic kidney disease, stage 3 unspecified: Secondary | ICD-10-CM

## 2016-03-29 DIAGNOSIS — E1122 Type 2 diabetes mellitus with diabetic chronic kidney disease: Secondary | ICD-10-CM | POA: Diagnosis present

## 2016-03-29 NOTE — Assessment & Plan Note (Signed)
Home blood glucose is under good control.  - Continue Lantus 28 units daily; she is concerned about weight gain reports 5 pounds in the past several months.  Long discussion today about decreasing caloric intake.  - Recommended meeting with a nutritionist and provided contact information - she is considering - Follow-up in July for repeat A1c - She continues to refuse flu and pneumonia vaccines

## 2016-03-29 NOTE — Patient Instructions (Addendum)
Your blood pressure and diabetes are under good control.  - Continue to work on eating less carbohydrates and sweets as this well help prevent weight gain  - Consider make an appointment with the nutritionist and myself on 5/18 or 5/25 if so: Please call [phone] to schedule an appointment with Nutritionist: Iver Nestle   Please call and schedule your colonoscopy and mammogram at your convenience  - Please have your ophthalmologist in this your records  - Please have your kidney doctor suggests your records and labs

## 2016-03-29 NOTE — Progress Notes (Signed)
  Patient name: Kimberly Carlson MRN [id number]  Date of birth: [birth year]  CC & HPI:  Kimberly Carlson is a 70 y.o. female presenting today for DM, HTN, and HLD.   DIABETES  Lab Results  Component Value Date   HGBA1C 6.4 02/27/2016    Blood Sugar Ranges: 100-200s; mostly ~ 150  Symptoms of Hypoglycemia? no  Comorbid Symptoms:  no Neuropathy: yes Vision problems; cataracts, followed up with ophthalmology  Medication Side Effects: Weight gain with lantus  CHRONIC HYPERTENSION BP Readings from Last 3 Encounters:  03/29/16 176/65  02/27/16 182/67  12/08/15 180/71    Disease Monitoring  Blood pressure range outside clinc: Checks daily; reports home BPs 120-130/80s  Chest pain: no   Dyspnea: no   Claudication: no  Medication Side Effects: No cough, angioedema,lightheadedness, rash   HLD Lab Results  Component Value Date   LDLCALC 120* 06/21/2015    Medication Compliance: yes  Side Effects?: no muscle pain or weakness, RUQ pain; jaundice  Medication compliance: yes   Preventitive Healthcare:  Smoking: No    Eye Exam: Reports 02/06/16; diabetic retinopathy or glaucoma, reports cataracts Foot Exam: done today Vaccinations reveiwed: yes; she continues to refuse flu and pneumonia vaccines  Smoking History Noted  Objective Findings:  Vitals: BP 176/65 mmHg  Pulse 76  Ht 5\' 3"  (1.6 m)  Wt 200 lb (90.719 kg)  BMI 35.44 kg/m2  SpO2 99%  Gen: NAD CV: RRR w/o m/r/g, pulses +2 b/l Resp: CTAB w/ normal respiratory effort Foot Exam: Pulses 2+; No Ulcers, bruises or cuts; Monofilament testing: Sensation intact b/l   Assessment & Plan:   DM2 (diabetes mellitus, type 2) ([place]) Home blood glucose is under good control.  - Continue Lantus 28 units daily; she is concerned about weight gain reports 5 pounds in the past several months.  Long discussion today about decreasing caloric intake.  - Recommended meeting with a nutritionist and provided contact information - she  is considering - Follow-up in July for repeat A1c - She continues to refuse flu and pneumonia vaccines  HYPERTENSION, BENIGN SYSTEMIC BP elevated in clinic.  However, home blood pressure readings at goal.  - Has follow-up with nephrologist on 5/9 - Recommended she bring all medications to that appointment to discuss her calcium and vitamin D  HYPERCHOLESTEROLEMIA Last LDL < 100 - Continue lipitor 40mg  qhs

## 2016-03-29 NOTE — Assessment & Plan Note (Signed)
BP elevated in clinic.  However, home blood pressure readings at goal.  - Has follow-up with nephrologist on 5/9 - Recommended she bring all medications to that appointment to discuss her calcium and vitamin D

## 2016-03-29 NOTE — Assessment & Plan Note (Signed)
Last LDL < 100 - Continue lipitor 40mg  qhs

## 2016-04-03 DIAGNOSIS — N2581 Secondary hyperparathyroidism of renal origin: Secondary | ICD-10-CM | POA: Diagnosis not present

## 2016-04-03 DIAGNOSIS — N183 Chronic kidney disease, stage 3 (moderate): Secondary | ICD-10-CM | POA: Diagnosis not present

## 2016-04-03 DIAGNOSIS — I10 Essential (primary) hypertension: Secondary | ICD-10-CM | POA: Diagnosis not present

## 2016-04-20 ENCOUNTER — Encounter: Payer: Self-pay | Admitting: Family Medicine

## 2016-04-27 ENCOUNTER — Other Ambulatory Visit: Payer: Self-pay | Admitting: Family Medicine

## 2016-04-27 DIAGNOSIS — I10 Essential (primary) hypertension: Secondary | ICD-10-CM

## 2016-05-01 MED ORDER — HYDROCHLOROTHIAZIDE 25 MG PO TABS: 25.0000 mg | ORAL_TABLET | Freq: Every day | ORAL | Status: DC

## 2016-05-01 NOTE — Addendum Note (Signed)
Addended by: Olam Idler on: 05/01/2016 12:40 PM   Modules accepted: Orders

## 2016-05-16 ENCOUNTER — Other Ambulatory Visit: Payer: Self-pay | Admitting: Family Medicine

## 2016-05-16 DIAGNOSIS — I10 Essential (primary) hypertension: Secondary | ICD-10-CM

## 2016-05-16 MED ORDER — HYDROCHLOROTHIAZIDE 25 MG PO TABS: 25.0000 mg | ORAL_TABLET | Freq: Every day | ORAL | Status: DC

## 2016-06-09 ENCOUNTER — Other Ambulatory Visit: Payer: Self-pay | Admitting: Family Medicine

## 2016-06-09 DIAGNOSIS — I10 Essential (primary) hypertension: Secondary | ICD-10-CM

## 2016-06-11 ENCOUNTER — Other Ambulatory Visit: Payer: Self-pay | Admitting: *Deleted

## 2016-06-11 ENCOUNTER — Other Ambulatory Visit: Payer: Self-pay | Admitting: Internal Medicine

## 2016-06-11 DIAGNOSIS — I10 Essential (primary) hypertension: Secondary | ICD-10-CM

## 2016-06-11 MED ORDER — "INSULIN SYRINGE 31G X 5/16"" 1 ML MISC": 1.0000 | Status: DC

## 2016-06-11 MED ORDER — HYDROCHLOROTHIAZIDE 25 MG PO TABS: 25.0000 mg | ORAL_TABLET | Freq: Every day | ORAL | Status: DC

## 2016-06-21 ENCOUNTER — Other Ambulatory Visit: Payer: Self-pay | Admitting: Family Medicine

## 2016-06-21 DIAGNOSIS — E1165 Type 2 diabetes mellitus with hyperglycemia: Secondary | ICD-10-CM

## 2016-06-21 DIAGNOSIS — Z794 Long term (current) use of insulin: Principal | ICD-10-CM

## 2016-07-23 ENCOUNTER — Other Ambulatory Visit: Payer: Self-pay | Admitting: Family Medicine

## 2016-07-23 DIAGNOSIS — I10 Essential (primary) hypertension: Secondary | ICD-10-CM

## 2016-09-30 ENCOUNTER — Other Ambulatory Visit: Payer: Self-pay | Admitting: Internal Medicine

## 2016-09-30 DIAGNOSIS — I10 Essential (primary) hypertension: Secondary | ICD-10-CM

## 2016-10-01 ENCOUNTER — Encounter: Payer: Self-pay | Admitting: Internal Medicine

## 2016-10-02 ENCOUNTER — Other Ambulatory Visit: Payer: Self-pay | Admitting: Internal Medicine

## 2016-10-02 MED ORDER — GLUCOSE BLOOD VI STRP: ORAL_STRIP | 3 refills | Status: DC

## 2016-10-05 ENCOUNTER — Other Ambulatory Visit: Payer: Self-pay | Admitting: Internal Medicine

## 2016-10-05 DIAGNOSIS — E1165 Type 2 diabetes mellitus with hyperglycemia: Secondary | ICD-10-CM

## 2016-10-05 DIAGNOSIS — Z794 Long term (current) use of insulin: Principal | ICD-10-CM

## 2016-10-10 ENCOUNTER — Observation Stay (HOSPITAL_COMMUNITY)
Admission: EM | Admit: 2016-10-10 | Discharge: 2016-10-11 | Disposition: A | Discharge status: Home / Self Care | Payer: Medicare Other | Attending: Family Medicine | Admitting: Family Medicine

## 2016-10-10 ENCOUNTER — Encounter (HOSPITAL_COMMUNITY): Payer: Self-pay | Admitting: Emergency Medicine

## 2016-10-10 ENCOUNTER — Ambulatory Visit (INDEPENDENT_AMBULATORY_CARE_PROVIDER_SITE_OTHER)
Admission: EM | Admit: 2016-10-10 | Discharge: 2016-10-10 | Disposition: A | Discharge status: Nursing Home (Includes ICF) | Payer: Medicare Other | Source: Home / Self Care | Attending: Emergency Medicine | Admitting: Emergency Medicine

## 2016-10-10 ENCOUNTER — Emergency Department (HOSPITAL_COMMUNITY): Payer: Medicare Other

## 2016-10-10 DIAGNOSIS — M1990 Unspecified osteoarthritis, unspecified site: Secondary | ICD-10-CM | POA: Insufficient documentation

## 2016-10-10 DIAGNOSIS — M810 Age-related osteoporosis without current pathological fracture: Secondary | ICD-10-CM | POA: Diagnosis not present

## 2016-10-10 DIAGNOSIS — J189 Pneumonia, unspecified organism: Secondary | ICD-10-CM | POA: Diagnosis not present

## 2016-10-10 DIAGNOSIS — Z794 Long term (current) use of insulin: Secondary | ICD-10-CM | POA: Diagnosis not present

## 2016-10-10 DIAGNOSIS — Z79899 Other long term (current) drug therapy: Secondary | ICD-10-CM | POA: Diagnosis not present

## 2016-10-10 DIAGNOSIS — D649 Anemia, unspecified: Secondary | ICD-10-CM | POA: Insufficient documentation

## 2016-10-10 DIAGNOSIS — Z87891 Personal history of nicotine dependence: Secondary | ICD-10-CM | POA: Insufficient documentation

## 2016-10-10 DIAGNOSIS — E119 Type 2 diabetes mellitus without complications: Secondary | ICD-10-CM | POA: Diagnosis not present

## 2016-10-10 DIAGNOSIS — R079 Chest pain, unspecified: Secondary | ICD-10-CM | POA: Diagnosis not present

## 2016-10-10 DIAGNOSIS — G8929 Other chronic pain: Secondary | ICD-10-CM | POA: Insufficient documentation

## 2016-10-10 DIAGNOSIS — N179 Acute kidney failure, unspecified: Secondary | ICD-10-CM | POA: Insufficient documentation

## 2016-10-10 DIAGNOSIS — E7800 Pure hypercholesterolemia, unspecified: Secondary | ICD-10-CM | POA: Insufficient documentation

## 2016-10-10 DIAGNOSIS — J181 Lobar pneumonia, unspecified organism: Secondary | ICD-10-CM | POA: Diagnosis present

## 2016-10-10 DIAGNOSIS — I10 Essential (primary) hypertension: Secondary | ICD-10-CM | POA: Insufficient documentation

## 2016-10-10 DIAGNOSIS — Z7982 Long term (current) use of aspirin: Secondary | ICD-10-CM | POA: Diagnosis not present

## 2016-10-10 DIAGNOSIS — R05 Cough: Secondary | ICD-10-CM

## 2016-10-10 DIAGNOSIS — M549 Dorsalgia, unspecified: Secondary | ICD-10-CM | POA: Diagnosis not present

## 2016-10-10 DIAGNOSIS — R059 Cough, unspecified: Secondary | ICD-10-CM

## 2016-10-10 LAB — BASIC METABOLIC PANEL
Anion gap: 9 (ref 5–15)
BUN: 38 mg/dL — ABNORMAL HIGH (ref 6–20)
CO2: 21 mmol/L — ABNORMAL LOW (ref 22–32)
Calcium: 8.8 mg/dL — ABNORMAL LOW (ref 8.9–10.3)
Chloride: 105 mmol/L (ref 101–111)
Creatinine, Ser: 1.88 mg/dL — ABNORMAL HIGH (ref 0.44–1.00)
GFR calc Af Amer: 30 mL/min — ABNORMAL LOW (ref >60)
GFR calc non Af Amer: 26 mL/min — ABNORMAL LOW (ref >60)
Glucose, Bld: 184 mg/dL — ABNORMAL HIGH (ref 65–99)
Potassium: 3.9 mmol/L (ref 3.5–5.1)
Sodium: 135 mmol/L (ref 135–145)

## 2016-10-10 LAB — HEPATIC FUNCTION PANEL
ALT: 24 U/L (ref 14–54)
AST: 22 U/L (ref 15–41)
Albumin: 3 g/dL — ABNORMAL LOW (ref 3.5–5.0)
Alkaline Phosphatase: 83 U/L (ref 38–126)
Bilirubin, Direct: 0.1 mg/dL (ref 0.1–0.5)
Indirect Bilirubin: 0.5 mg/dL (ref 0.3–0.9)
Total Bilirubin: 0.6 mg/dL (ref 0.3–1.2)
Total Protein: 6.4 g/dL — ABNORMAL LOW (ref 6.5–8.1)

## 2016-10-10 LAB — URINE MICROSCOPIC-ADD ON

## 2016-10-10 LAB — URINALYSIS, ROUTINE W REFLEX MICROSCOPIC
Bilirubin Urine: NEGATIVE
Glucose, UA: NEGATIVE mg/dL
Ketones, ur: NEGATIVE mg/dL
Nitrite: NEGATIVE
Protein, ur: >300 mg/dL — AB
Specific Gravity, Urine: 1.011 (ref 1.005–1.030)
pH: 5 (ref 5.0–8.0)

## 2016-10-10 LAB — CBC
HCT: 28.6 % — ABNORMAL LOW (ref 36.0–46.0)
Hemoglobin: 9.6 g/dL — ABNORMAL LOW (ref 12.0–15.0)
MCH: 30.2 pg (ref 26.0–34.0)
MCHC: 33.6 g/dL (ref 30.0–36.0)
MCV: 89.9 fL (ref 78.0–100.0)
Platelets: 178 10*3/uL (ref 150–400)
RBC: 3.18 MIL/uL — ABNORMAL LOW (ref 3.87–5.11)
RDW: 11.9 % (ref 11.5–15.5)
WBC: 19.9 10*3/uL — ABNORMAL HIGH (ref 4.0–10.5)

## 2016-10-10 LAB — I-STAT CG4 LACTIC ACID, ED: Lactic Acid, Venous: 0.78 mmol/L (ref 0.5–1.9)

## 2016-10-10 LAB — I-STAT TROPONIN, ED: Troponin i, poc: 0.01 ng/mL (ref 0.00–0.08)

## 2016-10-10 LAB — CBG MONITORING, ED: Glucose-Capillary: 352 mg/dL — ABNORMAL HIGH (ref 65–99)

## 2016-10-10 MED ORDER — ACETAMINOPHEN 325 MG PO TABS
650.0000 mg | ORAL_TABLET | Freq: Once | ORAL | Status: AC
  Administered 2016-10-10: 650 mg via ORAL

## 2016-10-10 MED ORDER — DEXTROSE 5 % IV SOLN
1.0000 g | Freq: Once | INTRAVENOUS | Status: AC
  Administered 2016-10-10: 1 g via INTRAVENOUS
  Filled 2016-10-10: qty 10

## 2016-10-10 MED ORDER — ATORVASTATIN CALCIUM 40 MG PO TABS: 40.0000 mg | ORAL_TABLET | Freq: Every day | ORAL | Status: DC

## 2016-10-10 MED ORDER — CARVEDILOL 12.5 MG PO TABS
25.0000 mg | ORAL_TABLET | Freq: Two times a day (BID) | ORAL | Status: DC
  Administered 2016-10-11: 25 mg via ORAL
  Filled 2016-10-10: qty 2

## 2016-10-10 MED ORDER — ENOXAPARIN SODIUM 30 MG/0.3ML ~~LOC~~ SOLN
30.0000 mg | SUBCUTANEOUS | Status: DC
  Administered 2016-10-11: 30 mg via SUBCUTANEOUS
  Filled 2016-10-10 (×2): qty 0.3

## 2016-10-10 MED ORDER — AZITHROMYCIN 250 MG PO TABS: 500.0000 mg | ORAL_TABLET | Freq: Every day | ORAL | Status: DC

## 2016-10-10 MED ORDER — AZITHROMYCIN 250 MG PO TABS: 250.0000 mg | ORAL_TABLET | Freq: Every day | ORAL | Status: DC

## 2016-10-10 MED ORDER — CHOLECALCIFEROL 10 MCG (400 UNIT) PO TABS
400.0000 [IU] | ORAL_TABLET | Freq: Every day | ORAL | Status: DC
  Filled 2016-10-10: qty 1

## 2016-10-10 MED ORDER — SODIUM CHLORIDE 0.9 % IV SOLN: INTRAVENOUS | Status: DC

## 2016-10-10 MED ORDER — ACETAMINOPHEN 325 MG PO TABS
ORAL_TABLET | ORAL | Status: AC
  Filled 2016-10-10: qty 2

## 2016-10-10 MED ORDER — CALCIUM CARBONATE-VITAMIN D 500-200 MG-UNIT PO TABS
2.0000 | ORAL_TABLET | Freq: Every day | ORAL | Status: DC
  Administered 2016-10-11: 2 via ORAL
  Filled 2016-10-10 (×2): qty 2

## 2016-10-10 MED ORDER — INSULIN ASPART 100 UNIT/ML ~~LOC~~ SOLN
0.0000 [IU] | Freq: Three times a day (TID) | SUBCUTANEOUS | Status: DC
  Administered 2016-10-11: 2 [IU] via SUBCUTANEOUS

## 2016-10-10 MED ORDER — DEXTROSE 5 % IV SOLN
500.0000 mg | Freq: Once | INTRAVENOUS | Status: DC
  Administered 2016-10-10: 500 mg via INTRAVENOUS
  Filled 2016-10-10: qty 500

## 2016-10-10 MED ORDER — INSULIN GLARGINE 100 UNIT/ML ~~LOC~~ SOLN
14.0000 [IU] | Freq: Every day | SUBCUTANEOUS | Status: DC
  Administered 2016-10-11: 14 [IU] via SUBCUTANEOUS
  Filled 2016-10-10 (×2): qty 0.14

## 2016-10-10 MED ORDER — SODIUM CHLORIDE 0.9 % IV SOLN
INTRAVENOUS | Status: AC
  Administered 2016-10-10 – 2016-10-11 (×2): via INTRAVENOUS

## 2016-10-10 MED ORDER — INSULIN ASPART 100 UNIT/ML ~~LOC~~ SOLN
0.0000 [IU] | Freq: Every day | SUBCUTANEOUS | Status: DC
  Administered 2016-10-10: 5 [IU] via SUBCUTANEOUS
  Filled 2016-10-10: qty 1

## 2016-10-10 MED ORDER — SODIUM CHLORIDE 0.9 % IV BOLUS (SEPSIS)
500.0000 mL | Freq: Once | INTRAVENOUS | Status: AC
  Administered 2016-10-10: 500 mL via INTRAVENOUS

## 2016-10-10 MED ORDER — ASPIRIN 81 MG PO CHEW
81.0000 mg | CHEWABLE_TABLET | Freq: Every day | ORAL | Status: DC
  Administered 2016-10-11: 81 mg via ORAL
  Filled 2016-10-10: qty 1

## 2016-10-10 MED ORDER — AZITHROMYCIN 500 MG PO TABS: 500.0000 mg | ORAL_TABLET | Freq: Every day | ORAL | Status: DC

## 2016-10-10 NOTE — H&P (Signed)
[facility] Admission History and Physical Service Pager: [phone]  Patient name: Kimberly Carlson Medical record number: [id number] Date of birth: Feb 25, 1946 Age: 70 y.o. Gender: female  Primary Care Provider: Evette Doffing, MD Consultants: None Code Status: Full (confimred on admission)  Chief Complaint: Cough  Assessment and Plan: Kimberly Carlson is a 70 y.o. female presenting with fevers, productive cough, and dyspnea . PMH is significant for HTN, HLD, DMT2, OA.  #Community Acquired Pneumonia, Acute, Stable: Patient presenting with productive cough. Denies CP on presentation. Troponin neg. CXR showed pneumonic consolidation in the right lung base medially. Lactic acid neg. BCx, and UA obtained. Febrile to 102.9 on admission.  Leukocytosis 19.9, BUN 38, normal respiratory effort with BP at 150/50-70s on admission. No h/o asthma or lung disease. Short smoking hx but stopped 1 year ago. Clinical picture consistent with CAP given fevers, productive cough, and dyspnea. Meets sepsis criteria. qSOFA score 0. Given age, meets 3 points CURB-65 for severe criteria. Will provide abx, fluids, and monitor for improvement of respiratory status. Patient on RA without need for O2. PE unlikely given hx. H/o hemoptysis, may be bronchitis. --Admit to obs for suspected CAP, attending Dr. Nori Riis --Given Rocephin IV 1 g in ED, will continue for now, consider transition to oral beta lactam if improving clinically.  -- Will initiate Azithromycin 500 mg x1 day followed by x4 days 250 mg for CAP --F/u CBC in AM --BCx and UA pending --Sputum Cx pending --Strep urine AG pending --Legionella urine Ag pending  #Acute Kidney Injury, Stable: Creatinine 1.8 on admission. Baseline 1.1-1.2. Patient has decreased PO intake with nausea in the context of CAP. --IVF NS @125cc /hr for x12 hrs --F/u BMET in AM  #Hypertension, Chronic, Stable: BP 150/50-70s on admission. Takes BB, thiazide,  ACE-I, and K-sparing for BP control. Last ECHO on 11/2015 showed LVEF 65-70%, normal wall motion, G1DD. Renal U/S neg for renal artery stenosis on 11/2015. EKG NSR on admission. Former 5 pack year smoker, quit 1 year ago. --Coreg 25 mg BID --Holding home HCTZ, Lisinopril, and Spironolactone given AKI  #Hyperlipidemia, Chronic, Stable: Lipid panel 05/2015: tot cholesterol elevated 216, TGY 51, HDL 86, elevated LDL 120. --Atorvastatin 40 mg QD --ASA 81 QD  #Osteoporosis / Osteoarthritis, Chronic, Stable: Last Vit D, 25-Hydroxy was 13 on 02/2016. --Cholecalciferol 400 U QD --Calcium-vitamin D 500-200 mg-U BID  #Diabetes Mellitus Type 2, Chronic, Stable: Last A1c 6.4 02/2016. CBG 184 on admission.  Does endorse some decrease in appetite over last 24 hours. On lantus 28U daily at home.  --Lantus 14 U QD --Moderate SSI --Monitor CBGs  FEN/GI: Carb modified diet Prophylaxis: Lovenox SQ renally dosed  Disposition: Pending improvement of suspected CAP, anticipate home 11/16 if symptoms improve  History of Present Illness:  Kimberly Carlson is a 70 y.o. female presenting with cough.  Patient presents with cough and shortness of breath for the past 4 days. Also reports some chest pain that she relates to her frequent cough. Cough has been productive of blood tinged sputum. Also with a fever to 102.43F. Symptoms are worse with movement. Decreased appetite over the last couple of days. Also weaker than normal. Has been able to keep down fluids. She went to urgent care today who directed her to go to the ED.   Review Of Systems: Complete ROS performed, see HPI for pertinent.  Patient Active Problem List   Diagnosis Date Noted  . Hypovitaminosis D 02/28/2016  . Low back pain 02/27/2016  .  Cough 02/27/2016  . Cataract 02/27/2016  . Neck pain on left side 12/08/2015  . Edema of both legs 08/11/2015  . Anemia 08/10/2015  . Hematuria, undiagnosed cause 06/09/2015  . Headache 06/08/2015  .  Hypertensive urgency 06/08/2015  . Cocaine use 06/08/2015  . Noncompliance with medications   . Knee pain, bilateral 03/24/2014  . UTI (urinary tract infection) 05/27/2013  . Resting tremor 05/07/2012  . DIPLOPIA 11/28/2010  . DEGENERATIVE DISC DISEASE 11/09/2009  . History of tobacco use 09/03/2009  . Obesity 07/08/2009  . HERNIATED LUMBAR DISC 05/14/2007  . DM2 (diabetes mellitus, type 2) ([place]) 01/23/2007  . HYPERCHOLESTEROLEMIA 01/23/2007  . HYPERTENSION, BENIGN SYSTEMIC 01/23/2007    Past Medical History: Past Medical History:  Diagnosis Date  . Back pain    herniated disc lower back  . Diabetes mellitus without complication ([place])   . Hypertension     Past Surgical History: Past Surgical History:  Procedure Laterality Date  . ABDOMINAL HYSTERECTOMY      Social History: Social History  Substance Use Topics  . Smoking status: Former Smoker    Packs/day: 1.00    Types: Cigarettes    Start date: 04/26/2014    Quit date: 07/23/2015  . Smokeless tobacco: Never Used     Comment: Started in her 56's with multiple quit attempts for about 1 year at a time.  . Alcohol use No   Please also refer to relevant sections of EMR.  Family History: Family History  Problem Relation Age of Onset  . Adopted: Yes   Allergies and Medications: Allergies  Allergen Reactions  . Amlodipine Swelling    Bilateral leg swelling with multiple doses including 2.5mg  daily.  Stopped therapy and edema improved.   . Clonidine Derivatives Other (See Comments)    Light-headedness, dizzy, extreme dry mouth  . Metformin Diarrhea   No current facility-administered medications on file prior to encounter.    Current Outpatient Prescriptions on File Prior to Encounter  Medication Sig Dispense Refill  . aspirin (ASPIRIN CHILDRENS) 81 MG chewable tablet Chew 81 mg by mouth daily.      Marland Kitchen atorvastatin (LIPITOR) 40 MG tablet Take 1 tablet (40 mg total) by mouth daily at 6 PM. 90 tablet 3  .  calcium-vitamin D (OSCAL 500/200 D-3) 500-200 MG-UNIT tablet Take 2 tablets by mouth daily with breakfast. 180 tablet 3  . carvedilol (COREG) 25 MG tablet take 1 tablet by mouth twice a day with meals 60 tablet 1  . Cholecalciferol ([employer]S VITAMIN D3) 400 units CAPS Take 1 capsule (400 Units total) by mouth daily. 90 capsule 3  . glucose blood (ONE TOUCH ULTRA TEST) test strip Check sugar 6 x daily 200 each 3  . hydrochlorothiazide (HYDRODIURIL) 25 MG tablet Take 1 tablet (25 mg total) by mouth daily. 90 tablet 0  . Insulin Syringe-Needle U-100 (INSULIN SYRINGE 1CC/31GX5/16") 31G X 5/16" 1 ML MISC 1 each by Does not apply route See admin instructions. Use daily with insulin. 100 each 11  . LANTUS 100 UNIT/ML injection INJECT 28 UNITS DAILY 10 vial 2  . lisinopril (PRINIVIL,ZESTRIL) 40 MG tablet Take 1 tablet (40 mg total) by mouth daily. 90 tablet 3  . ONE TOUCH LANCETS MISC 1 each by Does not apply route See admin instructions. Check blood sugar once daily. 200 each 11  . spironolactone (ALDACTONE) 25 MG tablet Take 1 tablet (25 mg total) by mouth daily. 32 tablet 2  . [DISCONTINUED] simvastatin (ZOCOR) 80 MG tablet take 1  tablet by mouth at bedtime 90 tablet 0    Objective: BP 158/57   Pulse 81   Temp 99.1 F (37.3 C) (Oral)   Resp 16   SpO2 98%  Exam: General: well nourished, well developed, in no acute distress with non-toxic appearance HEENT: normocephalic, atraumatic, moist mucous membranes CV: regular rate and rhythm without murmurs, rubs, or gallops Lungs: Transmitted upper airway sounds noted throughout all fields. Normal work of breathing Abdomen: soft, non-tender, normoactive bowel sounds Skin: warm, dry, no rashes or lesions, cap refill < 2 seconds Extremities: warm and well perfused, normal tone. 1 to 2+ edema to mid shins bilaterally.  Labs and Imaging: CBC BMET   Recent Labs Lab 10/10/16 2013  WBC 19.9*  HGB 9.6*  HCT 28.6*  PLT 178    Recent Labs Lab  10/10/16 2013  NA 135  K 3.9  CL 105  CO2 21*  BUN 38*  CREATININE 1.88*  GLUCOSE 184*  CALCIUM 8.8*     DG Chest 2 View (10/10/16) FINDINGS: The heart size and mediastinal contours are within normal limits. Faint right pneumonic consolidation at the right lung base is noted on today's study. The visualized skeletal structures are unremarkable.  IMPRESSION: Faint pneumonic consolidation in the right lung base medially.  [place] Bing, DO 10/10/2016, 10:03 PM PGY-1, [place] Intern pager: [phone], text pages welcome  UPPER LEVEL ADDENDUM  I have read the above note and made revisions highlighted in blue.  Algis Greenhouse. Jerline Pain, [place] Resident PGY-3 10/11/2016 6:55 AM

## 2016-10-10 NOTE — ED Triage Notes (Addendum)
Pt sent from [facility]>  brought in by granddaughter- reports sob, aching pain to center of chest,  and productive cough with dark yellow phlegm streaked with blood x 4 days.   Also reports fever, wheezing, and decreased appetite.  Poor historian.

## 2016-10-10 NOTE — ED Provider Notes (Signed)
[facility] DEPT Provider Note   CSN: [id number] Arrival date & time: 10/10/16  1900     History   Chief Complaint Chief Complaint  Patient presents with  . Cough  . Chest Pain    HPI Kimberly Carlson is a 70 y.o. female.  Patient with a history of DM, HTN, chronic back pain presents with cough, fever, nausea without vomiting, decreased appetite for the past 3 days. She reports her cough has been productive of sputum that is blood tinged. She becomes DOE, but no SOB at rest. She denies significant chest pain. Per her granddaughter at bedside, there has been no confusion or lethargy. Granddaughter states "she hasn't been herself" and clarifies that she hasn't been as active as usual.    The history is provided by the patient. No language interpreter was used.  Cough  Associated symptoms include chest pain. Pertinent negatives include no chills and no myalgias.  Chest Pain   Associated symptoms include back pain (Chronic, unchanged), cough, a fever and nausea. Pertinent negatives include no abdominal pain, no vomiting and no weakness.    Past Medical History:  Diagnosis Date  . Back pain    herniated disc lower back  . Diabetes mellitus without complication ([place])   . Hypertension     Patient Active Problem List   Diagnosis Date Noted  . Community acquired pneumonia 10/10/2016  . Hypovitaminosis D 02/28/2016  . Low back pain 02/27/2016  . Cough 02/27/2016  . Cataract 02/27/2016  . Neck pain on left side 12/08/2015  . Edema of both legs 08/11/2015  . Anemia 08/10/2015  . Hematuria, undiagnosed cause 06/09/2015  . Headache 06/08/2015  . Hypertensive urgency 06/08/2015  . Cocaine use 06/08/2015  . Noncompliance with medications   . Knee pain, bilateral 03/24/2014  . UTI (urinary tract infection) 05/27/2013  . Resting tremor 05/07/2012  . DIPLOPIA 11/28/2010  . DEGENERATIVE DISC DISEASE 11/09/2009  . History of tobacco use 09/03/2009  . Obesity 07/08/2009  .  HERNIATED LUMBAR DISC 05/14/2007  . DM2 (diabetes mellitus, type 2) ([place]) 01/23/2007  . HYPERCHOLESTEROLEMIA 01/23/2007  . HYPERTENSION, BENIGN SYSTEMIC 01/23/2007    Past Surgical History:  Procedure Laterality Date  . ABDOMINAL HYSTERECTOMY      OB History    No data available       Home Medications    Prior to Admission medications   Medication Sig Start Date End Date Taking? Authorizing Provider  aspirin (ASPIRIN CHILDRENS) 81 MG chewable tablet Chew 81 mg by mouth daily.      Historical Provider, MD  atorvastatin (LIPITOR) 40 MG tablet Take 1 tablet (40 mg total) by mouth daily at 6 PM. 03/13/16   Olam Idler, MD  calcium-vitamin D (OSCAL 500/200 D-3) 500-200 MG-UNIT tablet Take 2 tablets by mouth daily with breakfast. 02/27/16   Olam Idler, MD  carvedilol (COREG) 25 MG tablet take 1 tablet by mouth twice a day with meals 10/01/16   Sela Hua, MD  Cholecalciferol (CVS VITAMIN D3) 400 units CAPS Take 1 capsule (400 Units total) by mouth daily. 02/27/16   Olam Idler, MD  glucose blood (ONE TOUCH ULTRA TEST) test strip Check sugar 6 x daily 10/02/16   Sela Hua, MD  hydrochlorothiazide (HYDRODIURIL) 25 MG tablet Take 1 tablet (25 mg total) by mouth daily. 06/11/16   Sela Hua, MD  Insulin Syringe-Needle U-100 (INSULIN SYRINGE 1CC/31GX5/16") 31G X 5/16" 1 ML MISC 1 each by Does not apply route  See admin instructions. Use daily with insulin. 06/11/16   Sela Hua, MD  LANTUS 100 UNIT/ML injection INJECT 28 UNITS DAILY 10/05/16   Sela Hua, MD  lisinopril (PRINIVIL,ZESTRIL) 40 MG tablet Take 1 tablet (40 mg total) by mouth daily. 10/25/15   Zenia Resides, MD  ONE TOUCH LANCETS MISC 1 each by Does not apply route See admin instructions. Check blood sugar once daily. 02/21/15   Olam Idler, MD  spironolactone (ALDACTONE) 25 MG tablet Take 1 tablet (25 mg total) by mouth daily. 11/17/15   Olam Idler, MD    Family History Family History  Problem  Relation Age of Onset  . Adopted: Yes    Social History Social History  Substance Use Topics  . Smoking status: Former Smoker    Packs/day: 1.00    Types: Cigarettes    Start date: 04/26/2014    Quit date: 07/23/2015  . Smokeless tobacco: Never Used     Comment: Started in her 28's with multiple quit attempts for about 1 year at a time.  . Alcohol use No     Allergies   Amlodipine; Clonidine derivatives; and Metformin   Review of Systems Review of Systems  Constitutional: Positive for activity change, appetite change and fever. Negative for chills.  HENT: Negative.  Negative for congestion and trouble swallowing.   Respiratory: Positive for cough.        DOE  Cardiovascular: Positive for chest pain.  Gastrointestinal: Positive for nausea. Negative for abdominal pain, diarrhea and vomiting.  Genitourinary: Negative.  Negative for dysuria.  Musculoskeletal: Positive for back pain (Chronic, unchanged). Negative for myalgias.  Skin: Negative.  Negative for rash.  Neurological: Negative.  Negative for weakness.  Psychiatric/Behavioral: Negative for confusion.     Physical Exam Updated Vital Signs BP 158/57   Pulse 81   Temp 99.1 F (37.3 C) (Oral)   Resp 16   SpO2 98%   Physical Exam  Constitutional: She is oriented to person, place, and time. She appears well-developed and well-nourished. No distress.  HENT:  Head: Normocephalic.  Mouth/Throat: Oropharynx is clear and moist.  Neck: Normal range of motion. Neck supple.  Cardiovascular: Normal rate and regular rhythm.   Pulmonary/Chest: Effort normal. She has no wheezes. She has rales (RLL).  Abdominal: Soft. Bowel sounds are normal. There is no tenderness. There is no rebound and no guarding.  Musculoskeletal: Normal range of motion.  Neurological: She is alert and oriented to person, place, and time.  Skin: Skin is warm and dry. No rash noted. She is not diaphoretic.  Psychiatric: She has a normal mood and affect.       ED Treatments / Results  Labs (all labs ordered are listed, but only abnormal results are displayed) Labs Reviewed  BASIC METABOLIC PANEL - Abnormal; Notable for the following:       Result Value   CO2 21 (*)    Glucose, Bld 184 (*)    BUN 38 (*)    Creatinine, Ser 1.88 (*)    Calcium 8.8 (*)    GFR calc non Af Amer 26 (*)    GFR calc Af Amer 30 (*)    All other components within normal limits  CBC - Abnormal; Notable for the following:    WBC 19.9 (*)    RBC 3.18 (*)    Hemoglobin 9.6 (*)    HCT 28.6 (*)    All other components within normal limits  CULTURE, BLOOD (ROUTINE X  2)  CULTURE, BLOOD (ROUTINE X 2)  URINALYSIS, ROUTINE W REFLEX MICROSCOPIC (NOT AT [facility])  HEPATIC FUNCTION PANEL  I-STAT TROPOININ, ED  I-STAT CG4 LACTIC ACID, ED    EKG  EKG Interpretation None       Radiology Dg Chest 2 View  Result Date: 10/10/2016 CLINICAL DATA:  Productive cough and chest pain for 3-4 days. Dyspnea for 2 months and fever yesterday. EXAM: CHEST  2 VIEW COMPARISON:  None. FINDINGS: The heart size and mediastinal contours are within normal limits. Faint right pneumonic consolidation at the right lung base is noted on today's study. The visualized skeletal structures are unremarkable. IMPRESSION: Faint pneumonic consolidation in the right lung base medially. Electronically Signed   By: Ashley Royalty M.D.   On: 10/10/2016 20:39    Procedures Procedures (including critical care time)  Medications Ordered in ED Medications  cefTRIAXone (ROCEPHIN) 1 g in dextrose 5 % 50 mL IVPB (1 g Intravenous New Bag/Given 10/10/16 2215)  azithromycin (ZITHROMAX) 500 mg in dextrose 5 % 250 mL IVPB (not administered)  sodium chloride 0.9 % bolus 500 mL (500 mLs Intravenous New Bag/Given 10/10/16 2214)     Initial Impression / Assessment and Plan / ED Course  I have reviewed the triage vital signs and the nursing notes.  Pertinent labs & imaging results that were available during my  care of the patient were reviewed by me and considered in my medical decision making (see chart for details).  Clinical Course    The patient has had cough and fever x 3 days and has a RLL PNA on CXR. She arrived febrile to 102, but stable vitals otherwise. No history of or current confusion. No evidence of sepsis.  She has an elevated BUN and Cr indicating AKI. Gentle hydration ordered. Discussed with [place] who accepts for admission for treatment of non-septic CAP.   Final Clinical Impressions(s) / ED Diagnoses   Final diagnoses:  Community acquired pneumonia of right lower lobe of lung [facility])    New Prescriptions New Prescriptions   No medications on file     Charlann Lange, PA-C 10/10/16 2243    Quintella Reichert, MD 10/12/16 [phone]

## 2016-10-10 NOTE — ED Provider Notes (Signed)
CSN: [id number]     Arrival date & time 10/10/16  1730 History   First MD Initiated Contact with Patient 10/10/16 1814     Chief Complaint  Patient presents with  . Cough   (Consider location/radiation/quality/duration/timing/severity/associated sxs/prior Treatment) Pt is brought in by granddaughter. States that she has had a productive cough for the past 4 days with some bloody seen in the phlem. Running high fevers, eating very minimal, states that she is unsure what her sugars have been. Lethargic when talking to staff. Has had a hx of pneumonia in the past. C/O some sob. Wheezing upon auscultation. Poor historian. Elevated BP not taking meds as directed since being sick. Does not live with the granddaughter so she is unable to tell us what she has taken. Discussed with MD and felt pt should go to the ER.       Past Medical History:  Diagnosis Date  . Back pain    herniated disc lower back  . Diabetes mellitus without complication ([place])   . Hypertension    Past Surgical History:  Procedure Laterality Date  . ABDOMINAL HYSTERECTOMY     Family History  Problem Relation Age of Onset  . Adopted: Yes   Social History  Substance Use Topics  . Smoking status: Former Smoker    Packs/day: 1.00    Types: Cigarettes    Start date: 04/26/2014    Quit date: 07/23/2015  . Smokeless tobacco: Never Used     Comment: Started in her 29's with multiple quit attempts for about 1 year at a time.  . Alcohol use No   OB History    No data available     Review of Systems  Respiratory: Positive for cough, shortness of breath and wheezing.   Genitourinary: Positive for frequency.  Neurological: Positive for weakness and light-headedness.    Allergies  Amlodipine; Clonidine derivatives; and Metformin  Home Medications   Prior to Admission medications   Medication Sig Start Date End Date Taking? Authorizing Provider  aspirin (ASPIRIN CHILDRENS) 81 MG chewable tablet Chew 81 mg by mouth  daily.      Historical Provider, MD  atorvastatin (LIPITOR) 40 MG tablet Take 1 tablet (40 mg total) by mouth daily at 6 PM. 03/13/16   Olam Idler, MD  calcium-vitamin D (OSCAL 500/200 D-3) 500-200 MG-UNIT tablet Take 2 tablets by mouth daily with breakfast. 02/27/16   Olam Idler, MD  carvedilol (COREG) 25 MG tablet take 1 tablet by mouth twice a day with meals 10/01/16   Sela Hua, MD  Cholecalciferol ([employer]S VITAMIN D3) 400 units CAPS Take 1 capsule (400 Units total) by mouth daily. 02/27/16   Olam Idler, MD  glucose blood (ONE TOUCH ULTRA TEST) test strip Check sugar 6 x daily 10/02/16   Sela Hua, MD  hydrochlorothiazide (HYDRODIURIL) 25 MG tablet Take 1 tablet (25 mg total) by mouth daily. 06/11/16   Sela Hua, MD  Insulin Syringe-Needle U-100 (INSULIN SYRINGE 1CC/31GX5/16") 31G X 5/16" 1 ML MISC 1 each by Does not apply route See admin instructions. Use daily with insulin. 06/11/16   Sela Hua, MD  LANTUS 100 UNIT/ML injection INJECT 28 UNITS DAILY 10/05/16   Sela Hua, MD  lisinopril (PRINIVIL,ZESTRIL) 40 MG tablet Take 1 tablet (40 mg total) by mouth daily. 10/25/15   Zenia Resides, MD  ONE TOUCH LANCETS MISC 1 each by Does not apply route See admin instructions. Check blood sugar once daily.  02/21/15   Olam Idler, MD  spironolactone (ALDACTONE) 25 MG tablet Take 1 tablet (25 mg total) by mouth daily. 11/17/15   Olam Idler, MD   Meds Ordered and Administered this Visit   Medications  acetaminophen (TYLENOL) tablet 650 mg (650 mg Oral Given 10/10/16 1758)    BP 186/63 (BP Location: Right Arm)   Pulse 89   Temp 102.9 F (39.4 C) (Oral)   Resp 18   SpO2 97%  No data found.   Physical Exam  Cardiovascular: Normal rate.   Pulmonary/Chest: She has wheezes. She has rales.  Abdominal: Soft.  Neurological:  Only opening eyes to voice, when talking to pt she appears lethargic, alert to person, place,   Skin: Capillary refill takes 2 to 3 seconds.   Skin hot on palpation     Urgent Care Course   Clinical Course     Procedures (including critical care time)  Labs Review Labs Reviewed - No data to display  Imaging Review No results found.           MDM   1. Cough    Go to the [place], NP 10/10/16 [phone]

## 2016-10-10 NOTE — ED Triage Notes (Signed)
The patient presented to the [facility] with a complaint of a cough. The patient reported that she has had a cough that has been productive x 4 days. The patient reported that she has been running a fever at home.

## 2016-10-11 LAB — BASIC METABOLIC PANEL
Anion gap: 7 (ref 5–15)
BUN: 40 mg/dL — ABNORMAL HIGH (ref 6–20)
CO2: 21 mmol/L — ABNORMAL LOW (ref 22–32)
Calcium: 8.1 mg/dL — ABNORMAL LOW (ref 8.9–10.3)
Chloride: 110 mmol/L (ref 101–111)
Creatinine, Ser: 1.69 mg/dL — ABNORMAL HIGH (ref 0.44–1.00)
GFR calc Af Amer: 34 mL/min — ABNORMAL LOW (ref >60)
GFR calc non Af Amer: 29 mL/min — ABNORMAL LOW (ref >60)
Glucose, Bld: 151 mg/dL — ABNORMAL HIGH (ref 65–99)
Potassium: 3.9 mmol/L (ref 3.5–5.1)
Sodium: 138 mmol/L (ref 135–145)

## 2016-10-11 LAB — CBC
HCT: 25.4 % — ABNORMAL LOW (ref 36.0–46.0)
Hemoglobin: 8.5 g/dL — ABNORMAL LOW (ref 12.0–15.0)
MCH: 30 pg (ref 26.0–34.0)
MCHC: 33.5 g/dL (ref 30.0–36.0)
MCV: 89.8 fL (ref 78.0–100.0)
Platelets: 159 10*3/uL (ref 150–400)
RBC: 2.83 MIL/uL — ABNORMAL LOW (ref 3.87–5.11)
RDW: 12 % (ref 11.5–15.5)
WBC: 19.7 10*3/uL — ABNORMAL HIGH (ref 4.0–10.5)

## 2016-10-11 LAB — EXPECTORATED SPUTUM ASSESSMENT W GRAM STAIN, RFLX TO RESP C

## 2016-10-11 LAB — GLUCOSE, CAPILLARY
Glucose-Capillary: 119 mg/dL — ABNORMAL HIGH (ref 65–99)
Glucose-Capillary: 149 mg/dL — ABNORMAL HIGH (ref 65–99)

## 2016-10-11 MED ORDER — AMOXICILLIN-POT CLAVULANATE 875-125 MG PO TABS: 1.0000 | ORAL_TABLET | Freq: Two times a day (BID) | ORAL | Status: DC

## 2016-10-11 MED ORDER — AMOXICILLIN-POT CLAVULANATE 875-125 MG PO TABS: 1.0000 | ORAL_TABLET | Freq: Two times a day (BID) | ORAL | 0 refills | Status: DC

## 2016-10-11 MED ORDER — DEXTROSE 5 % IV SOLN
1.0000 g | INTRAVENOUS | Status: DC
  Filled 2016-10-11: qty 10

## 2016-10-11 MED ORDER — AMOXICILLIN-POT CLAVULANATE 875-125 MG PO TABS
1.0000 | ORAL_TABLET | Freq: Two times a day (BID) | ORAL | Status: DC
  Administered 2016-10-11: 1 via ORAL
  Filled 2016-10-11: qty 1

## 2016-10-11 MED ORDER — AMOXICILLIN-POT CLAVULANATE 875-125 MG PO TABS: 2.0000 | ORAL_TABLET | Freq: Two times a day (BID) | ORAL | Status: DC

## 2016-10-11 MED ORDER — AZITHROMYCIN 250 MG PO TABS
250.0000 mg | ORAL_TABLET | Freq: Every day | ORAL | Status: DC
  Filled 2016-10-11: qty 1

## 2016-10-11 MED ORDER — CEFTRIAXONE SODIUM 1 G IJ SOLR
1.0000 g | INTRAMUSCULAR | Status: DC
  Filled 2016-10-11: qty 10

## 2016-10-11 MED ORDER — AZITHROMYCIN 250 MG PO TABS: 250.0000 mg | ORAL_TABLET | Freq: Every day | ORAL | 0 refills | Status: DC

## 2016-10-11 NOTE — Care Management CC44 (Signed)
Condition Code 44 Documentation Completed  Patient Details  Name: Kimberly Carlson MRN: [id number] Date of Birth: 1946-03-08   Condition Code 44 given:  Yes Patient signature on Condition Code 44 notice:  Yes Documentation of 2 MD's agreement:  Yes Code 44 added to claim:  Yes    Pollie Friar, RN 10/11/2016, 3:29 PM

## 2016-10-11 NOTE — Discharge Summary (Signed)
[facility] Discharge Summary  Patient name: Kimberly Carlson Medical record number: [id number] Date of birth: 23-Nov-1946 Age: 70 y.o. Gender: female Date of Admission: 10/10/2016  Date of Discharge: 10/11/2016 Admitting Physician: Dickie La, MD  Primary Care Provider: Evette Doffing, MD Consultants: None  Indication for Hospitalization: Community acquired pneumonia  Discharge Diagnoses/Problem List:  Community acquired pneumonia Acute kidney injury  Disposition: Home  Discharge Condition: Stable, improved  Discharge Exam: General: well nourished, well developed, in no acute distress with non-toxic appearance HEENT: normocephalic, atraumatic, moist mucous membranes CV: regular rate and rhythm without murmurs, rubs, or gallops Lungs: transmitted upper airway sounds noted throughout all fields with normal work of breathing Abdomen: soft, non-tender, normoactive bowel sounds Skin: warm, dry, no rashes or lesions, cap refill < 2 seconds Extremities: warm and well perfused, normal tone, 1+ edema to mid shins bilaterally.   Brief Hospital Course:  Kimberly Carlson a 70 y.o.femalepresenting with fevers, productive cough, and dyspnea. PMH is significant for HTN, HLD, DMT2, OA.  Patient presented with dyspnea, productive cough, and fevers for 4 days duration. Upon admission, patient was febrile at 100.48F and CXR was consistent with right-sided pneumonic consolidation. Leukocytosis was noted at 19.9. Patient was subsequently treated for CAP with rocephin and azithromycin. Patient was also noted to have AKI, and was given IVF. CAP symptoms showed improvement and AKI imporved over the course of 24 hours. Antibiotics were transitioned to Bactrim and Azithro was continued with PCP f/u.  Issues for Follow Up:  1. History of OA, consider bone density scan as outpatient. 2. Small Hgb in UA, repeat UA after discharge.  3. Noted to be anemic at 8.5 with  normocytic anemia.  Please recheck CBC, consider anemia workup as outpatient, follow up age-appropriate cancer screening. 4. Pt had an AKI, likely from not taking good PO prior to admission. We held her Spironolactone, HCTZ, and Lisinopril while she was hospitalized. Her Cr improved, but not back to baseline. On discharge, we recommended she resume her Spironolactone and HCTZ, but we held her Lisinopril. Please follow-up blood pressure and restart Lisinopril as appropriate. 5. Pt was discharged on a total 5 day course of Azithromycin and a total 7 day course of Augmentin. Please make sure Pt took these medications.  Significant Procedures: None  Significant Labs and Imaging:   Recent Labs Lab 10/10/16 2013 10/11/16 0438  WBC 19.9* 19.7*  HGB 9.6* 8.5*  HCT 28.6* 25.4*  PLT 178 159    Recent Labs Lab 10/10/16 2013 10/10/16 2151 10/11/16 0438  NA 135  --  138  K 3.9  --  3.9  CL 105  --  110  CO2 21*  --  21*  GLUCOSE 184*  --  151*  BUN 38*  --  40*  CREATININE 1.88*  --  1.69*  CALCIUM 8.8*  --  8.1*  ALKPHOS  --  83  --   AST  --  22  --   ALT  --  24  --   ALBUMIN  --  3.0*  --    Imaging/Diagnostic Tests: DG Chest 2 View (10/10/16) FINDINGS: The heart size and mediastinal contours are within normal limits. Faint right pneumonic consolidation at the right lung base is noted on today's study. The visualized skeletal structures are unremarkable.  Results/Tests Pending at Time of Discharge: Urinary strep pneumo, urinary legionella, sputum culture  Discharge Medications:    Medication List    STOP taking these medications  lisinopril 40 MG tablet Commonly known as:  PRINIVIL,ZESTRIL     TAKE these medications   amoxicillin-clavulanate 875-125 MG tablet Commonly known as:  AUGMENTIN Take 1 tablet by mouth every 12 (twelve) hours.   ASPIRIN CHILDRENS 81 MG chewable tablet Generic drug:  aspirin Chew 81 mg by mouth daily.   atorvastatin 40 MG  tablet Commonly known as:  LIPITOR Take 1 tablet (40 mg total) by mouth daily at 6 PM.   azithromycin 250 MG tablet Commonly known as:  ZITHROMAX Take 1 tablet (250 mg total) by mouth daily.   calcium-vitamin D 500-200 MG-UNIT tablet Commonly known as:  OSCAL 500/200 D-3 Take 2 tablets by mouth daily with breakfast.   carvedilol 25 MG tablet Commonly known as:  COREG take 1 tablet by mouth twice a day with meals   Cholecalciferol 400 units Caps Commonly known as:  [employer] VITAMIN D3 Take 1 capsule (400 Units total) by mouth daily.   glucose blood test strip Commonly known as:  ONE TOUCH ULTRA TEST Check sugar 6 x daily   hydrochlorothiazide 25 MG tablet Commonly known as:  HYDRODIURIL Take 1 tablet (25 mg total) by mouth daily.   INSULIN SYRINGE 1CC/31GX5/16" 31G X 5/16" 1 ML Misc 1 each by Does not apply route See admin instructions. Use daily with insulin.   LANTUS 100 UNIT/ML injection Generic drug:  insulin glargine INJECT 28 UNITS DAILY   ONE TOUCH LANCETS Misc 1 each by Does not apply route See admin instructions. Check blood sugar once daily.   spironolactone 100 MG tablet Commonly known as:  ALDACTONE Take 100 mg by mouth daily.       Discharge Instructions: Please refer to Patient Instructions section of EMR for full details.  Patient was counseled important signs and symptoms that should prompt return to medical care, changes in medications, dietary instructions, activity restrictions, and follow up appointments.   Follow-Up Appointments: Patient to call PCP office and schedule hospital f/u.  Hatton Bing, DO 10/11/2016, 9:03 PM PGY-1, [place]

## 2016-10-11 NOTE — Progress Notes (Signed)
Discharge instructions reviewed with patient-emphasis on adhering to and completing antibiotic regimen

## 2016-10-11 NOTE — Progress Notes (Signed)
Patient arrived to 72m22 via bed with NT. VS taken, orient pt to unit.

## 2016-10-11 NOTE — ED Notes (Signed)
Pt made aware of bed assignment 

## 2016-10-11 NOTE — ED Notes (Signed)
Pt alert and oriented x4 being admitted for pneumonia. Pt ambulatory to the bathroom with no problems. Pt placed in hospital bed for comfort. VS stable pt last temp 99.0.

## 2016-10-11 NOTE — Progress Notes (Signed)
Family Medicine Teaching Service Daily Progress Note Intern Pager: [phone]  Patient name: Kimberly Carlson Medical record number: [id number] Date of birth: 22-Dec-1945 Age: 70 y.o. Gender: female  Primary Care Provider: Evette Doffing, MD Consultants: None Code Status: Full  Pt Overview and Major Events to Date:  11/15: Admit for CAP based on clinical presentation and CXR w/ R pneumonic consolidation, Rocephin and azithro given  Assessment and Plan: Kimberly Carlson is a 70 y.o. female presenting with fevers, productive cough, and dyspnea . PMH is significant for HTN, HLD, DMT2, OA.  #Community Acquired Pneumonia, Acute, Improving: Patient presenting with productive cough. Denies CP on presentation. Troponin neg. CXR showed pneumonic consolidation in the right lung base medially. Lactic acid neg. BCx, and UA obtained. Febrile to 102.9 on admission.  Leukocytosis 19.9, BUN 38, normal respiratory effort with BP at 150/50-70s on admission. No h/o asthma or lung disease. Short smoking hx but stopped 1 year ago. Clinical picture consistent with CAP given fevers, productive cough, and dyspnea. Meets sepsis criteria. qSOFA score 0. Given age, meets 3 points CURB-65 for severe criteria. Will provide abx, fluids, and monitor for improvement of respiratory status. Patient on RA without need for O2. PE unlikely given hx. H/o hemoptysis, may be bronchitis. Leukocytosis improving.  --Given Rocephin IV 1 g in ED, will continue for now, consider transition to oral beta lactam if improving clinically --Will initiate Azithromycin 500 mg x1 day followed by x4 days 250 mg for CAP (day 2) --BCx and UA pending --Sputum Cx pending --Strep urine AG pending --Legionella urine Ag pending  #Acute Kidney Injury, Improving: Creatinine 1.8>1.69. Baseline 1.1-1.2. Patient has decreased PO intake with nausea in the context of CAP. --IVF NS @125cc /hr for x12 hrs --F/u BMET in AM  #Hypertension, Chronic,  Stable: BP 120-130/50. Takes BB, thiazide, ACE-I, and K-sparing for BP control. Last ECHO on 11/2015 showed LVEF 65-70%, normal wall motion, G1DD. Renal U/S neg for renal artery stenosis on 11/2015. EKG NSR on admission. Former 5 pack year smoker, quit 1 year ago. --Coreg 25 mg BID --Holding home HCTZ, Lisinopril, and Spironolactone given AKI  #Hyperlipidemia, Chronic, Stable: Lipid panel 05/2015: tot cholesterol elevated 216, TGY 51, HDL 86, elevated LDL 120. --Atorvastatin 40 mg QD --ASA 81 QD  #Osteoporosis / Osteoarthritis, Chronic, Stable: Last Vit D, 25-Hydroxy was 13 on 02/2016. --Cholecalciferol 400 U QD --Calcium-vitamin D 500-200 mg-U BID --No bone density scan in chart, consider with PCP f/u  #Diabetes Mellitus Type 2, Chronic, Stable: Last A1c 6.4 02/2016. CBG 184 on admission.  Does endorse some decrease in appetite over last 24 hours. On lantus 28 U daily at home.  --Lantus 14 U QD --Moderate SSI --Monitor CBGs  FEN/GI: Carb modified diet Prophylaxis: Lovenox SQ renally dosed  Disposition: Will transition to PO abx, anticipate d/c home  Subjective:  No overnight events since admission. Remains afebrile. Patient finished breakfast. Says she feels well without dyspnea, chest pain, or nausea. Continues to have cough with minimal sputum production.   Objective: Temp:  [98.1 F (36.7 C)-102.9 F (39.4 C)] 98.3 F (36.8 C) (11/16 0456) Pulse Rate:  [78-92] 81 (11/16 0456) Resp:  [16-28] 20 (11/16 0456) BP: (126-186)/(54-73) 126/57 (11/16 0456) SpO2:  [95 %-99 %] 97 % (11/16 0456) Weight:  [204 lb (92.5 kg)] 204 lb (92.5 kg) (11/16 0225) Physical Exam: General: well nourished, well developed, in no acute distress with non-toxic appearance HEENT: normocephalic, atraumatic, moist mucous membranes CV: regular rate and rhythm without murmurs, rubs,  or gallops Lungs: transmitted upper airway sounds noted throughout all fields with normal work of breathing Abdomen:  soft, non-tender, normoactive bowel sounds Skin: warm, dry, no rashes or lesions, cap refill < 2 seconds Extremities: warm and well perfused, normal tone, 1+ edema to mid shins bilaterally.   Laboratory:  Recent Labs Lab 10/10/16 2013 10/11/16 0438  WBC 19.9* 19.7*  HGB 9.6* 8.5*  HCT 28.6* 25.4*  PLT 178 159    Recent Labs Lab 10/10/16 2013 10/10/16 2151 10/11/16 0438  NA 135  --  138  K 3.9  --  3.9  CL 105  --  110  CO2 21*  --  21*  BUN 38*  --  40*  CREATININE 1.88*  --  1.69*  CALCIUM 8.8*  --  8.1*  PROT  --  6.4*  --   BILITOT  --  0.6  --   ALKPHOS  --  83  --   ALT  --  24  --   AST  --  22  --   GLUCOSE 184*  --  151*   Imaging/Diagnostic Tests: DG Chest 2 View (10/10/16) FINDINGS: The heart size and mediastinal contours are within normal limits. Faint right pneumonic consolidation at the right lung base is noted on today's study. The visualized skeletal structures are unremarkable.  IMPRESSION: Faint pneumonic consolidation in the right lung base medially.  Kimberly Bing, DO 10/11/2016, 9:02 AM PGY-1, [place] Intern pager: [phone], text pages welcome

## 2016-10-11 NOTE — Discharge Instructions (Signed)
It was so nice to meet you!  You were hospitalized for pneumonia. We gave you IV antibiotics. You will need to continue these antibiotics when you go home. Please take Azithromycin once a day for four more days (including this evening). Please take Augmentin twice a day for 6 more days (including this evening).   Your blood work showed that your kidneys were a little bit injured when you came in. This is probably because you weren't drinking well. Please stop taking your Lisinopril for now. We may restart this medication at your hospital follow-up visit.

## 2016-10-11 NOTE — Care Management Obs Status (Signed)
[place] NOTIFICATION   Patient Details  Name: PRESLYNN BIER MRN: [id number] Date of Birth: 1946/05/31   Medicare Observation Status Notification Given:  Yes    Pollie Friar, RN 10/11/2016, 3:29 PM

## 2016-10-11 NOTE — Care Management Note (Signed)
Case Management Note  Patient Details  Name: Kimberly Carlson MRN: [id number] Date of Birth: [birth year]  Subjective/Objective:  Pt in with pneumonia. She is from home.               Action/Plan: Pt discharging home with her daughter. Daughter going to provide transportation home. No further needs per CM.   Expected Discharge Date:                  Expected Discharge Plan:  Home/Self Care  In-House Referral:     Discharge planning Services     Post Acute Care Choice:    Choice offered to:     DME Arranged:    DME Agency:     HH Arranged:    [place] Agency:     Status of Service:  Completed, signed off  If discussed at [employer] of Stay Meetings, dates discussed:    Additional Comments:  Pollie Friar, RN 10/11/2016, 3:36 PM

## 2016-10-11 NOTE — ED Notes (Signed)
Pt transferred to hospital bed

## 2016-10-12 LAB — LEGIONELLA PNEUMOPHILA TOTAL AB: Legionella Pneumo Total Ab: <0.91 OD ratio (ref 0.00–0.90)

## 2016-10-13 LAB — CULTURE, RESPIRATORY W GRAM STAIN: Culture: NORMAL

## 2016-10-15 ENCOUNTER — Encounter: Payer: Self-pay | Admitting: Internal Medicine

## 2016-10-15 LAB — CULTURE, BLOOD (ROUTINE X 2)
Culture: NO GROWTH
Culture: NO GROWTH

## 2016-10-16 NOTE — Progress Notes (Signed)
   [facility] Phone: [phone]   Date of Visit: 10/17/2016   HPI:  Patient is here for hospital follow up. She was hospitalized from 11/15 to 11/16 for CAP and also found to have AKI. Patient was treated with Rocephin and Azithromycin then transitioned to Bactrim and Azithromycin. She was discharged with 7 day course of Augmentin and 5 day course of Azithromycin. AKI was thought to be pre-renal which did improve. Her Spironolactone, HCTZ, and Lisinopril were initially held and resumed Spironolactone and HCTZ on discharge. Lisinopril held on discharge. She was also found to have anemia with normocytic anemia with hgb 8.5.  - started Augmentin on the 16th and has two pills left. Completed Azithromycin yesterday  - watery diarrhea since Thursday. 10-12 times a day. No significant odor. Does have a little consistency. No abdominal pain. No fever. No blood in stool; no dark tarry stools. Symptoms are improving. No sick contacts with diarrhea. No nausea/emesis - No shortness of breath. Cough is improving - has not been checking BP at home recently. No chest pain, HA, or visual disturbances.  - on admission, UA noted to have small hgb with RBC 6-30, large LE, negative nitrite. Patient denies dysuria, abdominal pain, or increased frequency of urination   ROS: See HPI.  Privateer:  HTN with Hx HTNsive urgency DM2 Hypercholesterolemia Obesity Cocaine Use  PHYSICAL EXAM: BP (!) 158/80   Pulse 72   Temp 98.3 F (36.8 C) (Oral)   Ht 5\' 3"  (1.6 m)   Wt 200 lb (90.7 kg)   LMP  (LMP Unknown)   SpO2 96%   BMI 35.43 kg/m  GEN: NAD HEENT: moist mucous membranes CV: RRR, no murmurs, rubs, or gallops, normal capillary refill PULM: CTAB, normal effort ABD: Soft, nontender, nondistended, NABS, no organomegaly SKIN: No rash or cyanosis; warm and well-perfused EXTR: No lower extremity edema or calf tenderness PSYCH: Mood and affect euthymic, normal rate and volume of  speech NEURO: Awake, alert, no focal deficits grossly, normal speech  ASSESSMENT/PLAN:  HYPERTENSION, BENIGN SYSTEMIC Elevated with slight improvement on repeat check. Asymptomatic. Lisinopril was held upon discharge due to AKI. Will check BMP prior to re-starting Lisinopril. Will call patient as soon as BMP results    Hospital Follow Up for CAP: symptoms improving.  - complete last dose of Augmentin   Diarrhea:  Likely due to antibiotic use. Reassuring that symptoms are improving. Only has one more day of antibiotic. Not consistent with C.Diff.  - encouraged importance of hydration - recommended OTC probiotic and yogurt - return to clinic/ED if symptoms do not improve in the next few days or if unable to stay hydrated.   Normocytic Anemia:  - will obtain iron studies  - referral to GI made for screening colonoscopy  AKI: - repeat BMP to evaluate Cr  Abnormal UA: repeat UA with moderate hgb on dipstick with 0-3 RBC on microscopy. Patient has had RBC in urine prior to this. Per chart history, does have history of tobacco use.  - will refer to urology - discussed with preceptor   Smiley Houseman, MD PGY [place]

## 2016-10-17 ENCOUNTER — Ambulatory Visit (INDEPENDENT_AMBULATORY_CARE_PROVIDER_SITE_OTHER): Payer: Medicare Other | Admitting: Internal Medicine

## 2016-10-17 ENCOUNTER — Encounter: Payer: Self-pay | Admitting: Internal Medicine

## 2016-10-17 VITALS — BP 158/80 | HR 72 | Temp 98.3°F | Ht 63.0 in | Wt 200.0 lb

## 2016-10-17 DIAGNOSIS — Z09 Encounter for follow-up examination after completed treatment for conditions other than malignant neoplasm: Secondary | ICD-10-CM | POA: Diagnosis present

## 2016-10-17 DIAGNOSIS — D649 Anemia, unspecified: Secondary | ICD-10-CM | POA: Diagnosis not present

## 2016-10-17 DIAGNOSIS — Z1212 Encounter for screening for malignant neoplasm of rectum: Secondary | ICD-10-CM

## 2016-10-17 DIAGNOSIS — R8290 Unspecified abnormal findings in urine: Secondary | ICD-10-CM | POA: Diagnosis not present

## 2016-10-17 DIAGNOSIS — I10 Essential (primary) hypertension: Secondary | ICD-10-CM | POA: Diagnosis not present

## 2016-10-17 DIAGNOSIS — Z1211 Encounter for screening for malignant neoplasm of colon: Secondary | ICD-10-CM | POA: Diagnosis not present

## 2016-10-17 DIAGNOSIS — R197 Diarrhea, unspecified: Secondary | ICD-10-CM

## 2016-10-17 DIAGNOSIS — N179 Acute kidney failure, unspecified: Secondary | ICD-10-CM

## 2016-10-17 LAB — POCT URINALYSIS DIPSTICK
Bilirubin, UA: NEGATIVE
Glucose, UA: NEGATIVE
Ketones, UA: NEGATIVE
Leukocytes, UA: NEGATIVE
Nitrite, UA: NEGATIVE
Protein, UA: >=300
Spec Grav, UA: 1.025
Urobilinogen, UA: 0.2
pH, UA: 5.5

## 2016-10-17 LAB — POCT UA - MICROSCOPIC ONLY

## 2016-10-17 NOTE — Assessment & Plan Note (Signed)
Elevated with slight improvement on repeat check. Asymptomatic. Lisinopril was held upon discharge due to AKI. Will check BMP prior to re-starting Lisinopril. Will call patient as soon as BMP results

## 2016-10-17 NOTE — Patient Instructions (Addendum)
Try a probiotic which can be found as an over the counter medication We will get labs today. I will call about the results or send via mail  Please make a follow up appointment with your PCP in about 3-4 weeks  I made a referral to the GI doctor for your colonoscopy.  Please follow up sooner if your diarrhea does not improve in the next few days or if you feel like you cannot stay hydrated at home

## 2016-10-17 NOTE — Telephone Encounter (Signed)
I received an email from patient via mychart. I have been trying to call her, but her voicemail box is not set up, so I have not been able to get in touch with her over the phone. I just went ahead and emailed her today. I sent the following message:  Hey Ms. Zammit, I have been trying to call you, but it says your voicemail box is not set up, so I am unable to leave a message. I'm sorry you are still having diarrhea. If you feel like your breathing has gotten back to normal, I would suggest just stopping the antibiotic. I think the diarrhea is most likely being caused by the antibiotics you've been taking. The most important thing is that you keep yourself very well hydrated. I would also recommend taking a probiotic to replace some of the good bacteria in your gut that the antibiotic killed off. If this is really bothersome to you, or if you feel like your symptoms are not getting better, I would recommend that you call into clinic to be seen for a same day appointment. I hope this starts to get better! -Dr. Brett Albino

## 2016-10-18 LAB — BASIC METABOLIC PANEL WITH GFR
BUN: 35 mg/dL — ABNORMAL HIGH (ref 7–25)
CO2: 21 mmol/L (ref 20–31)
Calcium: 9.1 mg/dL (ref 8.6–10.4)
Chloride: 109 mmol/L (ref 98–110)
Creat: 1.72 mg/dL — ABNORMAL HIGH (ref 0.60–0.93)
GFR, Est African American: 34 mL/min — ABNORMAL LOW (ref >=60)
GFR, Est Non African American: 29 mL/min — ABNORMAL LOW (ref >=60)
Glucose, Bld: 139 mg/dL — ABNORMAL HIGH (ref 65–99)
Potassium: 4.9 mmol/L (ref 3.5–5.3)
Sodium: 140 mmol/L (ref 135–146)

## 2016-10-18 LAB — IRON AND TIBC
%SAT: 29 % (ref 11–50)
Iron: 73 ug/dL (ref 45–160)
TIBC: 252 ug/dL (ref 250–450)
UIBC: 179 ug/dL (ref 125–400)

## 2016-10-18 LAB — FERRITIN: Ferritin: 536 ng/mL — ABNORMAL HIGH (ref 20–288)

## 2016-10-24 ENCOUNTER — Telehealth: Payer: Self-pay | Admitting: Internal Medicine

## 2016-10-24 DIAGNOSIS — R319 Hematuria, unspecified: Secondary | ICD-10-CM

## 2016-10-24 MED ORDER — HYDRALAZINE HCL 10 MG PO TABS: 10.0000 mg | ORAL_TABLET | Freq: Three times a day (TID) | ORAL | 0 refills | Status: DC

## 2016-10-24 NOTE — Telephone Encounter (Addendum)
Called patient to discuss lab work. Her creatinine has been stable since her hospital stay. I think this may not be an AKI but just worsening of her CKD. Her K is 4.9. Due to this and since she is also on Spironolactone already, I am hesitant to restart Lisinopril. We decided to start Hydralazine instead for her HTN. Start at Hydralazine 10mg  TID and follow up with PCP in 1 week for HTN. She is seen by [facility] but has not been seen since 03/2016; at that time, her creatinine was slowly increasing up to 1.44. I asked her to make a follow up appointment with them soon. Her UA showed persistent RBC, therefore will refer her to urology for further evaluation. Her iron panel was consistent with anemia of chronic disease which is possibly due to her CKD. However she is due for her colonoscopy (referall made at last visit). Answered all patient questions.

## 2016-10-25 ENCOUNTER — Other Ambulatory Visit: Payer: Self-pay | Admitting: Family Medicine

## 2016-10-30 ENCOUNTER — Encounter: Payer: Self-pay | Admitting: Internal Medicine

## 2016-11-05 ENCOUNTER — Other Ambulatory Visit: Payer: Self-pay | Admitting: Internal Medicine

## 2016-11-05 DIAGNOSIS — I10 Essential (primary) hypertension: Secondary | ICD-10-CM

## 2016-11-20 ENCOUNTER — Ambulatory Visit (INDEPENDENT_AMBULATORY_CARE_PROVIDER_SITE_OTHER): Payer: Medicare Other

## 2016-11-20 ENCOUNTER — Ambulatory Visit (HOSPITAL_COMMUNITY)
Admission: EM | Admit: 2016-11-20 | Discharge: 2016-11-20 | Disposition: A | Discharge status: Home / Self Care | Payer: Medicare Other | Attending: Emergency Medicine | Admitting: Emergency Medicine

## 2016-11-20 ENCOUNTER — Encounter (HOSPITAL_COMMUNITY): Payer: Self-pay | Admitting: Emergency Medicine

## 2016-11-20 DIAGNOSIS — J40 Bronchitis, not specified as acute or chronic: Secondary | ICD-10-CM

## 2016-11-20 DIAGNOSIS — R05 Cough: Secondary | ICD-10-CM | POA: Diagnosis not present

## 2016-11-20 MED ORDER — PREDNISONE 50 MG PO TABS: ORAL_TABLET | ORAL | 0 refills | Status: DC

## 2016-11-20 MED ORDER — BENZONATATE 100 MG PO CAPS: 100.0000 mg | ORAL_CAPSULE | Freq: Three times a day (TID) | ORAL | 0 refills | Status: DC

## 2016-11-20 MED ORDER — DOXYCYCLINE HYCLATE 100 MG PO CAPS: 100.0000 mg | ORAL_CAPSULE | Freq: Two times a day (BID) | ORAL | 0 refills | Status: DC

## 2016-11-20 NOTE — Discharge Instructions (Signed)
You have bronchitis. Take doxycycline and prednisone as prescribed. Use tessalone as needed for cough. You should see improvement in the next 3-5 days. If you develop fevers, difficulty breathing, or are just not getting better, please come back or go to the emergency room.

## 2016-11-20 NOTE — ED Triage Notes (Signed)
Symptoms started over the weekend.  Patient has a cough, reports she is coughing up blood.  Patient reports she had pneumonia in November, 2017.  Reports pcp told her pneumonia was resolved when checked after treatment.  Patient concerned she has pneumonia again.  Reports fever 102.7 yesterday

## 2016-11-20 NOTE — ED Provider Notes (Signed)
[place]    CSN: [id number] Arrival date & time: 11/20/16  1348     History   Chief Complaint Chief Complaint  Patient presents with  . URI    HPI Kimberly Carlson is a 70 y.o. female.   HPI  She is a 70 year old woman here for evaluation of cough and fever. Symptoms started on Friday with cough and fever. She also reports chills. Cough is productive. She has been coughing up big hunks of blood. Denies any significant shortness of breath. No nasal congestion or rhinorrhea. No sore throat. No nausea or vomiting. About 6 weeks ago she was diagnosed and treated for a right lower lobe pneumonia. She states she feels very much the same and is concerned she has pneumonia again.  She is a former smoker. She quit about 15 months ago.  Past Medical History:  Diagnosis Date  . Back pain    herniated disc lower back  . Diabetes mellitus without complication ([place])   . Hypertension     Patient Active Problem List   Diagnosis Date Noted  . Community acquired pneumonia 10/10/2016  . Pneumonia 10/10/2016  . Hypovitaminosis D 02/28/2016  . Low back pain 02/27/2016  . Cough 02/27/2016  . Cataract 02/27/2016  . Neck pain on left side 12/08/2015  . Edema of both legs 08/11/2015  . Anemia 08/10/2015  . Hematuria, undiagnosed cause 06/09/2015  . Headache 06/08/2015  . Hypertensive urgency 06/08/2015  . Cocaine use 06/08/2015  . Noncompliance with medications   . Knee pain, bilateral 03/24/2014  . UTI (urinary tract infection) 05/27/2013  . Resting tremor 05/07/2012  . DIPLOPIA 11/28/2010  . DEGENERATIVE DISC DISEASE 11/09/2009  . History of tobacco use 09/03/2009  . Obesity 07/08/2009  . HERNIATED LUMBAR DISC 05/14/2007  . DM2 (diabetes mellitus, type 2) ([place]) 01/23/2007  . HYPERCHOLESTEROLEMIA 01/23/2007  . HYPERTENSION, BENIGN SYSTEMIC 01/23/2007    Past Surgical History:  Procedure Laterality Date  . ABDOMINAL HYSTERECTOMY      OB History    No data  available       Home Medications    Prior to Admission medications   Medication Sig Start Date End Date Taking? Authorizing Provider  aspirin (ASPIRIN CHILDRENS) 81 MG chewable tablet Chew 81 mg by mouth daily.      Historical Provider, MD  atorvastatin (LIPITOR) 40 MG tablet Take 1 tablet (40 mg total) by mouth daily at 6 PM. 03/13/16   Olam Idler, MD  benzonatate (TESSALON) 100 MG capsule Take 1 capsule (100 mg total) by mouth every 8 (eight) hours. 11/20/16   Melony Overly, MD  calcium-vitamin D (OSCAL 500/200 D-3) 500-200 MG-UNIT tablet Take 2 tablets by mouth daily with breakfast. 02/27/16   Olam Idler, MD  carvedilol (COREG) 25 MG tablet take 1 tablet by mouth twice a day with meals 10/01/16   Sela Hua, MD  doxycycline (VIBRAMYCIN) 100 MG capsule Take 1 capsule (100 mg total) by mouth 2 (two) times daily. 11/20/16   Melony Overly, MD  glucose blood (ONE TOUCH ULTRA TEST) test strip Check sugar 6 x daily 10/02/16   Sela Hua, MD  hydrALAZINE (APRESOLINE) 10 MG tablet Take 1 tablet (10 mg total) by mouth 3 (three) times daily. 10/24/16   Smiley Houseman, MD  hydrochlorothiazide (HYDRODIURIL) 25 MG tablet Take 1 tablet (25 mg total) by mouth daily. 06/11/16   Sela Hua, MD  Insulin Syringe-Needle U-100 (INSULIN SYRINGE 1CC/31GX5/16") 31G X  5/16" 1 ML MISC 1 each by Does not apply route See admin instructions. Use daily with insulin. 06/11/16   Sela Hua, MD  LANTUS 100 UNIT/ML injection INJECT 28 UNITS DAILY 10/05/16   Sela Hua, MD  lisinopril (PRINIVIL,ZESTRIL) 40 MG tablet take 1 tablet by mouth once daily Patient not taking: Reported on 11/20/2016 10/25/16   Sela Hua, MD  ONE TOUCH LANCETS MISC 1 each by Does not apply route See admin instructions. Check blood sugar once daily. 02/21/15   Olam Idler, MD  predniSONE (DELTASONE) 50 MG tablet Take 1 pill daily for 5 days. 11/20/16   Melony Overly, MD  spironolactone (ALDACTONE) 100 MG tablet Take 100  mg by mouth daily. 10/01/16   Historical Provider, MD    Family History Family History  Problem Relation Age of Onset  . Adopted: Yes    Social History Social History  Substance Use Topics  . Smoking status: Former Smoker    Packs/day: 1.00    Types: Cigarettes    Start date: 04/26/2014    Quit date: 07/23/2015  . Smokeless tobacco: Never Used     Comment: Started in her 35's with multiple quit attempts for about 1 year at a time.  . Alcohol use No     Allergies   Amlodipine; Clonidine derivatives; Metformin; and Augmentin [amoxicillin-pot clavulanate]   Review of Systems Review of Systems As in history of present illness  Physical Exam Triage Vital Signs ED Triage Vitals [11/20/16 1432]  Enc Vitals Group     BP 153/56     Pulse Rate 69     Resp 16     Temp 99.3 F (37.4 C)     Temp Source Oral     SpO2 96 %     Weight      Height      Head Circumference      Peak Flow      Pain Score      Pain Loc      Pain Edu?      Excl. in [place]?    No data found.   Updated Vital Signs BP 153/56 (BP Location: Left Arm)   Pulse 69   Temp 99.3 F (37.4 C) (Oral)   Resp 16   LMP  (LMP Unknown)   SpO2 96%   Visual Acuity Right Eye Distance:   Left Eye Distance:   Bilateral Distance:    Right Eye Near:   Left Eye Near:    Bilateral Near:     Physical Exam  Constitutional: She is oriented to person, place, and time. She appears well-developed and well-nourished. No distress.  HENT:  Mouth/Throat: Oropharynx is clear and moist. No oropharyngeal exudate.  Nose with slight crusting, otherwise clear  Neck: Neck supple.  Cardiovascular: Normal rate, regular rhythm and normal heart sounds.   No murmur heard. Pulmonary/Chest: Effort normal and breath sounds normal. No respiratory distress. She has no wheezes. She has no rales.  Lymphadenopathy:    She has no cervical adenopathy.  Neurological: She is alert and oriented to person, place, and time.     UC  Treatments / Results  Labs (all labs ordered are listed, but only abnormal results are displayed) Labs Reviewed - No data to display  EKG  EKG Interpretation None       Radiology Dg Chest 2 View  Result Date: 11/20/2016 CLINICAL DATA:  Cough for 5 days, congestion, some shortness of breath EXAM: CHEST  2 VIEW COMPARISON:  Chest x-ray of 10/10/2016 FINDINGS: No pneumonia or effusion is seen. There is mild peribronchial thickening which may indicate bronchitis. Mediastinal and hilar contours are unremarkable. The heart is within normal limits in size. There are degenerative changes in the lower thoracic spine. IMPRESSION: 1. No pneumonia or effusion. 2. Peribronchial thickening may indicate bronchitis. Electronically Signed   By: Ivar Drape M.D.   On: 11/20/2016 15:18    Procedures Procedures (including critical care time)  Medications Ordered in UC Medications - No data to display   Initial Impression / Assessment and Plan / UC Course  I have reviewed the triage vital signs and the nursing notes.  Pertinent labs & imaging results that were available during my care of the patient were reviewed by me and considered in my medical decision making (see chart for details).  Clinical Course     Treat with doxycycline and prednisone. Tessalon as needed for cough. Follow-up as needed.  Final Clinical Impressions(s) / UC Diagnoses   Final diagnoses:  Bronchitis    New Prescriptions New Prescriptions   BENZONATATE (TESSALON) 100 MG CAPSULE    Take 1 capsule (100 mg total) by mouth every 8 (eight) hours.   DOXYCYCLINE (VIBRAMYCIN) 100 MG CAPSULE    Take 1 capsule (100 mg total) by mouth 2 (two) times daily.   PREDNISONE (DELTASONE) 50 MG TABLET    Take 1 pill daily for 5 days.     Melony Overly, MD 11/20/16 [id number]

## 2016-11-22 ENCOUNTER — Encounter: Payer: Self-pay | Admitting: Family Medicine

## 2016-11-22 ENCOUNTER — Ambulatory Visit (INDEPENDENT_AMBULATORY_CARE_PROVIDER_SITE_OTHER): Payer: Medicare Other | Admitting: Family Medicine

## 2016-11-22 DIAGNOSIS — J209 Acute bronchitis, unspecified: Secondary | ICD-10-CM | POA: Diagnosis present

## 2016-11-22 MED ORDER — AMOXICILLIN 875 MG PO TABS: 875.0000 mg | ORAL_TABLET | Freq: Two times a day (BID) | ORAL | 0 refills | Status: AC

## 2016-11-22 NOTE — Assessment & Plan Note (Addendum)
Vital signs normal and patient reassuringly afebrile with normal oxygen saturation. Continues to have cough and has some rhonchorous noises in the right lower lung field. No respiratory distress. Has not tolerated doxycycline due to nausea and vomiting. Will add this to patient's allergy list. Although bronchitis likely viral in origin and previous normal chest x-ray on 12/26, patient at risk for bacterial pulmonary infections due to recent pneumonia and history of cigarette smoking.  -Stop doxycycline, start amoxicillin -Encouraged patient to start short course of prednisone -Tessalon Perles when necessary -Return precautions given

## 2016-11-22 NOTE — Patient Instructions (Signed)
Thank you for coming in today, it was so nice to see you! Today we talked about:    Bronchitis: I have written you for another prescription for an antibiotic called amoxicillin. Please take as prescribed. If you do not tolerate this medication, call the clinic to let us know. Follow up with [place] if you don't start feeling better in 5 days.   Go to the hospital if you are short of breath or have chest pain.   If you have any questions or concerns, please do not hesitate to call the office at [phone]. You can also message me directly via MyChart.   Sincerely,  Smitty Cords, MD

## 2016-11-22 NOTE — Progress Notes (Signed)
   Subjective:    Patient ID: Kimberly Carlson , female   DOB: 12/15/1945 , 70 y.o..   MRN: [id number]  HPI  Kimberly Carlson is here for a same day visit for Chief Complaint  Patient presents with  . Medication Reaction   Patient states that she was recently seen in the hospital on December 26 and diagnosed with bronchitis. She was given a prescription for doxycycline and prednisone. She went home and 20 minutes after she took the doxycycline she vomited. The vomit was nonbloody nonbilious. She thinks that she had a reaction to the medication but is not sure. Denies any rash, throat swelling, shortness of breath. She states that she is feeling a little bit better since being seen last in the hospital. She states that this morning as a first morning she did not have a recorded temperature, prior to this morning she had a temperature of 102 every day. Her appetite has been okay. She continues to cough. She has not taken any prednisone for fear that this would cause a reaction as well. She has been taking her other medications as prescribed. Denies any chronic pulmonary issues such as COPD or asthma. She recently quit smoking a little over a year ago. Denies any shortness of breath at rest but notes that she does have some shortness of breath with ambulation. Admits to some chest tightness when she coughs. Denies any hemoptysis.  Review of Systems: Per HPI. All other systems reviewed and are negative.  Medications: reviewed   Social Hx:  reports that she quit smoking about 16 months ago. Her smoking use included Cigarettes. She started smoking about 2 years ago. She smoked 1.00 pack per day. She has never used smokeless tobacco.   Objective:   BP 132/62   Pulse 69   Temp 98 F (36.7 C) (Oral)   Ht 5\' 3"  (1.6 m)   Wt 199 lb 12.8 oz (90.6 kg)   LMP  (LMP Unknown)   SpO2 97%   BMI 35.39 kg/m  Physical Exam  Gen: NAD, alert, cooperative with exam, well-appearing HEENT: NCAT, PERRL,  clear conjunctiva, oropharynx clear, supple neck, No lymphadenopathy Cardiac: Regular rate and rhythm, normal S1/S2, capillary refill brisk  Respiratory: Normal work of breathing, rhonchorous noises in the right lower lung field on inspiration and expiration, no stridor, no crackles, no wheezes Skin: no rashes, normal turgor  Neurological: no gross deficits.  Psych: good insight, normal mood and affect  Assessment & Plan:  Acute bronchitis Vital signs normal and patient reassuringly afebrile with normal oxygen saturation. Continues to have cough and has some rhonchorous noises in the right lower lung field. No respiratory distress. Has not tolerated doxycycline due to nausea and vomiting. Will add this to patient's allergy list. Although bronchitis likely viral in origin and previous normal chest x-ray on 12/26, patient at risk for bacterial pulmonary infections due to recent pneumonia and history of cigarette smoking.  -Stop doxycycline, start amoxicillin -Encouraged patient to start short course of prednisone -Tessalon Perles when necessary -Return precautions given   Smitty Cords, MD [place], PGY-2

## 2016-12-03 ENCOUNTER — Other Ambulatory Visit: Payer: Self-pay | Admitting: Internal Medicine

## 2016-12-03 DIAGNOSIS — I10 Essential (primary) hypertension: Secondary | ICD-10-CM

## 2017-01-02 ENCOUNTER — Other Ambulatory Visit: Payer: Self-pay | Admitting: Internal Medicine

## 2017-01-02 DIAGNOSIS — Z794 Long term (current) use of insulin: Principal | ICD-10-CM

## 2017-01-02 DIAGNOSIS — E1165 Type 2 diabetes mellitus with hyperglycemia: Secondary | ICD-10-CM

## 2017-01-30 ENCOUNTER — Other Ambulatory Visit: Payer: Self-pay | Admitting: Internal Medicine

## 2017-01-30 DIAGNOSIS — I10 Essential (primary) hypertension: Secondary | ICD-10-CM

## 2017-02-04 ENCOUNTER — Other Ambulatory Visit: Payer: Self-pay | Admitting: Internal Medicine

## 2017-02-04 ENCOUNTER — Other Ambulatory Visit: Payer: Self-pay | Admitting: Family Medicine

## 2017-02-04 ENCOUNTER — Encounter: Payer: Self-pay | Admitting: Internal Medicine

## 2017-02-04 DIAGNOSIS — I10 Essential (primary) hypertension: Secondary | ICD-10-CM

## 2017-02-05 ENCOUNTER — Other Ambulatory Visit: Payer: Self-pay | Admitting: *Deleted

## 2017-02-05 ENCOUNTER — Other Ambulatory Visit: Payer: Self-pay | Admitting: Internal Medicine

## 2017-02-05 DIAGNOSIS — Z794 Long term (current) use of insulin: Principal | ICD-10-CM

## 2017-02-05 DIAGNOSIS — I10 Essential (primary) hypertension: Secondary | ICD-10-CM

## 2017-02-05 DIAGNOSIS — E1165 Type 2 diabetes mellitus with hyperglycemia: Secondary | ICD-10-CM

## 2017-02-05 MED ORDER — "INSULIN SYRINGE 31G X 5/16"" 1 ML MISC": 1.0000 | 11 refills | Status: DC

## 2017-02-05 MED ORDER — HYDROCHLOROTHIAZIDE 25 MG PO TABS: 25.0000 mg | ORAL_TABLET | Freq: Every day | ORAL | 0 refills | Status: DC

## 2017-02-05 MED ORDER — INSULIN GLARGINE 100 UNIT/ML ~~LOC~~ SOLN: SUBCUTANEOUS | 2 refills | Status: DC

## 2017-02-05 MED ORDER — CARVEDILOL 25 MG PO TABS: 25.0000 mg | ORAL_TABLET | Freq: Two times a day (BID) | ORAL | 2 refills | Status: DC

## 2017-02-06 ENCOUNTER — Other Ambulatory Visit: Payer: Self-pay | Admitting: *Deleted

## 2017-02-06 MED ORDER — HYDRALAZINE HCL 10 MG PO TABS: 10.0000 mg | ORAL_TABLET | Freq: Three times a day (TID) | ORAL | 0 refills | Status: DC

## 2017-02-07 ENCOUNTER — Ambulatory Visit (INDEPENDENT_AMBULATORY_CARE_PROVIDER_SITE_OTHER): Payer: Medicare Other | Admitting: Family Medicine

## 2017-02-07 ENCOUNTER — Ambulatory Visit (HOSPITAL_COMMUNITY)
Admission: RE | Admit: 2017-02-07 | Discharge: 2017-02-07 | Disposition: A | Discharge status: Home / Self Care | Payer: Medicare Other | Source: Ambulatory Visit | Attending: Family Medicine | Admitting: Family Medicine

## 2017-02-07 ENCOUNTER — Encounter: Payer: Self-pay | Admitting: Family Medicine

## 2017-02-07 VITALS — BP 180/66 | HR 77 | Temp 98.0°F | Wt 206.4 lb

## 2017-02-07 DIAGNOSIS — I10 Essential (primary) hypertension: Secondary | ICD-10-CM | POA: Diagnosis not present

## 2017-02-07 DIAGNOSIS — R0609 Other forms of dyspnea: Secondary | ICD-10-CM

## 2017-02-07 DIAGNOSIS — R0600 Dyspnea, unspecified: Secondary | ICD-10-CM

## 2017-02-07 LAB — CBC
HCT: 31.6 % — ABNORMAL LOW (ref 35.0–45.0)
Hemoglobin: 10.4 g/dL — ABNORMAL LOW (ref 11.7–15.5)
MCH: 30.3 pg (ref 27.0–33.0)
MCHC: 32.9 g/dL (ref 32.0–36.0)
MCV: 92.1 fL (ref 80.0–100.0)
MPV: 12.5 fL (ref 7.5–12.5)
Platelets: 211 10*3/uL (ref 140–400)
RBC: 3.43 MIL/uL — ABNORMAL LOW (ref 3.80–5.10)
RDW: 13.2 % (ref 11.0–15.0)
WBC: 10.4 10*3/uL (ref 3.8–10.8)

## 2017-02-07 NOTE — Progress Notes (Signed)
Subjective: CC: SOB HPI: Patient is a 71 y.o. female with a past medical history of recent pneumonia and bronchitis presenting to clinic today for a same day appt for SOB.  Patient notes LE swelling for a least a year and DOE for months. She notes DOE starts after walking about 10 steps and is getting worse.  No SOB at rest.  No chest pain, however she notes intermittently feelings of "fluttering" which last happened several days ago. Some abdominal "heaviness" intermittently for the last 2-3 months, none currently She sleeps with 2 pillows which is stable, no PND.  No fevers, cough, rhinorrhea, nasal congestion, otalgias, abdominal pain. She notes mild fatigue which is stable.   No recent surgeries. No recent immobility, no hemoptysis, no known malignancy.   Social History: non-smoker    ROS: All other systems reviewed and are negative.  Past Medical History Patient Active Problem List   Diagnosis Date Noted  . Dyspnea on exertion 02/07/2017  . Acute bronchitis 11/22/2016  . Community acquired pneumonia 10/10/2016  . Pneumonia 10/10/2016  . Hypovitaminosis D 02/28/2016  . Low back pain 02/27/2016  . Cough 02/27/2016  . Cataract 02/27/2016  . Neck pain on left side 12/08/2015  . Edema of both legs 08/11/2015  . Anemia 08/10/2015  . Hematuria, undiagnosed cause 06/09/2015  . Headache 06/08/2015  . Hypertensive urgency 06/08/2015  . Cocaine use 06/08/2015  . Noncompliance with medications   . Knee pain, bilateral 03/24/2014  . UTI (urinary tract infection) 05/27/2013  . Resting tremor 05/07/2012  . DIPLOPIA 11/28/2010  . DEGENERATIVE DISC DISEASE 11/09/2009  . History of tobacco use 09/03/2009  . Obesity 07/08/2009  . HERNIATED LUMBAR DISC 05/14/2007  . DM2 (diabetes mellitus, type 2) ([place]) 01/23/2007  . HYPERCHOLESTEROLEMIA 01/23/2007  . HYPERTENSION, BENIGN SYSTEMIC 01/23/2007    Medications- reviewed and updated Current Outpatient Prescriptions  Medication  Sig Dispense Refill  . aspirin (ASPIRIN CHILDRENS) 81 MG chewable tablet Chew 81 mg by mouth daily.      Marland Kitchen atorvastatin (LIPITOR) 40 MG tablet Take 1 tablet (40 mg total) by mouth daily at 6 PM. 90 tablet 3  . benzonatate (TESSALON) 100 MG capsule Take 1 capsule (100 mg total) by mouth every 8 (eight) hours. 21 capsule 0  . calcium-vitamin D (OSCAL 500/200 D-3) 500-200 MG-UNIT tablet Take 2 tablets by mouth daily with breakfast. 180 tablet 3  . carvedilol (COREG) 25 MG tablet Take 1 tablet (25 mg total) by mouth 2 (two) times daily with a meal. 60 tablet 2  . glucose blood (ONE TOUCH ULTRA TEST) test strip Check sugar 6 x daily 200 each 3  . hydrALAZINE (APRESOLINE) 10 MG tablet Take 1 tablet (10 mg total) by mouth 3 (three) times daily. 90 tablet 0  . hydrochlorothiazide (HYDRODIURIL) 25 MG tablet take 1 tablet by mouth once daily 90 tablet 0  . hydrochlorothiazide (HYDRODIURIL) 25 MG tablet Take 1 tablet (25 mg total) by mouth daily. 90 tablet 0  . insulin glargine (LANTUS) 100 UNIT/ML injection inject 28 units once daily 10 mL 2  . Insulin Syringe-Needle U-100 (INSULIN SYRINGE 1CC/31GX5/16") 31G X 5/16" 1 ML MISC 1 each by Does not apply route See admin instructions. Use daily with insulin. 100 each 11  . lisinopril (PRINIVIL,ZESTRIL) 40 MG tablet take 1 tablet by mouth once daily (Patient not taking: Reported on 11/20/2016) 90 tablet 3  . ONE TOUCH LANCETS MISC 1 each by Does not apply route See admin instructions. Check blood  sugar once daily. 200 each 11  . predniSONE (DELTASONE) 50 MG tablet Take 1 pill daily for 5 days. 5 tablet 0  . spironolactone (ALDACTONE) 100 MG tablet Take 100 mg by mouth daily.  0   No current facility-administered medications for this visit.     Objective: Office vital signs reviewed. BP (!) 180/66   Pulse 77   Temp 98 F (36.7 C) (Oral)   Wt 206 lb 6.4 oz (93.6 kg)   LMP  (LMP Unknown)   SpO2 98%   BMI 36.56 kg/m    Physical Examination:  General:  Awake, alert, well- nourished, NAD ENMT:  TMs intact, normal light reflex, no erythema, no bulging. Nasal turbinates moist. MMM, Oropharynx clear without erythema or tonsillar exudate/hypertrophy Eyes: Conjunctiva non-injected. Cardio: RRR, no m/r/g noted. No thrills. No JVD appreciated. Pulm: No increased WOB.  CTAB, without wheezes, rhonchi or crackles noted. Patient able to speak in full sentences. With ambulation, patient endorses shortness of breath, however her oxygen saturation remains 97% or above. Extremities: 1-2+ pitting edema up to the knee bilaterally  11/2015 echo: EF 65-70%, G1DDD, PA peak pressure of 57mm Hg   EKG 3/15: HR 68, no ST elevation or depression. No T wave inversions.  Assessment/Plan: Dyspnea on exertion Ongoing for several months. Patient is well appearing on exam and her O2 saturation was stable. Last hemoglobin level was 8.5 in 11/18. She last had an echo 1/17 which showed G1DDD and a PA peak pressure of 19mm Hg. PE low on the list given duration, she is not tachycardic, O2 sats normal, and no risk factors.  - will get repeat CXR given h/o recent PNA and other infections - repeat echocardiogram - BNP - CBC - discussed strict return precautions, pt to f/u with her PCP in 1-2 weeks or sooner a needed.  HYPERTENSION, BENIGN SYSTEMIC BP not controlled, but appears to be around her baseline. No headaches or vision change currently. - CMET today - f/u with PCP for further management   Orders Placed This Encounter  Procedures  . DG Chest 2 View    Standing Status:   Future    Standing Expiration Date:   04/09/2018    Order Specific Question:   Reason for Exam (SYMPTOM  OR DIAGNOSIS REQUIRED)    Answer:   DOE, intermittent palpitations    Order Specific Question:   Preferred imaging location?    Answer:   Internal  . CBC  . COMPLETE METABOLIC PANEL WITH GFR  . Brain natriuretic peptide  . EKG 12-Lead  . ECHOCARDIOGRAM COMPLETE    Standing Status:   Future      Standing Expiration Date:   05/10/2018    Order Specific Question:   Where should this test be performed    Answer:   [facility])    Order Specific Question:   Does the patient weigh less than or greater than 250 lbs?    Answer:   Patient weighs less than 250 lbs    Order Specific Question:   Complete or Limited study?    Answer:   Complete    Order Specific Question:   Does the patient have a known history of hypersensitivity to Perflutren (aka Scientist, research (medical) for echocardiograms - CHECK ALLERGIES)    Answer:   No    Order Specific Question:   ADMINISTER PERFLUTERN    Answer:   ADMINISTER PERFLUTREN    Order Specific Question:   Expected Date:  Answer:   1 week    Order Specific Question:   Reason for exam-Echo    Answer:   Dyspnea  786.09 / R06.00    No orders of the defined types were placed in this encounter.   Archie Patten PGY-3, [place]

## 2017-02-07 NOTE — Patient Instructions (Signed)
Shortness of Breath, Adult  Shortness of breath means you have trouble breathing. Your lungs are organs for breathing.  Follow these instructions at home:  Pay attention to any changes in your symptoms. Take these actions to help with your condition:  ? Do not smoke. Smoking can cause shortness of breath. If you need help to quit smoking, ask your doctor.  ? Avoid things that can make it harder to breathe, such as:  ? Mold.  ? Dust.  ? Air pollution.  ? Chemical smells.  ? Things that can cause allergy symptoms (allergens), if you have allergies.  ? Keep your living space clean and free of mold and dust.  ? Rest as needed. Slowly return to your usual activities.  ? Take over-the-counter and prescription medicines, including oxygen and inhaled medicines, only as told by your doctor.  ? Keep all follow-up visits as told by your doctor. This is important.  Contact a doctor if:  ? Your condition does not get better as soon as expected.  ? You have a hard time doing your normal activities, even after you rest.  ? You have new symptoms.  Get help right away if:  ? You have trouble breathing when you are resting.  ? You feel light-headed or you faint.  ? You have a cough that is not helped by medicines.  ? You cough up blood.  ? You have pain with breathing.  ? You have pain in your chest, arms, shoulders, or belly (abdomen).  ? You have a fever.  ? You cannot walk up stairs.  ? You cannot exercise the way you normally do.  This information is not intended to replace advice given to you by your health care provider. Make sure you discuss any questions you have with your health care provider.  Document Released: 04/30/2008 Document Revised: 11/29/2016 Document Reviewed: 11/29/2016  Elsevier Interactive Patient Education ? 2017 [employer]

## 2017-02-07 NOTE — Progress Notes (Deleted)
Subjective: CC:*** HPI: Patient is a 71 y.o. female with a past medical history of *** presenting to clinic today for shortness of breath.    Social History: ***  Flu Vaccine: {YES/NO/WILD ZSWFU:93235}  Tdap Vaccine: {YES/NO/WILD TDDUK:02542}  - every 98yrs - (<3 lifetime doses or unknown): all wounds -- look up need for Tetanus IG - (>=3 lifetime doses): clean/minor wound if >53yrs from previous; all other wounds if >51yrs from previous Zoster Vaccine: {YES/NO/WILD CARDS:18581} (those >50yo, once) Pneumonia Vaccine: {YES/NO/WILD HCWCB:76283} (those w/ risk factors) - (<4yr) Both: Immunocompromised, cochlear implant, CSF leak, asplenic, sickle cell, Chronic Renal Failure - (<84yr) PPSV-23 only: Heart dz, lung disease, DM, tobacco abuse, alcoholism, cirrhosis/liver disease. - (>63yr): PPSV13 then PPSV23 in 6-12mths;  - (>29yr): repeat PPSV23 once if pt received prior to 71yo and 16yrs have passed   ROS: All other systems reviewed and are negative.  Past Medical History Patient Active Problem List   Diagnosis Date Noted  . Acute bronchitis 11/22/2016  . Community acquired pneumonia 10/10/2016  . Pneumonia 10/10/2016  . Hypovitaminosis D 02/28/2016  . Low back pain 02/27/2016  . Cough 02/27/2016  . Cataract 02/27/2016  . Neck pain on left side 12/08/2015  . Edema of both legs 08/11/2015  . Anemia 08/10/2015  . Hematuria, undiagnosed cause 06/09/2015  . Headache 06/08/2015  . Hypertensive urgency 06/08/2015  . Cocaine use 06/08/2015  . Noncompliance with medications   . Knee pain, bilateral 03/24/2014  . UTI (urinary tract infection) 05/27/2013  . Resting tremor 05/07/2012  . DIPLOPIA 11/28/2010  . DEGENERATIVE DISC DISEASE 11/09/2009  . History of tobacco use 09/03/2009  . Obesity 07/08/2009  . HERNIATED LUMBAR DISC 05/14/2007  . DM2 (diabetes mellitus, type 2) ([place]) 01/23/2007  . HYPERCHOLESTEROLEMIA 01/23/2007  . HYPERTENSION, BENIGN SYSTEMIC 01/23/2007     Medications- reviewed and updated Current Outpatient Prescriptions  Medication Sig Dispense Refill  . aspirin (ASPIRIN CHILDRENS) 81 MG chewable tablet Chew 81 mg by mouth daily.      Marland Kitchen atorvastatin (LIPITOR) 40 MG tablet Take 1 tablet (40 mg total) by mouth daily at 6 PM. 90 tablet 3  . benzonatate (TESSALON) 100 MG capsule Take 1 capsule (100 mg total) by mouth every 8 (eight) hours. 21 capsule 0  . calcium-vitamin D (OSCAL 500/200 D-3) 500-200 MG-UNIT tablet Take 2 tablets by mouth daily with breakfast. 180 tablet 3  . carvedilol (COREG) 25 MG tablet Take 1 tablet (25 mg total) by mouth 2 (two) times daily with a meal. 60 tablet 2  . glucose blood (ONE TOUCH ULTRA TEST) test strip Check sugar 6 x daily 200 each 3  . hydrALAZINE (APRESOLINE) 10 MG tablet Take 1 tablet (10 mg total) by mouth 3 (three) times daily. 90 tablet 0  . hydrochlorothiazide (HYDRODIURIL) 25 MG tablet take 1 tablet by mouth once daily 90 tablet 0  . hydrochlorothiazide (HYDRODIURIL) 25 MG tablet Take 1 tablet (25 mg total) by mouth daily. 90 tablet 0  . insulin glargine (LANTUS) 100 UNIT/ML injection inject 28 units once daily 10 mL 2  . Insulin Syringe-Needle U-100 (INSULIN SYRINGE 1CC/31GX5/16") 31G X 5/16" 1 ML MISC 1 each by Does not apply route See admin instructions. Use daily with insulin. 100 each 11  . lisinopril (PRINIVIL,ZESTRIL) 40 MG tablet take 1 tablet by mouth once daily (Patient not taking: Reported on 11/20/2016) 90 tablet 3  . ONE TOUCH LANCETS MISC 1 each by Does not apply route See admin instructions. Check blood sugar  once daily. 200 each 11  . predniSONE (DELTASONE) 50 MG tablet Take 1 pill daily for 5 days. 5 tablet 0  . spironolactone (ALDACTONE) 100 MG tablet Take 100 mg by mouth daily.  0   No current facility-administered medications for this visit.     Objective: Office vital signs reviewed. LMP  (LMP Unknown)    Physical Examination:  General: Awake, alert, *** nourished,  NAD ENMT:  TMs intact, normal light reflex, no erythema, no bulging. Nasal turbinates moist. MMM, Oropharynx clear without erythema or tonsillar exudate/hypertrophy Eyes: Conjunctiva non-injected. PERRL.  Cardio: RRR, no m/r/g noted.  Pulm: No increased WOB.  CTAB, without wheezes, rhonchi or crackles noted.  GI: soft, NT/ND,+BS x4, no hepatomegaly, no splenomegaly GU: deffered Extremities: WWP, No edema, cyanosis or clubbing; +*** pulses bilaterally MSK: Normal gait and station Skin: dry, intact, no rashes or lesions Neuro: Strength and sensation grossly intact, DTRs ***/4  Assessment/Plan: No problem-specific Assessment & Plan notes found for this encounter.   No orders of the defined types were placed in this encounter.   No orders of the defined types were placed in this encounter.   Archie Patten PGY-3, [place]

## 2017-02-07 NOTE — Assessment & Plan Note (Signed)
Ongoing for several months. Patient is well appearing on exam and her O2 saturation was stable. Last hemoglobin level was 8.5 in 11/18. She last had an echo 1/17 which showed G1DDD and a PA peak pressure of 74mm Hg. PE low on the list given duration, she is not tachycardic, O2 sats normal, and no risk factors.  - will get repeat CXR given h/o recent PNA and other infections - repeat echocardiogram - BNP - CBC - discussed strict return precautions, pt to f/u with her PCP in 1-2 weeks or sooner a needed.

## 2017-02-07 NOTE — Assessment & Plan Note (Signed)
BP not controlled, but appears to be around her baseline. No headaches or vision change currently. - CMET today - f/u with PCP for further management

## 2017-02-08 LAB — COMPLETE METABOLIC PANEL WITH GFR
ALT: 15 U/L (ref 6–29)
AST: 15 U/L (ref 10–35)
Albumin: 3.4 g/dL — ABNORMAL LOW (ref 3.6–5.1)
Alkaline Phosphatase: 107 U/L (ref 33–130)
BUN: 43 mg/dL — ABNORMAL HIGH (ref 7–25)
CO2: 24 mmol/L (ref 20–31)
Calcium: 9.1 mg/dL (ref 8.6–10.4)
Chloride: 106 mmol/L (ref 98–110)
Creat: 2.02 mg/dL — ABNORMAL HIGH (ref 0.60–0.93)
GFR, Est African American: 28 mL/min — ABNORMAL LOW (ref >=60)
GFR, Est Non African American: 24 mL/min — ABNORMAL LOW (ref >=60)
Glucose, Bld: 171 mg/dL — ABNORMAL HIGH (ref 65–99)
Potassium: 4.2 mmol/L (ref 3.5–5.3)
Sodium: 140 mmol/L (ref 135–146)
Total Bilirubin: 0.5 mg/dL (ref 0.2–1.2)
Total Protein: 6.1 g/dL (ref 6.1–8.1)

## 2017-02-08 LAB — BRAIN NATRIURETIC PEPTIDE: Brain Natriuretic Peptide: 17 pg/mL (ref <100)

## 2017-02-11 ENCOUNTER — Ambulatory Visit (HOSPITAL_COMMUNITY)
Admission: RE | Admit: 2017-02-11 | Discharge: 2017-02-11 | Disposition: A | Discharge status: Home / Self Care | Payer: Medicare Other | Source: Ambulatory Visit | Attending: Family Medicine | Admitting: Family Medicine

## 2017-02-11 ENCOUNTER — Telehealth: Payer: Self-pay | Admitting: Family Medicine

## 2017-02-11 DIAGNOSIS — R0609 Other forms of dyspnea: Secondary | ICD-10-CM

## 2017-02-11 DIAGNOSIS — I10 Essential (primary) hypertension: Secondary | ICD-10-CM | POA: Diagnosis not present

## 2017-02-11 DIAGNOSIS — R0600 Dyspnea, unspecified: Secondary | ICD-10-CM

## 2017-02-11 DIAGNOSIS — I351 Nonrheumatic aortic (valve) insufficiency: Secondary | ICD-10-CM | POA: Insufficient documentation

## 2017-02-11 NOTE — Telephone Encounter (Signed)
Please have her f/u blue team.  Thanks, Archie Patten, MD [facility] Resident  02/11/2017, 3:26 PM

## 2017-02-11 NOTE — Progress Notes (Signed)
  Echocardiogram 2D Echocardiogram has been performed.  Kimberly Carlson 02/11/2017, 3:49 PM

## 2017-02-11 NOTE — Telephone Encounter (Signed)
Patient's kidney function not at her baseline; she may need to stop the Aldactone (one of her medications). Please have her f/u with her PCP sometime this week for her shortness of breath.  All her other labs are unremarkable.   Thanks, Archie Patten, MD [facility] Resident  02/11/2017, 9:56 AM

## 2017-02-12 ENCOUNTER — Telehealth: Payer: Self-pay | Admitting: Internal Medicine

## 2017-02-12 NOTE — Telephone Encounter (Signed)
Left message on voicemail for patient to call back. 

## 2017-02-12 NOTE — Telephone Encounter (Signed)
Pt is returning the doctors call. She would like to apeak to a nurse about her results. jw

## 2017-02-13 ENCOUNTER — Other Ambulatory Visit: Payer: Self-pay | Admitting: *Deleted

## 2017-02-13 DIAGNOSIS — E7800 Pure hypercholesterolemia, unspecified: Secondary | ICD-10-CM

## 2017-02-13 MED ORDER — ATORVASTATIN CALCIUM 40 MG PO TABS: 40.0000 mg | ORAL_TABLET | Freq: Every day | ORAL | 3 refills | Status: DC

## 2017-02-13 NOTE — Telephone Encounter (Signed)
Patient is aware of labs and also made an appointment for Friday to see Dr. Andy Gauss. Jazmin Hartsell,CMA

## 2017-02-15 ENCOUNTER — Encounter: Payer: Self-pay | Admitting: Family Medicine

## 2017-02-15 ENCOUNTER — Ambulatory Visit (INDEPENDENT_AMBULATORY_CARE_PROVIDER_SITE_OTHER): Payer: Medicare Other | Admitting: Family Medicine

## 2017-02-15 VITALS — BP 138/70 | HR 72 | Temp 98.4°F | Wt 206.0 lb

## 2017-02-15 DIAGNOSIS — Z794 Long term (current) use of insulin: Secondary | ICD-10-CM

## 2017-02-15 DIAGNOSIS — N183 Chronic kidney disease, stage 3 unspecified: Secondary | ICD-10-CM

## 2017-02-15 DIAGNOSIS — E1122 Type 2 diabetes mellitus with diabetic chronic kidney disease: Secondary | ICD-10-CM

## 2017-02-15 DIAGNOSIS — R0602 Shortness of breath: Secondary | ICD-10-CM

## 2017-02-15 LAB — POCT GLYCOSYLATED HEMOGLOBIN (HGB A1C): Hemoglobin A1C: 7.4

## 2017-02-15 NOTE — Patient Instructions (Signed)
It was great seeing you today! We have addressed the following issues today  1. We will check your kidney function today and also send you to get a chest X ray .  2. Please follow up with [place] next week to discuss result of your shortness of breath.  If we did any lab work today, and the results require attention, either me or my nurse will get in touch with you. If everything is normal, you will get a letter in mail and a message via . If you don't hear from [place] in two weeks, please give [place] a call. Otherwise, we look forward to seeing you again at your next visit. If you have any questions or concerns before then, please call the clinic at [phone].  Please bring all your medications to every doctors visit  Sign up for My Chart to have easy access to your labs results, and communication with your Primary care physician.    Please check-out at the front desk before leaving the clinic.    Take Care,   Dr. Andy Gauss  Diabetes Mellitus and Food It is important for you to manage your blood sugar (glucose) level. Your blood glucose level can be greatly affected by what you eat. Eating healthier foods in the appropriate amounts throughout the day at about the same time each day will help you control your blood glucose level. It can also help slow or prevent worsening of your diabetes mellitus. Healthy eating may even help you improve the level of your blood pressure and reach or maintain a healthy weight. General recommendations for healthful eating and cooking habits include:  Eating meals and snacks regularly. Avoid going long periods of time without eating to lose weight.  Eating a diet that consists mainly of plant-based foods, such as fruits, vegetables, nuts, legumes, and whole grains.  Using low-heat cooking methods, such as baking, instead of high-heat cooking methods, such as deep frying. Work with your dietitian to make sure you understand how to use the Nutrition Facts information on  food labels. How can food affect me? Carbohydrates  Carbohydrates affect your blood glucose level more than any other type of food. Your dietitian will help you determine how many carbohydrates to eat at each meal and teach you how to count carbohydrates. Counting carbohydrates is important to keep your blood glucose at a healthy level, especially if you are using insulin or taking certain medicines for diabetes mellitus. Alcohol  Alcohol can cause sudden decreases in blood glucose (hypoglycemia), especially if you use insulin or take certain medicines for diabetes mellitus. Hypoglycemia can be a life-threatening condition. Symptoms of hypoglycemia (sleepiness, dizziness, and disorientation) are similar to symptoms of having too much alcohol. If your health care provider has given you approval to drink alcohol, do so in moderation and use the following guidelines:  Women should not have more than one drink per day, and men should not have more than two drinks per day. One drink is equal to:  12 oz of beer.  5 oz of wine.  1 oz of hard liquor.  Do not drink on an empty stomach.  Keep yourself hydrated. Have water, diet soda, or unsweetened iced tea.  Regular soda, juice, and other mixers might contain a lot of carbohydrates and should be counted. What foods are not recommended? As you make food choices, it is important to remember that all foods are not the same. Some foods have fewer nutrients per serving than other foods, even though  they might have the same number of calories or carbohydrates. It is difficult to get your body what it needs when you eat foods with fewer nutrients. Examples of foods that you should avoid that are high in calories and carbohydrates but low in nutrients include:  Trans fats (most processed foods list trans fats on the Nutrition Facts label).  Regular soda.  Juice.  Candy.  Sweets, such as cake, pie, doughnuts, and cookies.  Fried foods. What foods  can I eat? Eat nutrient-rich foods, which will nourish your body and keep you healthy. The food you should eat also will depend on several factors, including:  The calories you need.  The medicines you take.  Your weight.  Your blood glucose level.  Your blood pressure level.  Your cholesterol level. You should eat a variety of foods, including:  Protein.  Lean cuts of meat.  Proteins low in saturated fats, such as fish, egg whites, and beans. Avoid processed meats.  Fruits and vegetables.  Fruits and vegetables that may help control blood glucose levels, such as apples, mangoes, and yams.  Dairy products.  Choose fat-free or low-fat dairy products, such as milk, yogurt, and cheese.  Grains, bread, pasta, and rice.  Choose whole grain products, such as multigrain bread, whole oats, and brown rice. These foods may help control blood pressure.  Fats.  Foods containing healthful fats, such as nuts, avocado, olive oil, canola oil, and fish. Does everyone with diabetes mellitus have the same meal plan? Because every person with diabetes mellitus is different, there is not one meal plan that works for everyone. It is very important that you meet with a dietitian who will help you create a meal plan that is just right for you. This information is not intended to replace advice given to you by your health care provider. Make sure you discuss any questions you have with your health care provider. Document Released: 08/09/2005 Document Revised: 04/19/2016 Document Reviewed: 10/09/2013 Elsevier Interactive Patient Education  2017 [employer].

## 2017-02-16 LAB — BASIC METABOLIC PANEL
BUN/Creatinine Ratio: 24 (ref 12–28)
BUN: 34 mg/dL — ABNORMAL HIGH (ref 8–27)
CO2: 26 mmol/L (ref 18–29)
Calcium: 9.4 mg/dL (ref 8.7–10.3)
Chloride: 102 mmol/L (ref 96–106)
Creatinine, Ser: 1.39 mg/dL — ABNORMAL HIGH (ref 0.57–1.00)
GFR calc Af Amer: 43 mL/min/{1.73_m2} — ABNORMAL LOW (ref >59)
GFR calc non Af Amer: 38 mL/min/{1.73_m2} — ABNORMAL LOW (ref >59)
Glucose: 125 mg/dL — ABNORMAL HIGH (ref 65–99)
Potassium: 4.5 mmol/L (ref 3.5–5.2)
Sodium: 143 mmol/L (ref 134–144)

## 2017-02-17 NOTE — Progress Notes (Signed)
   Subjective:    Patient ID: Kimberly Carlson, female    DOB: 02-Mar-1946, 71 y.o.   MRN: [id number]   CC: Follow up for shortness of breath and worsening kidney function  HPI: Patient is 71 yo female who presents today for follow up on shortness of breath and recent worsening in kidney function. Patient was seen in clinic on 3/15 for SOB after recent hospitalization for pneumonia and bronchitis and also lower extremities edema. CBC, CMP, CXR and ECHO were ordered for patient to further evaluate SOB. Creatinine was elevated from baseline and patient was asked to stop aldactone and return to clinic to follow up on SOB this week. Patient continue to endorses significant SOB walking a few steps or going to her mailbox. Patient reports increase lower extremities swelling worst at night but no PND or orthopnea. Patient denies cough, chest pain, abdominal pain or significant weight gain. Patient had Echo done but did not go for her CXR.  Smoking status reviewed   ROS: all other systems were reviewed and are negative other than in the HPI   Past Medical History:  Diagnosis Date  . Back pain    herniated disc lower back  . Diabetes mellitus without complication ([place])   . Hypertension    Past Surgical History:  Procedure Laterality Date  . ABDOMINAL HYSTERECTOMY      Objective:  BP 138/70   Pulse 72   Temp 98.4 F (36.9 C) (Oral)   Wt 206 lb (93.4 kg)   LMP  (LMP Unknown)   SpO2 97%   BMI 36.49 kg/m   Vitals and nursing note reviewed  General: NAD, pleasant, able to participate in exam Cardiac: RRR, normal heart sounds, no murmurs. 2+ radial and PT pulses bilaterally, no JVD Respiratory: CTAB, normal effort, No wheezes, rales or rhonchi Abdomen: soft, nontender, nondistended, no hepatic or splenomegaly, +BS Extremities: Mild bilateral lower extremity edema Skin: warm and dry, no rashes noted Neuro: alert and oriented x4, no focal deficits Psych: Normal affect and  mood   Assessment & Plan:   #Shortness of breath, chronic, stable Patient continue to endorse SOB with ambulation in the setting of recent pneumonia and bronchits diagnosis. Given bilateral lower extremities swelling and SOB echo was ordered and show normal EF 60-65%, with no abnormalities in wall motion, G1DD has been chronic but of low concern. Patient did not go for her CXR has ordered by Dr.Dorsey last week. Patient appears to be at baseline and denies any worsening in her breathing since last week. Possibly pneumonia though unlikely given no fever, cough, or other signs of infection. Bronchitis or other airway disease such as COPD could be contributing since patient was a long time smoker. --Reinforce need for CXR, will follow up to make sure it is done --Consider referral to Cardiology and lung function test --Follow up results with PCP  # Worsening kidney function, acute  Patient had an increase of her creatinine from 1.72 (09/2016) to 2.02 (01/2017). Patient has recently stopped aldactone as a possible offending agent in increase in creatinine. Will check kidney function. --Repeat BMP --Will follow up with PCP  #T2DM A1c 7.4 up from 6.4. Will follow up with PCP.  Marjie Skiff, MD Family Medicine Resident PGY-1

## 2017-02-18 ENCOUNTER — Emergency Department (HOSPITAL_COMMUNITY)
Admission: EM | Admit: 2017-02-18 | Disposition: A | Discharge status: Home / Self Care | Payer: Medicare Other | Source: Home / Self Care

## 2017-02-18 ENCOUNTER — Ambulatory Visit
Admission: RE | Admit: 2017-02-18 | Discharge: 2017-02-18 | Disposition: A | Discharge status: Home / Self Care | Payer: Medicare Other | Source: Other Acute Inpatient Hospital | Attending: Family Medicine | Admitting: Family Medicine

## 2017-02-18 ENCOUNTER — Ambulatory Visit (HOSPITAL_COMMUNITY)
Admission: RE | Admit: 2017-02-18 | Discharge: 2017-02-18 | Disposition: A | Discharge status: Home / Self Care | Payer: Medicare Other | Source: Ambulatory Visit | Attending: Family Medicine | Admitting: Family Medicine

## 2017-02-18 DIAGNOSIS — R0602 Shortness of breath: Secondary | ICD-10-CM | POA: Insufficient documentation

## 2017-02-25 ENCOUNTER — Ambulatory Visit (INDEPENDENT_AMBULATORY_CARE_PROVIDER_SITE_OTHER): Payer: Medicare Other | Admitting: Internal Medicine

## 2017-02-25 ENCOUNTER — Encounter: Payer: Self-pay | Admitting: Internal Medicine

## 2017-02-25 DIAGNOSIS — R0609 Other forms of dyspnea: Secondary | ICD-10-CM | POA: Diagnosis not present

## 2017-02-25 DIAGNOSIS — R0600 Dyspnea, unspecified: Secondary | ICD-10-CM

## 2017-02-25 NOTE — Assessment & Plan Note (Signed)
Has been going on for 6 months. Some concern for CHF, but this is less likely with a BNP of 17. Echo was performed showing EF 60-65% and grade 1 diastolic dysfunction. On exam, she does have mild nonpitting edema in her ankles bilaterally, but no crackles in her lung bases. Chest x-ray without signs of pulmonary edema or vascular congestion. This could also be cardiac in etiology. Her shortness of breath could be an anginal equivalent. Patient denies any chest pain. EKG showed normal sinus rhythm without any signs of ischemia or prior infarct. I think this is most likely undiagnosed COPD. She has smoked one pack per day on and off for the last 30 years. She quit in September 2016, but still has passive smoke exposure. No signs of pulmonary hypertension on her ECHO. - Advised patient to schedule an appointment with Dr. Valentina Lucks to have PFTs done. - If PFTs are normal, may need to consider referral to pulmonology or cardiology - Could also consider additional imaging like CT chest. - Follow-up with me after PFTs

## 2017-02-25 NOTE — Progress Notes (Signed)
   [facility] Phone: [phone]  Subjective:  Patient presents for follow-up of dyspnea on exertion for last 6 months. She feels like her shortness of breath is getting progressively worse. No shortness of breath with sitting, but has dyspnea on exertion. She is having swelling in her legs for at least a year. No cough, no fevers, no chills. No chest pain. No diagnosed medical conditions. She does not know if she snores. She states she is tired all the time. No morning headaches.  She was seen on 02/07/17 by Dr. Lorenso Courier who ordered a chest x-ray, echo, EKG, BNP, and CBC. Chest x-ray was negative. Echo with EF of 60-65% and grade 1 diastolic dysfunction. EKG showed normal sinus rhythm. BNP was 17. CBC showed mild anemia with a hemoglobin of 10.4. She was seen again on 02/15/2017 by Dr. Andy Gauss. She had a BMP performed at that time that showed a creatinine of 1.39, improved from a previous creatinine of 2.02.  ROS: See HPI for pertinent positives and negatives  Past Medical History- hypertension, type 2 diabetes, hyperlipidemia, obesity, history of tobacco use  Family history reviewed for today's visit. No changes.  Social history- patient is a former smoker. She quit in September 2016. She has smoked on and off for 30 years. When she was smoking, she would smoke a pack per day. She still has passive smoke exposure from her son and grandchildren.  Objective: BP 138/66   Pulse 72   Temp 98.6 F (37 C) (Oral)   Wt 207 lb (93.9 kg)   LMP  (LMP Unknown)   SpO2 96%   BMI 36.67 kg/m  Gen: Tired-appearing, in NAD HEENT: NCAT, EOMI, MMM Neck: FROM, supple, no JVD CV: RRR, no murmur Resp: CTABL, no wheezes, normal work of breathing Msk: Mild non-pitting edema in the feet and ankles bilaterally  Assessment/Plan: Dyspnea on Exertion: Has been going on for 6 months. Some concern for CHF, but this is less likely with a BNP of 17. Echo was performed showing EF 60-65% and  grade 1 diastolic dysfunction. On exam, she does have mild nonpitting edema in her ankles bilaterally, but no crackles in her lung bases. Chest x-ray without signs of pulmonary edema or vascular congestion. This could also be cardiac in etiology. Her shortness of breath could be an anginal equivalent. Patient denies any chest pain. EKG showed normal sinus rhythm without any signs of ischemia or prior infarct. I think this is most likely undiagnosed COPD. She has smoked one pack per day on and off for the last 30 years. She quit in September 2016, but still has passive smoke exposure. No signs of pulmonary hypertension on her ECHO. - Advised patient to schedule an appointment with Dr. Valentina Lucks to have PFTs done. - If PFTs are normal, may need to consider referral to pulmonology or cardiology - Could also consider additional imaging like CT chest. - Follow-up with me after PFTs   Hyman Bible, MD PGY-2

## 2017-02-25 NOTE — Patient Instructions (Signed)
It was so nice to see you!  Please schedule an appointment ASAP with Dr. Valentina Lucks to have pulmonary function testing done. If these are normal, we may need to talk about getting additional imaging of your lungs or sending you to a lung doctor.  -Dr. Brett Albino

## 2017-02-26 ENCOUNTER — Encounter: Payer: Self-pay | Admitting: Pharmacist

## 2017-02-26 ENCOUNTER — Ambulatory Visit (INDEPENDENT_AMBULATORY_CARE_PROVIDER_SITE_OTHER): Payer: Medicare Other | Admitting: Pharmacist

## 2017-02-26 DIAGNOSIS — R0600 Dyspnea, unspecified: Secondary | ICD-10-CM

## 2017-02-26 DIAGNOSIS — R0609 Other forms of dyspnea: Secondary | ICD-10-CM

## 2017-02-26 NOTE — Progress Notes (Signed)
   S:    Patient arrives in good spirits with her grandson. Presents for lung function evaluation.  Patient was referred on 02/25/2017.  Patient was last seen by Primary Care Provider on 02/25/2017. Patient reports having bronchitis in December of 2017 and her breathing has not recovered since then. She gets short of breath when walking with others and frequently has to stop and catch her breath while walking. Patient denies any history of inhaler use.   O: mMRC score= >2 See Documentation Flowsheet - CAT/COPD for complete symptom scoring.  See "scanned report" or Documentation Flowsheet (discrete results - PFTs) for  Spirometry results. Patient provided good effort while attempting spirometry.  A/P: Spirometry evaluation reveals near normal lung function. Patient had bronchitis in 10/2016 and subjectively reports breathing has not returned to normal since then. Educated that breathing should improve with time and with reconditioning exercise. Encouraged patient to begin walking frequently to exercise her lungs. Reviewed results of pulmonary function tests.  Pt verbalized understanding of results and education.  Written pt instructions provided.  F/U Clinic visit if breathing continues to be problematic after exercising more frequently.  Total time in face to face counseling 30 minutes.  Patient seen with Ida Rogue, PharmD Candidate.

## 2017-02-26 NOTE — Assessment & Plan Note (Signed)
Spirometry evaluation reveals near normal lung function. Patient had bronchitis in 10/2016 and subjectively reports breathing has not returned to normal since then. Educated that breathing should improve with time and with reconditioning exercise. Encouraged patient to begin walking frequently to exercise her lungs. Reviewed results of pulmonary function tests.  Pt verbalized understanding of results and education.  Written pt instructions provided.  F/U Clinic visit if breathing continues to be problematic after exercising more frequently.

## 2017-02-26 NOTE — Patient Instructions (Signed)
Lung Function Test was near normal.   Please begin walking more and exercising your lungs as you are able to improve your ability to walk longer distances without getting short of breath.   Please return to the office if you shortness of breath continues to be problematic after you try to exercise more often.   Next Visit in 1-2 months with Dr. Brett Albino.

## 2017-02-27 NOTE — Progress Notes (Signed)
Patient ID: Kimberly Carlson, female   DOB: 15-Nov-1946, 71 y.o.   MRN: [id number] Reviewed: Agree with Dr. Graylin Shiver documentation and management.

## 2017-03-11 ENCOUNTER — Encounter: Payer: Self-pay | Admitting: Internal Medicine

## 2017-03-18 ENCOUNTER — Encounter: Payer: Self-pay | Admitting: Internal Medicine

## 2017-03-21 ENCOUNTER — Other Ambulatory Visit: Payer: Self-pay | Admitting: Internal Medicine

## 2017-03-21 MED ORDER — ENALAPRIL MALEATE 5 MG PO TABS: 5.0000 mg | ORAL_TABLET | Freq: Every day | ORAL | 0 refills | Status: DC

## 2017-03-22 ENCOUNTER — Other Ambulatory Visit: Payer: Self-pay | Admitting: Internal Medicine

## 2017-03-22 DIAGNOSIS — I10 Essential (primary) hypertension: Secondary | ICD-10-CM

## 2017-03-31 ENCOUNTER — Other Ambulatory Visit: Payer: Self-pay | Admitting: Family Medicine

## 2017-03-31 DIAGNOSIS — E7800 Pure hypercholesterolemia, unspecified: Secondary | ICD-10-CM

## 2017-04-23 ENCOUNTER — Other Ambulatory Visit: Payer: Self-pay | Admitting: Internal Medicine

## 2017-04-23 ENCOUNTER — Encounter: Payer: Self-pay | Admitting: Internal Medicine

## 2017-04-23 DIAGNOSIS — I10 Essential (primary) hypertension: Secondary | ICD-10-CM

## 2017-04-24 ENCOUNTER — Other Ambulatory Visit: Payer: Self-pay | Admitting: Internal Medicine

## 2017-04-24 MED ORDER — ENALAPRIL MALEATE 5 MG PO TABS: 5.0000 mg | ORAL_TABLET | Freq: Every day | ORAL | 0 refills | Status: DC

## 2017-04-26 ENCOUNTER — Other Ambulatory Visit: Payer: Self-pay | Admitting: Internal Medicine

## 2017-04-29 ENCOUNTER — Other Ambulatory Visit: Payer: Self-pay | Admitting: Internal Medicine

## 2017-04-29 DIAGNOSIS — I10 Essential (primary) hypertension: Secondary | ICD-10-CM

## 2017-06-18 ENCOUNTER — Telehealth: Payer: Self-pay | Admitting: Internal Medicine

## 2017-06-18 NOTE — Telephone Encounter (Signed)
Unsuccessful contact. Phone rang but unable to leave VM.  Have you received a mammogram in the past 2 years? Did you get a referal, or cancel/no show appt? If yes to any, why?  - Kimberly Carlson

## 2017-07-08 ENCOUNTER — Other Ambulatory Visit: Payer: Self-pay | Admitting: Internal Medicine

## 2017-07-08 ENCOUNTER — Encounter: Payer: Self-pay | Admitting: Internal Medicine

## 2017-07-08 DIAGNOSIS — I10 Essential (primary) hypertension: Secondary | ICD-10-CM

## 2017-07-08 DIAGNOSIS — Z794 Long term (current) use of insulin: Principal | ICD-10-CM

## 2017-07-08 DIAGNOSIS — E1165 Type 2 diabetes mellitus with hyperglycemia: Secondary | ICD-10-CM

## 2017-07-09 ENCOUNTER — Other Ambulatory Visit: Payer: Self-pay | Admitting: Internal Medicine

## 2017-07-09 DIAGNOSIS — E1165 Type 2 diabetes mellitus with hyperglycemia: Secondary | ICD-10-CM

## 2017-07-09 DIAGNOSIS — I10 Essential (primary) hypertension: Secondary | ICD-10-CM

## 2017-07-09 DIAGNOSIS — Z794 Long term (current) use of insulin: Principal | ICD-10-CM

## 2017-07-09 MED ORDER — ENALAPRIL MALEATE 5 MG PO TABS: 5.0000 mg | ORAL_TABLET | Freq: Every day | ORAL | 0 refills | Status: DC

## 2017-07-09 MED ORDER — HYDROCHLOROTHIAZIDE 25 MG PO TABS: 25.0000 mg | ORAL_TABLET | Freq: Every day | ORAL | 0 refills | Status: DC

## 2017-07-09 MED ORDER — INSULIN GLARGINE 100 UNIT/ML ~~LOC~~ SOLN: SUBCUTANEOUS | 0 refills | Status: DC

## 2017-10-04 ENCOUNTER — Other Ambulatory Visit: Payer: Self-pay | Admitting: Internal Medicine

## 2017-10-04 DIAGNOSIS — Z794 Long term (current) use of insulin: Principal | ICD-10-CM

## 2017-10-04 DIAGNOSIS — I10 Essential (primary) hypertension: Secondary | ICD-10-CM

## 2017-10-04 DIAGNOSIS — E1165 Type 2 diabetes mellitus with hyperglycemia: Secondary | ICD-10-CM

## 2017-10-08 ENCOUNTER — Encounter: Payer: Self-pay | Admitting: Internal Medicine

## 2017-10-08 ENCOUNTER — Other Ambulatory Visit: Payer: Self-pay | Admitting: Internal Medicine

## 2017-10-08 DIAGNOSIS — Z794 Long term (current) use of insulin: Principal | ICD-10-CM

## 2017-10-08 DIAGNOSIS — I10 Essential (primary) hypertension: Secondary | ICD-10-CM

## 2017-10-08 DIAGNOSIS — E1165 Type 2 diabetes mellitus with hyperglycemia: Secondary | ICD-10-CM

## 2017-10-08 MED ORDER — HYDROCHLOROTHIAZIDE 25 MG PO TABS: 25.0000 mg | ORAL_TABLET | Freq: Every day | ORAL | 0 refills | Status: DC

## 2017-10-08 MED ORDER — INSULIN GLARGINE 100 UNIT/ML ~~LOC~~ SOLN: SUBCUTANEOUS | 2 refills | Status: DC

## 2017-10-08 MED ORDER — "INSULIN SYRINGE 31G X 5/16"" 1 ML MISC": 1.0000 | 11 refills | Status: DC

## 2017-10-08 MED ORDER — ENALAPRIL MALEATE 5 MG PO TABS: 5.0000 mg | ORAL_TABLET | Freq: Every day | ORAL | 0 refills | Status: DC

## 2017-10-11 MED ORDER — ENALAPRIL MALEATE 5 MG PO TABS: 5.0000 mg | ORAL_TABLET | Freq: Every day | ORAL | 0 refills | Status: DC

## 2017-10-11 MED ORDER — INSULIN GLARGINE 100 UNIT/ML ~~LOC~~ SOLN: SUBCUTANEOUS | 2 refills | Status: DC

## 2017-10-11 MED ORDER — HYDROCHLOROTHIAZIDE 25 MG PO TABS: 25.0000 mg | ORAL_TABLET | Freq: Every day | ORAL | 0 refills | Status: DC

## 2017-10-11 MED ORDER — "INSULIN SYRINGE 31G X 5/16"" 1 ML MISC": 1.0000 | 11 refills | Status: DC

## 2017-10-11 NOTE — Addendum Note (Signed)
Addended by: Valerie Roys on: 10/11/2017 07:47 AM   Modules accepted: Orders

## 2017-10-28 DIAGNOSIS — H25813 Combined forms of age-related cataract, bilateral: Secondary | ICD-10-CM | POA: Diagnosis not present

## 2017-10-28 DIAGNOSIS — E119 Type 2 diabetes mellitus without complications: Secondary | ICD-10-CM | POA: Diagnosis not present

## 2017-11-11 ENCOUNTER — Encounter: Payer: Self-pay | Admitting: Internal Medicine

## 2017-11-11 ENCOUNTER — Ambulatory Visit (INDEPENDENT_AMBULATORY_CARE_PROVIDER_SITE_OTHER): Payer: Medicare Other | Admitting: Internal Medicine

## 2017-11-11 ENCOUNTER — Other Ambulatory Visit: Payer: Self-pay

## 2017-11-11 VITALS — BP 170/80 | HR 99 | Temp 98.2°F | Wt 208.0 lb

## 2017-11-11 DIAGNOSIS — Z1159 Encounter for screening for other viral diseases: Secondary | ICD-10-CM | POA: Diagnosis not present

## 2017-11-11 DIAGNOSIS — E1122 Type 2 diabetes mellitus with diabetic chronic kidney disease: Secondary | ICD-10-CM

## 2017-11-11 DIAGNOSIS — R350 Frequency of micturition: Secondary | ICD-10-CM

## 2017-11-11 DIAGNOSIS — Z1239 Encounter for other screening for malignant neoplasm of breast: Secondary | ICD-10-CM

## 2017-11-11 DIAGNOSIS — Z0000 Encounter for general adult medical examination without abnormal findings: Secondary | ICD-10-CM | POA: Diagnosis not present

## 2017-11-11 DIAGNOSIS — N183 Chronic kidney disease, stage 3 unspecified: Secondary | ICD-10-CM

## 2017-11-11 DIAGNOSIS — Z1211 Encounter for screening for malignant neoplasm of colon: Secondary | ICD-10-CM | POA: Diagnosis not present

## 2017-11-11 DIAGNOSIS — Z794 Long term (current) use of insulin: Secondary | ICD-10-CM

## 2017-11-11 DIAGNOSIS — I10 Essential (primary) hypertension: Secondary | ICD-10-CM

## 2017-11-11 DIAGNOSIS — Z1231 Encounter for screening mammogram for malignant neoplasm of breast: Secondary | ICD-10-CM

## 2017-11-11 LAB — POCT URINALYSIS DIP (MANUAL ENTRY)
Bilirubin, UA: NEGATIVE
Glucose, UA: 100 mg/dL — AB
Ketones, POC UA: NEGATIVE mg/dL
Nitrite, UA: NEGATIVE
Protein Ur, POC: >=300 mg/dL — AB
Spec Grav, UA: 1.025 (ref 1.010–1.025)
Urobilinogen, UA: 0.2 E.U./dL
pH, UA: 6.5 (ref 5.0–8.0)

## 2017-11-11 LAB — POCT UA - MICROSCOPIC ONLY: WBC, Ur, HPF, POC: >20

## 2017-11-11 LAB — POCT GLYCOSYLATED HEMOGLOBIN (HGB A1C): Hemoglobin A1C: 8.2

## 2017-11-11 NOTE — Patient Instructions (Addendum)
It was so nice to see you!  For your diabetes- please increase your Lantus by 1 unit every day that your blood sugar is greater than 130. So if your blood sugar is greater than 130 tomorrow, take 29 units of Lantus. Once your blood sugar is below 130, keep taking that amount of Lantus.  For your blood pressure, I have increased your Hydralazine to 25mg  three times per day.   I have sent your urine for culture and I will call you with these results.  Please bring your stool cards back to our clinic to test for colon cancer.  We will see you back in clinic in 1 month!  -Dr. Brett Albino

## 2017-11-12 LAB — BASIC METABOLIC PANEL
BUN/Creatinine Ratio: 23 (ref 12–28)
BUN: 38 mg/dL — ABNORMAL HIGH (ref 8–27)
CO2: 25 mmol/L (ref 20–29)
Calcium: 9.2 mg/dL (ref 8.7–10.3)
Chloride: 106 mmol/L (ref 96–106)
Creatinine, Ser: 1.68 mg/dL — ABNORMAL HIGH (ref 0.57–1.00)
GFR calc Af Amer: 34 mL/min/{1.73_m2} — ABNORMAL LOW (ref >59)
GFR calc non Af Amer: 30 mL/min/{1.73_m2} — ABNORMAL LOW (ref >59)
Glucose: 136 mg/dL — ABNORMAL HIGH (ref 65–99)
Potassium: 4 mmol/L (ref 3.5–5.2)
Sodium: 143 mmol/L (ref 134–144)

## 2017-11-12 LAB — HEPATITIS C ANTIBODY: Hep C Virus Ab: <0.1 s/co ratio (ref 0.0–0.9)

## 2017-11-13 ENCOUNTER — Telehealth: Payer: Self-pay | Admitting: Internal Medicine

## 2017-11-13 DIAGNOSIS — R350 Frequency of micturition: Secondary | ICD-10-CM | POA: Insufficient documentation

## 2017-11-13 DIAGNOSIS — Z0000 Encounter for general adult medical examination without abnormal findings: Secondary | ICD-10-CM | POA: Insufficient documentation

## 2017-11-13 LAB — URINE CULTURE

## 2017-11-13 MED ORDER — HYDRALAZINE HCL 25 MG PO TABS: 25.0000 mg | ORAL_TABLET | Freq: Three times a day (TID) | ORAL | 2 refills | Status: DC

## 2017-11-13 NOTE — Assessment & Plan Note (Signed)
Uncontrolled. A1c 8.2%. Goal <8 for her age.  - Increase Lantus by 1 unit every day that blood sugar is >130. Stop increasing the Lantus when blood sugar is <130. - Continue checking blood sugar once daily - Diabetic foot exam performed today and was normal - Patient recently had diabetic eye exam- will obtain records - Follow-up in 1 month with blood sugar log

## 2017-11-13 NOTE — Assessment & Plan Note (Signed)
-   Mammogram ordered and patient given [facility] - Discussed colonoscopy in detail. Patient declined. Agreeable to cologuard. Cologuard ordered. - Hep C screening performed today, as we are already getting other labs.

## 2017-11-13 NOTE — Telephone Encounter (Signed)
Called patient to discuss her lab results. Left voicemail. Her electrolyte panel looked good, except for some chronic kidney disease that is unchanged since 2016. Her urine culture did not grow any bacteria, so she does not have a UTI.  Hyman Bible, MD PGY-3

## 2017-11-13 NOTE — Assessment & Plan Note (Signed)
UA performed in clinic today with small leukocytes and negative nitrites. Given that she is not having any other symptoms of a UTI and her urinary frequency may be due to worsening blood sugar control, will hold off on empirically treating for a UTI at this time. - Urine culture ordered

## 2017-11-13 NOTE — Assessment & Plan Note (Signed)
Uncontrolled. BP 170/80 in clinic today. Taking Enalapril 5mg  daily and HCTZ 25mg  daily. Supposed to be taking Hydralazine, but patient forgot she was supposed to be taking this. - Restart Hydralazine 25mg  tid - Follow-up in 2 weeks in nursing clinic for blood pressure check - Check BMET today - Follow-up with me in 1 month

## 2017-11-13 NOTE — Progress Notes (Signed)
   [facility] Phone: [phone]  Subjective:  Kimberly Carlson is a 71 year old female presenting to clinic for urinary frequency and for follow-up of her T2DM and HTN.  Urinary Frequency: Has been going on for the last couple of months. Denies dysuria, hematuria, urgency, fevers, suprapubic pain, or flank pain. Denies urinary incontinence. Endorses polydipsia.  T2DM: Checking CBGs once daily at home. Have been ranging from 140-170. Taking Lantus 28 units every morning. Endorses polyuria and polydipsia.  HTN: Not checking BPs at home. Taking Enalapril and HCTZ at home. Has taken Hydralazine in the past and it is currently listed on her medication list, but she states she forgot she was supposed to be taking this. She is unsure how long it has been since she took this medication.  ROS: See HPI for pertinent positives and negatives  Past Medical History- HTN, T2DM, HLD, obesity  Family history reviewed for today's visit. No changes.  Social history- patient is a former smoker, quit in 2016. Also with hx of cocaine use.  Objective: BP (!) 170/80   Pulse 99   Temp 98.2 F (36.8 C) (Oral)   Wt 208 lb (94.3 kg)   LMP  (LMP Unknown)   SpO2 97%   BMI 37.44 kg/m  Gen: NAD, alert, cooperative with exam HEENT: NCAT, EOMI, MMM Neck: FROM, supple CV: RRR, no murmur Resp: CTABL, no wheezes, normal work of breathing GI: SNTND, BS present, no guarding or organomegaly Back: No CVA tenderness Msk: No edema, warm, normal tone, moves UE/LE spontaneously Diabetic Foot: No deformities, no ulcerations, no other skin breakdown bilaterally; intact to touch and monofilament testing bilaterally; PT and DP pulse intact bilaterally Neuro: Alert and oriented, no gross deficits Skin: No rashes, no lesions Psych: Appropriate behavior  Assessment/Plan: Urinary Frequency: UA performed in clinic today with small leukocytes and negative nitrites. Given that she is not having any other  symptoms of a UTI and her urinary frequency may be due to worsening blood sugar control, will hold off on empirically treating for a UTI at this time. - Urine culture ordered  T2DM: Uncontrolled. A1c 8.2%. Goal <8 for her age.  - Increase Lantus by 1 unit every day that blood sugar is >130. Stop increasing the Lantus when blood sugar is <130. - Continue checking blood sugar once daily - Diabetic foot exam performed today and was normal - Patient recently had diabetic eye exam- will obtain records - Follow-up in 1 month with blood sugar log  HTN: Uncontrolled. BP 170/80 in clinic today. Taking Enalapril 5mg  daily and HCTZ 25mg  daily. Supposed to be taking Hydralazine, but patient forgot she was supposed to be taking this. - Restart Hydralazine 25mg  tid - Follow-up in 2 weeks in nursing clinic for blood pressure check - Check BMET today - Follow-up with me in 1 month  Health Care Maintenance: - Mammogram ordered and patient given number [facility] - Discussed colonoscopy in detail. Patient declined. Agreeable to cologuard. Cologuard ordered. - Hep C screening performed today, as we are already getting other labs.    Hyman Bible, MD PGY-3

## 2017-11-14 ENCOUNTER — Telehealth: Payer: Self-pay | Admitting: Licensed Clinical Social Worker

## 2017-11-14 NOTE — Progress Notes (Signed)
Type of Service: Clinical Social Work Consult from Dr. Brett Albino reference transportation barriers to medical appointments  LCSW called patient to assess for the above consult. Patient states family and friends usually take her to appointment, son's car is no longer working.  Discussed various community resources.  Provided patient with phone number to [employer] for medical transportation.  Patient will also complete SCAT application. LCSW will notify PCP that part B of SCAT application is in her mailbox.  PCP to complete and give to patient at next appoint Jan 18th. Patient appreciative of information and assistance.     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    Casimer Lanius, LCSW Licensed Clinical Social Worker [facility]   [phone] 2:41 PM

## 2017-12-09 DIAGNOSIS — Z1211 Encounter for screening for malignant neoplasm of colon: Secondary | ICD-10-CM | POA: Diagnosis not present

## 2017-12-09 DIAGNOSIS — Z1212 Encounter for screening for malignant neoplasm of rectum: Secondary | ICD-10-CM | POA: Diagnosis not present

## 2017-12-09 LAB — COLOGUARD: Cologuard: POSITIVE

## 2017-12-13 ENCOUNTER — Encounter: Payer: Self-pay | Admitting: Internal Medicine

## 2017-12-13 ENCOUNTER — Other Ambulatory Visit: Payer: Self-pay

## 2017-12-13 ENCOUNTER — Ambulatory Visit (INDEPENDENT_AMBULATORY_CARE_PROVIDER_SITE_OTHER): Payer: Medicare Other | Admitting: Internal Medicine

## 2017-12-13 ENCOUNTER — Other Ambulatory Visit: Payer: Self-pay | Admitting: Family Medicine

## 2017-12-13 VITALS — BP 183/75 | HR 93 | Temp 98.1°F | Wt 210.0 lb

## 2017-12-13 DIAGNOSIS — I10 Essential (primary) hypertension: Secondary | ICD-10-CM | POA: Diagnosis not present

## 2017-12-13 DIAGNOSIS — G4485 Primary stabbing headache: Secondary | ICD-10-CM

## 2017-12-13 NOTE — Progress Notes (Signed)
   [facility] Phone: [phone]  Subjective:  Kimberly Carlson is a 72 year old female presenting to clinic for headaches.  Headache: Has been going on for 1 month. The pain is located "all over her head" but is worse on the left side. The pain is "throbbing". She has been taking 4 tablets of Tylenol and 4 tablets of Advil daily, which has been helping. The pain is constant, but improved with the pain meds. No changes in vision. She endorses photophobia and nausea. No phonophobia. No numbness, tingling, or weakness in her extremities. She endorses balance issues for the last 2 months. Does not feel like the room is spinning or like she is going to pass out when she stands up too quick. Just feels "off balance".   HTN: Does not check blood pressures at home. Taking Enalapril 5mg  daily and HCTZ 25mg  daily. Has been prescribed Hydralazine 25mg  tid, but is not taking this because she doesn't want to take a medication three times per day. No chest pain, no shortness of breath, no lower extremity edema. No side effects to any medications.  ROS: See HPI for pertinent positives and negatives  Past Medical History- HTN, T2DM, HLD  Family history reviewed for today's visit. No changes.  Social history- hx of tobacco use and cocaine use  Objective: BP (!) 183/75   Pulse 93   Temp 98.1 F (36.7 C) (Oral)   Wt 210 lb (95.3 kg)   LMP  (LMP Unknown)   SpO2 96%   BMI 37.80 kg/m  Gen: NAD, alert, cooperative with exam HEENT: NCAT, EOMI, PERRLA, MMM, no tenderness over temporal arteries bilaterally Neck: FROM, supple CV: RRR, no murmur Resp: CTABL, no wheezes, normal work of breathing Msk: No edema, warm, normal tone, moves UE/LE spontaneously Neuro: Alert and oriented, CN 2-12 intact, 5/5 muscle strength in upper and lower extremities bilaterally, sensation intact to light touch throughout, normal finger-to-nose test, normal heel-to-shin test, reflexes normal and symmetric,  negative Romberg, normal gait Skin: No rashes, no lesions Psych: Appropriate behavior  Assessment/Plan: Headaches: Patient having generalized but sometimes L > R headache constantly for the last month. Could be migraine due to patient's associated photophobia and nausea, but it is unusual that someone would have a migraine for a full month. No tenderness over temporal arteries to suggest temporal arteritis. Could also be due to patient's uncontrolled HTN (unclear if the BP is causing the headache or if the headache is causing the elevated BP). See separate problem. Concern for intracranial abnormality, given patient's age and associated "feeling of being off balance"; however, neuro exam is completely unremarkable today. - Will obtain CT head to rule out intracranial abnormality. - Continue Tylenol as needed for headache. Advised patient to stop taking Ibuprofen due to her worsening renal function - Will call patient to discuss results. - Precepted with attending.  HTN: Uncontrolled. BP 183/75 today. Taking Enalapril 5mg  daily and HCTZ 25mg  daily. Supposed to be taking Hydralazine 25mg  tid, but is not taking this. Allergy to Norvasc. BP may be elevated due to headache today. - Increase Enalapril from 5mg  daily to 10mg  daily - Continue HCTZ 25mg  daily - Check BMP today and then again in 1 week - Follow-up for nursing visit in 1 week for BP check.    Hyman Bible, MD PGY-3

## 2017-12-13 NOTE — Patient Instructions (Signed)
It was so nice to see you!  I have ordered a cat scan of your head so that we can make sure there is nothing more serious going on. You should continue to take Tylenol for the headache. Please do NOT take Advil or Ibuprofen.  For your blood pressure, please increase your Enalapril to 10mg  (2 tablets) daily. We will see you back in clinic in 2 weeks for BP check and repeat lab work.  -Dr. Brett Albino

## 2017-12-14 LAB — BASIC METABOLIC PANEL
BUN/Creatinine Ratio: 20 (ref 12–28)
BUN: 35 mg/dL — ABNORMAL HIGH (ref 8–27)
CO2: 26 mmol/L (ref 20–29)
Calcium: 8.9 mg/dL (ref 8.7–10.3)
Chloride: 102 mmol/L (ref 96–106)
Creatinine, Ser: 1.74 mg/dL — ABNORMAL HIGH (ref 0.57–1.00)
GFR calc Af Amer: 33 mL/min/{1.73_m2} — ABNORMAL LOW (ref >59)
GFR calc non Af Amer: 28 mL/min/{1.73_m2} — ABNORMAL LOW (ref >59)
Glucose: 260 mg/dL — ABNORMAL HIGH (ref 65–99)
Potassium: 4.4 mmol/L (ref 3.5–5.2)
Sodium: 143 mmol/L (ref 134–144)

## 2017-12-17 NOTE — Assessment & Plan Note (Signed)
Uncontrolled. BP 183/75 today. Taking Enalapril 5mg  daily and HCTZ 25mg  daily. Supposed to be taking Hydralazine 25mg  tid, but is not taking this. Allergy to Norvasc. BP may be elevated due to headache today. - Increase Enalapril from 5mg  daily to 10mg  daily - Continue HCTZ 25mg  daily - Check BMP today and then again in 1 week - Follow-up for nursing visit in 1 week for BP check.

## 2017-12-17 NOTE — Assessment & Plan Note (Signed)
Patient having generalized but sometimes L > R headache constantly for the last month. Could be migraine due to patient's associated photophobia and nausea, but it is unusual that someone would have a migraine for a full month. No tenderness over temporal arteries to suggest temporal arteritis. Could also be due to patient's uncontrolled HTN (unclear if the BP is causing the headache or if the headache is causing the elevated BP). See separate problem. Concern for intracranial abnormality, given patient's age and associated "feeling of being off balance"; however, neuro exam is completely unremarkable today. - Will obtain CT head to rule out intracranial abnormality. - Continue Tylenol as needed for headache. Advised patient to stop taking Ibuprofen due to her worsening renal function - Will call patient to discuss results. - Precepted with attending.

## 2017-12-19 ENCOUNTER — Telehealth: Payer: Self-pay | Admitting: Internal Medicine

## 2017-12-19 ENCOUNTER — Ambulatory Visit (HOSPITAL_COMMUNITY)
Admission: RE | Admit: 2017-12-19 | Discharge: 2017-12-19 | Disposition: A | Discharge status: Home / Self Care | Payer: Medicare Other | Source: Ambulatory Visit | Attending: Family Medicine | Admitting: Family Medicine

## 2017-12-19 DIAGNOSIS — G4485 Primary stabbing headache: Secondary | ICD-10-CM | POA: Diagnosis not present

## 2017-12-19 DIAGNOSIS — R195 Other fecal abnormalities: Secondary | ICD-10-CM

## 2017-12-19 DIAGNOSIS — R51 Headache: Secondary | ICD-10-CM | POA: Diagnosis not present

## 2017-12-19 NOTE — Telephone Encounter (Signed)
Called patient to discuss the results of her cologuard. Her stool was positive for blood so she needs a colonoscopy to evaluate further. I left a voicemail asking her to call us back.  Hyman Bible, MD PGY-3

## 2017-12-22 ENCOUNTER — Other Ambulatory Visit: Payer: Self-pay

## 2017-12-22 ENCOUNTER — Encounter (HOSPITAL_COMMUNITY): Payer: Self-pay | Admitting: Emergency Medicine

## 2017-12-22 ENCOUNTER — Emergency Department (HOSPITAL_COMMUNITY): Payer: Medicare Other

## 2017-12-22 ENCOUNTER — Inpatient Hospital Stay (HOSPITAL_COMMUNITY)
Admission: EM | Admit: 2017-12-22 | Discharge: 2017-12-24 | DRG: 305 | Disposition: A | Discharge status: Home / Self Care | Payer: Medicare Other | Attending: Family Medicine | Admitting: Family Medicine

## 2017-12-22 DIAGNOSIS — I129 Hypertensive chronic kidney disease with stage 1 through stage 4 chronic kidney disease, or unspecified chronic kidney disease: Secondary | ICD-10-CM | POA: Diagnosis not present

## 2017-12-22 DIAGNOSIS — R519 Headache, unspecified: Secondary | ICD-10-CM

## 2017-12-22 DIAGNOSIS — E785 Hyperlipidemia, unspecified: Secondary | ICD-10-CM | POA: Diagnosis present

## 2017-12-22 DIAGNOSIS — G4489 Other headache syndrome: Secondary | ICD-10-CM | POA: Diagnosis not present

## 2017-12-22 DIAGNOSIS — Z7982 Long term (current) use of aspirin: Secondary | ICD-10-CM

## 2017-12-22 DIAGNOSIS — R5383 Other fatigue: Secondary | ICD-10-CM | POA: Diagnosis not present

## 2017-12-22 DIAGNOSIS — N183 Chronic kidney disease, stage 3 unspecified: Secondary | ICD-10-CM

## 2017-12-22 DIAGNOSIS — E559 Vitamin D deficiency, unspecified: Secondary | ICD-10-CM | POA: Diagnosis present

## 2017-12-22 DIAGNOSIS — Z794 Long term (current) use of insulin: Secondary | ICD-10-CM

## 2017-12-22 DIAGNOSIS — I160 Hypertensive urgency: Principal | ICD-10-CM | POA: Diagnosis present

## 2017-12-22 DIAGNOSIS — E1122 Type 2 diabetes mellitus with diabetic chronic kidney disease: Secondary | ICD-10-CM | POA: Diagnosis present

## 2017-12-22 DIAGNOSIS — N184 Chronic kidney disease, stage 4 (severe): Secondary | ICD-10-CM | POA: Diagnosis not present

## 2017-12-22 DIAGNOSIS — R51 Headache: Secondary | ICD-10-CM

## 2017-12-22 DIAGNOSIS — R110 Nausea: Secondary | ICD-10-CM | POA: Diagnosis not present

## 2017-12-22 DIAGNOSIS — Z87891 Personal history of nicotine dependence: Secondary | ICD-10-CM

## 2017-12-22 DIAGNOSIS — R030 Elevated blood-pressure reading, without diagnosis of hypertension: Secondary | ICD-10-CM | POA: Diagnosis not present

## 2017-12-22 DIAGNOSIS — R42 Dizziness and giddiness: Secondary | ICD-10-CM | POA: Diagnosis not present

## 2017-12-22 LAB — URINALYSIS, ROUTINE W REFLEX MICROSCOPIC
Bilirubin Urine: NEGATIVE
Glucose, UA: 50 mg/dL — AB
Hgb urine dipstick: NEGATIVE
Ketones, ur: NEGATIVE mg/dL
Leukocytes, UA: NEGATIVE
Nitrite: NEGATIVE
Protein, ur: >=300 mg/dL — AB
Specific Gravity, Urine: 1.015 (ref 1.005–1.030)
Squamous Epithelial / LPF: NONE SEEN
pH: 8 (ref 5.0–8.0)

## 2017-12-22 LAB — COMPREHENSIVE METABOLIC PANEL
ALT: 26 U/L (ref 14–54)
AST: 30 U/L (ref 15–41)
Albumin: 3.1 g/dL — ABNORMAL LOW (ref 3.5–5.0)
Alkaline Phosphatase: 99 U/L (ref 38–126)
Anion gap: 12 (ref 5–15)
BUN: 30 mg/dL — ABNORMAL HIGH (ref 6–20)
CO2: 26 mmol/L (ref 22–32)
Calcium: 9.2 mg/dL (ref 8.9–10.3)
Chloride: 101 mmol/L (ref 101–111)
Creatinine, Ser: 1.66 mg/dL — ABNORMAL HIGH (ref 0.44–1.00)
GFR calc Af Amer: 34 mL/min — ABNORMAL LOW (ref >60)
GFR calc non Af Amer: 29 mL/min — ABNORMAL LOW (ref >60)
Glucose, Bld: 158 mg/dL — ABNORMAL HIGH (ref 65–99)
Potassium: 3.4 mmol/L — ABNORMAL LOW (ref 3.5–5.1)
Sodium: 139 mmol/L (ref 135–145)
Total Bilirubin: 0.6 mg/dL (ref 0.3–1.2)
Total Protein: 6.3 g/dL — ABNORMAL LOW (ref 6.5–8.1)

## 2017-12-22 LAB — CBC WITH DIFFERENTIAL/PLATELET
Basophils Absolute: 0 10*3/uL (ref 0.0–0.1)
Basophils Relative: 0 %
Eosinophils Absolute: 0.1 10*3/uL (ref 0.0–0.7)
Eosinophils Relative: 1 %
HCT: 31.2 % — ABNORMAL LOW (ref 36.0–46.0)
Hemoglobin: 10.2 g/dL — ABNORMAL LOW (ref 12.0–15.0)
Lymphocytes Relative: 10 %
Lymphs Abs: 1.3 10*3/uL (ref 0.7–4.0)
MCH: 29.7 pg (ref 26.0–34.0)
MCHC: 32.7 g/dL (ref 30.0–36.0)
MCV: 90.7 fL (ref 78.0–100.0)
Monocytes Absolute: 0.5 10*3/uL (ref 0.1–1.0)
Monocytes Relative: 4 %
Neutro Abs: 11.1 10*3/uL — ABNORMAL HIGH (ref 1.7–7.7)
Neutrophils Relative %: 85 %
Platelets: 196 10*3/uL (ref 150–400)
RBC: 3.44 MIL/uL — ABNORMAL LOW (ref 3.87–5.11)
RDW: 12.4 % (ref 11.5–15.5)
WBC: 13 10*3/uL — ABNORMAL HIGH (ref 4.0–10.5)

## 2017-12-22 LAB — I-STAT TROPONIN, ED: Troponin i, poc: 0.01 ng/mL (ref 0.00–0.08)

## 2017-12-22 LAB — RAPID URINE DRUG SCREEN, HOSP PERFORMED
Amphetamines: NOT DETECTED
Barbiturates: NOT DETECTED
Benzodiazepines: NOT DETECTED
Cocaine: NOT DETECTED
Opiates: NOT DETECTED
Tetrahydrocannabinol: NOT DETECTED

## 2017-12-22 LAB — CBG MONITORING, ED: Glucose-Capillary: 122 mg/dL — ABNORMAL HIGH (ref 65–99)

## 2017-12-22 LAB — BRAIN NATRIURETIC PEPTIDE: B Natriuretic Peptide: 20.7 pg/mL (ref 0.0–100.0)

## 2017-12-22 LAB — LIPASE, BLOOD: Lipase: 24 U/L (ref 11–51)

## 2017-12-22 LAB — PROTIME-INR
INR: 0.96
Prothrombin Time: 12.7 seconds (ref 11.4–15.2)

## 2017-12-22 MED ORDER — LABETALOL HCL 5 MG/ML IV SOLN
10.0000 mg | Freq: Once | INTRAVENOUS | Status: AC
  Administered 2017-12-23: 10 mg via INTRAVENOUS
  Filled 2017-12-22: qty 4

## 2017-12-22 MED ORDER — ACETAMINOPHEN 325 MG PO TABS
650.0000 mg | ORAL_TABLET | Freq: Once | ORAL | Status: AC
  Administered 2017-12-22: 650 mg via ORAL
  Filled 2017-12-22: qty 2

## 2017-12-22 MED ORDER — LORAZEPAM 2 MG/ML IJ SOLN
1.0000 mg | Freq: Once | INTRAMUSCULAR | Status: AC
  Administered 2017-12-22: 1 mg via INTRAVENOUS
  Filled 2017-12-22: qty 1

## 2017-12-22 MED ORDER — LABETALOL HCL 5 MG/ML IV SOLN
10.0000 mg | Freq: Once | INTRAVENOUS | Status: AC
  Administered 2017-12-22: 10 mg via INTRAVENOUS
  Filled 2017-12-22: qty 4

## 2017-12-22 NOTE — ED Notes (Signed)
Pt family asked if we could check pt sugar because pt was feeling sick. CBG 122.

## 2017-12-22 NOTE — ED Triage Notes (Signed)
Pt to ED via [facility]  With c/o headache and HTN.  Pt st's she has had headaches off and on for a few days.  Today pt had sudden onset of headache with dizziness. Pt denies nausea, vomiting or chest pain

## 2017-12-22 NOTE — ED Notes (Signed)
Pt alert and oriented x's 3.  Skin warm and dry, color appropriate.  Neuro exam neg at this time.  Pt st's she does feel like bil lower legs are tingling

## 2017-12-22 NOTE — ED Provider Notes (Signed)
[facility] EMERGENCY DEPARTMENT Provider Note   CSN: [id number] Arrival date & time: 12/22/17  1726     History   Chief Complaint Chief Complaint  Patient presents with  . Hypertension    HPI Kimberly Carlson is a 72 y.o. female.  The history is provided by the patient, medical records and a relative. No language interpreter was used.  Headache   This is a recurrent problem. The current episode started 3 to 5 hours ago. The problem occurs constantly. The problem has not changed since onset.The headache is associated with nothing. The pain is located in the frontal region. The quality of the pain is described as dull. The pain is at a severity of 9/10. The pain is severe. The pain does not radiate. Associated symptoms include nausea. Pertinent negatives include no fever, no chest pressure, no near-syncope, no palpitations, no syncope, no shortness of breath and no vomiting. The treatment provided no relief.    Past Medical History:  Diagnosis Date  . Back pain    herniated disc lower back  . Diabetes mellitus without complication ([place])   . Hypertension     Patient Active Problem List   Diagnosis Date Noted  . Healthcare maintenance 11/13/2017  . Hypovitaminosis D 02/28/2016  . Cataract 02/27/2016  . Anemia 08/10/2015  . Hematuria, undiagnosed cause 06/09/2015  . Headache 06/08/2015  . Cocaine use 06/08/2015  . DIPLOPIA 11/28/2010  . DEGENERATIVE DISC DISEASE 11/09/2009  . History of tobacco use 09/03/2009  . Obesity 07/08/2009  . HERNIATED LUMBAR DISC 05/14/2007  . DM2 (diabetes mellitus, type 2) ([place]) 01/23/2007  . HYPERCHOLESTEROLEMIA 01/23/2007  . Essential hypertension 01/23/2007    Past Surgical History:  Procedure Laterality Date  . ABDOMINAL HYSTERECTOMY      OB History    No data available       Home Medications    Prior to Admission medications   Medication Sig Start Date End Date Taking? Authorizing Provider  aspirin  (ASPIRIN CHILDRENS) 81 MG chewable tablet Chew 81 mg by mouth daily.      [provider]  atorvastatin (LIPITOR) 40 MG tablet Take 1 tablet (40 mg total) by mouth daily at 6 PM. 02/13/17   Mayo, Pete Pelt, MD  calcium-vitamin D (OSCAL 500/200 D-3) 500-200 MG-UNIT tablet Take 2 tablets by mouth daily with breakfast. Patient taking differently: Take 1 tablet by mouth daily with breakfast.  02/27/16   Olam Idler, MD  enalapril (VASOTEC) 5 MG tablet Take 1 tablet (5 mg total) daily by mouth. 10/11/17   Mayo, Pete Pelt, MD  glucose blood (ONE TOUCH ULTRA TEST) test strip Check sugar 6 x daily 10/02/16   Mayo, Pete Pelt, MD  hydrALAZINE (APRESOLINE) 25 MG tablet Take 1 tablet (25 mg total) by mouth 3 (three) times daily. 11/13/17   Mayo, Pete Pelt, MD  hydrochlorothiazide (HYDRODIURIL) 25 MG tablet Take 1 tablet (25 mg total) daily by mouth. 10/11/17   Mayo, Pete Pelt, MD  insulin glargine (LANTUS) 100 UNIT/ML injection INJECT SUBCUTANEOUSLY 28  UNITS ONCE DAILY 10/11/17   Mayo, Pete Pelt, MD  Insulin Syringe-Needle U-100 (INSULIN SYRINGE 1CC/31GX5/16") 31G X 5/16" 1 ML MISC 1 each See admin instructions by Does not apply route. Use daily with insulin. 10/11/17   Mayo, Pete Pelt, MD  ONE TOUCH LANCETS MISC 1 each by Does not apply route See admin instructions. Check blood sugar once daily. 02/21/15   Olam Idler, MD    Family  History Family History  Adopted: Yes    Social History Social History   Tobacco Use  . Smoking status: Former Smoker    Packs/day: 1.00    Types: Cigarettes    Start date: 04/26/2014    Last attempt to quit: 07/23/2015    Years since quitting: 2.4  . Smokeless tobacco: Never Used  . Tobacco comment: Started in her 40's with multiple quit attempts for about 1 year at a time.  Substance Use Topics  . Alcohol use: No    Alcohol/week: 0.0 oz  . Drug use: No     Allergies   Amlodipine; Augmentin [amoxicillin-pot clavulanate]; Clonidine derivatives;  Doxycycline; and Metformin   Review of Systems Review of Systems  Constitutional: Negative for chills, diaphoresis, fatigue and fever.  HENT: Negative for congestion.   Eyes: Negative for visual disturbance.  Respiratory: Negative for cough, chest tightness, shortness of breath, wheezing and stridor.   Cardiovascular: Negative for chest pain, palpitations, syncope and near-syncope.  Gastrointestinal: Positive for nausea. Negative for vomiting.  Genitourinary: Negative for dysuria, flank pain, frequency and hematuria.  Musculoskeletal: Negative for back pain, neck pain and neck stiffness.  Skin: Negative for rash and wound.  Neurological: Positive for light-headedness and headaches. Negative for weakness and numbness.  Psychiatric/Behavioral: Negative for agitation and confusion.  All other systems reviewed and are negative.    Physical Exam Updated Vital Signs BP (!) 206/81 (BP Location: Right Arm)   Pulse 92   Temp 97.9 F (36.6 C) (Oral)   Resp 18   Ht 5\' 3"  (1.6 m)   Wt 95.3 kg (210 lb)   LMP  (LMP Unknown)   SpO2 98%   BMI 37.20 kg/m   Physical Exam  Constitutional: She is oriented to person, place, and time. She appears well-developed and well-nourished. No distress.  HENT:  Head: Normocephalic and atraumatic.  Nose: Nose normal.  Mouth/Throat: Oropharynx is clear and moist. No oropharyngeal exudate.  Eyes: Conjunctivae and EOM are normal. Pupils are equal, round, and reactive to light.  Neck: Normal range of motion.  Cardiovascular: Normal rate and intact distal pulses.  No murmur heard. Pulmonary/Chest: No stridor. No respiratory distress. She has no wheezes. She has no rales. She exhibits no tenderness.  Abdominal: Soft. She exhibits no distension. There is no tenderness.  Musculoskeletal: She exhibits no edema.  Neurological: She is alert and oriented to person, place, and time. No cranial nerve deficit or sensory deficit. She exhibits normal muscle tone.  Coordination normal.  Skin: Capillary refill takes less than 2 seconds. No rash noted. She is not diaphoretic. No erythema.  Psychiatric: She has a normal mood and affect.  Nursing note and vitals reviewed.    ED Treatments / Results  Labs (all labs ordered are listed, but only abnormal results are displayed) Labs Reviewed  CBC WITH DIFFERENTIAL/PLATELET - Abnormal; Notable for the following components:      Result Value   WBC 13.0 (*)    RBC 3.44 (*)    Hemoglobin 10.2 (*)    HCT 31.2 (*)    Neutro Abs 11.1 (*)    All other components within normal limits  COMPREHENSIVE METABOLIC PANEL - Abnormal; Notable for the following components:   Potassium 3.4 (*)    Glucose, Bld 158 (*)    BUN 30 (*)    Creatinine, Ser 1.66 (*)    Total Protein 6.3 (*)    Albumin 3.1 (*)    GFR calc non Af Amer 29 (*)  GFR calc Af Amer 34 (*)    All other components within normal limits  URINALYSIS, ROUTINE W REFLEX MICROSCOPIC - Abnormal; Notable for the following components:   Glucose, UA 50 (*)    Protein, ur >=300 (*)    Bacteria, UA RARE (*)    All other components within normal limits  CBG MONITORING, ED - Abnormal; Notable for the following components:   Glucose-Capillary 122 (*)    All other components within normal limits  LIPASE, BLOOD  BRAIN NATRIURETIC PEPTIDE  PROTIME-INR  RAPID URINE DRUG SCREEN, HOSP PERFORMED  I-STAT TROPONIN, ED    EKG  EKG Interpretation  Date/Time:  Sunday December 22 2017 20:57:07 EST Ventricular Rate:  82 PR Interval:    QRS Duration: 83 QT Interval:  373 QTC Calculation: 436 R Axis:   37 Text Interpretation:  Sinus rhythm Posterior infarct, old when compared to prior, no significant changes seen.  No STEMI Confirmed by Antony Blackbird [phone]) on 12/22/2017 10:09:25 PM       Radiology Dg Chest 2 View  Result Date: 12/22/2017 CLINICAL DATA:  Hypertension, fatigue, leg edema, diabetes mellitus EXAM: CHEST  2 VIEW COMPARISON:  02/18/2017  FINDINGS: Upper normal heart size. Mediastinal contours and pulmonary vascularity normal. Lungs clear. No pleural effusion or pneumothorax. Scattered endplate spur formation thoracic spine. IMPRESSION: No acute abnormalities. Electronically Signed   By: Lavonia Dana M.D.   On: 12/22/2017 18:28   Ct Head Wo Contrast  Result Date: 12/22/2017 CLINICAL DATA:  Headache.  Hypertension. EXAM: CT HEAD WITHOUT CONTRAST TECHNIQUE: Contiguous axial images were obtained from the base of the skull through the vertex without intravenous contrast. COMPARISON:  Brain CT 12/19/2017 FINDINGS: Brain: Ventricles and sulci are appropriate for patient's age. No evidence for acute cortically based infarct, intracranial hemorrhage, mass lesion or mass-effect. Vascular: Internal carotid arterial vascular calcifications. Skull: Intact. Sinuses/Orbits: Paranasal sinuses are unremarkable. Mastoid air cells are unremarkable. Orbits are unremarkable. Other: None. IMPRESSION: No acute intracranial process. Electronically Signed   By: Lovey Newcomer M.D.   On: 12/22/2017 21:01    Procedures Procedures (including critical care time)  Medications Ordered in ED Medications  labetalol (NORMODYNE,TRANDATE) injection 10 mg (not administered)  labetalol (NORMODYNE,TRANDATE) injection 10 mg (10 mg Intravenous Given 12/22/17 1915)  LORazepam (ATIVAN) injection 1 mg (1 mg Intravenous Given 12/22/17 2014)  acetaminophen (TYLENOL) tablet 650 mg (650 mg Oral Given 12/22/17 2041)     Initial Impression / Assessment and Plan / ED Course  I have reviewed the triage vital signs and the nursing notes.  Pertinent labs & imaging results that were available during my care of the patient were reviewed by me and considered in my medical decision making (see chart for details).     KRYSTIANA FORNES is a 72 y.o. female with a past medical history significant for hypertension, diabetes, and substance abuse who presents for headache, confusion,  lightheadedness, and severe hypertension.  According to patient, she was seen several days ago for elevated blood pressure and headache.  At that time, patient had a CT of the head which was reassuring and patient was sent home with increasing blood pressure medications.   Patient says that today, she had sudden onset of headache around 4:30 PM that was 8 out of 10 severity.  She reported feeling nauseous but no vomiting.  She denies vision changes but reported a lightheaded sensation.  She reports that she checked her blood pressure at home and it was in the 250N systolic.  EMS confirmed that during transport, they found her blood pressure was 232/209.  She reports that she has been having some peripheral edema in her legs worsening over the last few days bilaterally.  She denies chest pain, shortness of breath, cough, abdominal pain, urinary symptoms, constipation, or diarrhea.  She denies recent trauma or head injuries.  She denies any vision changes at this time.  She does report some photophobia with headache.  On exam, lungs are clear.  Chest is nontender.  Abdomen nontender.  Patient had mild edema in her lower extremities and ankles bilaterally.  Patient had normal finger-nose-finger, sensation in all extremities, and grip strength.  Patient had no nuchal rigidity or neck pain.  No neck stiffness.  Patient had no facial droop and was alert and oriented.  According to EMS, patient was disoriented on arrival and did not no identity at first.  Family reports that her confusion has somewhat improved since arrival to the ED.  Pupils are symmetric and patient had normal extraocular movements.  No slurred speech.  Based on patient's initial blood pressure over 200 and the emergency versus intracranial hemorrhage.  Patient will have a repeat head CT to look for bleed and will be given labetalol emergent treatment of blood pressure.  Patient also workup to look for organ injury in the setting of hypertension as  well as occult infection with may have contributed to her symptoms.  Anticipate reassessment after workup and patient will likely need admission for hypertensive emergency.  Diagnostic testing results and above.  Patient found to have negative troponin.  CBC showed mild leukocytosis and hemoglobin was similar to prior at 10.2.  Metabolic panel showed a creatinine 1.6 which is slightly improved from previous values.  BNP was not elevated and lipase was normal.  Urinalysis did not show evidence of infection but did show some proteinuria.  Given patient's documented history of substance abuse, a UDS was performed negative for drugs and specifically cocaine.  Next  CT head showed no acute ab normalities and chest x-ray shows no pneumonia.  After labetalol, blood pressure improved however it also returned into the 190s and patient remained having headaches.  Tylenol was given which did not help.  I am concerned patient is having a hypertensive crisis causing her headaches.  As patient reports that she is on multiple medications that were recently increased by the family medicine team and that her headaches are severe and continuing, I feel patient needs admission to help get her blood pressure under control and help with her symptoms.  We did not find evidence of acute infection or significant organ dysfunction at this time and there is no evidence of intracranial hemorrhage but both myself and the family are uncomfortable with patient going home given the continuing headaches and uncontrolled blood pressure.  Family medicine team will be called for admission.   Final Clinical Impressions(s) / ED Diagnoses   Final diagnoses:  Hypertensive urgency  Acute intractable headache, unspecified headache type     Clinical Impression: 1. Hypertensive urgency   2. Acute intractable headache, unspecified headache type     Disposition: Admit  This note was prepared with assistance of Dragon voice  recognition software. Occasional wrong-word or sound-a-like substitutions may have occurred due to the inherent limitations of voice recognition software.     Nakya Weyand, Gwenyth Allegra, MD 12/23/17 0002

## 2017-12-22 NOTE — ED Notes (Signed)
Pt returned from CT.  Pt continues to c/o headache.  Family at bedside

## 2017-12-22 NOTE — H&P (Signed)
[facility] Admission History and Physical Service Pager: [phone]  Patient name: Kimberly Carlson Medical record number: [id number] Date of birth: 04-21-1946 Age: 72 y.o. Gender: female  Primary Care Provider: Mayo, Pete Pelt, MD Consultants: None  Code Status: FULL   Chief Complaint: headache   Assessment and Plan: Kimberly Carlson is a 72 y.o. female presenting with headache and found to have significantly elevated BP by EMS. PMH is significant for uncontrolled HTN, T2DM, HLD.   Hypertensive Urgency: Patient presenting with BP of 206/81 with headache. CT head obtained which did not show any abnormalities. ED provider performed extensive work up for evidence of end organ damage or occult infection as etiology of hypertension. Cr 1.6 somewhat improved from baseline and UA with proteinuria as seen on prior urine studies. Troponin negative and EKG without ST segment changes. LFTs WNL. CXR and UA without signs of infection. Additionally, patient with history of cocaine abuse in past. UDS notably negative for cocaine this admission. Patient reports compliance with home medications: Enalapril 10 mg and HCTZ 25 mg. Patient reports that she never started prescribed Hydralazine 25 mg TID due to fear she would not be compliant. Patient given Labetalol IV 10 mg with some improvement in blood pressure. However, BP returned to 458K systolic and patient continued to have headache. Admitting for BP control. Doubt subarachnoid hemorrhage as headache has been chronic for 6 months. Doubt CVA given lack of neurological deficits and chronic headache.  -admit to telemetry, vital signs per unit  -resume home medications of Enalapril and HCTZ  -Labetalol 10 mg IV q2h prn for SBP >180 and/or DBP >100  -tylenol and tramadol for pain control, suspect headache will improve with control of BP  -will need good outpatient BP regimen upon discharge   CKD: Cr 1.6. Baseline appears to be  1.7-2.0.  -continue to monitor on daily BMET   T2DM: Last A1c 8.2 (Dec 2018). Patient takes Lantus 30 units daily.  -start Lantus 15u daily -sSSI -CBGs AC/HS   HLD: Patient stable on ASA 81 mg daily and Lipitor 40 mg daily.  -continue home medications   FEN/GI: Heart Healthy/Carb Modified Diet  Prophylaxis: Lovenox   Disposition: Admit to FPTS; Attending Dr. Nori Riis   History of Present Illness:  Kimberly Carlson is a 72 y.o. female presenting with headache. Found to have significantly elevated BP by EMS >998 systolic. Patient reports bilateral temporal headache that has been present for about 6 months. Has been followed by PCP for elevated BPs. Recently had CT head ordered outpatient by PCP to ensure no intracranial abnormality. Patient endorses compliance with Enalapril and HCTZ. Although daughter prompts her that she takes the HCTZ. Reports that she never started Hydralazine due to fear she wouldn't be able to comply with TID dosing.   Review Of Systems: Per HPI with the following additions:   Review of Systems  Constitutional: Negative for chills and fever.  HENT: Negative for congestion.   Eyes: Negative for blurred vision.  Respiratory: Negative for cough and shortness of breath.   Cardiovascular: Negative for chest pain and leg swelling.  Gastrointestinal: Negative for abdominal pain, nausea and vomiting.    Patient Active Problem List   Diagnosis Date Noted  . Healthcare maintenance 11/13/2017  . Hypovitaminosis D 02/28/2016  . Cataract 02/27/2016  . Anemia 08/10/2015  . Hematuria, undiagnosed cause 06/09/2015  . Headache 06/08/2015  . Cocaine use 06/08/2015  . DIPLOPIA 11/28/2010  . DEGENERATIVE DISC DISEASE 11/09/2009  .  History of tobacco use 09/03/2009  . Obesity 07/08/2009  . HERNIATED LUMBAR DISC 05/14/2007  . DM2 (diabetes mellitus, type 2) ([place]) 01/23/2007  . HYPERCHOLESTEROLEMIA 01/23/2007  . Essential hypertension 01/23/2007    Past Medical  History: Past Medical History:  Diagnosis Date  . Back pain    herniated disc lower back  . Diabetes mellitus without complication ([place])   . Hypertension     Past Surgical History: Past Surgical History:  Procedure Laterality Date  . ABDOMINAL HYSTERECTOMY      Social History: Social History   Tobacco Use  . Smoking status: Former Smoker    Packs/day: 1.00    Types: Cigarettes    Start date: 04/26/2014    Last attempt to quit: 07/23/2015    Years since quitting: 2.4  . Smokeless tobacco: Never Used  . Tobacco comment: Started in her 62's with multiple quit attempts for about 1 year at a time.  Substance Use Topics  . Alcohol use: No    Alcohol/week: 0.0 oz  . Drug use: No   Additional social history: Denies drug use.  Please also refer to relevant sections of EMR.  Family History: Family History  Adopted: Yes    Allergies and Medications: Allergies  Allergen Reactions  . Amlodipine Swelling    Bilateral leg swelling with multiple doses including 2.5mg  daily.  Stopped therapy and edema improved.   . Augmentin [Amoxicillin-Pot Clavulanate] Diarrhea  . Clonidine Derivatives Other (See Comments)    Light-headedness, dizzy, extreme dry mouth  . Doxycycline Nausea And Vomiting  . Metformin Diarrhea   No current facility-administered medications on file prior to encounter.    Current Outpatient Medications on File Prior to Encounter  Medication Sig Dispense Refill  . acetaminophen (TYLENOL) 500 MG tablet Take 1,000 mg by mouth every 6 (six) hours as needed (for headaches).    Marland Kitchen aspirin (ASPIRIN CHILDRENS) 81 MG chewable tablet Chew 81 mg by mouth daily.      Marland Kitchen atorvastatin (LIPITOR) 40 MG tablet Take 1 tablet (40 mg total) by mouth daily at 6 PM. 90 tablet 3  . calcium-vitamin D (OSCAL 500/200 D-3) 500-200 MG-UNIT tablet Take 2 tablets by mouth daily with breakfast. (Patient taking differently: Take 1 tablet by mouth daily with breakfast. ) 180 tablet 3  . enalapril  (VASOTEC) 5 MG tablet Take 1 tablet (5 mg total) daily by mouth. (Patient taking differently: Take 10 mg by mouth daily. ) 90 tablet 0  . hydrochlorothiazide (HYDRODIURIL) 25 MG tablet Take 1 tablet (25 mg total) daily by mouth. 90 tablet 0  . insulin glargine (LANTUS) 100 UNIT/ML injection INJECT SUBCUTANEOUSLY 28  UNITS ONCE DAILY (Patient taking differently: Inject 30 Units into the skin daily before breakfast. ) 30 mL 2  . glucose blood (ONE TOUCH ULTRA TEST) test strip Check sugar 6 x daily 200 each 3  . hydrALAZINE (APRESOLINE) 25 MG tablet Take 1 tablet (25 mg total) by mouth 3 (three) times daily. (Patient not taking: Reported on 12/22/2017) 90 tablet 2  . Insulin Syringe-Needle U-100 (INSULIN SYRINGE 1CC/31GX5/16") 31G X 5/16" 1 ML MISC 1 each See admin instructions by Does not apply route. Use daily with insulin. 100 each 11  . ONE TOUCH LANCETS MISC 1 each by Does not apply route See admin instructions. Check blood sugar once daily. 200 each 11    Objective: BP (!) 206/81 (BP Location: Right Arm)   Pulse 92   Temp 97.9 F (36.6 C) (Oral)   Resp  18   Ht 5\' 3"  (1.6 m)   Wt 210 lb (95.3 kg)   LMP  (LMP Unknown)   SpO2 98%   BMI 37.20 kg/m  Exam: General: elderly female lying in bed in NAD  Eyes: EOMI, PERRL, keeps eyes closed throughout most of exam, evidence of cataracts  ENTM: MMM. No teeth present. Oropharynx clear.  Neck: Supple. Full ROM. No meningeal signs.  Cardiovascular: RRR. No murmurs appreciated. 2+ pedal pulses.  Respiratory: CTAB. Normal WOB.  Gastrointestinal: soft, NTND  MSK: Moves all extremities spontaneously.  Derm: Warm and dry.  Neuro: CN II-XII intact. Strength 5/5 in all extremities. Sensation intact in all extremities. Normal speech. Psych: Appropriate mood and affect.   Labs and Imaging: CBC BMET  Recent Labs  Lab 12/22/17 1929  WBC 13.0*  HGB 10.2*  HCT 31.2*  PLT 196   Recent Labs  Lab 12/22/17 1929  NA 139  K 3.4*  CL 101  CO2 26   BUN 30*  CREATININE 1.66*  GLUCOSE 158*  CALCIUM 9.2     Dg Chest 2 View  Result Date: 12/22/2017 CLINICAL DATA:  Hypertension, fatigue, leg edema, diabetes mellitus EXAM: CHEST  2 VIEW COMPARISON:  02/18/2017 FINDINGS: Upper normal heart size. Mediastinal contours and pulmonary vascularity normal. Lungs clear. No pleural effusion or pneumothorax. Scattered endplate spur formation thoracic spine. IMPRESSION: No acute abnormalities. Electronically Signed   By: Lavonia Dana M.D.   On: 12/22/2017 18:28   Ct Head Wo Contrast  Result Date: 12/22/2017 CLINICAL DATA:  Headache.  Hypertension. EXAM: CT HEAD WITHOUT CONTRAST TECHNIQUE: Contiguous axial images were obtained from the base of the skull through the vertex without intravenous contrast. COMPARISON:  Brain CT 12/19/2017 FINDINGS: Brain: Ventricles and sulci are appropriate for patient's age. No evidence for acute cortically based infarct, intracranial hemorrhage, mass lesion or mass-effect. Vascular: Internal carotid arterial vascular calcifications. Skull: Intact. Sinuses/Orbits: Paranasal sinuses are unremarkable. Mastoid air cells are unremarkable. Orbits are unremarkable. Other: None. IMPRESSION: No acute intracranial process. Electronically Signed   By: Lovey Newcomer M.D.   On: 12/22/2017 21:01   Ct Head Wo Contrast  Result Date: 12/20/2017 CLINICAL DATA:  Left-sided headaches for 1 month EXAM: CT HEAD WITHOUT CONTRAST TECHNIQUE: Contiguous axial images were obtained from the base of the skull through the vertex without intravenous contrast. COMPARISON:  11/03/2015 FINDINGS: Brain: No evidence of acute infarction, hemorrhage, hydrocephalus, extra-axial collection or mass lesion/mass effect. Vascular: No hyperdense vessel or unexpected calcification. Skull: Normal. Negative for fracture or focal lesion. Sinuses/Orbits: No acute finding. Other: None. IMPRESSION: Normal head CT Electronically Signed   By: Inez Catalina M.D.   On: 12/20/2017 08:09     Nicolette Bang, DO 12/22/2017, 10:43 PM PGY-3, [facility] pager: [phone], text pages welcome

## 2017-12-23 ENCOUNTER — Encounter (HOSPITAL_COMMUNITY): Payer: Self-pay | Admitting: *Deleted

## 2017-12-23 ENCOUNTER — Encounter (HOSPITAL_COMMUNITY): Payer: Medicare Other

## 2017-12-23 ENCOUNTER — Other Ambulatory Visit: Payer: Self-pay

## 2017-12-23 DIAGNOSIS — E559 Vitamin D deficiency, unspecified: Secondary | ICD-10-CM | POA: Diagnosis present

## 2017-12-23 DIAGNOSIS — N183 Chronic kidney disease, stage 3 (moderate): Secondary | ICD-10-CM | POA: Diagnosis not present

## 2017-12-23 DIAGNOSIS — Z87891 Personal history of nicotine dependence: Secondary | ICD-10-CM | POA: Diagnosis not present

## 2017-12-23 DIAGNOSIS — Z7982 Long term (current) use of aspirin: Secondary | ICD-10-CM | POA: Diagnosis not present

## 2017-12-23 DIAGNOSIS — I5030 Unspecified diastolic (congestive) heart failure: Secondary | ICD-10-CM | POA: Diagnosis not present

## 2017-12-23 DIAGNOSIS — E1122 Type 2 diabetes mellitus with diabetic chronic kidney disease: Secondary | ICD-10-CM | POA: Diagnosis not present

## 2017-12-23 DIAGNOSIS — I129 Hypertensive chronic kidney disease with stage 1 through stage 4 chronic kidney disease, or unspecified chronic kidney disease: Secondary | ICD-10-CM | POA: Diagnosis present

## 2017-12-23 DIAGNOSIS — R51 Headache: Secondary | ICD-10-CM | POA: Diagnosis not present

## 2017-12-23 DIAGNOSIS — I160 Hypertensive urgency: Secondary | ICD-10-CM | POA: Diagnosis not present

## 2017-12-23 DIAGNOSIS — Z794 Long term (current) use of insulin: Secondary | ICD-10-CM | POA: Diagnosis not present

## 2017-12-23 DIAGNOSIS — N184 Chronic kidney disease, stage 4 (severe): Secondary | ICD-10-CM | POA: Diagnosis present

## 2017-12-23 DIAGNOSIS — E785 Hyperlipidemia, unspecified: Secondary | ICD-10-CM | POA: Diagnosis present

## 2017-12-23 LAB — CBG MONITORING, ED
Glucose-Capillary: 126 mg/dL — ABNORMAL HIGH (ref 65–99)
Glucose-Capillary: 214 mg/dL — ABNORMAL HIGH (ref 65–99)

## 2017-12-23 LAB — BASIC METABOLIC PANEL
Anion gap: 14 (ref 5–15)
Anion gap: 12 (ref 5–15)
BUN: 34 mg/dL — ABNORMAL HIGH (ref 6–20)
BUN: 36 mg/dL — ABNORMAL HIGH (ref 6–20)
CO2: 24 mmol/L (ref 22–32)
CO2: 25 mmol/L (ref 22–32)
Calcium: 8.6 mg/dL — ABNORMAL LOW (ref 8.9–10.3)
Calcium: 8.7 mg/dL — ABNORMAL LOW (ref 8.9–10.3)
Chloride: 101 mmol/L (ref 101–111)
Chloride: 102 mmol/L (ref 101–111)
Creatinine, Ser: 2.08 mg/dL — ABNORMAL HIGH (ref 0.44–1.00)
Creatinine, Ser: 2.12 mg/dL — ABNORMAL HIGH (ref 0.44–1.00)
GFR calc Af Amer: 26 mL/min — ABNORMAL LOW (ref >60)
GFR calc Af Amer: 25 mL/min — ABNORMAL LOW (ref >60)
GFR calc non Af Amer: 22 mL/min — ABNORMAL LOW (ref >60)
GFR calc non Af Amer: 22 mL/min — ABNORMAL LOW (ref >60)
Glucose, Bld: 178 mg/dL — ABNORMAL HIGH (ref 65–99)
Glucose, Bld: 162 mg/dL — ABNORMAL HIGH (ref 65–99)
Potassium: 3.7 mmol/L (ref 3.5–5.1)
Potassium: 3.4 mmol/L — ABNORMAL LOW (ref 3.5–5.1)
Sodium: 139 mmol/L (ref 135–145)
Sodium: 139 mmol/L (ref 135–145)

## 2017-12-23 LAB — GLUCOSE, CAPILLARY
Glucose-Capillary: 153 mg/dL — ABNORMAL HIGH (ref 65–99)
Glucose-Capillary: 152 mg/dL — ABNORMAL HIGH (ref 65–99)

## 2017-12-23 LAB — CBC
HCT: 27.8 % — ABNORMAL LOW (ref 36.0–46.0)
Hemoglobin: 9.1 g/dL — ABNORMAL LOW (ref 12.0–15.0)
MCH: 29.8 pg (ref 26.0–34.0)
MCHC: 32.7 g/dL (ref 30.0–36.0)
MCV: 91.1 fL (ref 78.0–100.0)
Platelets: 195 10*3/uL (ref 150–400)
RBC: 3.05 MIL/uL — ABNORMAL LOW (ref 3.87–5.11)
RDW: 12.5 % (ref 11.5–15.5)
WBC: 12.5 10*3/uL — ABNORMAL HIGH (ref 4.0–10.5)

## 2017-12-23 MED ORDER — LABETALOL HCL 5 MG/ML IV SOLN
10.0000 mg | INTRAVENOUS | Status: DC | PRN
  Administered 2017-12-23 (×2): 10 mg via INTRAVENOUS
  Filled 2017-12-23 (×2): qty 4

## 2017-12-23 MED ORDER — LABETALOL HCL 5 MG/ML IV SOLN
20.0000 mg | INTRAVENOUS | Status: DC | PRN
  Administered 2017-12-23 – 2017-12-24 (×2): 20 mg via INTRAVENOUS
  Filled 2017-12-23 (×4): qty 4

## 2017-12-23 MED ORDER — ACETAMINOPHEN 325 MG PO TABS: 650.0000 mg | ORAL_TABLET | Freq: Four times a day (QID) | ORAL | Status: DC | PRN

## 2017-12-23 MED ORDER — HYDROCHLOROTHIAZIDE 25 MG PO TABS
25.0000 mg | ORAL_TABLET | Freq: Every day | ORAL | Status: DC
  Administered 2017-12-23 – 2017-12-24 (×2): 25 mg via ORAL
  Filled 2017-12-23 (×2): qty 1

## 2017-12-23 MED ORDER — TRAMADOL HCL 50 MG PO TABS
50.0000 mg | ORAL_TABLET | Freq: Two times a day (BID) | ORAL | Status: DC | PRN
  Administered 2017-12-23: 50 mg via ORAL
  Filled 2017-12-23: qty 1

## 2017-12-23 MED ORDER — ENOXAPARIN SODIUM 30 MG/0.3ML ~~LOC~~ SOLN
30.0000 mg | SUBCUTANEOUS | Status: DC
Start: 2017-12-23 — End: 2017-12-24
  Administered 2017-12-23 – 2017-12-24 (×2): 30 mg via SUBCUTANEOUS
  Filled 2017-12-23 (×3): qty 0.3

## 2017-12-23 MED ORDER — INSULIN ASPART 100 UNIT/ML ~~LOC~~ SOLN
0.0000 [IU] | Freq: Three times a day (TID) | SUBCUTANEOUS | Status: DC
  Administered 2017-12-23 (×2): 2 [IU] via SUBCUTANEOUS
  Administered 2017-12-23: 1 [IU] via SUBCUTANEOUS
  Administered 2017-12-24: 3 [IU] via SUBCUTANEOUS
  Administered 2017-12-24: 2 [IU] via SUBCUTANEOUS
  Filled 2017-12-23 (×2): qty 1

## 2017-12-23 MED ORDER — ACETAMINOPHEN 650 MG RE SUPP: 650.0000 mg | Freq: Four times a day (QID) | RECTAL | Status: DC | PRN

## 2017-12-23 MED ORDER — SPIRONOLACTONE 25 MG PO TABS
25.0000 mg | ORAL_TABLET | Freq: Every day | ORAL | Status: DC
  Administered 2017-12-23 – 2017-12-24 (×2): 25 mg via ORAL
  Filled 2017-12-23 (×2): qty 1

## 2017-12-23 MED ORDER — INSULIN GLARGINE 100 UNIT/ML ~~LOC~~ SOLN
15.0000 [IU] | Freq: Every day | SUBCUTANEOUS | Status: DC
  Administered 2017-12-23 – 2017-12-24 (×2): 15 [IU] via SUBCUTANEOUS
  Filled 2017-12-23 (×2): qty 0.15

## 2017-12-23 MED ORDER — ENALAPRIL MALEATE 10 MG PO TABS
10.0000 mg | ORAL_TABLET | Freq: Every day | ORAL | Status: DC
  Administered 2017-12-23 – 2017-12-24 (×2): 10 mg via ORAL
  Filled 2017-12-23 (×2): qty 1

## 2017-12-23 MED ORDER — ASPIRIN 81 MG PO CHEW
81.0000 mg | CHEWABLE_TABLET | Freq: Every day | ORAL | Status: DC
  Administered 2017-12-23 – 2017-12-24 (×2): 81 mg via ORAL
  Filled 2017-12-23 (×2): qty 1

## 2017-12-23 MED ORDER — ATORVASTATIN CALCIUM 40 MG PO TABS
40.0000 mg | ORAL_TABLET | Freq: Every day | ORAL | Status: DC
  Administered 2017-12-23: 40 mg via ORAL
  Filled 2017-12-23 (×3): qty 1

## 2017-12-23 NOTE — ED Triage Notes (Signed)
PT transported to [facility]  In [place] . Pt denies feeling Dizzy.

## 2017-12-23 NOTE — Progress Notes (Signed)
Admitting b/p at 4pm was 190/86 and patient stated it goes high when she moves around.  B/P rechecked after resting in bed for 1 hour and b/p = 180/87, PUlse = 86 - Labetalol 10mg  IV given per prn order.

## 2017-12-23 NOTE — ED Notes (Signed)
Patient received breakfast tray 

## 2017-12-23 NOTE — Progress Notes (Signed)
Current B/P = 156/62 and Pulse = 75

## 2017-12-23 NOTE — Plan of Care (Signed)
  Completed/Met Education: Knowledge of General Education information will improve 12/23/2017 1618 - Completed/Met by Alonna Buckler, RN Coping: Level of anxiety will decrease 12/23/2017 1618 - Completed/Met by Alonna Buckler, RN

## 2017-12-23 NOTE — Progress Notes (Signed)
Patient, Kimberly Carlson, admitted to room 3E09 from ED.  Patient is alert and oriented.  She was oriented to her room and personal items, call bell, telephone, and over-bed table within reach.  Patient denies pain.

## 2017-12-23 NOTE — ED Notes (Signed)
Carb Mortified Diet was ordered for Lunch. 

## 2017-12-23 NOTE — Progress Notes (Signed)
Family Medicine Teaching Service Daily Progress Note Intern Pager: [phone]  Patient name: Kimberly Carlson Medical record number: [id number] Date of birth: [birth year] Age: 72 y.o. Gender: female  Primary Care Provider: Sela Hua, MD Consultants: None Code Status: Full  Pt Overview and Major Events to Date:  1/27 - admit for hypertensive urgency  Assessment and Plan: Kimberly Carlson is a 72 y.o. female presenting with headache and found to have significantly elevated BP by EMS. PMH is significant for uncontrolled HTN, T2DM, HLD.   Hypertensive Urgency: Patient presenting with BP of 206/81 with headache. CT head obtained which did not show any abnormalities. Cr 1.6 somewhat improved from baseline and UA with proteinuria as seen on prior urine studies. Troponin negative and EKG without ST segment changes. Patient reports compliance with home medications: Enalapril 10 mg and HCTZ 25 mg. Patient never started prescribed Hydralazine 25 mg TID due to fear she would not be compliant. BP responsive to addition of Spironolactone this morning, BP then 140s SBP. Patient also states she had L arm weakness with worsened headache yesterday prompting her to come in to the ED. Weakness resolved spontaneously 2 hours later. Neuro exam wnl this am.  -resume home medications of Enalapril and HCTZ  - start Spironolactone - am BMP - Labetalol 10 mg IV q2h prn for SBP >180 and/or DBP >100  - tylenol and tramadol for pain control, suspect headache will improve with control of BP  - will need good outpatient BP regimen upon discharge  - ECHO - carotid US  CKD: Cr 1.6. Baseline appears to be 1.7-2.0.  - continue to monitor on daily BMET   T2DM: Last A1c 8.2 (Dec 2018). Patient takes Lantus 30 units daily. CBGs mid 100s. - continue Lantus 15u daily - sSSI - CBGs AC/HS   HLD: Patient stable on ASA 81 mg daily and Lipitor 40 mg daily.  - continue home medications    FEN/GI: Heart  Healthy/Carb Modified Diet  Prophylaxis: Lovenox   Disposition: continue inpatient management   Subjective:  Patient endorsing persistent headache but improved from earlier. States 4/10. No photophobia, phonophobia, no nausea.  Objective: Temp:  [97.9 F (36.6 C)] 97.9 F (36.6 C) (01/27 1731) Pulse Rate:  [75-94] 85 (01/28 0915) Resp:  [10-26] 17 (01/28 0915) BP: (130-206)/(41-108) 178/75 (01/28 0915) SpO2:  [95 %-100 %] 98 % (01/28 0915) Weight:  [210 lb (95.3 kg)] 210 lb (95.3 kg) (01/27 1731) Physical Exam: General: pleasant female lying in bed, in NAD Cardiovascular: RRR, no murmur/rubs/gallops Respiratory: CTAB, no wheezes/rales/rhonchi Abdomen: soft, NTND, +BS Extremities: warm and well perfused, no LE edema  Laboratory: Recent Labs  Lab 12/22/17 1929 12/23/17 0556  WBC 13.0* 12.5*  HGB 10.2* 9.1*  HCT 31.2* 27.8*  PLT 196 195   Recent Labs  Lab 12/22/17 1929 12/23/17 0556  NA 139 139  K 3.4* 3.7  CL 101 101  CO2 26 24  BUN 30* 34*  CREATININE 1.66* 2.08*  CALCIUM 9.2 8.6*  PROT 6.3*  --   BILITOT 0.6  --   ALKPHOS 99  --   ALT 26  --   AST 30  --   GLUCOSE 158* 178*    Imaging/Diagnostic Tests: Dg Chest 2 View  Result Date: 12/22/2017 CLINICAL DATA:  Hypertension, fatigue, leg edema, diabetes mellitus EXAM: CHEST  2 VIEW COMPARISON:  02/18/2017 FINDINGS: Upper normal heart size. Mediastinal contours and pulmonary vascularity normal. Lungs clear. No pleural effusion or pneumothorax. Scattered endplate spur formation  thoracic spine. IMPRESSION: No acute abnormalities. Electronically Signed   By: Lavonia Dana M.D.   On: 12/22/2017 18:28   Ct Head Wo Contrast  Result Date: 12/22/2017 CLINICAL DATA:  Headache.  Hypertension. EXAM: CT HEAD WITHOUT CONTRAST TECHNIQUE: Contiguous axial images were obtained from the base of the skull through the vertex without intravenous contrast. COMPARISON:  Brain CT 12/19/2017 FINDINGS: Brain: Ventricles and sulci are  appropriate for patient's age. No evidence for acute cortically based infarct, intracranial hemorrhage, mass lesion or mass-effect. Vascular: Internal carotid arterial vascular calcifications. Skull: Intact. Sinuses/Orbits: Paranasal sinuses are unremarkable. Mastoid air cells are unremarkable. Orbits are unremarkable. Other: None. IMPRESSION: No acute intracranial process. Electronically Signed   By: Lovey Newcomer M.D.   On: 12/22/2017 21:01   Rory Percy, DO 12/23/2017, 9:54 AM PGY-1, [place] Intern pager: [phone], text pages welcome

## 2017-12-24 ENCOUNTER — Inpatient Hospital Stay (HOSPITAL_COMMUNITY): Payer: Medicare Other

## 2017-12-24 DIAGNOSIS — I5030 Unspecified diastolic (congestive) heart failure: Secondary | ICD-10-CM

## 2017-12-24 DIAGNOSIS — Z794 Long term (current) use of insulin: Secondary | ICD-10-CM

## 2017-12-24 DIAGNOSIS — R51 Headache: Secondary | ICD-10-CM

## 2017-12-24 DIAGNOSIS — E1122 Type 2 diabetes mellitus with diabetic chronic kidney disease: Secondary | ICD-10-CM

## 2017-12-24 DIAGNOSIS — I160 Hypertensive urgency: Principal | ICD-10-CM

## 2017-12-24 DIAGNOSIS — N183 Chronic kidney disease, stage 3 (moderate): Secondary | ICD-10-CM

## 2017-12-24 LAB — ECHOCARDIOGRAM COMPLETE
AO mean calculated velocity dopler: 137 cm/s
AV Area VTI index: 0.93 cm2/m2
AV Area VTI: 1.88 cm2
AV Area mean vel: 1.89 cm2
AV Mean grad: 9 mmHg
AV Peak grad: 15 mmHg
AV VEL mean LVOT/AV: 0.6
AV area mean vel ind: 0.9 cm2/m2
AV peak Index: 0.9
AV pk vel: 195 cm/s
AV vel: 1.96
Ao pk vel: 0.6 m/s
E decel time: 215 msec
E/e' ratio: 8.48
FS: 24 % — AB (ref 28–44)
Height: 63 in
IVS/LV PW RATIO, ED: 1.09
LA ID, A-P, ES: 35 mm
LA diam end sys: 35 mm
LA diam index: 1.67 cm/m2
LA vol A4C: 42.7 ml
LA vol index: 20.8 mL/m2
LA vol: 43.6 mL
LV E/e' medial: 8.48
LV E/e'average: 8.48
LV PW d: 12.5 mm — AB (ref 0.6–1.1)
LV e' LATERAL: 7.29 cm/s
LVOT SV: 83 mL
LVOT VTI: 26.3 cm
LVOT area: 3.14 cm2
LVOT diameter: 20 mm
LVOT peak VTI: 0.62 cm
LVOT peak grad rest: 5 mmHg
LVOT peak vel: 117 cm/s
Lateral S' vel: 16.2 cm/s
MV Dec: 215
MV pk A vel: 84.5 m/s
MV pk E vel: 61.8 m/s
TAPSE: 18.9 mm
TDI e' lateral: 7.29
TDI e' medial: 5.55
VTI: 42.2 cm
Valve area index: 0.93
Valve area: 1.96 cm2
Weight: 3291.2 oz

## 2017-12-24 LAB — BASIC METABOLIC PANEL
Anion gap: 12 (ref 5–15)
BUN: 38 mg/dL — ABNORMAL HIGH (ref 6–20)
CO2: 24 mmol/L (ref 22–32)
Calcium: 8.9 mg/dL (ref 8.9–10.3)
Chloride: 105 mmol/L (ref 101–111)
Creatinine, Ser: 2.12 mg/dL — ABNORMAL HIGH (ref 0.44–1.00)
GFR calc Af Amer: 25 mL/min — ABNORMAL LOW (ref >60)
GFR calc non Af Amer: 22 mL/min — ABNORMAL LOW (ref >60)
Glucose, Bld: 163 mg/dL — ABNORMAL HIGH (ref 65–99)
Potassium: 3.7 mmol/L (ref 3.5–5.1)
Sodium: 141 mmol/L (ref 135–145)

## 2017-12-24 LAB — GLUCOSE, CAPILLARY
Glucose-Capillary: 162 mg/dL — ABNORMAL HIGH (ref 65–99)
Glucose-Capillary: 203 mg/dL — ABNORMAL HIGH (ref 65–99)

## 2017-12-24 MED ORDER — ENALAPRIL MALEATE 5 MG PO TABS: 10.0000 mg | ORAL_TABLET | Freq: Every day | ORAL | 0 refills | Status: DC

## 2017-12-24 MED ORDER — SPIRONOLACTONE 25 MG PO TABS: 25.0000 mg | ORAL_TABLET | Freq: Every day | ORAL | 0 refills | Status: DC

## 2017-12-24 NOTE — Plan of Care (Signed)
  Progressing Clinical Measurements: Ability to maintain clinical measurements within normal limits will improve 12/24/2017 0852 - Progressing by Alonna Buckler, RN Cardiovascular complication will be avoided 12/24/2017 1655 - Progressing by Alonna Buckler, RN

## 2017-12-24 NOTE — Evaluation (Addendum)
Physical Therapy Evaluation Patient Details Name: Kimberly Carlson MRN: [id number] DOB: 1946-05-24 Today's Date: 12/24/2017   History of Present Illness  Pt is a 72 y.o. female admitted 12/22/17 with chronic headache; found to have significantly elevated BP. Head CT negative for acute abnormality. Bilateral carotid duplex shows 1-38% ICA stenosis. PMH includes uncontrolled HTN, DMII, CKD, HLD, chronic back pain.    Clinical Impression  Patient evaluated by Physical Therapy with no further acute PT needs identified. Pt currently indep with all mobility in room; participation only limited by elevated BP (156/135 & again at 193/72 in sitting with c/o slight headache; RN notified). All education has been completed and the patient has no further questions. PT is signing off. Thank you for this referral.    Follow Up Recommendations No PT follow up;Supervision - Intermittent    Equipment Recommendations  None recommended by PT    Recommendations for Other Services       Precautions / Restrictions Precautions Precautions: None Precaution Comments: Watch BP Restrictions Weight Bearing Restrictions: No      Mobility  Bed Mobility Overal bed mobility: Independent             General bed mobility comments: Increased BP in sitting  Transfers Overall transfer level: Independent                  Ambulation/Gait Ambulation/Gait assistance: Independent Ambulation Distance (Feet): 30 Feet Assistive device: None Gait Pattern/deviations: WFL(Within Functional Limits)     General Gait Details: Indep with ambulation to/from bathroom with no DME; distance limited secondary to elevated BP  Stairs            Wheelchair Mobility    Modified Rankin (Stroke Patients Only)       Balance Overall balance assessment: Independent                                           Pertinent Vitals/Pain Pain Assessment: Faces Faces Pain Scale: Hurts a little  bit Pain Location: Headache Pain Descriptors / Indicators: Headache Pain Intervention(s): Monitored during session;Limited activity within patient's tolerance    Home Living Family/patient expects to be discharged to:: Private residence Living Arrangements: Children Available Help at Discharge: Family;Available PRN/intermittently Type of Home: House Home Access: Stairs to enter Entrance Stairs-Rails: Psychiatric nurse of Steps: 6-7 Home Layout: One level Home Equipment: Cane - single point      Prior Function Level of Independence: Independent with assistive device(s)         Comments: Indep with household ambulation and ADLs. Uses cane when walking outdoors secondary to chronic back pain. Daughter provides transportation since pt does not have license; pt applying for [employer]AT     Hand Dominance        Extremity/Trunk Assessment   Upper Extremity Assessment Upper Extremity Assessment: Overall WFL for tasks assessed    Lower Extremity Assessment Lower Extremity Assessment: Overall WFL for tasks assessed       Communication   Communication: No difficulties  Cognition Arousal/Alertness: Awake/alert Behavior During Therapy: WFL for tasks assessed/performed Overall Cognitive Status: Within Functional Limits for tasks assessed                                        General Comments General comments (skin integrity, edema,  etc.): In sitting, BP initially 156/135 in R arm; measured again at 193/72 in L arm (RN notified)    Exercises     Assessment/Plan    PT Assessment Patent does not need any further PT services  PT Problem List         PT Treatment Interventions      PT Goals (Current goals can be found in the Care Plan section)  Acute Rehab PT Goals PT Goal Formulation: All assessment and education complete, DC therapy    Frequency     Barriers to discharge        Co-evaluation               AM-PAC PT "6  Clicks" Daily Activity  Outcome Measure Difficulty turning over in bed (including adjusting bedclothes, sheets and blankets)?: None Difficulty moving from lying on back to sitting on the side of the bed? : None Difficulty sitting down on and standing up from a chair with arms (e.g., wheelchair, bedside commode, etc,.)?: None Help needed moving to and from a bed to chair (including a wheelchair)?: None Help needed walking in hospital room?: None Help needed climbing 3-5 steps with a railing? : None 6 Click Score: 24    End of Session   Activity Tolerance: Patient tolerated treatment well;Treatment limited secondary to medical complications (Comment)(HTN) Patient left: in chair;with call bell/phone within reach Nurse Communication: Mobility status;Other (comment)(elevated BP) PT Visit Diagnosis: Other abnormalities of gait and mobility (R26.89)    Time: 8115-7262 PT Time Calculation (min) (ACUTE ONLY): 15 min   Charges:   PT Evaluation $PT Eval Moderate Complexity: 1 Mod     PT G Codes:       Mabeline Caras, PT, DPT Acute Rehab Services  Pager: [place] 12/24/2017, 1:44 PM

## 2017-12-24 NOTE — Progress Notes (Signed)
Family Medicine Teaching Service Daily Progress Note Intern Pager: [phone]  Patient name: Kimberly Carlson Medical record number: [id number] Date of birth: [birth year] Age: 72 y.o. Gender: female  Primary Care Provider: Sela Hua, MD Consultants: None Code Status: Full  Pt Overview and Major Events to Date:  1/27 - admit for hypertensive urgency   Assessment and Plan: Kimberly Carlson is a 72 y.o. female presenting with headache and found to have significantly elevated BP by EMS. PMH is significant for uncontrolled HTN, T2DM, HLD.   Hypertensive Urgency: Patient presenting with BP of 206/81 with headache. CT head obtained which did not show any abnormalities. Cr 1.6>2.12. UA with proteinuria as seen on prior urine studies. Troponin negative and EKG without ST segment changes. Patient reports compliance with home medications: Enalapril 10 mg and HCTZ 25 mg however never started prescribed Hydralazine 25 mg TID due to fear she would not be compliant. BP responsive to addition of Spironolactone yesterday afternoon to BP 140s. BP 140-180s overnight. Required prn Labetalol x3. Patient's subjective weakness episode prior to admission also continues to be resolved. Neuro exam wnl this am.  - resume home medications of Enalapril 10mg  and HCTZ 25mg  - continue Spironolactone 25mg , consider adding another agent later today - daily BMP - Labetalol 20 mg IV q2h prn for SBP >180 and/or DBP >100  - tylenol and tramadol for pain control, suspect headache will improve with control of BP  - will need good outpatient BP regimen upon discharge  - ECHO - carotid US - PT consult  CKD: Cr 1.6>2.12. Baseline appears to be 1.7-2.0.  - continue to monitor on daily BMET   T2DM: Last A1c 8.2 (Dec 2018). Patient takes Lantus 30 units daily. CBGs mid 100s. - continue Lantus 15u daily - sSSI - CBGs AC/HS   HLD: Patient stable on ASA 81 mg daily and Lipitor 40 mg daily.  - continue home  medications    FEN/GI: Heart Healthy/Carb Modified Diet  Prophylaxis: Lovenox   Disposition: home pending BP control and PT recs  Subjective:  Patient feeling slightly dizzy after walking back from the bathroom. Headache improved from yesterday  Objective: Temp:  [98.4 F (36.9 C)-98.8 F (37.1 C)] 98.4 F (36.9 C) (01/29 0742) Pulse Rate:  [69-91] 76 (01/29 0840) Resp:  [12-20] 18 (01/29 0742) BP: (132-196)/(52-87) 179/60 (01/29 0840) SpO2:  [94 %-99 %] 98 % (01/29 0742) Weight:  [205 lb 11.2 oz (93.3 kg)-207 lb 1 oz (93.9 kg)] 205 lb 11.2 oz (93.3 kg) (01/29 2831) Physical Exam: General: pleasant female sitting in bed, in NAD Cardiovascular: RRR, no murmur/rubs/gallops Respiratory: CTAB, no wheezes/rales/rhonchi Abdomen: soft, NTND, +BS Extremities: warm and well perfused, no LE edema MSK: ROM grossly intact, strength 5/5 to U/LE bilaterally. No edema.  Neuro: Alert and oriented, speech normal. Optic field normal. PERRL, Extraocular movements intact. Intact symmetric sensation to light touch of face and extremities bilaterally.  Hearing grossly intact bilaterally.  Tongue protrudes normally with no deviation.  Shoulder shrug, smile symmetric.   Laboratory: Recent Labs  Lab 12/22/17 1929 12/23/17 0556  WBC 13.0* 12.5*  HGB 10.2* 9.1*  HCT 31.2* 27.8*  PLT 196 195   Recent Labs  Lab 12/22/17 1929 12/23/17 0556 12/23/17 1612 12/24/17 0549  NA 139 139 139 141  K 3.4* 3.7 3.4* 3.7  CL 101 101 102 105  CO2 26 24 25 24   BUN 30* 34* 36* 38*  CREATININE 1.66* 2.08* 2.12* 2.12*  CALCIUM 9.2 8.6* 8.7* 8.9  PROT 6.3*  --   --   --   BILITOT 0.6  --   --   --   ALKPHOS 99  --   --   --   ALT 26  --   --   --   AST 30  --   --   --   GLUCOSE 158* 178* 162* 163*    Imaging/Diagnostic Tests: Dg Chest 2 View  Result Date: 12/22/2017 CLINICAL DATA:  Hypertension, fatigue, leg edema, diabetes mellitus EXAM: CHEST  2 VIEW COMPARISON:  02/18/2017 FINDINGS: Upper normal  heart size. Mediastinal contours and pulmonary vascularity normal. Lungs clear. No pleural effusion or pneumothorax. Scattered endplate spur formation thoracic spine. IMPRESSION: No acute abnormalities. Electronically Signed   By: Lavonia Dana M.D.   On: 12/22/2017 18:28   Ct Head Wo Contrast  Result Date: 12/22/2017 CLINICAL DATA:  Headache.  Hypertension. EXAM: CT HEAD WITHOUT CONTRAST TECHNIQUE: Contiguous axial images were obtained from the base of the skull through the vertex without intravenous contrast. COMPARISON:  Brain CT 12/19/2017 FINDINGS: Brain: Ventricles and sulci are appropriate for patient's age. No evidence for acute cortically based infarct, intracranial hemorrhage, mass lesion or mass-effect. Vascular: Internal carotid arterial vascular calcifications. Skull: Intact. Sinuses/Orbits: Paranasal sinuses are unremarkable. Mastoid air cells are unremarkable. Orbits are unremarkable. Other: None. IMPRESSION: No acute intracranial process. Electronically Signed   By: Lovey Newcomer M.D.   On: 12/22/2017 21:01   Rory Percy, DO 12/24/2017, 9:16 AM PGY-1, [place] Intern pager: [phone], text pages welcome

## 2017-12-24 NOTE — Discharge Summary (Signed)
[facility] Discharge Summary  Patient name: Kimberly Carlson Medical record number: [id number] Date of birth: [birth year] Age: 72 y.o. Gender: female Date of Admission: 12/22/2017  Date of Discharge: 12/24/2017 Admitting Physician: Dickie La, MD  Primary Care Provider: Sela Hua, MD Consultants: none  Indication for Hospitalization: Hypertensive Urgency  Discharge Diagnoses/Problem List:  Hypertensive Urgency, resolved CKD, stable Type 2 Diabetes, stable HLD, stable  Disposition: Home  Discharge Condition: Improved  Discharge Exam:  General: pleasant female sitting in bed, in NAD Cardiovascular: RRR, no murmur/rubs/gallops Respiratory: CTAB, no wheezes/rales/rhonchi Abdomen: soft, NTND, +BS Extremities: warm and well perfused, no LE edema MSK:ROM grossly intact, strength 5/5 to U/LE bilaterally. No edema.  Neuro:Alert and oriented, speech normal. Optic field normal. PERRL, Extraocular movements intact.Intact symmetric sensation to light touch of face and extremities bilaterally. Hearing grossly intact bilaterally. Tongue protrudes normally with no deviation. Shoulder shrug, smile symmetric.   Brief Hospital Course:  Kimberly Carlson a 72 y.o.femalewith PMH significant for uncontrolled HTN, T2DM, HLD presenting with persistent headache and found to have significantly elevated BP by EMS to SBP 200s. CT Head without abnormalities. Troponin negative and EKG without ST segment changes. Patient reports compliance with home medications:Enalapril 10 mg and HCTZ 25 mg however never started prescribedHydralazine25 mg TID due to fear she would not be compliant. Patient's blood pressure was managed on home antihypertensive regimen with the addition of Spironolactone with good response. Patient also reported on admission a self-resolved episode of R arm weakness that lasted about 2 hours. Neuro exams were consistently within normal limits.  Carotid US and ECHO without significant abnormalities. Patient's headache resolved with good BP control prior to discharge and weakness continued to be resolved.  Issues for Follow Up:  1. Medication Changes: 1. Started Spironolactone 25mg  daily 2. Continued home Enalapril 10mg , HCTZ 25mg  2. Ensure compliance and toleration of new BP regimen.  Significant Procedures: None  Significant Labs and Imaging:  Recent Labs  Lab 12/22/17 1929 12/23/17 0556  WBC 13.0* 12.5*  HGB 10.2* 9.1*  HCT 31.2* 27.8*  PLT 196 195   Recent Labs  Lab 12/22/17 1929 12/23/17 0556 12/23/17 1612 12/24/17 0549  NA 139 139 139 141  K 3.4* 3.7 3.4* 3.7  CL 101 101 102 105  CO2 26 24 25 24   GLUCOSE 158* 178* 162* 163*  BUN 30* 34* 36* 38*  CREATININE 1.66* 2.08* 2.12* 2.12*  CALCIUM 9.2 8.6* 8.7* 8.9  ALKPHOS 99  --   --   --   AST 30  --   --   --   ALT 26  --   --   --   ALBUMIN 3.1*  --   --   --    1/29 ECHO Study Conclusions - Left ventricle: The cavity size was normal. There was mild   concentric hypertrophy. Systolic function was normal. The   estimated ejection fraction was in the range of 60% to 65%. Wall   motion was normal; there were no regional wall motion   abnormalities. Doppler parameters are consistent with abnormal   left ventricular relaxation (grade 1 diastolic dysfunction).   Doppler parameters are consistent with indeterminate ventricular   filling pressure. - Aortic valve: There was no regurgitation. Valve area (VTI): 1.96   cm^2. Valve area (Vmax): 1.88 cm^2. Valve area (Vmean): 1.89   cm^2. - Mitral valve: Transvalvular velocity was within the normal range.   There was no evidence for stenosis. There was no  regurgitation. - Right ventricle: The cavity size was normal. Wall thickness was   normal. Systolic function was normal. - Tricuspid valve: There was no regurgitation. - Pericardium, extracardiac: A trivial pericardial effusion was   identified.  1/29 Carotid  US Final Interpretation: Right Carotid: Velocities in the right ICA are consistent with a 1-39% stenosis. Left Carotid: Velocities in the left ICA are consistent with a 1-39% stenosis. Vertebrals: Both vertebral arteries were patent with antegrade flow. Subclavians: Normal flow hemodynamics were seen in bilateral subclavian arteries.  1/27 CT Head FINDINGS: Brain: Ventricles and sulci are appropriate for patient's age. No evidence for acute cortically based infarct, intracranial hemorrhage, mass lesion or mass-effect. Vascular: Internal carotid arterial vascular calcifications. Skull: Intact. Sinuses/Orbits: Paranasal sinuses are unremarkable. Mastoid air cells are unremarkable. Orbits are unremarkable. Other: None. IMPRESSION: No acute intracranial process.  1/27 CXR FINDINGS: Upper normal heart size. Mediastinal contours and pulmonary vascularity normal. Lungs clear. No pleural effusion or pneumothorax. Scattered endplate spur formation thoracic spine. IMPRESSION: No acute abnormalities.  Results/Tests Pending at Time of Discharge: None  Discharge Medications:  Allergies as of 12/24/2017      Reactions   Amlodipine Swelling   Bilateral leg swelling with multiple doses including 2.5mg  daily.  Stopped therapy and edema improved.    Augmentin [amoxicillin-pot Clavulanate] Diarrhea   Clonidine Derivatives Other (See Comments)   Light-headedness, dizzy, extreme dry mouth   Doxycycline Nausea And Vomiting   Metformin Diarrhea      Medication List    STOP taking these medications   hydrALAZINE 25 MG tablet Commonly known as:  APRESOLINE     TAKE these medications   acetaminophen 500 MG tablet Commonly known as:  TYLENOL Take 1,000 mg by mouth every 6 (six) hours as needed (for headaches).   ASPIRIN CHILDRENS 81 MG chewable tablet Generic drug:  aspirin Chew 81 mg by mouth daily.   atorvastatin 40 MG tablet Commonly known as:  LIPITOR Take 1 tablet (40 mg  total) by mouth daily at 6 PM.   calcium-vitamin D 500-200 MG-UNIT tablet Commonly known as:  OSCAL 500/200 D-3 Take 2 tablets by mouth daily with breakfast. What changed:  how much to take   enalapril 5 MG tablet Commonly known as:  VASOTEC Take 2 tablets (10 mg total) by mouth daily.   glucose blood test strip Commonly known as:  ONE TOUCH ULTRA TEST Check sugar 6 x daily   hydrochlorothiazide 25 MG tablet Commonly known as:  HYDRODIURIL Take 1 tablet (25 mg total) daily by mouth.   insulin glargine 100 UNIT/ML injection Commonly known as:  LANTUS INJECT SUBCUTANEOUSLY 28  UNITS ONCE DAILY What changed:    how much to take  how to take this  when to take this  additional instructions   INSULIN SYRINGE 1CC/31GX5/16" 31G X 5/16" 1 ML Misc 1 each See admin instructions by Does not apply route. Use daily with insulin.   ONE TOUCH LANCETS Misc 1 each by Does not apply route See admin instructions. Check blood sugar once daily.   spironolactone 25 MG tablet Commonly known as:  ALDACTONE Take 1 tablet (25 mg total) by mouth daily.       Discharge Instructions: Please refer to Patient Instructions section of EMR for full details.  Patient was counseled important signs and symptoms that should prompt return to medical care, changes in medications, dietary instructions, activity restrictions, and follow up appointments.   Follow-Up Appointments: Follow-up Information    Kinnie Feil, MD.  Go on 12/31/2017.   Specialty:  Family Medicine Why:  Please go to appointment at 12/31/17 on 10:30AM. Contact information: [place] [address] [place], [place], [place] 12/27/2017, 3:59 PM PGY-1, [place].

## 2017-12-24 NOTE — Progress Notes (Signed)
Discharge instructions reviewed with patient and she stated understanding.  Patient confirmed she has personal belongings of lap top, cell phone, phone charger, purse, and clothing.  No voiced complaints. Patient discharged for home via transport from her daughter.

## 2017-12-24 NOTE — Plan of Care (Signed)
  [facility]/Discharge Planning: Ability to manage health-related needs will improve 12/24/2017 1512 - Completed/Met by Alonna Buckler, RN Clinical Measurements: Ability to maintain clinical measurements within normal limits will improve 12/24/2017 1512 - Completed/Met by Alonna Buckler, RN 12/24/2017 [phone] - Progressing by Alonna Buckler, RN Will remain free from infection 12/24/2017 1761 - Completed/Met by Alonna Buckler, RN Diagnostic test results will improve 12/24/2017 1512 - Completed/Met by Alonna Buckler, RN Respiratory complications will improve 12/24/2017 0852 - Completed/Met by Alonna Buckler, RN Cardiovascular complication will be avoided 12/24/2017 1512 - Completed/Met by Alonna Buckler, RN 12/24/2017 [phone] - Progressing by Alonna Buckler, RN Activity: Risk for activity intolerance will decrease 12/24/2017 1512 - Completed/Met by Alonna Buckler, RN Nutrition: Adequate nutrition will be maintained 12/24/2017 1512 - Completed/Met by Alonna Buckler, RN Elimination: Will not experience complications related to bowel motility 12/24/2017 0852 - Completed/Met by Alonna Buckler, RN Will not experience complications related to urinary retention 12/24/2017 0852 - Completed/Met by Alonna Buckler, RN Pain Managment: General experience of comfort will improve 12/24/2017 0852 - Completed/Met by Alonna Buckler, RN Safety: Ability to remain free from injury will improve 12/24/2017 1512 - Completed/Met by Alonna Buckler, RN Skin Integrity: Risk for impaired skin integrity will decrease 12/24/2017 1512 - Completed/Met by Alonna Buckler, RN

## 2017-12-24 NOTE — Progress Notes (Signed)
  Echocardiogram 2D Echocardiogram has been performed.  Kimberly Carlson 12/24/2017, 10:01 AM

## 2017-12-24 NOTE — Consult Note (Signed)
   [facility] CM Inpatient Consult   12/24/2017  ASA FATH May 29, 1946 [id number]  Patient evaluated for community based chronic disease management services with [facility] as a benefit of patient's Medicare Insurance. Spoke with patient at bedside to explain [facility] services. Patient states, "I have trouble getting my blood pressure down and getting to appointments sometimes because my grandson has to see if he can get off of work." Patient endorse Dr. Hyman Bible as her primary care provider at [facility].  This office generally provides the transition of care follow up calls and visits.    Patient will receive post hospital General EMMI calls  for assessments and disease process education.  Left contact information and [facility] literature at bedside. Made Inpatient Case Manager aware that [facility] following. Consent form signed and copy with folder given with 24 hour nurse advise line and contact information.  Information given for ride assistance as she is still trying to get her SCAT application completed.  Of note, [facility] Care Management services does not replace or interfere with any services that are arranged by inpatient case management or social work.  For additional questions or referrals please contact:     Natividad Brood, RN BSN [facility] Liaison  [phone] business mobile phone Toll free office [phone]

## 2017-12-24 NOTE — Plan of Care (Signed)
  Clinical Measurements: Cardiovascular complication will be avoided 12/24/2017 0049 - Progressing by Theador Hawthorne, RN   Activity: Risk for activity intolerance will decrease 12/24/2017 0049 - Progressing by Theador Hawthorne, RN   Safety: Ability to remain free from injury will improve 12/24/2017 0049 - Progressing by Theador Hawthorne, RN   Clinical Measurements: Ability to maintain clinical measurements within normal limits will improve 12/24/2017 0049 - Progressing by Theador Hawthorne, RN

## 2017-12-24 NOTE — Progress Notes (Signed)
Bilateral carotid duplex completed. 1% to 39% ICA stenosis. Vertebral artery flow is antegrade.

## 2017-12-24 NOTE — Progress Notes (Signed)
Carotid dopplers completed.

## 2017-12-24 NOTE — Progress Notes (Addendum)
   12/24/17 1220  Vitals  Temp 99.1 F (37.3 C)  Temp Source Oral  BP (!) 194/65  BP Location Left Arm  BP Method Automatic  Pulse Rate 75  Pulse Rate Source Dinamap  Resp 18  Oxygen Therapy  SpO2 100 %  O2 Device Room Air   Labetolol 20mg  IV given and MD informed via text page

## 2017-12-27 ENCOUNTER — Observation Stay (HOSPITAL_COMMUNITY)
Admission: EM | Admit: 2017-12-27 | Discharge: 2017-12-28 | Disposition: A | Discharge status: Home / Self Care | Payer: Medicare Other | Attending: Family Medicine | Admitting: Family Medicine

## 2017-12-27 ENCOUNTER — Other Ambulatory Visit: Payer: Self-pay

## 2017-12-27 ENCOUNTER — Inpatient Hospital Stay: Payer: Medicare Other | Admitting: Family Medicine

## 2017-12-27 ENCOUNTER — Ambulatory Visit (INDEPENDENT_AMBULATORY_CARE_PROVIDER_SITE_OTHER): Payer: Medicare Other | Admitting: Family Medicine

## 2017-12-27 ENCOUNTER — Encounter (HOSPITAL_COMMUNITY): Payer: Self-pay | Admitting: *Deleted

## 2017-12-27 ENCOUNTER — Ambulatory Visit: Payer: Medicare Other

## 2017-12-27 VITALS — BP 200/102

## 2017-12-27 VITALS — BP 200/80 | HR 84 | Temp 98.5°F | Wt 203.0 lb

## 2017-12-27 DIAGNOSIS — E119 Type 2 diabetes mellitus without complications: Secondary | ICD-10-CM

## 2017-12-27 DIAGNOSIS — E559 Vitamin D deficiency, unspecified: Secondary | ICD-10-CM | POA: Insufficient documentation

## 2017-12-27 DIAGNOSIS — Z7982 Long term (current) use of aspirin: Secondary | ICD-10-CM | POA: Insufficient documentation

## 2017-12-27 DIAGNOSIS — I129 Hypertensive chronic kidney disease with stage 1 through stage 4 chronic kidney disease, or unspecified chronic kidney disease: Secondary | ICD-10-CM | POA: Diagnosis not present

## 2017-12-27 DIAGNOSIS — N179 Acute kidney failure, unspecified: Secondary | ICD-10-CM | POA: Diagnosis not present

## 2017-12-27 DIAGNOSIS — E669 Obesity, unspecified: Secondary | ICD-10-CM | POA: Diagnosis not present

## 2017-12-27 DIAGNOSIS — Z87891 Personal history of nicotine dependence: Secondary | ICD-10-CM | POA: Insufficient documentation

## 2017-12-27 DIAGNOSIS — E1122 Type 2 diabetes mellitus with diabetic chronic kidney disease: Secondary | ICD-10-CM | POA: Insufficient documentation

## 2017-12-27 DIAGNOSIS — E1136 Type 2 diabetes mellitus with diabetic cataract: Secondary | ICD-10-CM | POA: Diagnosis not present

## 2017-12-27 DIAGNOSIS — Z90710 Acquired absence of both cervix and uterus: Secondary | ICD-10-CM | POA: Insufficient documentation

## 2017-12-27 DIAGNOSIS — N183 Chronic kidney disease, stage 3 (moderate): Secondary | ICD-10-CM | POA: Diagnosis not present

## 2017-12-27 DIAGNOSIS — Z888 Allergy status to other drugs, medicaments and biological substances status: Secondary | ICD-10-CM | POA: Insufficient documentation

## 2017-12-27 DIAGNOSIS — E1159 Type 2 diabetes mellitus with other circulatory complications: Secondary | ICD-10-CM | POA: Diagnosis present

## 2017-12-27 DIAGNOSIS — I160 Hypertensive urgency: Secondary | ICD-10-CM

## 2017-12-27 DIAGNOSIS — I10 Essential (primary) hypertension: Secondary | ICD-10-CM | POA: Diagnosis not present

## 2017-12-27 DIAGNOSIS — Z6835 Body mass index (BMI) 35.0-35.9, adult: Secondary | ICD-10-CM | POA: Diagnosis not present

## 2017-12-27 DIAGNOSIS — R51 Headache: Secondary | ICD-10-CM | POA: Diagnosis not present

## 2017-12-27 DIAGNOSIS — K5900 Constipation, unspecified: Secondary | ICD-10-CM | POA: Diagnosis not present

## 2017-12-27 DIAGNOSIS — M5126 Other intervertebral disc displacement, lumbar region: Secondary | ICD-10-CM | POA: Insufficient documentation

## 2017-12-27 DIAGNOSIS — Z881 Allergy status to other antibiotic agents status: Secondary | ICD-10-CM | POA: Insufficient documentation

## 2017-12-27 DIAGNOSIS — Z794 Long term (current) use of insulin: Secondary | ICD-10-CM | POA: Insufficient documentation

## 2017-12-27 DIAGNOSIS — E785 Hyperlipidemia, unspecified: Secondary | ICD-10-CM | POA: Insufficient documentation

## 2017-12-27 DIAGNOSIS — Z79899 Other long term (current) drug therapy: Secondary | ICD-10-CM | POA: Insufficient documentation

## 2017-12-27 DIAGNOSIS — Z880 Allergy status to penicillin: Secondary | ICD-10-CM | POA: Diagnosis not present

## 2017-12-27 DIAGNOSIS — I152 Hypertension secondary to endocrine disorders: Secondary | ICD-10-CM | POA: Diagnosis present

## 2017-12-27 DIAGNOSIS — E7800 Pure hypercholesterolemia, unspecified: Secondary | ICD-10-CM | POA: Diagnosis not present

## 2017-12-27 LAB — BASIC METABOLIC PANEL
Anion gap: 11 (ref 5–15)
BUN: 43 mg/dL — ABNORMAL HIGH (ref 6–20)
CO2: 24 mmol/L (ref 22–32)
Calcium: 9.2 mg/dL (ref 8.9–10.3)
Chloride: 106 mmol/L (ref 101–111)
Creatinine, Ser: 2.61 mg/dL — ABNORMAL HIGH (ref 0.44–1.00)
GFR calc Af Amer: 20 mL/min — ABNORMAL LOW (ref >60)
GFR calc non Af Amer: 17 mL/min — ABNORMAL LOW (ref >60)
Glucose, Bld: 133 mg/dL — ABNORMAL HIGH (ref 65–99)
Potassium: 3.9 mmol/L (ref 3.5–5.1)
Sodium: 141 mmol/L (ref 135–145)

## 2017-12-27 LAB — CBC
HCT: 32.5 % — ABNORMAL LOW (ref 36.0–46.0)
Hemoglobin: 10.6 g/dL — ABNORMAL LOW (ref 12.0–15.0)
MCH: 29.9 pg (ref 26.0–34.0)
MCHC: 32.6 g/dL (ref 30.0–36.0)
MCV: 91.5 fL (ref 78.0–100.0)
Platelets: 227 10*3/uL (ref 150–400)
RBC: 3.55 MIL/uL — ABNORMAL LOW (ref 3.87–5.11)
RDW: 12.3 % (ref 11.5–15.5)
WBC: 13.1 10*3/uL — ABNORMAL HIGH (ref 4.0–10.5)

## 2017-12-27 LAB — I-STAT TROPONIN, ED: Troponin i, poc: 0.01 ng/mL (ref 0.00–0.08)

## 2017-12-27 LAB — GLUCOSE, CAPILLARY: Glucose-Capillary: 230 mg/dL — ABNORMAL HIGH (ref 65–99)

## 2017-12-27 MED ORDER — ACETAMINOPHEN 325 MG PO TABS: 650.0000 mg | ORAL_TABLET | Freq: Four times a day (QID) | ORAL | Status: DC | PRN

## 2017-12-27 MED ORDER — ATORVASTATIN CALCIUM 40 MG PO TABS
40.0000 mg | ORAL_TABLET | Freq: Every day | ORAL | Status: DC
  Administered 2017-12-27: 40 mg via ORAL
  Filled 2017-12-27: qty 1

## 2017-12-27 MED ORDER — LABETALOL HCL 5 MG/ML IV SOLN
10.0000 mg | INTRAVENOUS | Status: DC | PRN
  Administered 2017-12-27: 10 mg via INTRAVENOUS
  Filled 2017-12-27: qty 4

## 2017-12-27 MED ORDER — ASPIRIN 81 MG PO CHEW
81.0000 mg | CHEWABLE_TABLET | Freq: Every day | ORAL | Status: DC
  Administered 2017-12-28: 81 mg via ORAL
  Filled 2017-12-27: qty 1

## 2017-12-27 MED ORDER — POLYETHYLENE GLYCOL 3350 17 G PO PACK
17.0000 g | PACK | Freq: Every day | ORAL | Status: DC | PRN
  Administered 2017-12-27: 17 g via ORAL
  Filled 2017-12-27: qty 1

## 2017-12-27 MED ORDER — INSULIN ASPART 100 UNIT/ML ~~LOC~~ SOLN
0.0000 [IU] | Freq: Every day | SUBCUTANEOUS | Status: DC
  Administered 2017-12-27: 2 [IU] via SUBCUTANEOUS

## 2017-12-27 MED ORDER — INSULIN GLARGINE 100 UNIT/ML ~~LOC~~ SOLN: 30.0000 [IU] | Freq: Every day | SUBCUTANEOUS | Status: DC

## 2017-12-27 MED ORDER — INSULIN ASPART 100 UNIT/ML ~~LOC~~ SOLN
0.0000 [IU] | Freq: Three times a day (TID) | SUBCUTANEOUS | Status: DC
  Administered 2017-12-28: 2 [IU] via SUBCUTANEOUS
  Administered 2017-12-28: 1 [IU] via SUBCUTANEOUS

## 2017-12-27 NOTE — ED Provider Notes (Signed)
[facility] EMERGENCY DEPARTMENT Provider Note   CSN: [id number] Arrival date & time: 12/27/17  1705     History   Chief Complaint Chief Complaint  Patient presents with  . Hypertension    HPI Kimberly Carlson is a 72 y.o. female.  Patient with history of high blood pressure, diabetes, recent admission for strokelike symptoms/high blood pressure presents with worsening blood pressure. Patient on arrival was 409 systolic. Patient says she has been compliant with 2 of her medications. Patient has not filled hydralazine as she feels she may miss doses since his 3 times daily. Patient denies chest pain short of breath or strokelike symptoms. Mild headache.blood pressure worse when she is doing things.      Past Medical History:  Diagnosis Date  . Back pain    herniated disc lower back  . Diabetes mellitus without complication ([place])   . Hypertension     Patient Active Problem List   Diagnosis Date Noted  . Hypertensive urgency 12/23/2017  . Healthcare maintenance 11/13/2017  . Hypovitaminosis D 02/28/2016  . Cataract 02/27/2016  . Anemia 08/10/2015  . Hematuria, undiagnosed cause 06/09/2015  . Headache 06/08/2015  . Cocaine use 06/08/2015  . DIPLOPIA 11/28/2010  . DEGENERATIVE DISC DISEASE 11/09/2009  . History of tobacco use 09/03/2009  . Obesity 07/08/2009  . HERNIATED LUMBAR DISC 05/14/2007  . DM2 (diabetes mellitus, type 2) ([place]) 01/23/2007  . HYPERCHOLESTEROLEMIA 01/23/2007  . Essential hypertension 01/23/2007    Past Surgical History:  Procedure Laterality Date  . ABDOMINAL HYSTERECTOMY      OB History    No data available       Home Medications    Prior to Admission medications   Medication Sig Start Date End Date Taking? Authorizing Provider  acetaminophen (TYLENOL) 500 MG tablet Take 1,000 mg by mouth every 6 (six) hours as needed (for headaches).    [provider]  aspirin (ASPIRIN CHILDRENS) 81 MG chewable tablet  Chew 81 mg by mouth daily.      [provider]  atorvastatin (LIPITOR) 40 MG tablet Take 1 tablet (40 mg total) by mouth daily at 6 PM. 02/13/17   Mayo, Pete Pelt, MD  calcium-vitamin D (OSCAL 500/200 D-3) 500-200 MG-UNIT tablet Take 2 tablets by mouth daily with breakfast. Patient taking differently: Take 1 tablet by mouth daily with breakfast.  02/27/16   Olam Idler, MD  enalapril (VASOTEC) 5 MG tablet Take 2 tablets (10 mg total) by mouth daily. 12/24/17   Kathrene Alu, MD  glucose blood (ONE TOUCH ULTRA TEST) test strip Check sugar 6 x daily 10/02/16   Mayo, Pete Pelt, MD  hydrochlorothiazide (HYDRODIURIL) 25 MG tablet Take 1 tablet (25 mg total) daily by mouth. 10/11/17   Mayo, Pete Pelt, MD  insulin glargine (LANTUS) 100 UNIT/ML injection INJECT SUBCUTANEOUSLY 28  UNITS ONCE DAILY Patient taking differently: Inject 30 Units into the skin daily before breakfast.  10/11/17   Mayo, Pete Pelt, MD  Insulin Syringe-Needle U-100 (INSULIN SYRINGE 1CC/31GX5/16") 31G X 5/16" 1 ML MISC 1 each See admin instructions by Does not apply route. Use daily with insulin. 10/11/17   Mayo, Pete Pelt, MD  ONE TOUCH LANCETS MISC 1 each by Does not apply route See admin instructions. Check blood sugar once daily. 02/21/15   Olam Idler, MD  spironolactone (ALDACTONE) 25 MG tablet Take 1 tablet (25 mg total) by mouth daily. 12/25/17   Kathrene Alu, MD    Family  History Family History  Adopted: Yes    Social History Social History   Tobacco Use  . Smoking status: Former Smoker    Packs/day: 1.00    Types: Cigarettes    Start date: 04/26/2014    Last attempt to quit: 07/23/2015    Years since quitting: 2.4  . Smokeless tobacco: Never Used  . Tobacco comment: Started in her 93's with multiple quit attempts for about 1 year at a time.  Substance Use Topics  . Alcohol use: No    Alcohol/week: 0.0 oz  . Drug use: No     Allergies   Amlodipine; Augmentin [amoxicillin-pot clavulanate];  Clonidine derivatives; Doxycycline; and Metformin   Review of Systems Review of Systems  Constitutional: Negative for chills and fever.  HENT: Negative for congestion.   Eyes: Negative for visual disturbance.  Respiratory: Negative for shortness of breath.   Cardiovascular: Negative for chest pain.  Gastrointestinal: Negative for abdominal pain and vomiting.  Genitourinary: Negative for dysuria and flank pain.  Musculoskeletal: Negative for back pain, neck pain and neck stiffness.  Skin: Negative for rash.  Neurological: Positive for headaches. Negative for syncope, weakness, light-headedness and numbness.     Physical Exam Updated Vital Signs BP (!) 193/81 (BP Location: Right Arm)   Pulse 79   Temp 98.1 F (36.7 C) (Oral)   Resp 14   LMP  (LMP Unknown)   SpO2 96%   Physical Exam  Constitutional: She is oriented to person, place, and time. She appears well-developed and well-nourished.  HENT:  Head: Normocephalic and atraumatic.  Eyes: Conjunctivae are normal. Right eye exhibits no discharge. Left eye exhibits no discharge.  Neck: Normal range of motion. Neck supple. No tracheal deviation present.  Cardiovascular: Normal rate and regular rhythm.  Pulmonary/Chest: Effort normal and breath sounds normal.  Abdominal: Soft. She exhibits no distension. There is no tenderness. There is no guarding.  Musculoskeletal: She exhibits no edema.  Neurological: She is alert and oriented to person, place, and time. No cranial nerve deficit.  Skin: Skin is warm. No rash noted.  Psychiatric: She has a normal mood and affect.  Nursing note and vitals reviewed.    ED Treatments / Results  Labs (all labs ordered are listed, but only abnormal results are displayed) Labs Reviewed  BASIC METABOLIC PANEL - Abnormal; Notable for the following components:      Result Value   Glucose, Bld 133 (*)    BUN 43 (*)    Creatinine, Ser 2.61 (*)    GFR calc non Af Amer 17 (*)    GFR calc Af Amer  20 (*)    All other components within normal limits  CBC - Abnormal; Notable for the following components:   WBC 13.1 (*)    RBC 3.55 (*)    Hemoglobin 10.6 (*)    HCT 32.5 (*)    All other components within normal limits  I-STAT TROPONIN, ED    EKG  EKG Interpretation  Date/Time:  Friday December 27 2017 17:23:00 EST Ventricular Rate:  82 PR Interval:  174 QRS Duration: 82 QT Interval:  376 QTC Calculation: 439 R Axis:   40 Text Interpretation:  Normal sinus rhythm Normal ECG Confirmed by Elnora Morrison [phone]) on 12/27/2017 6:43:00 PM       Radiology No results found.  Procedures Procedures (including critical care time)  Medications Ordered in ED Medications - No data to display   Initial Impression / Assessment and Plan / ED Course  I have reviewed the triage vital signs and the nursing notes.  Pertinent labs & imaging results that were available during my care of the patient were reviewed by me and considered in my medical decision making (see chart for details).    Patient presents with worsening blood pressure which improved since arrival without treatment. Patient has no symptoms currently. With worsening kidney function over the past week approximately 1. Creatinine discussed with family medicine. Plan for admission to hospital.  The patients results and plan were reviewed and discussed.   Any x-rays performed were independently reviewed by myself.   Differential diagnosis were considered with the presenting HPI.  Medications - No data to display  Vitals:   12/27/17 1711 12/27/17 1715 12/27/17 1834  BP: (!) 251/94 (!) 229/85 (!) 193/81  Pulse: (!) 108  79  Resp: 12  14  Temp: 98.1 F (36.7 C)    TempSrc: Oral    SpO2: 99%  96%    Final diagnoses:  Essential hypertension  Acute renal failure, unspecified acute renal failure type [facility])    Admission/ observation were discussed with the admitting physician, patient and/or family and they are  comfortable with the plan.    Final Clinical Impressions(s) / ED Diagnoses   Final diagnoses:  Essential hypertension  Acute renal failure, unspecified acute renal failure type [facility])    ED Discharge Orders    None       Elnora Morrison, MD 12/27/17 [phone]

## 2017-12-27 NOTE — Progress Notes (Signed)
Patient with extremely elevated BP and recent hospitalization, c/o headache. Changed to same day appt. Danley Danker, RN [facility] RN)

## 2017-12-27 NOTE — ED Triage Notes (Signed)
Pt reports going for blood work today and sent here due to HTN. Hx of same and recently admitted. Pt has headache and fatigue. No acute distress is noted at triage.

## 2017-12-27 NOTE — Assessment & Plan Note (Signed)
Patient with BP of 200/102 on initial presentation. During repeats in the waiting room, her BP was 204/100. Patient brought back into a room and with manual cuff, pressure was 190/70 and then 200/80.   Patient escorted to the ED given her presentation of hypertensive urgency with HA with recent hospitalization.

## 2017-12-27 NOTE — H&P (Signed)
[facility] Admission History and Physical Service Pager: [phone]  Patient name: Kimberly Carlson Medical record number: [id number] Date of birth: Jun 05, 1946 Age: 72 y.o. Gender: female  Primary Care Provider: Mayo, Pete Pelt, MD Consultants: none Code Status: full code (obtained on admission)  Chief Complaint: High blood pressure  Assessment and Plan: LATACHA TEXEIRA is a 72 y.o. female presenting with hypertensive urgency. PMH is significant for poorly controlled hypertension, diabetes, obesity, hyperlipidemia & cataract  Accelerated hypertension/isolated systolic hypertension: Improved without medication.  SBP 200 in clinic.  As low as 149 in ED without intervention.  Reports good compliance with medication (enalapril, HCTZ and spironolactone).  Last dose this morning.  She does not watch her salt intake.  Also sedentary life.  She also reports snoring and daytime sleepiness.  High risk for OSA based on stop bang score of 5-6. She had renal artery duplex in 11/2015 which was negative for renal artery stenosis.  Patient without signs of endorgan damage.  Neuro and cardiopulmonary exam within normal.  -Admit to MedSurg for observation.  Attending Dr. Erin Hearing. -Monitor blood pressure per floor protocol -Labetalol 10 mg PRN SBP greater than 180 or DBP greater than 110 -Review antihypertensive medication in the morning -UDS given previous history of cocaine use -Need a sleep study as an outpatient  Headache: currently about 2/10 on pain scale.  Mainly over left temporal area.  Stabbing and comes and goes.  This has been going on for 6 weeks.  No tenderness to palpation over temporal area.  No photophobia or lacrimation.  Denies history of skin lesion or rash. Neuro exam within normal limits -Tylenol 650 mg every 6 hours as needed  AKI on CKD-3B: Multiple possible etiologies including poorly controlled hypertension and antihypertensive medications.   Creatinine 2.61 on admission.  Creatinine was 2.12 on discharge 3 days ago.  Baseline about 1.7 -Hold nephrotoxic medications including antihypertensive medications -Check BMP in the morning  Diabetes: Last A1c 8.2% about 2 months ago.  On Lantus 30 units in the morning.  Last dose this morning -Continue home Lantus in the morning -SSI-thin  Hyperlipidemia -Continue home Lipitor  Constipation:  -MiraLAX as needed  FEN/GI: -Heart healthy and carb modified  Prophylaxis: -Lovenox renally dosed  Disposition: Admit to MedSurg for observation  History of Present Illness:  Kimberly Carlson is a 72 y.o. female presenting with elevated blood pressure  Patient was in clinic for hospital follow-up when she was noted to have headache and elevated systolic blood pressure to 200's that did not improve on repeat.  So she also went to ED for evaluation. On arrival to ED, BP 251/94.  Blood pressure improved to 149/87 without intervention.  However, she was noted to have elevated serum creatinine to 2.61 (2.12 on discharge 3 days ago).  So family medicine was called to admit patient for observation and further evaluation. Patient reports good compliance with her medication.  She reports taking all her medication this morning.  She denies watching her salt intake.  She has sedentary life.  She says she stopped taking any pain medication except Tylenol.  She reports snoring and daytime fatigue and sleepiness.  Patient reports having headache for 6 weeks.  She describes the pain as sharp and stabbing.  Pain is over her left temple.  Pain is on and off.  She says her pain is currently minimal at 2/10.  She reports managing her headache with Tylenol.  She reports using Advil in the past  but stopped.  She denies photophobia.  She denies focal numbness, tingling or weakness.  Denies facial drooping or slurred speech.  She denies chest pain or shortness of breath.  Denies URI symptoms, fever, emesis or diarrhea.   She admits to nausea and left lower quadrant pain.  She reports some constipation and straining when she have bowel movement.  She denies melena or blood in stool.  She denies dysuria or hematuria.  She admits some frequency with urination but that is chronic since she is on HCTZ.  Denies smoking cigarettes, drinking alcohol or recreational drug use.  Of note, patient was hospitalized from 1/27 to 1/29 for hypertensive urgency in the setting of not taking some of her medications.  Workup including carotid ultrasound and echocardiogram were nonrevealing. ED course: Blood pressure elevated to 251/94 on arrival.  Down to 154/68 without intervention.  BMP remarkable for serum creatinine to 2.61 (2.12 on discharge 3 days ago)  Review Of Systems:   Review of Systems  Constitutional: Negative for fever and weight loss.  HENT: Negative for sore throat.   Eyes: Negative for blurred vision, photophobia and pain.  Respiratory: Negative for cough.   Cardiovascular: Negative for chest pain, palpitations and leg swelling.  Gastrointestinal: Negative for abdominal pain, blood in stool, diarrhea, melena and vomiting.  Genitourinary: Negative for dysuria.  Musculoskeletal: Negative for myalgias.  Skin: Negative for rash.  Neurological: Positive for headaches. Negative for speech change, focal weakness and weakness.  Endo/Heme/Allergies: Does not bruise/bleed easily.  Psychiatric/Behavioral: Negative for depression and substance abuse. The patient is not nervous/anxious.     Patient Active Problem List   Diagnosis Date Noted  . Hypertensive urgency 12/23/2017  . Healthcare maintenance 11/13/2017  . Hypovitaminosis D 02/28/2016  . Cataract 02/27/2016  . Anemia 08/10/2015  . Hematuria, undiagnosed cause 06/09/2015  . Headache 06/08/2015  . Cocaine use 06/08/2015  . DIPLOPIA 11/28/2010  . DEGENERATIVE DISC DISEASE 11/09/2009  . History of tobacco use 09/03/2009  . Obesity 07/08/2009  . HERNIATED  LUMBAR DISC 05/14/2007  . DM2 (diabetes mellitus, type 2) ([place]) 01/23/2007  . HYPERCHOLESTEROLEMIA 01/23/2007  . Essential hypertension 01/23/2007    Past Medical History: Past Medical History:  Diagnosis Date  . Back pain    herniated disc lower back  . Diabetes mellitus without complication ([place])   . Hypertension     Past Surgical History: Past Surgical History:  Procedure Laterality Date  . ABDOMINAL HYSTERECTOMY      Social History: Social History   Tobacco Use  . Smoking status: Former Smoker    Packs/day: 1.00    Types: Cigarettes    Start date: 04/26/2014    Last attempt to quit: 07/23/2015    Years since quitting: 2.4  . Smokeless tobacco: Never Used  . Tobacco comment: Started in her 2's with multiple quit attempts for about 1 year at a time.  Substance Use Topics  . Alcohol use: No    Alcohol/week: 0.0 oz  . Drug use: No   Additional social history:  Please also refer to relevant sections of EMR.  Family History: Family History  Adopted: Yes   (If not completed, MUST add something in)  Allergies and Medications: Allergies  Allergen Reactions  . Amlodipine Swelling    Bilateral leg swelling with multiple doses including 2.5mg  daily.  Stopped therapy and edema improved.   . Augmentin [Amoxicillin-Pot Clavulanate] Diarrhea  . Clonidine Derivatives Other (See Comments)    Light-headedness, dizzy, extreme dry mouth  .  Doxycycline Nausea And Vomiting  . Metformin Diarrhea   No current facility-administered medications on file prior to encounter.    Current Outpatient Medications on File Prior to Encounter  Medication Sig Dispense Refill  . acetaminophen (TYLENOL) 500 MG tablet Take 1,000 mg by mouth every 6 (six) hours as needed (for headaches).    Marland Kitchen aspirin (ASPIRIN CHILDRENS) 81 MG chewable tablet Chew 81 mg by mouth daily.      Marland Kitchen atorvastatin (LIPITOR) 40 MG tablet Take 1 tablet (40 mg total) by mouth daily at 6 PM. 90 tablet 3  . calcium-vitamin  D (OSCAL 500/200 D-3) 500-200 MG-UNIT tablet Take 2 tablets by mouth daily with breakfast. (Patient taking differently: Take 1 tablet by mouth daily with breakfast. ) 180 tablet 3  . enalapril (VASOTEC) 5 MG tablet Take 2 tablets (10 mg total) by mouth daily. 60 tablet 0  . hydrochlorothiazide (HYDRODIURIL) 25 MG tablet Take 1 tablet (25 mg total) daily by mouth. 90 tablet 0  . insulin glargine (LANTUS) 100 UNIT/ML injection INJECT SUBCUTANEOUSLY 28  UNITS ONCE DAILY (Patient taking differently: Inject 30 Units into the skin daily before breakfast. ) 30 mL 2  . spironolactone (ALDACTONE) 25 MG tablet Take 1 tablet (25 mg total) by mouth daily. 30 tablet 0  . glucose blood (ONE TOUCH ULTRA TEST) test strip Check sugar 6 x daily 200 each 3  . Insulin Syringe-Needle U-100 (INSULIN SYRINGE 1CC/31GX5/16") 31G X 5/16" 1 ML MISC 1 each See admin instructions by Does not apply route. Use daily with insulin. 100 each 11  . ONE TOUCH LANCETS MISC 1 each by Does not apply route See admin instructions. Check blood sugar once daily. 200 each 11    Objective: BP (!) 187/70   Pulse 79   Temp 98.1 F (36.7 C) (Oral)   Resp 11   LMP  (LMP Unknown)   SpO2 97%  Exam: GEN: appears well, no apparent distress. Head: normocephalic and atraumatic  Eyes: conjunctiva without injection, sclera anicteric, PERRLA, EOMI, visual fields intact Nares: no rhinorrhea, congestion or erythema Oropharynx: mmm without erythema or exudation HEM: negative for cervical or periauricular lymphadenopathies CVS: RRR, nl s1 & s2, 1/6 SEM over RUSB and LUSB, no edema, no renal bruits, radial pulses 2+ bilaterally RESP: no IWOB, good air movement bilaterally, CTAB GI: BS present & normal, soft, NTND, no guarding, no rebound, no mass GU: no suprapubic or CVA tenderness MSK: no focal tenderness or notable swelling SKIN: no apparent skin lesion ENDO: negative thyromegally NEURO: alert and oiented appropriately, CN II through XII  intact, motor 5/5 in all extremities, light sensation intact, no pronator drift, finger-to-nose within normal limits PSYCH: euthymic mood with congruent affect Labs and Imaging: CBC BMET  Recent Labs  Lab 12/27/17 1717  WBC 13.1*  HGB 10.6*  HCT 32.5*  PLT 227   Recent Labs  Lab 12/27/17 1717  NA 141  K 3.9  CL 106  CO2 24  BUN 43*  CREATININE 2.61*  GLUCOSE 133*  CALCIUM 9.2     No results found.  Mercy Riding, MD 12/27/2017, 8:25 PM PGY-3, [facility] pager: [phone], text pages welcome

## 2017-12-27 NOTE — Progress Notes (Signed)
   Subjective:    Patient ID: SANYIA DINI, female    DOB: March 02, 1946, 72 y.o.   MRN: [id number]   CC: blood pressure check  HPI: Hypertension: -Patient recently discharge from hospital on Tuesday and is here for a blood pressure check. -Taking spironolactone, enalapril, and HCTZ- patient reports that she took them all today and has been compliant since her discharge - She is complaining of a headache and nausea -She has not been checking her blood pressure at home as recommended   Review of Systems  HENT: Negative for hearing loss and tinnitus.   Eyes: Positive for blurred vision. Negative for double vision.       Has cataracts  Respiratory: Negative for shortness of breath.   Cardiovascular: Negative for chest pain.  Gastrointestinal: Positive for nausea. Negative for vomiting.  Neurological: Positive for weakness and headaches. Negative for dizziness, tingling, speech change and focal weakness.   Patient Active Problem List   Diagnosis Date Noted  . Hypertensive urgency 12/23/2017  . Healthcare maintenance 11/13/2017  . Hypovitaminosis D 02/28/2016  . Cataract 02/27/2016  . Anemia 08/10/2015  . Hematuria, undiagnosed cause 06/09/2015  . Headache 06/08/2015  . Cocaine use 06/08/2015  . DIPLOPIA 11/28/2010  . DEGENERATIVE DISC DISEASE 11/09/2009  . History of tobacco use 09/03/2009  . Obesity 07/08/2009  . HERNIATED LUMBAR DISC 05/14/2007  . DM2 (diabetes mellitus, type 2) ([place]) 01/23/2007  . HYPERCHOLESTEROLEMIA 01/23/2007  . Essential hypertension 01/23/2007    Objective:  BP (!) 200/80 (BP Location: Right Arm, Patient Position: Sitting, Cuff Size: Large)   Pulse 84   Temp 98.5 F (36.9 C) (Oral)   Wt 203 lb (92.1 kg)   LMP  (LMP Unknown)   SpO2 97%   BMI 35.96 kg/m  Vitals and nursing note reviewed  General: NAD, pleasant Cardiac: RRR, normal heart sounds, no murmurs Respiratory: CTAB, normal effort Extremities: no edema or cyanosis. WWP. Skin:  warm and dry, no rashes noted MSK: BLUE and BLLE with 5/5 strength Neuro: alert and oriented, no focal deficits, CN II-XII grossly intact  Assessment & Plan:    Hypertensive urgency Patient with BP of 200/102 on initial presentation. During repeats in the waiting room, her BP was 204/100. Patient brought back into a room and with manual cuff, pressure was 190/70 and then 200/80.   Patient escorted to the ED given her presentation of hypertensive urgency with HA with recent hospitalization.    [place] Halen Mossbarger, DO Family Medicine Resident PGY-1

## 2017-12-27 NOTE — ED Notes (Signed)
Admitting MD at bedside.

## 2017-12-28 DIAGNOSIS — I10 Essential (primary) hypertension: Secondary | ICD-10-CM

## 2017-12-28 LAB — BASIC METABOLIC PANEL
Anion gap: 10 (ref 5–15)
BUN: 44 mg/dL — ABNORMAL HIGH (ref 6–20)
CO2: 25 mmol/L (ref 22–32)
Calcium: 8.7 mg/dL — ABNORMAL LOW (ref 8.9–10.3)
Chloride: 108 mmol/L (ref 101–111)
Creatinine, Ser: 2.25 mg/dL — ABNORMAL HIGH (ref 0.44–1.00)
GFR calc Af Amer: 24 mL/min — ABNORMAL LOW (ref >60)
GFR calc non Af Amer: 20 mL/min — ABNORMAL LOW (ref >60)
Glucose, Bld: 138 mg/dL — ABNORMAL HIGH (ref 65–99)
Potassium: 3.9 mmol/L (ref 3.5–5.1)
Sodium: 143 mmol/L (ref 135–145)

## 2017-12-28 LAB — CBC
HCT: 27.5 % — ABNORMAL LOW (ref 36.0–46.0)
Hemoglobin: 9 g/dL — ABNORMAL LOW (ref 12.0–15.0)
MCH: 30.3 pg (ref 26.0–34.0)
MCHC: 32.7 g/dL (ref 30.0–36.0)
MCV: 92.6 fL (ref 78.0–100.0)
Platelets: 201 10*3/uL (ref 150–400)
RBC: 2.97 MIL/uL — ABNORMAL LOW (ref 3.87–5.11)
RDW: 12.8 % (ref 11.5–15.5)
WBC: 10.3 10*3/uL (ref 4.0–10.5)

## 2017-12-28 LAB — GLUCOSE, CAPILLARY
Glucose-Capillary: 149 mg/dL — ABNORMAL HIGH (ref 65–99)
Glucose-Capillary: 167 mg/dL — ABNORMAL HIGH (ref 65–99)

## 2017-12-28 LAB — RAPID URINE DRUG SCREEN, HOSP PERFORMED
Amphetamines: NOT DETECTED
Barbiturates: NOT DETECTED
Benzodiazepines: NOT DETECTED
Cocaine: NOT DETECTED
Opiates: NOT DETECTED
Tetrahydrocannabinol: NOT DETECTED

## 2017-12-28 MED ORDER — CARVEDILOL 6.25 MG PO TABS: 6.2500 mg | ORAL_TABLET | Freq: Two times a day (BID) | ORAL | Status: DC

## 2017-12-28 MED ORDER — ENOXAPARIN SODIUM 30 MG/0.3ML ~~LOC~~ SOLN
30.0000 mg | Freq: Every day | SUBCUTANEOUS | Status: DC
  Administered 2017-12-28: 30 mg via SUBCUTANEOUS
  Filled 2017-12-28: qty 0.3

## 2017-12-28 MED ORDER — CARVEDILOL 6.25 MG PO TABS: 6.2500 mg | ORAL_TABLET | Freq: Two times a day (BID) | ORAL | 11 refills | Status: DC

## 2017-12-28 MED ORDER — POLYETHYLENE GLYCOL 3350 17 G PO PACK: 17.0000 g | PACK | Freq: Every day | ORAL | 0 refills | Status: DC | PRN

## 2017-12-28 MED ORDER — INSULIN GLARGINE 100 UNIT/ML ~~LOC~~ SOLN
30.0000 [IU] | Freq: Every morning | SUBCUTANEOUS | Status: DC
  Administered 2017-12-28: 30 [IU] via SUBCUTANEOUS
  Filled 2017-12-28: qty 0.3

## 2017-12-28 NOTE — Progress Notes (Signed)
Patient Discharge: Disposition: Patient discharged to home. Education: Reviewed medications, prescriptions, follow-up appointments and discharge instructions, verbalized understanding. IV: Discontinued IV before discharge. Telemetry: Discontinued Tele before discharge. Transportation: Patient escorted  Out of the unit in w/c till the ride. Belongings: Patient took all her belongings with her.

## 2017-12-28 NOTE — Care Management Note (Signed)
Case Management Note  Patient Details  Name: Kimberly Carlson MRN: [id number] Date of Birth: 29-Oct-1946  Subjective/Objective:  From home , pta indep, presents with htn urgency. She has PCP and medication coverage.                   Action/Plan: DC home no needs.   Expected Discharge Date:  12/28/17               Expected Discharge Plan:     In-House Referral:     Discharge planning Services  CM Consult  Post Acute Care Choice:    Choice offered to:     DME Arranged:    DME Agency:     HH Arranged:    HH Agency:     Status of Service:  Completed, signed off  If discussed at [employer] of Stay Meetings, dates discussed:    Additional Comments:  Zenon Mayo, RN 12/28/2017, 12:12 PM

## 2017-12-28 NOTE — Discharge Summary (Signed)
[facility] Discharge Summary  Patient name: Kimberly Carlson Medical record number: [id number] Date of birth: 1946/03/23 Age: 72 y.o. Gender: female Date of Admission: 12/27/2017  Date of Discharge: 12/28/17 Admitting Physician: Lind Covert, MD  Primary Care Provider: Sela Hua, MD Consultants: none  Indication for Hospitalization: elevated blood pressure   Discharge Diagnoses/Problem List:  HTN DM-2 ?OSA CKD-3 Hyperlipidemia  Disposition: home  Discharge Condition: stable  Discharge Exam: General: Appears well, no apparent distress Cardiovascular: RRR, 2/6 SEM over RUSB, no edema Respiratory: No respiratory distress, clear to auscultation bilaterally Abdomen: Bowel sounds present, soft, no tenderness Extremities: No edema  Brief Hospital Course:  Kimberly Carlson is a 72 y.o. female presenting with hypertensive urgency. PMH is significant for poorly controlled hypertension, diabetes, obesity, hyperlipidemia & cataract  Hypertensive urgency/Isolated systolic hypertension: improved without medication. Headache resolved without medication as well. She had no focal neuro finding or cardiopulmonary symptoms. Poorly controlled hypertension is multifactorial. She has high risk for OSA. She doesn't watch her salt. She has sedentary life style. Question her compliance with her medication given history. SBP elevated to 200 in clinic the day of admission and trended to 152 the day of discharge. She only received 10 mg of labetalol the night before discharge. Stopped her enalapril and aldactone on discharge due to her renal function, although improved on discharge. Started Coreg at 6.25 mg twice a day. Has been clean from cocaine for over a year. UDS negative. Continued HCTZ at 25 mg twice a day. She didn't tolerate amlodipine and clonidine in the past. Recommend sleep study and medication adjustment as needed. Emphasize the importance of life  style and limiting her salt intake.   AKI on CKD-3: sCr 2.61 on admission and improved to 2.25 prior to discharge. She is enalapril, HCTZ and aldactone which could contribute. Stopped enalapril and aldactone with the hope of getting some GFR back. Started on Coreg as above  Other chronic issues stable.  Issues for Follow Up:  1. HTN: assess BP on follow up. Needs sleep study. May be Ambulatory BP as well. Review meds. Repeat BMP 2. CKD-3: check BMP    Significant Procedures: none  Significant Labs and Imaging:  Recent Labs  Lab 12/23/17 0556 12/27/17 1717 12/28/17 0624  WBC 12.5* 13.1* 10.3  HGB 9.1* 10.6* 9.0*  HCT 27.8* 32.5* 27.5*  PLT 195 227 201   Recent Labs  Lab 12/22/17 1929 12/23/17 0556 12/23/17 1612 12/24/17 0549 12/27/17 1717 12/28/17 0624  NA 139 139 139 141 141 143  K 3.4* 3.7 3.4* 3.7 3.9 3.9  CL 101 101 102 105 106 108  CO2 26 24 25 24 24 25   GLUCOSE 158* 178* 162* 163* 133* 138*  BUN 30* 34* 36* 38* 43* 44*  CREATININE 1.66* 2.08* 2.12* 2.12* 2.61* 2.25*  CALCIUM 9.2 8.6* 8.7* 8.9 9.2 8.7*  ALKPHOS 99  --   --   --   --   --   AST 30  --   --   --   --   --   ALT 26  --   --   --   --   --   ALBUMIN 3.1*  --   --   --   --   --     Results/Tests Pending at Time of Discharge: none  Discharge Medications:  Allergies as of 12/28/2017      Reactions   Amlodipine Swelling   Bilateral leg swelling with  multiple doses including 2.5mg  daily.  Stopped therapy and edema improved.    Augmentin [amoxicillin-pot Clavulanate] Diarrhea   Clonidine Derivatives Other (See Comments)   Light-headedness, dizzy, extreme dry mouth   Doxycycline Nausea And Vomiting   Metformin Diarrhea      Medication List    STOP taking these medications   enalapril 5 MG tablet Commonly known as:  VASOTEC   spironolactone 25 MG tablet Commonly known as:  ALDACTONE     TAKE these medications   acetaminophen 500 MG tablet Commonly known as:  TYLENOL Take 1,000 mg by  mouth every 6 (six) hours as needed (for headaches).   ASPIRIN CHILDRENS 81 MG chewable tablet Generic drug:  aspirin Chew 81 mg by mouth daily.   atorvastatin 40 MG tablet Commonly known as:  LIPITOR Take 1 tablet (40 mg total) by mouth daily at 6 PM.   calcium-vitamin D 500-200 MG-UNIT tablet Commonly known as:  OSCAL 500/200 D-3 Take 2 tablets by mouth daily with breakfast. What changed:  how much to take   carvedilol 6.25 MG tablet Commonly known as:  COREG Take 1 tablet (6.25 mg total) by mouth 2 (two) times daily.   glucose blood test strip Commonly known as:  ONE TOUCH ULTRA TEST Check sugar 6 x daily   hydrochlorothiazide 25 MG tablet Commonly known as:  HYDRODIURIL Take 1 tablet (25 mg total) daily by mouth.   insulin glargine 100 UNIT/ML injection Commonly known as:  LANTUS INJECT SUBCUTANEOUSLY 28  UNITS ONCE DAILY What changed:    how much to take  how to take this  when to take this  additional instructions   INSULIN SYRINGE 1CC/31GX5/16" 31G X 5/16" 1 ML Misc 1 each See admin instructions by Does not apply route. Use daily with insulin.   ONE TOUCH LANCETS Misc 1 each by Does not apply route See admin instructions. Check blood sugar once daily.   polyethylene glycol packet Commonly known as:  MIRALAX / GLYCOLAX Take 17 g by mouth daily as needed for mild constipation.       Discharge Instructions: Please refer to Patient Instructions section of EMR for full details.  Patient was counseled important signs and symptoms that should prompt return to medical care, changes in medications, dietary instructions, activity restrictions, and follow up appointments.   Follow-Up Appointments: Follow-up Information    Mayo, Pete Pelt, MD. Schedule an appointment as soon as possible for a visit.   Specialty:  Family Medicine Contact information: [place] [address] [phone]            Mercy Riding, MD 12/28/2017, 11:22  AM PGY-3, [place]

## 2017-12-28 NOTE — Care Management Obs Status (Signed)
[facility] NOTIFICATION   Patient Details  Name: Kimberly Carlson MRN: [id number] Date of Birth: 08-Mar-1946   Medicare Observation Status Notification Given:  Yes    Zenon Mayo, RN 12/28/2017, 11:18 AM

## 2017-12-28 NOTE — Progress Notes (Signed)
Family Medicine Teaching Service Daily Progress Note Intern Pager: [phone]  Patient name: Kimberly Carlson Medical record number: [id number] Date of birth: 1946/08/27 Age: 72 y.o. Gender: female  Primary Care Provider: Mayo, Pete Pelt, MD Consultants: None Code Status: Full code (obtained on admission)  Pt Overview and Major Events to Date:  2/1: Observation for elevated blood pressure  Assessment and Plan:  Assessment and Plan: Kimberly Carlson is a 72 y.o. female presenting with hypertensive urgency. PMH is significant for poorly controlled hypertension, diabetes, obesity, hyperlipidemia & cataract  Accelerated hypertension: Improved.  BP 154/72 this morning.  Received 10 mg of labetalol last night.  Patient is high risk for sleep apnea. -Monitor blood pressure per floor protocol -Labetalol 10 mg PRN SBP greater than 180 or DBP greater than 110 -Hold Enalapril in the setting of AKI -Start Coreg. UDS negative multiple times over the last one year -Continue HCTZ 25 daily -Follow-up UDS -Need a sleep study as an outpatient  Headache: Resolved -Tylenol 650 mg every 6 hours as needed  AKI on CKD-3B:  Improved.  Serum creatinine 2.25 (2.61 on admission).  Baseline about 1.7 but 2.12 on discharge recently.  -Hold nephrotoxic medications including antihypertensive medications -Follow-up BMP  Diabetes: Last A1c 8.2% about 2 months ago.  On Lantus 30 units in the morning.  -Continue home Lantus in the morning -SSI-thin  Hyperlipidemia -Continue home Lipitor  Constipation:  -MiraLAX as needed  FEN/GI: -Heart healthy and carb modified  Prophylaxis: -Lovenox renally dosed  Disposition: discharge home  Subjective:  No complaint this morning.  Headache resolved.  Denies dyspnea, chest pain, focal numbness, tingling or weakness. She is comfortable to go home and follow up with PCP  Objective: Temp:  [98.1 F (36.7 C)-98.5 F (36.9 C)] 98.3 F (36.8 C) (02/02  0441) Pulse Rate:  [76-108] 76 (02/02 0441) Resp:  [11-18] 16 (02/02 0441) BP: (149-251)/(63-102) 154/72 (02/02 0441) SpO2:  [96 %-99 %] 99 % (02/02 0441) Weight:  [202 lb 6.4 oz (91.8 kg)-203 lb (92.1 kg)] 202 lb 6.4 oz (91.8 kg) (02/01 2145) Physical Exam: General: Appears well, no apparent distress Cardiovascular: RRR, 2/6 SEM over RUSB, no edema Respiratory: No respiratory distress, clear to auscultation bilaterally Abdomen: Bowel sounds present, soft, no tenderness Extremities: No edema  Laboratory: Recent Labs  Lab 12/22/17 1929 12/23/17 0556 12/27/17 1717  WBC 13.0* 12.5* 13.1*  HGB 10.2* 9.1* 10.6*  HCT 31.2* 27.8* 32.5*  PLT 196 195 227   Recent Labs  Lab 12/22/17 1929  12/24/17 0549 12/27/17 1717 12/28/17 0624  NA 139   < > 141 141 143  K 3.4*   < > 3.7 3.9 3.9  CL 101   < > 105 106 108  CO2 26   < > 24 24 25   BUN 30*   < > 38* 43* 44*  CREATININE 1.66*   < > 2.12* 2.61* 2.25*  CALCIUM 9.2   < > 8.9 9.2 8.7*  PROT 6.3*  --   --   --   --   BILITOT 0.6  --   --   --   --   ALKPHOS 99  --   --   --   --   ALT 26  --   --   --   --   AST 30  --   --   --   --   GLUCOSE 158*   < > 163* 133* 138*   < > = values in  this interval not displayed.    Imaging/Diagnostic Tests: No results found.  Mercy Riding, MD 12/28/2017, 8:48 AM PGY-3, [place] Intern pager: [phone], text pages welcome

## 2017-12-28 NOTE — Discharge Instructions (Addendum)
It has been a pleasure taking care of you! You were admitted due to high blood pressure.  Your blood pressure improved without medication.  We are discharging you on different blood pressure medication due to your elevated kidney number. Please, make sure to read the directions before you take them. The names and directions on how to take these medications are found on this discharge paper under medication section.  We also recommend checking your blood pressure at home.  Please call the clinic if the upper number is greater than 180 mmHg.  Please watch your salt intake as well.   Please call your primary care doctor office on Monday to schedule a follow-up appointment as soon as possible.  Once you are discharged, your primary care physician will handle any further medical issues. Please note that NO REFILLS for any discharge medications will be authorized once you are discharged, as it is imperative that you return to your primary care physician (or establish a relationship with a primary care physician if you do not have one) for your aftercare needs so that they can reassess your need for medications and monitor your lab values. Take care,  Portion Size   Choose healthier foods such as 100% whole grains, vegetables, fruits, beans, nut seeds, olive oil, most vegetable oils, fat-free dietary, wild game and fish.   Avoid sweet tea, other sweetened beverages, soda, fruit juice, cold cereal and milk and trans fat.   Eat at least 3 meals and 1-2 snacks per day.  Aim for no more than 5 hours between eating.  Eat breakfast within one hour of getting up.    Exercise at least 150 minutes per week, including weight resistance exercises 3 or 4 times per week.   Try to lose at least 7-10% of your current body weight.   Limit your salt (Sodium) intake to less than 2 gm (2000 mg) a day if you have conditions such as  elevated blood pressure, heart failure...    You may also read about DASH and/or  Mediterranean diet at the following web site if you have blood pressure or heart condition. IdentityList.se LACodes.co.za

## 2017-12-29 DIAGNOSIS — I10 Essential (primary) hypertension: Secondary | ICD-10-CM | POA: Diagnosis not present

## 2017-12-31 ENCOUNTER — Encounter: Payer: Self-pay | Admitting: Family Medicine

## 2017-12-31 ENCOUNTER — Other Ambulatory Visit: Payer: Self-pay | Admitting: Family Medicine

## 2017-12-31 ENCOUNTER — Ambulatory Visit (INDEPENDENT_AMBULATORY_CARE_PROVIDER_SITE_OTHER): Payer: Medicare Other | Admitting: Family Medicine

## 2017-12-31 ENCOUNTER — Other Ambulatory Visit: Payer: Self-pay

## 2017-12-31 VITALS — BP 170/80 | HR 91 | Temp 98.0°F | Ht 63.0 in | Wt 205.0 lb

## 2017-12-31 DIAGNOSIS — N183 Chronic kidney disease, stage 3 unspecified: Secondary | ICD-10-CM

## 2017-12-31 DIAGNOSIS — Z1231 Encounter for screening mammogram for malignant neoplasm of breast: Secondary | ICD-10-CM

## 2017-12-31 DIAGNOSIS — Z794 Long term (current) use of insulin: Secondary | ICD-10-CM | POA: Diagnosis not present

## 2017-12-31 DIAGNOSIS — N179 Acute kidney failure, unspecified: Secondary | ICD-10-CM | POA: Insufficient documentation

## 2017-12-31 DIAGNOSIS — I10 Essential (primary) hypertension: Secondary | ICD-10-CM | POA: Diagnosis not present

## 2017-12-31 DIAGNOSIS — Z1239 Encounter for other screening for malignant neoplasm of breast: Secondary | ICD-10-CM

## 2017-12-31 DIAGNOSIS — E1122 Type 2 diabetes mellitus with diabetic chronic kidney disease: Secondary | ICD-10-CM | POA: Diagnosis not present

## 2017-12-31 MED ORDER — HYDRALAZINE HCL 10 MG PO TABS: 10.0000 mg | ORAL_TABLET | Freq: Two times a day (BID) | ORAL | 0 refills | Status: DC

## 2017-12-31 NOTE — Assessment & Plan Note (Signed)
Baseline creatinine of 2.08, level was as high as 2.6 during recent hospitalization but trended down fine. Bmet rechecked today. Will call with result.

## 2017-12-31 NOTE — Progress Notes (Signed)
Subjective:     Patient ID: Kimberly Carlson, female   DOB: 1946/02/20, 72 y.o.   MRN: [id number]  HPI HTN/DM2:Home CBG checked this morning was 152. Lowest CBG 110 and the highest 170+, for the most part her CBG are fine at home. Not on any special diet at home for her DM. She is compliant with Lantus 28 units daily. Currently for HTN she is on Coreg 6.25 mg BID and HCTZ 25 mg qd. She has been compliant with her meds since she left the hospital. Denies headache today, no chest pain, occasional SOB with walking. Enalapril and Aldactone were discontinued during her last hospital admission due to renal impairment. Norvasc gave her ankle edema, hence it was d/ced.  LKT:GYBWLS GU concern. Here for follow-up HM:For routine health care.  Current Outpatient Medications on File Prior to Visit  Medication Sig Dispense Refill  . aspirin (ASPIRIN CHILDRENS) 81 MG chewable tablet Chew 81 mg by mouth daily.      Marland Kitchen atorvastatin (LIPITOR) 40 MG tablet Take 1 tablet (40 mg total) by mouth daily at 6 PM. 90 tablet 3  . calcium-vitamin D (OSCAL 500/200 D-3) 500-200 MG-UNIT tablet Take 2 tablets by mouth daily with breakfast. 180 tablet 3  . carvedilol (COREG) 6.25 MG tablet Take 1 tablet (6.25 mg total) by mouth 2 (two) times daily. 60 tablet 11  . hydrochlorothiazide (HYDRODIURIL) 25 MG tablet Take 1 tablet (25 mg total) daily by mouth. 90 tablet 0  . insulin glargine (LANTUS) 100 UNIT/ML injection INJECT SUBCUTANEOUSLY 28  UNITS ONCE DAILY (Patient taking differently: Inject 30 Units into the skin daily before breakfast. ) 30 mL 2  . acetaminophen (TYLENOL) 500 MG tablet Take 1,000 mg by mouth every 6 (six) hours as needed (for headaches).    Marland Kitchen glucose blood (ONE TOUCH ULTRA TEST) test strip Check sugar 6 x daily 200 each 3  . Insulin Syringe-Needle U-100 (INSULIN SYRINGE 1CC/31GX5/16") 31G X 5/16" 1 ML MISC 1 each See admin instructions by Does not apply route. Use daily with insulin. 100 each 11  . ONE TOUCH  LANCETS MISC 1 each by Does not apply route See admin instructions. Check blood sugar once daily. 200 each 11  . polyethylene glycol (MIRALAX / GLYCOLAX) packet Take 17 g by mouth daily as needed for mild constipation. 14 each 0   No current facility-administered medications on file prior to visit.    Past Medical History:  Diagnosis Date  . Back pain    herniated disc lower back  . Diabetes mellitus without complication ([place])   . Hypertension    Vitals:   12/31/17 1017 12/31/17 1021 12/31/17 1033 12/31/17 1035  BP: (!) 168/74 (!) 174/78 (!) 199/80 (!) 170/80  Pulse: 91     Temp: 98 F (36.7 C)     TempSrc: Oral     SpO2: 95%     Weight: 205 lb (93 kg)     Height: 5\' 3"  (1.6 m)        Review of Systems  Respiratory: Negative.   Cardiovascular: Negative.   Gastrointestinal: Negative.   Neurological: Negative for headaches.  All other systems reviewed and are negative.      Objective:   Physical Exam  Constitutional: She is oriented to person, place, and time. She appears well-developed. No distress.  Cardiovascular: Normal rate, regular rhythm and normal heart sounds.  No murmur heard. Pulmonary/Chest: Effort normal and breath sounds normal. No respiratory distress. She has no wheezes.  Abdominal:  Soft. Bowel sounds are normal. She exhibits no distension and no mass. There is no tenderness.  Musculoskeletal: Normal range of motion. She exhibits no edema.  Neurological: She is alert and oriented to person, place, and time. No cranial nerve deficit.  Nursing note and vitals reviewed.      Assessment:     HTN: DM2 AKI HM: health maintenance: Flu shot offered but she declined it. She agreed with mammogram screening and order was placed.    Plan:     Check problem list

## 2017-12-31 NOTE — Patient Instructions (Addendum)
It was nice seeing you today. Your BP is still running high despite your compliance with your medications; granted that your enalapril and Aldactone were discontinued due to pioor kidney function.  We will recheck your kidney test today. In the meantime; continue: Coreg 6.25 mg BID HCTZ 25 mg daily. Start Hydralazine 10 mg BID.   Do not start Aldactone or Enalapril till we get your lab result. You might only need to start one of these two meds in the future.  Check your home BP thrice daily and hold your medicine if it is less than 100/60. Call if BP remains higher than 180/90. F/U  in 1 week.

## 2017-12-31 NOTE — Assessment & Plan Note (Signed)
Poorly controlled BP. Currently asymptomatic. Continue Coreg and HCTZ. Start Hydralazine 10 mg BID. Bmet checked today, if back at baseline may reconsider starting Enalapril since she is a diabetic. F/U in 1 weeks to reevaluate. She is advised to return with her BP log.

## 2017-12-31 NOTE — Assessment & Plan Note (Signed)
Recent A1C of 8. Unclear if med adjustment was done at the time. Continue current dose of insulin. Work on diet and exercise. Return in March for repeat A1C and discuss further management with PCP. Continue home CBG monitoring. Keep numbers between 27 and 135.

## 2018-01-01 ENCOUNTER — Telehealth: Payer: Self-pay | Admitting: *Deleted

## 2018-01-01 LAB — BASIC METABOLIC PANEL
BUN/Creatinine Ratio: 24 (ref 12–28)
BUN: 51 mg/dL — ABNORMAL HIGH (ref 8–27)
CO2: 20 mmol/L (ref 20–29)
Calcium: 9.5 mg/dL (ref 8.7–10.3)
Chloride: 107 mmol/L — ABNORMAL HIGH (ref 96–106)
Creatinine, Ser: 2.1 mg/dL — ABNORMAL HIGH (ref 0.57–1.00)
GFR calc Af Amer: 26 mL/min/{1.73_m2} — ABNORMAL LOW (ref >59)
GFR calc non Af Amer: 23 mL/min/{1.73_m2} — ABNORMAL LOW (ref >59)
Glucose: 250 mg/dL — ABNORMAL HIGH (ref 65–99)
Potassium: 4.8 mmol/L (ref 3.5–5.2)
Sodium: 145 mmol/L — ABNORMAL HIGH (ref 134–144)

## 2018-01-01 NOTE — Telephone Encounter (Signed)
Patient informed of results and confirmed appointment with Dr. Cyndia Skeeters on 01/07/18. Loria Lacina,CMA

## 2018-01-01 NOTE — Telephone Encounter (Signed)
-----   Message from Kinnie Feil, MD sent at 01/01/2018 12:01 PM EST ----- Please contact patient to let her know that her kidney test came down to her baseline. F/U as discussed with [facility] to discuss restarting Enalapril based on her BP read. Thanks.

## 2018-01-02 NOTE — Telephone Encounter (Signed)
Thank you :)

## 2018-01-07 ENCOUNTER — Ambulatory Visit (INDEPENDENT_AMBULATORY_CARE_PROVIDER_SITE_OTHER): Payer: Medicare Other | Admitting: Student

## 2018-01-07 ENCOUNTER — Encounter: Payer: Self-pay | Admitting: Student

## 2018-01-07 ENCOUNTER — Other Ambulatory Visit: Payer: Self-pay

## 2018-01-07 VITALS — BP 175/80 | HR 86 | Temp 98.8°F | Ht 63.0 in | Wt 205.6 lb

## 2018-01-07 DIAGNOSIS — Z9189 Other specified personal risk factors, not elsewhere classified: Secondary | ICD-10-CM

## 2018-01-07 DIAGNOSIS — I10 Essential (primary) hypertension: Secondary | ICD-10-CM

## 2018-01-07 DIAGNOSIS — Z1329 Encounter for screening for other suspected endocrine disorder: Secondary | ICD-10-CM

## 2018-01-07 DIAGNOSIS — R0602 Shortness of breath: Secondary | ICD-10-CM | POA: Diagnosis not present

## 2018-01-07 DIAGNOSIS — R0600 Dyspnea, unspecified: Secondary | ICD-10-CM | POA: Diagnosis not present

## 2018-01-07 NOTE — Patient Instructions (Addendum)
It was great seeing you today! We have addressed the following issues today  Hypertension/blood pressure: Your blood pressure is 160/80 mmHg.  Your goal blood pressure is less than 140/90.  I recommend follow-up with our pharmacist for ambulatory blood pressure monitoring.  We also sent a referral to sleep study to rule out sleep apnea.  Continue your medication.  Continue watching your salt intake.  Continue DASH diet.  Good job keeping your blood pressure log.  Please bring the numbers to your appointment with Dr. Valentina Lucks.  Please bring your blood pressure cuff to that appointment as well.   If we did any lab work today, and the results require attention, either me or my nurse will get in touch with you. If everything is normal, you will get a letter in mail and a message via . If you don't hear from [place] in two weeks, please give [place] a call. Otherwise, we look forward to seeing you again at your next visit. If you have any questions or concerns before then, please call the clinic at [phone].  Please bring all your medications to every doctors visit  Sign up for My Chart to have easy access to your labs results, and communication with your Primary care physician.    Please check-out at the front desk before leaving the clinic.    Take Care,   Dr. Cyndia Skeeters

## 2018-01-07 NOTE — Progress Notes (Signed)
Subjective:    Kimberly Carlson is a 72 y.o. old female here hypertension  HPI Hypertension: she is on carvedilol 6.25 mg twice a day, hydralazine 10 mg three times a day and hydrochlorothiazide 25 mg daily. Denies issues with these medication. Reports good compliance with her medication.  Checks her blood pressure at home.  SBP ranges from 127-171.  DBP ranges from 55-71.  She reports checking over her wrist.  Reports watching his salt intake.  She started using DASH diet. Not exercising because she easily run out breath. She report shortness of breath after walking 100 yard.  Denies chest pain, orthopnea or PND. Reports some swelling around her ankles.  Stop taking Advil for pain since her recent hospitalization.  Reports snoring at night and daytime sleepiness and fatigue. Quit smoking 2016. She smoked about a pack a day for about 40 years before that.   PMH/Problem List: has DM2 (diabetes mellitus, type 2) ([place]); HYPERCHOLESTEROLEMIA; Obesity; History of tobacco use; Essential hypertension; HERNIATED LUMBAR [place]; DEGENERATIVE DISC DISEASE; DIPLOPIA; Headache; Cocaine use; Hematuria, undiagnosed cause; Anemia; Cataract; Hypovitaminosis D; Healthcare maintenance; Hypertensive urgency; Accelerated hypertension; and AKI (acute kidney injury) ([place]) on their problem list.   has a past medical history of Back pain, Diabetes mellitus without complication ([place]), and Hypertension.  FH:  Family History  Adopted: Yes    SH Social History   Tobacco Use  . Smoking status: Former Smoker    Packs/day: 1.00    Types: Cigarettes    Start date: 04/26/2014    Last attempt to quit: 07/23/2015    Years since quitting: 2.4  . Smokeless tobacco: Never Used  . Tobacco comment: Started in her 87's with multiple quit attempts for about 1 year at a time.  Substance Use Topics  . Alcohol use: No    Alcohol/week: 0.0 oz  . Drug use: No    Review of Systems Review of systems negative except for pertinent  positives and negatives in history of present illness above.     Objective:     Vitals:   01/07/18 1427 01/07/18 1456 01/07/18 1457  BP: (!) 198/88 (!) 160/80 (!) 175/80  Pulse: 86    Temp: 98.8 F (37.1 C)    TempSrc: Oral    SpO2: 97%    Weight: 205 lb 9.6 oz (93.3 kg)    Height: 5\' 3"  (1.6 m)     Body mass index is 36.42 kg/m.  Physical Exam  GEN: appears well, no apparent distress. HEM: negative for cervical or periauricular lymphadenopathies CVS: RRR, nl s1 & s2, 1/6 SEM over RUSB and LUSB, trace edema,  2+ DP and radial pulses bilaterally RESP: no IWOB, good air movement bilaterally, CTAB GI: BS present & normal, soft, NTND MSK: no focal tenderness or notable swelling SKIN: no apparent skin lesion ENDO: negative thyromegally NEURO: alert and oiented appropriately, no gross deficits  PSYCH: euthymic mood with congruent affect    Assessment and Plan:  1. Essential hypertension: suboptimally controlled systolic hypertension.  There is also discrepancy between home blood pressure and clinic blood pressure. Home SBP ranges from 127 to 130mmHg at home. Home DBP ranges from 55-4mmHg at home.  She reports good compliance with her medication.  She started watching her salt intake.  She stopped taking NSAID.  She also started on DASH diet.  Stop bang score 5-6 which indicates high risk for obstructive sleep apnea.  She had normal renal artery duplex about 2 years ago.  She would benefit from ambulatory  blood pressure monitoring.  I recommended follow-up with Dr. Valentina Carlson on this. - Basic metabolic panel - Aldosterone + renin activity w/ ratio  2. Dyspnea: Patient reports dyspnea with ambulation after about 100 yards. This is a chronic issue. She basically had a normal echocardiogram about 2 weeks ago.  No signs of fluid overload.  Lung exam within normal limits but patient had a significant smoking history. She also has history of anemia which appears to be anemia. Last CBC consistent  with ACD with Hgb of 9.0 and MCV to 93 two weeks ago. Not sure about the cause of her anemia which appears to be chronic. Dyspnea could be just deconditioning due to sedentary life style.  -Recommended follow-up with Dr. Valentina Carlson for spirometry. -Anemia panel -Check TSH.   3. At risk for obstructive sleep apnea - Ambulatory referral to Sleep Studies  Return in about 1 week (around 01/14/2018) for With Dr. Valentina Carlson for Spirometry and ambulatory blood pressure monitoring.  Mercy Riding, MD 01/07/18 Pager: [phone]

## 2018-01-08 ENCOUNTER — Other Ambulatory Visit: Payer: Self-pay | Admitting: Student

## 2018-01-08 DIAGNOSIS — E7800 Pure hypercholesterolemia, unspecified: Secondary | ICD-10-CM

## 2018-01-08 DIAGNOSIS — R809 Proteinuria, unspecified: Secondary | ICD-10-CM

## 2018-01-08 DIAGNOSIS — D649 Anemia, unspecified: Secondary | ICD-10-CM

## 2018-01-08 LAB — TSH: TSH: 0.826 u[IU]/mL (ref 0.450–4.500)

## 2018-01-08 NOTE — Progress Notes (Signed)
Called patient and discussed labs. sCr improved. TSH normal. PAC pending. Lab not able to add on anemia panel. Advised patient to return for lab draw. She agrees. Anemia panel, UPC and Lipid panel ordered.

## 2018-01-09 ENCOUNTER — Other Ambulatory Visit: Payer: Medicare Other

## 2018-01-09 DIAGNOSIS — E7800 Pure hypercholesterolemia, unspecified: Secondary | ICD-10-CM | POA: Diagnosis not present

## 2018-01-09 DIAGNOSIS — D649 Anemia, unspecified: Secondary | ICD-10-CM

## 2018-01-09 DIAGNOSIS — R809 Proteinuria, unspecified: Secondary | ICD-10-CM | POA: Diagnosis not present

## 2018-01-10 LAB — LIPID PANEL
Chol/HDL Ratio: 2.3 ratio (ref 0.0–4.4)
Cholesterol, Total: 202 mg/dL — ABNORMAL HIGH (ref 100–199)
HDL: 86 mg/dL (ref >39)
LDL Calculated: 96 mg/dL (ref 0–99)
Triglycerides: 101 mg/dL (ref 0–149)
VLDL Cholesterol Cal: 20 mg/dL (ref 5–40)

## 2018-01-10 LAB — MICROALBUMIN / CREATININE URINE RATIO
Creatinine, Urine: 65.8 mg/dL
Microalb/Creat Ratio: 4261.6 mg/g creat — ABNORMAL HIGH (ref 0.0–30.0)
Microalbumin, Urine: 2804.1 ug/mL

## 2018-01-10 LAB — ANEMIA PROFILE B
Basophils Absolute: 0 10*3/uL (ref 0.0–0.2)
Basos: 0 %
EOS (ABSOLUTE): 0.4 10*3/uL (ref 0.0–0.4)
Eos: 4 %
Ferritin: 359 ng/mL — ABNORMAL HIGH (ref 15–150)
Folate: 17.2 ng/mL (ref >3.0)
Hematocrit: 29.3 % — ABNORMAL LOW (ref 34.0–46.6)
Hemoglobin: 9.7 g/dL — ABNORMAL LOW (ref 11.1–15.9)
Immature Grans (Abs): 0 10*3/uL (ref 0.0–0.1)
Immature Granulocytes: 0 %
Iron Saturation: 21 % (ref 15–55)
Iron: 48 ug/dL (ref 27–139)
Lymphocytes Absolute: 3.4 10*3/uL — ABNORMAL HIGH (ref 0.7–3.1)
Lymphs: 31 %
MCH: 29.8 pg (ref 26.6–33.0)
MCHC: 33.1 g/dL (ref 31.5–35.7)
MCV: 90 fL (ref 79–97)
Monocytes Absolute: 0.5 10*3/uL (ref 0.1–0.9)
Monocytes: 5 %
Neutrophils Absolute: 6.4 10*3/uL (ref 1.4–7.0)
Neutrophils: 60 %
Platelets: 218 10*3/uL (ref 150–379)
RBC: 3.26 x10E6/uL — ABNORMAL LOW (ref 3.77–5.28)
RDW: 13.1 % (ref 12.3–15.4)
Retic Ct Pct: 1.4 % (ref 0.6–2.6)
Total Iron Binding Capacity: 226 ug/dL — ABNORMAL LOW (ref 250–450)
UIBC: 178 ug/dL (ref 118–369)
Vitamin B-12: 675 pg/mL (ref 232–1245)
WBC: 10.7 10*3/uL (ref 3.4–10.8)

## 2018-01-11 LAB — BASIC METABOLIC PANEL
BUN/Creatinine Ratio: 25 (ref 12–28)
BUN: 42 mg/dL — ABNORMAL HIGH (ref 8–27)
CO2: 22 mmol/L (ref 20–29)
Calcium: 9.4 mg/dL (ref 8.7–10.3)
Chloride: 101 mmol/L (ref 96–106)
Creatinine, Ser: 1.71 mg/dL — ABNORMAL HIGH (ref 0.57–1.00)
GFR calc Af Amer: 34 mL/min/{1.73_m2} — ABNORMAL LOW (ref >59)
GFR calc non Af Amer: 29 mL/min/{1.73_m2} — ABNORMAL LOW (ref >59)
Glucose: 268 mg/dL — ABNORMAL HIGH (ref 65–99)
Potassium: 4.2 mmol/L (ref 3.5–5.2)
Sodium: 140 mmol/L (ref 134–144)

## 2018-01-11 LAB — ALDOSTERONE + RENIN ACTIVITY W/ RATIO
ALDOS/RENIN RATIO: >12.6 (ref 0.0–30.0)
ALDOSTERONE: 2.1 ng/dL (ref 0.0–30.0)
Renin: <0.167 ng/mL/hr — ABNORMAL LOW (ref 0.167–5.380)

## 2018-01-13 ENCOUNTER — Other Ambulatory Visit: Payer: Self-pay | Admitting: Licensed Clinical Social Worker

## 2018-01-13 NOTE — Patient Outreach (Signed)
[facility]) Care Management  01/13/2018  Kimberly Carlson 12-19-1945 [id number]  Assessment- CSW received message from [place] "I apologize, but this request 68 weeks old. It was sent to a message box I don't normally get messages in.  Please follow up with the patient in reference to transportation needs." CSW contacted patient as soon as referral was received but was unable to reach her successfully. HIPPA compliant voice message was left encouraging a return call as soon as possible to assess her transportation needs.   Plan-CSW will complete second outreach attempt on 01/14/18.  [name], BSW, MSW, [email or web address] Phone: [phone] Fax: [phone]

## 2018-01-14 ENCOUNTER — Telehealth: Payer: Self-pay | Admitting: Student

## 2018-01-14 ENCOUNTER — Other Ambulatory Visit: Payer: Self-pay | Admitting: Licensed Clinical Social Worker

## 2018-01-14 NOTE — Telephone Encounter (Signed)
Called and discussed her lab work from her recent visit with me.  Serum creatinine improved.  She has significant proteinuria.  She has been off her Enalapril due to worsening renal function.  She is currently on hydrochlorothiazide, Coreg and hydralazine.  The later 2 started after her recent hospitalization for hypertension and AKI.  She says she has been checking her blood pressure at home.  She says SBP ranges from 120s to 150s.  She says DBP ranges from 50s-70s.  Although she could benefit from ACE inhibitors or ARB's given diabetes and CKD, adding more antihypertensive medication might drop her blood pressure significantly. So, I recommended waiting until she sees Dr. Valentina Lucks for ambulatory blood pressure monitoring.  We may stop one of her blood pressure medications and replace it with ACE inhibitors or ARB's at that time.  She voices understanding this.  She will bring her blood pressure log to visit with Dr. Valentina Lucks on 01/23/2018.

## 2018-01-14 NOTE — Patient Outreach (Signed)
[facility]) Care Management  01/14/2018  Kimberly Carlson 04-18-1946 [id number]  Assessment-CSW completed second outreach attempt today. CSW unable to reach patient successfully. CSW left another HIPPA compliant voice message encouraging patient to return call once available if still interested in [place] work services. CSW will send outreach barrier letter out at this time and will wait 10 business days to hear back from patient before closing case.  Plan-CSW willl mail unsuccessful outreach letter to patient at this time.   Kimberly Carlson, BSW, MSW, [email or web address] Phone: [phone] Fax: [phone]

## 2018-01-20 ENCOUNTER — Other Ambulatory Visit: Payer: Self-pay | Admitting: Student

## 2018-01-20 ENCOUNTER — Other Ambulatory Visit: Payer: Self-pay | Admitting: Internal Medicine

## 2018-01-20 DIAGNOSIS — I10 Essential (primary) hypertension: Secondary | ICD-10-CM

## 2018-01-23 ENCOUNTER — Ambulatory Visit (INDEPENDENT_AMBULATORY_CARE_PROVIDER_SITE_OTHER): Payer: Medicare Other | Admitting: Pharmacist

## 2018-01-23 ENCOUNTER — Encounter: Payer: Self-pay | Admitting: Pharmacist

## 2018-01-23 DIAGNOSIS — Z87891 Personal history of nicotine dependence: Secondary | ICD-10-CM

## 2018-01-23 DIAGNOSIS — I10 Essential (primary) hypertension: Secondary | ICD-10-CM

## 2018-01-23 MED ORDER — HYDRALAZINE HCL 25 MG PO TABS: 25.0000 mg | ORAL_TABLET | Freq: Three times a day (TID) | ORAL | 1 refills | Status: DC

## 2018-01-23 NOTE — Progress Notes (Signed)
Patient ID: Kimberly Carlson, female   DOB: 1946/08/06, 72 y.o.   MRN: [id number] Reviewed: Agree with Dr. Graylin Shiver documentation and management.

## 2018-01-23 NOTE — Patient Instructions (Addendum)
Please return to clinic to see Dr. Cyndia Skeeters on Monday, March 4 at 9:50am to further assess your shortness of breath.   Start taking Hydralazine 25 mg three times daily for your blood pressure as soon as you receive your prescription from [facility]. In the meantime, take 2 tablets 3 times per day of the hydralazine 10 mg tablets you have left at home.   One Monday, we will place a blood pressure monitor on you to go home on.

## 2018-01-23 NOTE — Progress Notes (Signed)
S:    Patient arrives in good spirits ambulating unassisted. Presents for lung function evaluation and ambulatory blood pressure evaluation.   Patient was referred on 01/16/2018 by Dr. Cyndia Skeeters .  Patient was last seen by Primary Care Provider on 01/07/2018. She reports she has a headache today which is typical (estimates she has a headache 28/30 days in a month). Patient reports breathing has been the same. Patient recalls a time when she was taken off of carvedilol and she "thinks breathing got better". States she can not walk to mail box without stopping to catch her breath. States she quit smoking ~2 years ago and estimates she smoked ~1 pack/day on and off for a cumulative 10 years. Reports intermittent substernal chest pain brought on at random that radiates to both shoulders. States this resolves on its own and attributes this to gas pain. Denies nocturnal awakenings 2/2 shortness of breath, denies dizziness or syncope. Reports she typically goes to bed at 2AM and awakens at 8AM.   Current BP Medications include:  Carvedilol 6.25 mg BID, hydralazine 10 mg BID, HCTZ 25 mg daily  Antihypertensives tried in the past include: enalapril (d/c'd for renal function declines)  Dietary habits include: States she has tried to cut salt out of her diet but endorses snacking on fritos occasionally. Had tacos for supper last night. Endorses rare consumption of canned foods or frozen dinners. Does have deli meat. 24hr diet recall: cereal, [place] sandwich, tacos, fritos.   O:  1+ Lower extremity pitting edema appreciated bilaterally  ECHO with EF 60-65% 12/24/17 Carotids with 1-39% stenosis bilaterally, vertebrals patent, subclavians with normal flow 12/24/17  mMRC score= >2  See "scanned report" or Documentation Flowsheet (discrete results - PFTs) for  Spirometry results. Patient provided good effort while attempting spirometry. Last PFTs done 02/26/2017 with FVC 81% predicted.  Lung Age = 78 Albuterol Neb   Lot# [id number]    Exp. 7/20  Last 3 Office BP readings: BP Readings from Last 3 Encounters:  01/07/18 (!) 175/80  12/31/17 (!) 170/80  12/28/17 (!) 159/63    Today's Office BP reading: 162/100 and 164/98 mmHg (manual reading)   BMET    Component Value Date/Time   NA 140 01/07/2018 1550   K 4.2 01/07/2018 1550   CL 101 01/07/2018 1550   CO2 22 01/07/2018 1550   GLUCOSE 268 (H) 01/07/2018 1550   GLUCOSE 138 (H) 12/28/2017 0624   BUN 42 (H) 01/07/2018 1550   CREATININE 1.71 (H) 01/07/2018 1550   CREATININE 2.02 (H) 02/07/2017 1620   CALCIUM 9.4 01/07/2018 1550   GFRNONAA 29 (L) 01/07/2018 1550   GFRNONAA 24 (L) 02/07/2017 1620   GFRAA 34 (L) 01/07/2018 1550   GFRAA 28 (L) 02/07/2017 1620     A/P:   Written pt instructions provided.  F/U Clinic visit with Dr. Cyndia Skeeters next Monday.   Total time in face to face counseling 45 minutes.  Patient seen with Leroy Libman, PharmD, and Deirdre Pippins, PGY2 Pharmacy Resident,#Spirometry: Spirometry evaluation with Pre and Post Bronchodilator reveals normal lung function without degree of reversibility, unchanged from previous. Patient has been experiencing dyspnea on exertion for several months and taking no inhaled medications. Reviewed results of pulmonary function tests.  Pt verbalized understanding of results and education.   #Chest Pain: Pt with complaints of substernal chest pain brought on at random over the last few months. Patient was evaluated by Dr. Erin Hearing. Please see accompanying note. Patient counseled to call 911 if chest pain returns  and does not resolve within 5 minutes. F/U with Dr. Cyndia Skeeters in 4 days.   #Ambulatory BP Monitoring/Hypertension: Patient with pain (headache) today which will likely cloud ambulatory BP data. Will defer ambulatory BP monitoring until Monday when she sees Dr. Cyndia Skeeters. BP above goal in office in setting of medication adherence. Tolerating BP medications well at this time. BP worsened by dietary sodium,  poor sleep cycle, obesity, and sedentary lifestyle. Therapeutic options limited by renal function. Increase hydralazine to 25 mg TID - discussed adherence techniques. Patient expresses confidence that she can take medication three times daily.  Continue HCTZ 25 mg daily and carvedilol 6.25 mg BID.  PharmD, BCPS.

## 2018-01-23 NOTE — Assessment & Plan Note (Signed)
Spirometry evaluation with Pre and Post Bronchodilator reveals normal lung function without degree of reversibility, unchanged from previous. Patient has been experiencing dyspnea on exertion for several months and taking no inhaled medications. Reviewed results of pulmonary function tests.  Pt verbalized understanding of results and education.

## 2018-01-23 NOTE — Assessment & Plan Note (Signed)
Ambulatory BP Monitoring/Hypertension: Patient with pain (headache) today which will likely cloud ambulatory BP data. Will defer ambulatory BP monitoring until Monday when she sees Dr. Cyndia Skeeters. BP above goal in office in setting of medication adherence. Tolerating BP medications well at this time. BP worsened by dietary sodium, poor sleep cycle, obesity, and sedentary lifestyle. Therapeutic options limited by renal function. Increase hydralazine to 25 mg TID - discussed adherence techniques. Patient expresses confidence that she can take medication three times daily.  Continue HCTZ 25 mg daily and carvedilol 6.25 mg BID.  PharmD, BCPS.

## 2018-01-27 ENCOUNTER — Ambulatory Visit (INDEPENDENT_AMBULATORY_CARE_PROVIDER_SITE_OTHER): Payer: Medicare Other | Admitting: Student

## 2018-01-27 ENCOUNTER — Other Ambulatory Visit: Payer: Self-pay | Admitting: Licensed Clinical Social Worker

## 2018-01-27 ENCOUNTER — Encounter: Payer: Self-pay | Admitting: Student

## 2018-01-27 ENCOUNTER — Other Ambulatory Visit: Payer: Self-pay

## 2018-01-27 VITALS — BP 212/88 | HR 101 | Temp 98.3°F | Ht 63.0 in | Wt 208.8 lb

## 2018-01-27 DIAGNOSIS — Z794 Long term (current) use of insulin: Secondary | ICD-10-CM | POA: Diagnosis not present

## 2018-01-27 DIAGNOSIS — I10 Essential (primary) hypertension: Secondary | ICD-10-CM

## 2018-01-27 DIAGNOSIS — N183 Chronic kidney disease, stage 3 unspecified: Secondary | ICD-10-CM

## 2018-01-27 DIAGNOSIS — E1122 Type 2 diabetes mellitus with diabetic chronic kidney disease: Secondary | ICD-10-CM | POA: Diagnosis not present

## 2018-01-27 MED ORDER — ENALAPRIL MALEATE 5 MG PO TABS: 5.0000 mg | ORAL_TABLET | Freq: Every day | ORAL | 0 refills | Status: DC

## 2018-01-27 MED ORDER — CLONIDINE HCL 0.1 MG PO TABS
0.1000 mg | ORAL_TABLET | Freq: Once | ORAL | Status: AC
  Administered 2018-01-27: 0.1 mg via ORAL

## 2018-01-27 NOTE — Patient Outreach (Signed)
[facility]) Care Management  01/27/2018  Kimberly Carlson [birth year] [id number]  Assessment-CSW completed third and final outreach attempt today after waiting 10 business days after mailing outreach barrier letter to patient. CSW unable to reach patient successfully. CSW left a HIPPA compliant voice message encouraging patient to return call once available if interested in [place].   Plan-CSW will complete case closure and this time and notify [place] Management Assistant.  [name], BSW, MSW, [email or web address] Phone: [phone] Fax: [phone]

## 2018-01-27 NOTE — Progress Notes (Signed)
Subjective:    Kimberly Carlson is a 72 y.o. old female here for follow-up on blood pressure  HPI Hypertension: reports taking all her blood pressure medications including carvedilol, HCTZ and Hydralazine this morning.  Her hydralazine was increased from 10 mg to 25 mg 3 times daily about 5 days ago.  However, patient is still taking the 10 mg.  She didn't bring her medication bottles to this visit. She reports missing her Coreg and Hydralazine occasionally. She checks her blood pressure at home.  She brought her blood pressure log to this visit.  SBP ranges from 135-194, usually in 106's. DBP ranges from 63-90, usually in 70's. She says she doesn't put salt in her meals but doesn't read the sodium content. She continues to take Advil for back pain.  She says Tylenol does not help with her pain much.  She can not exercise due to dyspnea. She says she runs out breath very easily. Denies chest pain today. Reports "headache as usual". Pain is over her left head. She rate her pain 7/10. Denies acute vision changes, numbness, tingling or weakness.   PMH/Problem List: has DM2 (diabetes mellitus, type 2) ([place]); HYPERCHOLESTEROLEMIA; Obesity; History of tobacco use; Essential hypertension; HERNIATED LUMBAR [place]; DEGENERATIVE DISC DISEASE; DIPLOPIA; Headache; Cocaine use; Hematuria, undiagnosed cause; Anemia; Cataract; Hypovitaminosis D; Healthcare maintenance; Hypertensive urgency; Accelerated hypertension; AKI (acute kidney injury) ([place]); and At risk for obstructive sleep apnea on their problem list.   has a past medical history of Back pain, Diabetes mellitus without complication ([place]), and Hypertension.  FH:  Family History  Adopted: Yes    SH Social History   Tobacco Use  . Smoking status: Former Smoker    Packs/day: 1.00    Types: Cigarettes    Start date: 04/26/2014    Last attempt to quit: 07/23/2015    Years since quitting: 2.5  . Smokeless tobacco: Never Used  . Tobacco comment: Started in her  52's with multiple quit attempts for about 1 year at a time.  Substance Use Topics  . Alcohol use: No    Alcohol/week: 0.0 oz  . Drug use: No    Review of Systems Review of systems negative except for pertinent positives and negatives in history of present illness above.     Objective:     Vitals:   01/27/18 1024 01/27/18 1049 01/27/18 1050 01/27/18 1051  BP: (!) 180/70 (!) 208/90 (!) 200/84 (!) 212/88  Pulse:      Temp:      TempSrc:      SpO2:      Weight:      Height:       Body mass index is 36.99 kg/m.  Physical Exam  GEN: appears well, no apparent distress. Head: normocephalic and atraumatic.  No tenderness to palpation over temporal areas. Eyes: PERRl, EOMI HEM: negative for cervical or periauricular lymphadenopathies CVS: RRR, nl s1 & s2, no murmurs, no edema RESP: no IWOB, good air movement bilaterally, CTAB GI: BS present & normal, soft, NTND SKIN: no apparent skin lesion NEURO: Awake, alert and oriented appropriately. Cranial nerves II-XII  intact, motor 5/5 in all muscle groups of UE and LE bilaterally, normal tone, light sensation intact in all dermatomes of upper and lower ext bilaterally, no pronator drift, normal gait    Assessment and Plan:  1. Essential hypertension: poorly controlled.  Likely a combination of poor compliance with her medication, possible sleep apnea, CKD, NSAID use and sedentary life.  She missed a phone call  from [facility] for a sleep study about a week ago.  She did not call back to schedule for a sleep study.  She also reports taking Advil for her back pain.  She says she cannot exercise due to dyspnea.  Her spirometry was within normal about a week ago.  She has history of diastolic CHF, and may be stable angina.  Gave her the phone number to [facility] to call back and schedule for her sleep study.  Strongly recommended against using NSAID.  I recommended using extra strength Tylenol every 6 hours.  Referral to cardiology for CAD workup.  Will resume  her enalapril 5 mg daily.  Recommended taking her new hydralazine prescription and continuing her Coreg and HCTZ.  She did not tolerate amlodipine in the past due to edema. - Basic metabolic panel - enalapril (VASOTEC) 5 MG tablet; Take 1 tablet (5 mg total) by mouth daily.  Dispense: 30 tablet; Refill: 0  2. Type 2 diabetes mellitus with stage 3 chronic kidney disease, with long-term current use of insulin (HCC) - Microalbumin/Creatinine Ratio, Urine  3. CKD (chronic kidney disease) stage 3, GFR 30-59 ml/min ([place]): Likely due to poorly controlled hypertension.  GFR 34 about a month ago.  She also have anemia of chronic disease with last hemoglobin of 9.7 and MCV of 90 about three weeks ago.  Iron panel at the same time also suggest this anemia of chronic disease. -Ambulatory referral to nephrology.  Return in about 2 days (around 01/29/2018) for Hypertension.  Mercy Riding, MD 01/27/18 Pager: [phone]  Precepted patient with Dr. [place] Kehr

## 2018-01-27 NOTE — Patient Instructions (Addendum)
It was great seeing you today! We have addressed the following issues today  Hypertension/blood pressure: Your blood pressure is 190/70.  Your goal blood pressure is less than 140/90.  We restarted you enalapril to 5 mg daily.  Continue taking your carvedilol, hydrochlorothiazide and hydralazine.  We strongly recommend not taking any pain medicines over-the-counter other than Tylenol.  it is very important that you take this medication religiously.  We also sent a referral to her doctors and kidney doctors.  Someone will get in touch with you about this referral since the next 2-3 weeks.  Please come back and see [place] in 2 days or sooner if you have concern.  Please bring all your medication bottles to that visit.  Sleep study: please call back the neurology office to reschedule your sleep study. Their phone number is [phone]   If we did any lab work today, and the results require attention, either me or my nurse will get in touch with you. If everything is normal, you will get a letter in mail and a message via . If you don't hear from [place] in two weeks, please give [place] a call. Otherwise, we look forward to seeing you again at your next visit. If you have any questions or concerns before then, please call the clinic at [phone].  Please bring all your medications to every doctors visit  Sign up for My Chart to have easy access to your labs results, and communication with your Primary care physician.    Please check-out at the front desk before leaving the clinic.   Portion Size   Choose healthier foods such as 100% whole grains, vegetables, fruits, beans, nut seeds, olive oil, most vegetable oils, fat-free dietary, wild game and fish.   Avoid sweet tea, other sweetened beverages, soda, fruit juice, cold cereal and milk and trans fat.   Eat at least 3 meals and 1-2 snacks per day.  Aim for no more than 5 hours between eating.  Eat breakfast within one hour of getting up.    Exercise  at least 150 minutes per week, including weight resistance exercises 3 or 4 times per week.   Try to lose at least 7-10% of your current body weight.   Limit your salt (Sodium) intake to less than 2 gm (2000 mg) a day if you have conditions such as  elevated blood pressure, heart failure...    You may also read about DASH and/or Mediterranean diet at the following web site if you have blood pressure or heart condition. IdentityList.se LACodes.co.za  Take Care,   Dr. Cyndia Skeeters

## 2018-01-28 LAB — BASIC METABOLIC PANEL
BUN/Creatinine Ratio: 25 (ref 12–28)
BUN: 42 mg/dL — ABNORMAL HIGH (ref 8–27)
CO2: 22 mmol/L (ref 20–29)
Calcium: 9.3 mg/dL (ref 8.7–10.3)
Chloride: 104 mmol/L (ref 96–106)
Creatinine, Ser: 1.65 mg/dL — ABNORMAL HIGH (ref 0.57–1.00)
GFR calc Af Amer: 35 mL/min/{1.73_m2} — ABNORMAL LOW (ref >59)
GFR calc non Af Amer: 30 mL/min/{1.73_m2} — ABNORMAL LOW (ref >59)
Glucose: 304 mg/dL — ABNORMAL HIGH (ref 65–99)
Potassium: 4.7 mmol/L (ref 3.5–5.2)
Sodium: 142 mmol/L (ref 134–144)

## 2018-01-29 ENCOUNTER — Encounter: Payer: Self-pay | Admitting: Internal Medicine

## 2018-01-29 ENCOUNTER — Ambulatory Visit (INDEPENDENT_AMBULATORY_CARE_PROVIDER_SITE_OTHER): Payer: Medicare Other | Admitting: Internal Medicine

## 2018-01-29 ENCOUNTER — Other Ambulatory Visit: Payer: Self-pay

## 2018-01-29 DIAGNOSIS — I10 Essential (primary) hypertension: Secondary | ICD-10-CM

## 2018-01-29 LAB — MICROALBUMIN / CREATININE URINE RATIO
Creatinine, Urine: 24.2 mg/dL
Microalb/Creat Ratio: 7982.6 mg/g creat — ABNORMAL HIGH (ref 0.0–30.0)
Microalbumin, Urine: 1931.8 ug/mL

## 2018-01-29 NOTE — Assessment & Plan Note (Signed)
Has been difficult to control lately, but improving. BP 158/70 today.  - Continue Enalapril 5mg  daily, HCTZ 25mg  daily, and Coreg 6.25mg  bid - Hydralazine has recently been increased from 10mg  tid to 25mg  tid and she is waiting to get this delivered in mail. Will hold off on any additional changes at this time until we see what her pressures do with the increased Hydralazine dose.  - Cardiology referral pending - Sleep study has been scheduled from April 4th - Follow-up in 1-2 weeks for BP check and repeat BMP after restarting Enalapril

## 2018-01-29 NOTE — Patient Instructions (Signed)
It was so nice to see you!  Let's keep your blood pressure medications the same for now. Please start taking the higher dose of the Hydralazine (25mg  three times a day) once it gets delivered to your house.  We will see you back in 2 weeks to recheck your blood pressure and some labs.  -Dr. Brett Albino

## 2018-01-29 NOTE — Progress Notes (Signed)
   [facility] Phone: [phone]  Subjective:  Kimberly Carlson is a 72 year old female presenting to clinic for follow-up of her HTN. She was seen in clinic on 01/27/18. Patient was noted to have a BP of 180/70. Enalapril 5mg  was restarted. BMP was checked and showed improved kidney function with a creatinine of 1.65. She returns today for follow-up. She has been taking Enalapril 5mg  daily, HCTZ 25mg  daily, Coreg 6.25mg  bid, and Hydralazine 10mg  tid. Hydralazine dose was recently increased to 25mg  tid, but she is waiting for the higher dose to be delivered in the mail. She has been checking her blood pressures at home and they have been in the 140s-160s/60s-70s. No chest pain, no dizziness, no vision changes, no shortness of breath, no lower extremity edema.  ROS: See HPI for pertinent positives and negatives  Past Medical History- HTN, T2DM, hx cataracts, HLD, obesity  Family history reviewed for today's visit. No changes.  Social history- patient is a former smoker, hx of cocaine use  Objective: BP (!) 158/70   Pulse 79   Temp 98.2 F (36.8 C) (Oral)   Wt 207 lb (93.9 kg)   LMP  (LMP Unknown)   SpO2 97%   BMI 36.67 kg/m  Gen: NAD, alert, cooperative with exam HEENT: NCAT, EOMI, MMM Neck: FROM, supple CV: RRR, no murmur Resp: Normal work of breathing Msk: No edema  Assessment/Plan: HTN: Has been difficult to control lately, but improving. BP 158/70 today.  - Continue Enalapril 5mg  daily, HCTZ 25mg  daily, and Coreg 6.25mg  bid - Hydralazine has recently been increased from 10mg  tid to 25mg  tid and she is waiting to get this delivered in mail. Will hold off on any additional changes at this time until we see what her pressures do with the increased Hydralazine dose.  - Cardiology referral pending - Sleep study has been scheduled from April 4th - Follow-up in 1-2 weeks for BP check and repeat BMP after restarting Enalapril   Hyman Bible, MD PGY-3

## 2018-01-29 NOTE — Progress Notes (Signed)
Subjective  Patient is presenting with the following illnesses     Chief Complaint noted Review of Symptoms - see HPI PMH - Smoking status noted.     Objective Vital Signs reviewed     Assessments/Plans  No problem-specific Assessment & Plan notes found for this encounter.   See after visit summary for details of patient instuctions 

## 2018-01-30 ENCOUNTER — Other Ambulatory Visit: Payer: Self-pay | Admitting: Internal Medicine

## 2018-01-30 DIAGNOSIS — I10 Essential (primary) hypertension: Secondary | ICD-10-CM

## 2018-01-31 ENCOUNTER — Encounter: Payer: Self-pay | Admitting: Internal Medicine

## 2018-02-05 ENCOUNTER — Encounter: Payer: Self-pay | Admitting: Internal Medicine

## 2018-02-05 ENCOUNTER — Other Ambulatory Visit: Payer: Self-pay | Admitting: Internal Medicine

## 2018-02-05 DIAGNOSIS — I10 Essential (primary) hypertension: Secondary | ICD-10-CM

## 2018-02-06 ENCOUNTER — Telehealth: Payer: Self-pay | Admitting: Internal Medicine

## 2018-02-06 ENCOUNTER — Other Ambulatory Visit: Payer: Self-pay | Admitting: Internal Medicine

## 2018-02-06 MED ORDER — ENALAPRIL MALEATE 5 MG PO TABS: 5.0000 mg | ORAL_TABLET | Freq: Every day | ORAL | 0 refills | Status: DC

## 2018-02-06 MED ORDER — POLYETHYLENE GLYCOL 3350 17 G PO PACK: 17.0000 g | PACK | Freq: Every day | ORAL | 0 refills | Status: DC | PRN

## 2018-02-06 MED ORDER — HYDROCHLOROTHIAZIDE 25 MG PO TABS: 25.0000 mg | ORAL_TABLET | Freq: Every day | ORAL | 0 refills | Status: DC

## 2018-02-06 NOTE — Telephone Encounter (Signed)
Called patient to discuss her cologuard results. Had to leave a voicemail. The cologuard showed that she had blood in her stool. I called to see if she would be okay with me ordering a colonoscopy for her, as she needs further evaluation.  Hyman Bible, MD PGY-3

## 2018-02-17 ENCOUNTER — Other Ambulatory Visit: Payer: Self-pay

## 2018-02-17 ENCOUNTER — Encounter: Payer: Self-pay | Admitting: Internal Medicine

## 2018-02-17 ENCOUNTER — Ambulatory Visit (INDEPENDENT_AMBULATORY_CARE_PROVIDER_SITE_OTHER): Payer: Medicare Other | Admitting: Internal Medicine

## 2018-02-17 VITALS — BP 172/78 | HR 91 | Temp 98.7°F | Ht 63.0 in | Wt 207.4 lb

## 2018-02-17 DIAGNOSIS — R809 Proteinuria, unspecified: Secondary | ICD-10-CM | POA: Insufficient documentation

## 2018-02-17 DIAGNOSIS — I10 Essential (primary) hypertension: Secondary | ICD-10-CM | POA: Diagnosis not present

## 2018-02-17 LAB — POCT UA - MICROSCOPIC ONLY

## 2018-02-17 LAB — POCT URINALYSIS DIP (MANUAL ENTRY)
Bilirubin, UA: NEGATIVE
Glucose, UA: 100 mg/dL — AB
Ketones, POC UA: NEGATIVE mg/dL
Nitrite, UA: NEGATIVE
Protein Ur, POC: >=300 mg/dL — AB
Spec Grav, UA: 1.015 (ref 1.010–1.025)
Urobilinogen, UA: 0.2 E.U./dL
pH, UA: 6.5 (ref 5.0–8.0)

## 2018-02-17 NOTE — Assessment & Plan Note (Signed)
Urine microalbumin checked 01/27/18 for diabetic screening. Microalbumin/creatinine ratio was 7,982. Enalapril restarted 2 weeks ago. - Check UA to look for proteinuria - Referral placed to nephrology due to patient's CKD 3b, proteinuria, and resistant HTN - Discussed with attending

## 2018-02-17 NOTE — Addendum Note (Signed)
Addended by: Hyman Bible D on: 02/17/2018 11:00 PM   Modules accepted: Orders

## 2018-02-17 NOTE — Assessment & Plan Note (Signed)
Has been difficult to control. BP still elevated at 172/78 today. - Increase Hydralazine from 25mg  tid to 50mg  tid - Recheck BMP today after restarting Enalapril 2 weeks ago - Sleep study scheduled for April 4th - Follow-up in 1-2 weeks in nursing clinic for BP check

## 2018-02-17 NOTE — Patient Instructions (Signed)
It was so nice to see you today!  Your blood pressure is still high today. Please increase your Hydralazine from 25mg  three times per day to 50mg  three times per day.  I have put in a referral to the kidney doctor.  We will see you back in 1-2 weeks in nursing clinic for a blood pressure check.  -Dr. Brett Albino

## 2018-02-17 NOTE — Progress Notes (Signed)
   [facility] Phone: [phone]  Subjective:  Kimberly Carlson is a 72 year old female presenting to clinic for follow-up of her blood pressure. She was seen in clinic on 01/29/18. Hydralazine was increased from 10mg  tid to 25mg  tid at that time. She has been checking her blood pressures 3 times per day at home. They have been in the 737T systolics. The highest BP she saw was 062 systolic. She denies any chest pain, dizziness, or shortness of breath. She endorses lower extremity edema that has worsened over the last few weeks.  ROS: See HPI for pertinent positives and negatives  Past Medical History- HTN, T2DM, hx cataracts, HLD, obesity  Family history reviewed for today's visit. No changes.  Social history- patient is a former smoker, hx of cocaine use but does not currently use.  Objective: BP (!) 172/78   Pulse 91   Temp 98.7 F (37.1 C) (Oral)   Ht 5\' 3"  (1.6 m)   Wt 207 lb 6.4 oz (94.1 kg)   LMP  (LMP Unknown)   SpO2 96%   BMI 36.74 kg/m  Gen: NAD, alert, cooperative with exam HEENT: NCAT, EOMI, MMM Neck: FROM, supple CV: RRR, no murmur Resp: CTABL, no wheezes, normal work of breathing Msk: 1+ pitting edema in the ankles and feet bilaterally  Assessment/Plan: Hypertension: Has been difficult to control. BP still elevated at 172/78 today. - Increase Hydralazine from 25mg  tid to 50mg  tid - Recheck BMP today after restarting Enalapril 2 weeks ago - Sleep study scheduled for April 4th - Follow-up in 1-2 weeks in nursing clinic for BP check  Proteinuria: Urine microalbumin checked 01/27/18 for diabetic screening. Microalbumin/creatinine ratio was 7,982. Enalapril restarted 2 weeks ago. - Check UA to look for proteinuria - Referral placed to nephrology due to patient's CKD 3b, proteinuria, and resistant HTN - Discussed with attending   Hyman Bible, MD PGY-3

## 2018-02-18 LAB — BASIC METABOLIC PANEL
BUN/Creatinine Ratio: 20 (ref 12–28)
BUN: 40 mg/dL — ABNORMAL HIGH (ref 8–27)
CO2: 22 mmol/L (ref 20–29)
Calcium: 9 mg/dL (ref 8.7–10.3)
Chloride: 105 mmol/L (ref 96–106)
Creatinine, Ser: 1.98 mg/dL — ABNORMAL HIGH (ref 0.57–1.00)
GFR calc Af Amer: 28 mL/min/{1.73_m2} — ABNORMAL LOW (ref >59)
GFR calc non Af Amer: 24 mL/min/{1.73_m2} — ABNORMAL LOW (ref >59)
Glucose: 176 mg/dL — ABNORMAL HIGH (ref 65–99)
Potassium: 4 mmol/L (ref 3.5–5.2)
Sodium: 143 mmol/L (ref 134–144)

## 2018-02-20 ENCOUNTER — Telehealth: Payer: Self-pay | Admitting: Internal Medicine

## 2018-02-20 DIAGNOSIS — N183 Chronic kidney disease, stage 3 unspecified: Secondary | ICD-10-CM

## 2018-02-20 NOTE — Telephone Encounter (Signed)
Called patient to discuss her lab results. Had to leave a voicemail. Her kidneys got a little bit worse after restarting the Enalapril. She should continue taking it for now and we will recheck her kidneys when she returns to nursing clinic for a BP check on 03/03/18. Future order has been placed.  Her urine test showed that she is leaking proteins into her urine, which is a sign of kidney damage. She should make sure she goes to her appointment with the kidney doctor as soon as that is scheduled.  Hyman Bible, MD PGY-3

## 2018-02-27 ENCOUNTER — Encounter: Payer: Self-pay | Admitting: Neurology

## 2018-02-27 ENCOUNTER — Ambulatory Visit (INDEPENDENT_AMBULATORY_CARE_PROVIDER_SITE_OTHER): Payer: Medicare Other | Admitting: Neurology

## 2018-02-27 VITALS — BP 230/93 | HR 104 | Ht 63.0 in | Wt 209.0 lb

## 2018-02-27 DIAGNOSIS — G4719 Other hypersomnia: Secondary | ICD-10-CM

## 2018-02-27 DIAGNOSIS — R51 Headache: Secondary | ICD-10-CM | POA: Diagnosis not present

## 2018-02-27 DIAGNOSIS — R030 Elevated blood-pressure reading, without diagnosis of hypertension: Secondary | ICD-10-CM

## 2018-02-27 DIAGNOSIS — E669 Obesity, unspecified: Secondary | ICD-10-CM

## 2018-02-27 DIAGNOSIS — R351 Nocturia: Secondary | ICD-10-CM | POA: Diagnosis not present

## 2018-02-27 DIAGNOSIS — R519 Headache, unspecified: Secondary | ICD-10-CM

## 2018-02-27 DIAGNOSIS — R0683 Snoring: Secondary | ICD-10-CM | POA: Diagnosis not present

## 2018-02-27 NOTE — Patient Instructions (Addendum)
Thank you for choosing [facility] for your sleep related care! It was nice to meet you today! I appreciate that you entrust me with your sleep related healthcare concerns. I hope, I was able to address at least some of your concerns today, and that I can help you feel reassured and also get better.    Here is what we discussed today and what we came up with as our plan for you:    Based on your symptoms and your exam I believe you are at risk for obstructive sleep apnea or OSA, and I think we should proceed with a sleep study to determine whether you do or do not have OSA and how severe it is. If you have more than mild OSA, I want you to consider treatment with CPAP. Please remember, the risks and ramifications of moderate to severe obstructive sleep apnea or OSA are: Cardiovascular disease, including congestive heart failure, stroke, difficult to control hypertension, arrhythmias, and even type 2 diabetes has been linked to untreated OSA. Sleep apnea causes disruption of sleep and sleep deprivation in most cases, which, in turn, can cause recurrent headaches, problems with memory, mood, concentration, focus, and vigilance. Most people with untreated sleep apnea report excessive daytime sleepiness, which can affect their ability to drive. Please do not drive if you feel sleepy.   I will likely see you back after your sleep study to go over the test results and where to go from there. We will call you after your sleep study to advise about the results (most likely, you will hear from Vinita, my nurse) and to set up an appointment at the time, as necessary.    Our sleep lab administrative assistan will call you to schedule your sleep study. If you don't hear back from her by about 2 weeks from now, please feel free to call her at [phone]. You can leave a message with your phone number and concerns, if you get the voicemail box. She will call back as soon as possible.

## 2018-02-27 NOTE — Progress Notes (Signed)
Subjective:    Patient ID: Kimberly Carlson is a 72 y.o. female.  HPI     Star Age, MD, PhD [facility] [address] [place], [place] [address]  Dear Dr. Cyndia Skeeters,   I saw your patient, Kimberly Carlson, upon your kind request in my neurologic clinic today for initial consultation of her sleep disorder, in particular, concern for underlying obstructive sleep apnea. The patient is accompanied by her daughter today. As you know, Kimberly Carlson is a 72 year old right-handed woman with an underlying medical history of diabetes, hyperlipidemia, prior smoking, hypertension, degenerative lumbar spine disease, anemia, vitamin D deficiency, and obesity, who reports snoring and excessive daytime somnolence. I reviewed your office notes from 01/07/2018 as well as 01/28/2012. Her Epworth sleepiness score is 18 out of 24, fatigue score is 63 out of 63. She lives with her son and her daughter. She is retired. She quit smoking in 2016, does not utilize alcohol, denies illicit drug use, drinks caffeine in the form of coffee in AM, one cup per day on average. Occasional soda. Her bedtime is around midnight, rise time around 7 or 7:30 but she says that she typically goes back to bed around 9 AM. She sleeps throughout the day. She has nocturia about once per average night and has occasional morning headaches. She is not aware of any family history of OSA. She has had elevated blood pressure values, she takes her pressure medication regularly she reports, she has lower extremity swelling. She has blood sugar numbers around 170 she states. Last A1c in December 2018 was 8.2.  Her Past Medical History Is Significant For: Past Medical History:  Diagnosis Date  . Back pain    herniated disc lower back  . Diabetes mellitus without complication ([place])   . Hypertension     Her Past Surgical History Is Significant For: Past Surgical History:  Procedure Laterality Date  .  ABDOMINAL HYSTERECTOMY      Her Family History Is Significant For: Family History  Adopted: Yes    Her Social History Is Significant For: Social History   Socioeconomic History  . Marital status: Widowed    Spouse name: Not on file  . Number of children: Not on file  . Years of education: Not on file  . Highest education level: Not on file  Occupational History  . Not on file  Social Needs  . Financial resource strain: Not on file  . Food insecurity:    Worry: Not on file    Inability: Not on file  . Transportation needs:    Medical: Not on file    Non-medical: Not on file  Tobacco Use  . Smoking status: Former Smoker    Packs/day: 1.00    Types: Cigarettes    Start date: 04/26/2014    Last attempt to quit: 07/23/2015    Years since quitting: 2.6  . Smokeless tobacco: Never Used  . Tobacco comment: Started in her 65's with multiple quit attempts for about 1 year at a time.  Substance and Sexual Activity  . Alcohol use: No    Alcohol/week: 0.0 oz  . Drug use: No  . Sexual activity: Never    Birth control/protection: None  Lifestyle  . Physical activity:    Days per week: Not on file    Minutes per session: Not on file  . Stress: Not on file  Relationships  . Social connections:    Talks on phone: Not on file  Gets together: Not on file    Attends religious service: Not on file    Active member of club or organization: Not on file    Attends meetings of clubs or organizations: Not on file    Relationship status: Not on file  Other Topics Concern  . Not on file  Social History Narrative  . Not on file    Her Allergies Are:  Allergies  Allergen Reactions  . Amlodipine Swelling    Bilateral leg swelling with multiple doses including 2.5mg  daily.  Stopped therapy and edema improved.   . Augmentin [Amoxicillin-Pot Clavulanate] Diarrhea  . Clonidine Derivatives Other (See Comments)    Light-headedness, dizzy, extreme dry mouth  . Doxycycline Nausea And  Vomiting  . Metformin Diarrhea  :   Her Current Medications Are:  Outpatient Encounter Medications as of 02/27/2018  Medication Sig  . acetaminophen (TYLENOL) 500 MG tablet Take 1,000 mg by mouth every 6 (six) hours as needed (for headaches).  Marland Kitchen aspirin (ASPIRIN CHILDRENS) 81 MG chewable tablet Chew 81 mg by mouth daily.    Marland Kitchen atorvastatin (LIPITOR) 40 MG tablet Take 1 tablet (40 mg total) by mouth daily at 6 PM.  . calcium-vitamin D (OSCAL 500/200 D-3) 500-200 MG-UNIT tablet Take 2 tablets by mouth daily with breakfast.  . carvedilol (COREG) 6.25 MG tablet Take 1 tablet (6.25 mg total) by mouth 2 (two) times daily.  . enalapril (VASOTEC) 5 MG tablet Take 1 tablet (5 mg total) by mouth daily.  . enalapril (VASOTEC) 5 MG tablet Take 1 tablet (5 mg total) by mouth daily.  Marland Kitchen glucose blood (ONE TOUCH ULTRA TEST) test strip Check sugar 6 x daily  . hydrALAZINE (APRESOLINE) 25 MG tablet Take 1 tablet (25 mg total) by mouth 3 (three) times daily.  . hydrochlorothiazide (HYDRODIURIL) 25 MG tablet Take 1 tablet (25 mg total) by mouth daily.  . hydrochlorothiazide (HYDRODIURIL) 25 MG tablet Take 1 tablet (25 mg total) by mouth daily.  . insulin glargine (LANTUS) 100 UNIT/ML injection INJECT SUBCUTANEOUSLY 28  UNITS ONCE DAILY  . Insulin Syringe-Needle U-100 (INSULIN SYRINGE 1CC/31GX5/16") 31G X 5/16" 1 ML MISC 1 each See admin instructions by Does not apply route. Use daily with insulin.  . ONE TOUCH LANCETS MISC 1 each by Does not apply route See admin instructions. Check blood sugar once daily.  . polyethylene glycol (MIRALAX / GLYCOLAX) packet Take 17 g by mouth daily as needed for mild constipation.   No facility-administered encounter medications on file as of 02/27/2018.   :  Review of Systems:  Out of a complete 14 point review of systems, all are reviewed and negative with the exception of these symptoms as listed below:   Review of Systems  Neurological:       Pt presents today to discuss  her sleep. Pt has never had a sleep study but does endorse snoring.  Epworth Sleepiness Scale 0= would never doze 1= slight chance of dozing 2= moderate chance of dozing 3= high chance of dozing  Sitting and reading: 3 Watching TV: 3 Sitting inactive in a public place (ex. Theater or meeting): 2 As a passenger in a car for an hour without a break: 2 Lying down to rest in the afternoon: 3 Sitting and talking to someone: 2 Sitting quietly after lunch (no alcohol): 3 In a car, while stopped in traffic: 0 Total: 18     Objective:  Neurological Exam  Physical Exam Physical Examination:   Vitals:  02/27/18 1526  BP: (!) 230/93  Pulse: (!) 104   General Examination: The patient is a very pleasant 72 y.o. female in no acute distress. She appears well-developed and well-nourished and well groomed. She has no chest pain or headache or shortness of breath currently. Upon recheck at the end of our visit her blood pressure was 200/78.   HEENT: Normocephalic, atraumatic, pupils are equal, round and reactive to light and accommodation. Extraocular tracking is good without limitation to gaze excursion or nystagmus noted. Normal smooth pursuit is noted. Hearing is grossly intact. Face is symmetric with normal facial animation and normal facial sensation. Speech is clear with no dysarthria noted. There is no hypophonia. There is no lip, neck/head, jaw or voice tremor. Neck is supple with full range of passive and active motion. There are no carotid bruits on auscultation. Oropharynx exam reveals: mild mouth dryness, edentulous state and mild airway crowding, due to smaller airway entry redundant soft palate, thicker tongue, tonsils are small, Mallampati is class II. Neck circumference is 16-3/4 inches.   Chest: Clear to auscultation without wheezing, rhonchi or crackles noted.  Heart: S1+S2+0, regular and normal without murmurs, rubs or gallops noted.   Abdomen: Soft, non-tender and  non-distended with normal bowel sounds appreciated on auscultation.  Extremities: There is 2+ pitting edema in the distal lower extremities bilaterally.   Skin: Warm and dry without trophic changes noted.  Musculoskeletal: exam reveals no obvious joint deformities, tenderness or joint swelling or erythema.   Neurologically:  Mental status: The patient is awake, alert and oriented in all 4 spheres. Her immediate and remote memory, attention, language skills and fund of knowledge are appropriate. There is no evidence of aphasia, agnosia, apraxia or anomia. Speech is clear with normal prosody and enunciation. Thought process is linear. Mood is normal and affect is normal.  Cranial nerves II - XII are as described above under HEENT exam. In addition: shoulder shrug is normal with equal shoulder height noted. Motor exam: Normal bulk, strength and tone is noted. There is no tremor or rebound. Fine motor skills and coordination: intact with normal finger taps, normal hand movements, normal rapid alternating patting, normal foot taps and normal foot agility.  Cerebellar testing: No dysmetria or intention tremor. There is no truncal or gait ataxia.  Sensory exam: intact to light touch in the upper and lower extremities.  Gait, station and balance: She stands without difficulty. No veering to one side is noted. No leaning to one side is noted. Posture is age-appropriate and stance is narrow based. Gait shows normal stride length and normal pace. No problems turning are noted.   Assessment and Plan:  In summary, MISHELLE HASSAN is a very pleasant 72 y.o.-year old female with an underlying medical history of diabetes, hyperlipidemia, prior smoking, hypertension, degenerative lumbar spine disease, anemia, vitamin D deficiency, and obesity, whose history and physical exam are concerning for obstructive sleep apnea (OSA).for her significantly elevated blood pressure values today she is strongly advised to get  in touch with your office. She has no symptom of chest pain, blurry vision, headache or shortness of breath. I had a long chat with the patient and her daughter about my findings and the diagnosis of OSA, its prognosis and treatment options. We talked about medical treatments, surgical interventions and non-pharmacological approaches. I explained in particular the risks and ramifications of untreated moderate to severe OSA, especially with respect to developing cardiovascular disease down the Road, including congestive heart failure, difficult to treat  hypertension, cardiac arrhythmias, or stroke. Even type 2 diabetes has, in part, been linked to untreated OSA. Symptoms of untreated OSA include daytime sleepiness, memory problems, mood irritability and mood disorder such as depression and anxiety, lack of energy, as well as recurrent headaches, especially morning headaches. We talked about trying to maintain a healthy lifestyle in general, as well as the importance of weight control. I encouraged the patient to eat healthy, exercise daily and keep well hydrated, to keep a scheduled bedtime and wake time routine, to not skip any meals and eat healthy snacks in between meals. I advised the patient not to drive when feeling sleepy. I recommended the following at this time: sleep study with potential positive airway pressure titration. (We will score hypopneas at 4% and split the sleep study into diagnostic and treatment portion, if the estimated. 2 hour AHI is >20/h).   I explained the sleep test procedure to the patient and also outlined possible surgical and non-surgical treatment options of OSA, including the use of a custom-made dental device (which would require a referral to a specialist dentist or oral surgeon), upper airway surgical options, such as pillar implants, radiofrequency surgery, tongue base surgery, and UPPP (which would involve a referral to an ENT surgeon). Rarely, jaw surgery such as  mandibular advancement may be considered.  I also explained the CPAP treatment option to the patient, who indicated that she would be willing to try CPAP if the need arises. I explained the importance of being compliant with PAP treatment, not only for insurance purposes but primarily to improve Her symptoms, and for the patient's long term health benefit, including to reduce Her cardiovascular risks. I answered all their questions today and the patient and her daughter were in agreement. I would like to see her back after the sleep study is completed and encouraged her to call with any interim questions, concerns, problems or updates.   Thank you very much for allowing me to participate in the care of this nice patient. If I can be of any further assistance to you please do not hesitate to call me at [phone].  Sincerely,   Star Age, MD, PhD

## 2018-03-03 ENCOUNTER — Other Ambulatory Visit: Payer: Self-pay

## 2018-03-03 ENCOUNTER — Ambulatory Visit: Payer: Medicare Other

## 2018-03-03 ENCOUNTER — Other Ambulatory Visit: Payer: Self-pay | Admitting: Student

## 2018-03-03 VITALS — BP 180/98 | HR 75

## 2018-03-03 DIAGNOSIS — N183 Chronic kidney disease, stage 3 unspecified: Secondary | ICD-10-CM

## 2018-03-03 DIAGNOSIS — I10 Essential (primary) hypertension: Secondary | ICD-10-CM

## 2018-03-03 NOTE — Progress Notes (Signed)
   Patient in to nurse clinic today for BP check.   BP today is 160/102 in right arm, pulse 78.  Recheck 10 mins later is 180/98 in left arm, pulse 75.  Symptoms present: shortness of breath, ongoing for years per patient.  Patient last took HCTZ, Enalapril and Carvediolol 0900  today.   Patient has not heard from nephrology office yet regarding appt. Spoke with Kennyth Lose and it could be 3-4 months before she is seen.   Patient escorted to lab for pending lab work. I informed her of lab results per PCP's note.   Routed note to PCP.    Esau Grew, RN

## 2018-03-04 LAB — BASIC METABOLIC PANEL
BUN/Creatinine Ratio: 23 (ref 12–28)
BUN: 46 mg/dL — ABNORMAL HIGH (ref 8–27)
CO2: 22 mmol/L (ref 20–29)
Calcium: 9.6 mg/dL (ref 8.7–10.3)
Chloride: 104 mmol/L (ref 96–106)
Creatinine, Ser: 2 mg/dL — ABNORMAL HIGH (ref 0.57–1.00)
GFR calc Af Amer: 28 mL/min/{1.73_m2} — ABNORMAL LOW (ref >59)
GFR calc non Af Amer: 24 mL/min/{1.73_m2} — ABNORMAL LOW (ref >59)
Glucose: 166 mg/dL — ABNORMAL HIGH (ref 65–99)
Potassium: 4.5 mmol/L (ref 3.5–5.2)
Sodium: 140 mmol/L (ref 134–144)

## 2018-03-04 MED ORDER — CARVEDILOL 6.25 MG PO TABS: 6.2500 mg | ORAL_TABLET | Freq: Two times a day (BID) | ORAL | 3 refills | Status: DC

## 2018-03-04 MED ORDER — HYDROCHLOROTHIAZIDE 25 MG PO TABS: 25.0000 mg | ORAL_TABLET | Freq: Every day | ORAL | 3 refills | Status: DC

## 2018-03-07 ENCOUNTER — Other Ambulatory Visit: Payer: Self-pay | Admitting: Internal Medicine

## 2018-03-07 DIAGNOSIS — I10 Essential (primary) hypertension: Secondary | ICD-10-CM

## 2018-03-11 ENCOUNTER — Encounter: Payer: Self-pay | Admitting: Internal Medicine

## 2018-03-12 ENCOUNTER — Other Ambulatory Visit: Payer: Self-pay | Admitting: Internal Medicine

## 2018-03-12 DIAGNOSIS — I10 Essential (primary) hypertension: Secondary | ICD-10-CM

## 2018-03-12 MED ORDER — HYDROCHLOROTHIAZIDE 25 MG PO TABS: 25.0000 mg | ORAL_TABLET | Freq: Every day | ORAL | 3 refills | Status: DC

## 2018-03-30 ENCOUNTER — Ambulatory Visit (INDEPENDENT_AMBULATORY_CARE_PROVIDER_SITE_OTHER): Payer: Medicare Other | Admitting: Neurology

## 2018-03-30 DIAGNOSIS — R51 Headache: Secondary | ICD-10-CM

## 2018-03-30 DIAGNOSIS — G4710 Hypersomnia, unspecified: Secondary | ICD-10-CM

## 2018-03-30 DIAGNOSIS — R0683 Snoring: Secondary | ICD-10-CM

## 2018-03-30 DIAGNOSIS — R351 Nocturia: Secondary | ICD-10-CM

## 2018-03-30 DIAGNOSIS — E669 Obesity, unspecified: Secondary | ICD-10-CM

## 2018-03-30 DIAGNOSIS — R519 Headache, unspecified: Secondary | ICD-10-CM

## 2018-03-30 DIAGNOSIS — G4719 Other hypersomnia: Secondary | ICD-10-CM

## 2018-03-30 DIAGNOSIS — G4720 Circadian rhythm sleep disorder, unspecified type: Secondary | ICD-10-CM

## 2018-03-30 DIAGNOSIS — R030 Elevated blood-pressure reading, without diagnosis of hypertension: Secondary | ICD-10-CM

## 2018-04-01 ENCOUNTER — Telehealth: Payer: Self-pay

## 2018-04-01 NOTE — Telephone Encounter (Signed)
I called pt to discuss her sleep study results. No answer, left a message asking her to call me back. 

## 2018-04-01 NOTE — Procedures (Signed)
PATIENT'S NAME:  Kimberly Carlson, Kimberly Carlson DOB:      1946/09/23      MR#:    [id number]     DATE OF RECORDING: 03/30/2018 REFERRING M.D.:  Dr. Wendee Beavers Study Performed: Baseline Polysomnogram HISTORY: 72 year old woman with a history of diabetes, hyperlipidemia, prior smoking, hypertension, degenerative lumbar spine disease, anemia, vitamin D deficiency, and obesity, who reports snoring and daytime somnolence. The patient endorsed the Epworth Sleepiness Scale at 18/24 points. The patient's weight 209 pounds with a height of 63 (inches), resulting in a BMI of 37.1 kg/m2. The patient's neck circumference measured 16.8 inches.  CURRENT MEDICATIONS: Tylenol, Aspirin, Lipitor, Oscal, Coreg, Vasotec, Apresoline, Hydrodiuril, Lantus, Miralax.   PROCEDURE:  This is a multichannel digital polysomnogram utilizing the Somnostar 11.2 system.  Electrodes and sensors were applied and monitored per AASM Specifications.   EEG, EOG, Chin and Limb EMG, were sampled at 200 Hz.  ECG, Snore and Nasal Pressure, Thermal Airflow, Respiratory Effort, CPAP Flow and Pressure, Oximetry was sampled at 50 Hz. Digital video and audio were recorded.      BASELINE STUDY  Lights Out was at 21:11 and Lights On at 05:03.  Total recording time (TRT) was 473 minutes, with a total sleep time (TST) of 241 minutes. The patient's sleep latency was 158 minutes, which is markedly delayed. REM latency was 74 minutes. The sleep efficiency was 51%, which is reduced.     SLEEP ARCHITECTURE: WASO (Wake after sleep onset) was 73.5 minutes with one long period of wakefulness. There were 3 minutes in Stage N1, 97.5 minutes Stage N2, 80.5 minutes Stage N3 and 60 minutes in Stage REM.  The percentage of Stage N1 was 1.2%, Stage N2 was 40.5%, Stage N3 was 33.4%, which is increased, and Stage R (REM sleep) was 24.9%, which is normal. The arousals were noted as: 3 were spontaneous, 0 were associated with PLMs, 0 were associated with respiratory  events.  RESPIRATORY ANALYSIS:  There were a total of 7 respiratory events:  0 obstructive apneas, 0 central apneas and 0 mixed apneas with a total of 0 apneas and an apnea index (AI) of 0 /hour. There were 7 hypopneas with a hypopnea index of 1.7 /hour. The patient also had 0 respiratory event related arousals (RERAs).      The total APNEA/HYPOPNEA INDEX (AHI) was 1.7/hour and the total RESPIRATORY DISTURBANCE INDEX was 0. 1.7 /hour.  6 events occurred in REM sleep and 2 events in NREM. The REM AHI was 6/hour, versus a non-REM AHI of .3. The patient spent 201.5 minutes of total sleep time in the supine position and 40 minutes in non-supine. The supine AHI was 2.1 versus a non-supine AHI of 0.0.  OXYGEN SATURATION & C02:  The Wake baseline 02 saturation was 94%, with the lowest being 82%. Time spent below 89% saturation equaled 9 minutes.  PERIODIC LIMB MOVEMENTS: The patient had a total of 5 Periodic Limb Movements.  The Periodic Limb Movement (PLM) index was 1.2 and the PLM Arousal index was 0/hour.  Audio and video analysis did not show any abnormal or unusual movements, behaviors, phonations or vocalizations. The patient took 2 bathroom breaks. No significant snoring was noted. The EKG was in keeping with normal sinus rhythm (NSR).   Post-study, the patient indicated that sleep was worse than usual.   IMPRESSION:  1. Dysfunctions associated with sleep stages or arousal from sleep  RECOMMENDATIONS:  1. This study does not demonstrate any significant obstructive or central sleep disordered breathing  with the exception of borderline sleep apnea when in supine REM sleep. Weight loss and avoiding the supine sleep position will likely suffice as treatment. This study does not support an intrinsic sleep disorder as a cause of the patient's symptoms. Other causes, including circadian rhythm disturbances, an underlying mood disorder, medication effect and/or an underlying medical problem cannot be  ruled out. 2. This study shows sleep fragmentation and abnormal sleep stage percentages; these are nonspecific findings and per se do not signify an intrinsic sleep disorder or a cause for the patient's sleep-related symptoms. Causes include (but are not limited to) the first night effect of the sleep study, circadian rhythm disturbances, medication effect or an underlying mood disorder or medical problem.  3. The patient should be cautioned not to drive, work at heights, or operate dangerous or heavy equipment when tired or sleepy. Review and reiteration of good sleep hygiene measures should be pursued with any patient. 4. The patient will be advised to follow-up with her referring provider, who will be notified of the test results.  I certify that I have reviewed the entire raw data recording prior to the issuance of this report in accordance with the Standards of Accreditation of the American Academy of Sleep Medicine (AASM)   Star Age, MD, PhD Diplomat, American Board of Psychiatry and Neurology (Neurology and Sleep Medicine)

## 2018-04-01 NOTE — Telephone Encounter (Signed)
Pt returned my call. I advised pt that Dr. Rexene Alberts reviewed pt's sleep study and found that pt did not show any significant osa with the exception of borderline sleep apnea when in supine REM sleep. I advised pt that weight loss and avoidance of supine sleep will suffice for treatment. No underlying organic sleep disorder was found. Dr. Rexene Alberts recommends that pt follow up with PCP/referring provider. I reviewed sleep hygiene recommendations with the pt, including trying to keep a regular sleep wake schedule, avoiding electronics in the bedroom, keeping the bedroom cool, dark, and quiet, and avoiding eating or exercising within 2 hours of bedtime as well as eating in the middle of the night. I advised pt to keep pets out of the bedroom. I discussed with pt the importance of stress relief and to try meditation, deep breathing exercises, and/or a white noise machine or fan to diffuse other noise distractors. I advised pt to not drink alcohol before bedtime and to never mix alcohol and sedating medications. Pt was advised to avoid narcotic pain medication close to bedtime. I advised pt that a copy of these sleep study results will be sent to Dr. Cyndia Skeeters. Pt verbalized understanding of results. Pt had no questions at this time but was encouraged to call back if questions arise.

## 2018-04-01 NOTE — Progress Notes (Signed)
Patient referred by Dr. Cyndia Skeeters, seen by me on 02/27/18, diagnostic PSG on 03/30/18.   Please call and notify the patient that the recent sleep study did not show any significant obstructive sleep apnea with the exception of borderline sleep apnea when in supine REM sleep. Weight loss and avoiding the supine sleep position will likely suffice as treatment. She did not sleep very well. No underlying organic sleep disorder found.  Please inform patient that she can FU with PCP/referring provider.  Please remind patient to try to maintain good sleep hygiene, which means: Keep a regular sleep and wake schedule and make enough time for sleep (7 1/2 to 8 1/2 hours for the average adult), try not to exercise or have a meal within 2 hours of your bedtime, try to keep your bedroom conducive for sleep, that is, cool and dark, without light distractors such as an illuminated alarm clock, and refrain from watching TV right before sleep or in the middle of the night and do not keep the TV or radio on during the night. If a nightlight is used, have it away from the visual field. Also, try not to use or play on electronic devices at bedtime, such as your cell phone, tablet PC or laptop. If you like to read at bedtime on an electronic device, try to dim the background light as much as possible. Do not eat in the middle of the night. Keep pets away from the bedroom environment. For stress relief, try meditation, deep breathing exercises (there are many books and CDs available), a white noise machine or fan can help to diffuse other noise distractors, such as traffic noise. Do not drink alcohol before bedtime, as it can disturb sleep and cause middle of the night awakenings. Never mix alcohol and sedating medications! Avoid narcotic pain medication close to bedtime, as opioids/narcotics can suppress breathing drive and breathing effort.     Star Age, MD, PhD [facility])

## 2018-04-01 NOTE — Telephone Encounter (Signed)
-----   Message from Star Age, MD sent at 04/01/2018  7:44 AM EDT ----- Patient referred by Dr. Cyndia Skeeters, seen by me on 02/27/18, diagnostic PSG on 03/30/18.   Please call and notify the patient that the recent sleep study did not show any significant obstructive sleep apnea with the exception of borderline sleep apnea when in supine REM sleep. Weight loss and avoiding the supine sleep position will likely suffice as treatment. She did not sleep very well. No underlying organic sleep disorder found.  Please inform patient that she can FU with PCP/referring provider.  Please remind patient to try to maintain good sleep hygiene, which means: Keep a regular sleep and wake schedule and make enough time for sleep (7 1/2 to 8 1/2 hours for the average adult), try not to exercise or have a meal within 2 hours of your bedtime, try to keep your bedroom conducive for sleep, that is, cool and dark, without light distractors such as an illuminated alarm clock, and refrain from watching TV right before sleep or in the middle of the night and do not keep the TV or radio on during the night. If a nightlight is used, have it away from the visual field. Also, try not to use or play on electronic devices at bedtime, such as your cell phone, tablet PC or laptop. If you like to read at bedtime on an electronic device, try to dim the background light as much as possible. Do not eat in the middle of the night. Keep pets away from the bedroom environment. For stress relief, try meditation, deep breathing exercises (there are many books and CDs available), a white noise machine or fan can help to diffuse other noise distractors, such as traffic noise. Do not drink alcohol before bedtime, as it can disturb sleep and cause middle of the night awakenings. Never mix alcohol and sedating medications! Avoid narcotic pain medication close to bedtime, as opioids/narcotics can suppress breathing drive and breathing effort.     Star Age, MD,  PhD [facility])

## 2018-04-02 ENCOUNTER — Emergency Department (HOSPITAL_COMMUNITY): Payer: Medicare Other

## 2018-04-02 ENCOUNTER — Encounter (HOSPITAL_COMMUNITY): Payer: Self-pay

## 2018-04-02 ENCOUNTER — Emergency Department (HOSPITAL_COMMUNITY)
Admission: EM | Admit: 2018-04-02 | Discharge: 2018-04-03 | Disposition: A | Discharge status: Home / Self Care | Payer: Medicare Other | Attending: Emergency Medicine | Admitting: Emergency Medicine

## 2018-04-02 ENCOUNTER — Other Ambulatory Visit: Payer: Self-pay

## 2018-04-02 DIAGNOSIS — Z87891 Personal history of nicotine dependence: Secondary | ICD-10-CM | POA: Insufficient documentation

## 2018-04-02 DIAGNOSIS — R609 Edema, unspecified: Secondary | ICD-10-CM | POA: Diagnosis not present

## 2018-04-02 DIAGNOSIS — Z794 Long term (current) use of insulin: Secondary | ICD-10-CM | POA: Diagnosis not present

## 2018-04-02 DIAGNOSIS — E119 Type 2 diabetes mellitus without complications: Secondary | ICD-10-CM | POA: Diagnosis not present

## 2018-04-02 DIAGNOSIS — D649 Anemia, unspecified: Secondary | ICD-10-CM

## 2018-04-02 DIAGNOSIS — N189 Chronic kidney disease, unspecified: Secondary | ICD-10-CM | POA: Diagnosis not present

## 2018-04-02 DIAGNOSIS — Z7982 Long term (current) use of aspirin: Secondary | ICD-10-CM | POA: Diagnosis not present

## 2018-04-02 DIAGNOSIS — Z79899 Other long term (current) drug therapy: Secondary | ICD-10-CM | POA: Insufficient documentation

## 2018-04-02 DIAGNOSIS — R079 Chest pain, unspecified: Secondary | ICD-10-CM | POA: Diagnosis not present

## 2018-04-02 DIAGNOSIS — R0602 Shortness of breath: Secondary | ICD-10-CM | POA: Diagnosis not present

## 2018-04-02 DIAGNOSIS — R600 Localized edema: Secondary | ICD-10-CM | POA: Diagnosis not present

## 2018-04-02 DIAGNOSIS — N289 Disorder of kidney and ureter, unspecified: Secondary | ICD-10-CM | POA: Diagnosis not present

## 2018-04-02 DIAGNOSIS — I129 Hypertensive chronic kidney disease with stage 1 through stage 4 chronic kidney disease, or unspecified chronic kidney disease: Secondary | ICD-10-CM | POA: Insufficient documentation

## 2018-04-02 LAB — BASIC METABOLIC PANEL
Anion gap: 10 (ref 5–15)
BUN: 29 mg/dL — ABNORMAL HIGH (ref 6–20)
CO2: 25 mmol/L (ref 22–32)
Calcium: 9.2 mg/dL (ref 8.9–10.3)
Chloride: 107 mmol/L (ref 101–111)
Creatinine, Ser: 2 mg/dL — ABNORMAL HIGH (ref 0.44–1.00)
GFR calc Af Amer: 27 mL/min — ABNORMAL LOW (ref >60)
GFR calc non Af Amer: 23 mL/min — ABNORMAL LOW (ref >60)
Glucose, Bld: 140 mg/dL — ABNORMAL HIGH (ref 65–99)
Potassium: 4.1 mmol/L (ref 3.5–5.1)
Sodium: 142 mmol/L (ref 135–145)

## 2018-04-02 LAB — CBC
HCT: 29 % — ABNORMAL LOW (ref 36.0–46.0)
Hemoglobin: 9.3 g/dL — ABNORMAL LOW (ref 12.0–15.0)
MCH: 29.3 pg (ref 26.0–34.0)
MCHC: 32.1 g/dL (ref 30.0–36.0)
MCV: 91.5 fL (ref 78.0–100.0)
Platelets: 197 10*3/uL (ref 150–400)
RBC: 3.17 MIL/uL — ABNORMAL LOW (ref 3.87–5.11)
RDW: 12.4 % (ref 11.5–15.5)
WBC: 9.8 10*3/uL (ref 4.0–10.5)

## 2018-04-02 LAB — I-STAT TROPONIN, ED: Troponin i, poc: 0.01 ng/mL (ref 0.00–0.08)

## 2018-04-02 MED ORDER — FUROSEMIDE 10 MG/ML IJ SOLN
40.0000 mg | Freq: Once | INTRAMUSCULAR | Status: AC
Start: 2018-04-02 — End: 2018-04-03
  Administered 2018-04-03: 40 mg via INTRAVENOUS
  Filled 2018-04-02: qty 4

## 2018-04-02 MED ORDER — ASPIRIN 81 MG PO CHEW
324.0000 mg | CHEWABLE_TABLET | Freq: Once | ORAL | Status: AC
  Administered 2018-04-02: 324 mg via ORAL
  Filled 2018-04-02: qty 4

## 2018-04-02 NOTE — ED Notes (Signed)
Pt. A;ert with family @ bedside

## 2018-04-02 NOTE — ED Triage Notes (Addendum)
Pt endorses shob for a while, worse today while walking in the store accompanied by intermittent CP. No radiation. Took BP earlier and it was 198/78, hypertensive in triage 225/89. Speaking in complete sentences.

## 2018-04-02 NOTE — ED Notes (Signed)
Pt. Alert,

## 2018-04-02 NOTE — ED Provider Notes (Signed)
[place] EMERGENCY DEPARTMENT Provider Note   CSN: [id number] Arrival date & time: 04/02/18  1631     History   Chief Complaint Chief Complaint  Patient presents with  . Shortness of Breath  . Chest Pain    HPI Kimberly Carlson is a 72 y.o. female.  The history is provided by the patient.  She has history of hypertension, diabetes, hyperlipidemia and comes in because of dyspnea and chest discomfort.  She has been having exertional dyspnea for about a year, but it is getting worse.  She now gets short of breath on walking about 20 feet.  She got very dyspneic today while in a store, and her daughter convinced her to come to the hospital.  When she gets episodes of dyspnea, she also gets a tight feeling in her chest which will last as long as 10-15 minutes before resolving.  She states that she has talked with her PCP about this, but has not had any treatment that she is aware of.  She does check her blood pressure at home and states that it generally been doing reasonably well with no readings over 165.  Today, blood pressure was noted to be 198/78.  Also, she has noted that her legs are more swollen than they normally are.  She is supposed to be taking hydrochlorothiazide once a day, but there has been some mixup at the pharmacy and she has not been able to get the prescription refilled and has not been taking it for approximately the last month.  She has had some mild nausea associated with the dyspneic episodes but no vomiting.  She denies diaphoresis.  Also, she did have a sleep study done this past week.  Past Medical History:  Diagnosis Date  . Back pain    herniated disc lower back  . Diabetes mellitus without complication ([place])   . Hypertension     Patient Active Problem List   Diagnosis Date Noted  . Proteinuria 02/17/2018  . At risk for obstructive sleep apnea 01/07/2018  . Healthcare maintenance 11/13/2017  . Hypovitaminosis D 02/28/2016  . Anemia  08/10/2015  . Hematuria, undiagnosed cause 06/09/2015  . Cocaine use 06/08/2015  . DEGENERATIVE DISC DISEASE 11/09/2009  . History of tobacco use 09/03/2009  . Obesity 07/08/2009  . HERNIATED LUMBAR DISC 05/14/2007  . DM2 (diabetes mellitus, type 2) ([place]) 01/23/2007  . HYPERCHOLESTEROLEMIA 01/23/2007  . Resistant hypertension 01/23/2007    Past Surgical History:  Procedure Laterality Date  . ABDOMINAL HYSTERECTOMY       OB History   None      Home Medications    Prior to Admission medications   Medication Sig Start Date End Date Taking? Authorizing Provider  acetaminophen (TYLENOL) 500 MG tablet Take 1,000 mg by mouth every 6 (six) hours as needed (for headaches).    [provider]  aspirin (ASPIRIN CHILDRENS) 81 MG chewable tablet Chew 81 mg by mouth daily.      [provider]  atorvastatin (LIPITOR) 40 MG tablet Take 1 tablet (40 mg total) by mouth daily at 6 PM. 02/13/17   Mayo, Pete Pelt, MD  calcium-vitamin D (OSCAL 500/200 D-3) 500-200 MG-UNIT tablet Take 2 tablets by mouth daily with breakfast. 02/27/16   Olam Idler, MD  carvedilol (COREG) 6.25 MG tablet Take 1 tablet (6.25 mg total) by mouth 2 (two) times daily. 03/04/18 03/04/19  Mayo, Pete Pelt, MD  enalapril (VASOTEC) 5 MG tablet Take 1 tablet (5  mg total) by mouth daily. 02/06/18   Mayo, Pete Pelt, MD  enalapril (VASOTEC) 5 MG tablet TAKE 1 TABLET BY MOUTH DAILY 03/03/18   Mayo, Pete Pelt, MD  glucose blood (ONE TOUCH ULTRA TEST) test strip Check sugar 6 x daily 10/02/16   Mayo, Pete Pelt, MD  hydrALAZINE (APRESOLINE) 25 MG tablet Take 1 tablet (25 mg total) by mouth 3 (three) times daily. 01/23/18   Zenia Resides, MD  hydrochlorothiazide (HYDRODIURIL) 25 MG tablet TAKE 1 TABLET BY MOUTH DAILY 03/07/18   Mayo, Pete Pelt, MD  hydrochlorothiazide (HYDRODIURIL) 25 MG tablet Take 1 tablet (25 mg total) by mouth daily. 03/12/18   Mayo, Pete Pelt, MD  insulin glargine (LANTUS) 100 UNIT/ML injection INJECT  SUBCUTANEOUSLY 28  UNITS ONCE DAILY 10/11/17   Mayo, Pete Pelt, MD  Insulin Syringe-Needle U-100 (INSULIN SYRINGE 1CC/31GX5/16") 31G X 5/16" 1 ML MISC 1 each See admin instructions by Does not apply route. Use daily with insulin. 10/11/17   Mayo, Pete Pelt, MD  ONE TOUCH LANCETS MISC 1 each by Does not apply route See admin instructions. Check blood sugar once daily. 02/21/15   Olam Idler, MD  polyethylene glycol [facility] / Floria Raveling) packet Take 17 g by mouth daily as needed for mild constipation. 02/06/18   Mayo, Pete Pelt, MD    Family History Family History  Adopted: Yes    Social History Social History   Tobacco Use  . Smoking status: Former Smoker    Packs/day: 1.00    Types: Cigarettes    Start date: 04/26/2014    Last attempt to quit: 07/23/2015    Years since quitting: 2.6  . Smokeless tobacco: Never Used  . Tobacco comment: Started in her 64's with multiple quit attempts for about 1 year at a time.  Substance Use Topics  . Alcohol use: No    Alcohol/week: 0.0 oz  . Drug use: No     Allergies   Amlodipine; Augmentin [amoxicillin-pot clavulanate]; Clonidine derivatives; Doxycycline; and Metformin   Review of Systems Review of Systems  All other systems reviewed and are negative.    Physical Exam Updated Vital Signs BP (!) 208/84   Pulse 82   Temp 98.3 F (36.8 C) (Oral)   Resp 20   Ht 5\' 3"  (1.6 m)   Wt 93.9 kg (207 lb)   LMP  (LMP Unknown)   SpO2 99%   BMI 36.67 kg/m   Physical Exam  Nursing note and vitals reviewed.  72 year old female, resting comfortably and in no acute distress. Vital signs are significant for hypertension. Oxygen saturation is 99%, which is normal. Head is normocephalic and atraumatic. PERRLA, EOMI. Oropharynx is clear. Neck is nontender and supple without adenopathy. JVD is present. Back is nontender and there is no CVA tenderness. Lungs are clear without rales, wheezes, or rhonchi. Chest is nontender. Heart has regular  rate and rhythm without murmur. Abdomen is soft, flat, nontender without masses or hepatosplenomegaly and peristalsis is normoactive. Extremities have 2-3+ edema, full range of motion is present.  2+ presacral edema is present. Skin is warm and dry without rash. Neurologic: Mental status is normal, cranial nerves are intact, there are no motor or sensory deficits.  ED Treatments / Results  Labs (all labs ordered are listed, but only abnormal results are displayed) Labs Reviewed  BASIC METABOLIC PANEL - Abnormal; Notable for the following components:      Result Value   Glucose, Bld 140 (*)  BUN 29 (*)    Creatinine, Ser 2.00 (*)    GFR calc non Af Amer 23 (*)    GFR calc Af Amer 27 (*)    All other components within normal limits  CBC - Abnormal; Notable for the following components:   RBC 3.17 (*)    Hemoglobin 9.3 (*)    HCT 29.0 (*)    All other components within normal limits  BRAIN NATRIURETIC PEPTIDE  I-STAT TROPONIN, ED  I-STAT TROPONIN, ED    EKG EKG Interpretation  Date/Time:  Wednesday Apr 02 2018 16:35:40 EDT Ventricular Rate:  98 PR Interval:  180 QRS Duration: 78 QT Interval:  340 QTC Calculation: 434 R Axis:   66 Text Interpretation:  Normal sinus rhythm Nonspecific ST abnormality Abnormal ECG When compared with ECG of 12/27/2017, No significant change was found Confirmed by Delora Fuel ([id number]) on 04/02/2018 10:58:46 PM   Radiology Dg Chest 2 View  Result Date: 04/02/2018 CLINICAL DATA:  Shortness of breath and chest pain. EXAM: CHEST - 2 VIEW COMPARISON:  12/22/2017 FINDINGS: The heart size and mediastinal contours are within normal limits. There is no evidence of pulmonary edema, consolidation, pneumothorax, nodule or pleural fluid. The visualized skeletal structures are unremarkable. IMPRESSION: No active cardiopulmonary disease. Electronically Signed   By: Aletta Edouard M.D.   On: 04/02/2018 17:58    Procedures Procedures   Medications Ordered in  ED Medications  furosemide (LASIX) injection 40 mg (40 mg Intravenous Given 04/03/18 0030)  aspirin chewable tablet 324 mg (324 mg Oral Given 04/02/18 2356)     Initial Impression / Assessment and Plan / ED Course  I have reviewed the triage vital signs and the nursing notes.  Pertinent labs & imaging results that were available during my care of the patient were reviewed by me and considered in my medical decision making (see chart for details).  Exertional dyspnea with physical findings strongly suggestive of CHF.  Possible angina pectoris but doubt ACS.  Symptoms have been ongoing for a year, so doubt pulmonary embolism.  With markedly elevated blood pressure today, consider hypertensive urgency worsening edema probably related to her not taking her diuretic.  Old records are reviewed, and blood pressure has been generally well controlled in office setting and no reports of anything more than 1+ edema.  Sleep study did not show signs of obstructive sleep apnea.  Chest x-ray today does not show cardiomegaly or pulmonary vascular congestion.  However, overall picture is still strongly suggestive of CHF.  Will check BNP today.  Prior BNP tests have been normal.  However, with predominantly right heart failure, BNP can be normal.  Anemia and renal insufficiency present, not significantly changed from baseline.  She will be given a dose of furosemide and will assess blood pressure response to that.  3:23 AM Blood pressure has come down somewhat, although this is still elevated.  It is no longer in a dangerous range.  She had moderate diuresis to furosemide.  She was ambulated in the ED and showed no oxygen desaturation.  She is felt to be safe for discharge.  She is discharged with prescription for furosemide, follow-up with her primary care physician's office next week.  Return precautions discussed..  Final Clinical Impressions(s) / ED Diagnoses   Final diagnoses:  Shortness of breath  Peripheral  edema  Renal insufficiency  Normochromic normocytic anemia    ED Discharge Orders        Ordered    furosemide (LASIX) 40 MG  tablet  Daily     83/81/84 0375       Delora Fuel, MD 43/60/67 [phone]

## 2018-04-02 NOTE — ED Triage Notes (Signed)
The pt is c/o sob for one yhear getting worse  More exertional sob elevated bp

## 2018-04-02 NOTE — ED Notes (Signed)
No sob at present

## 2018-04-03 LAB — I-STAT TROPONIN, ED: Troponin i, poc: 0.01 ng/mL (ref 0.00–0.08)

## 2018-04-03 LAB — BRAIN NATRIURETIC PEPTIDE: B Natriuretic Peptide: 84.3 pg/mL (ref 0.0–100.0)

## 2018-04-03 MED ORDER — FUROSEMIDE 40 MG PO TABS: 40.0000 mg | ORAL_TABLET | Freq: Every day | ORAL | 0 refills | Status: DC

## 2018-04-03 NOTE — ED Notes (Signed)
Ambulated pt around the pod with no assistance or difficulty. Pt's O2 started at 100% pt dropped to 98 % while ambulating and stayed there the whole time.

## 2018-04-03 NOTE — Discharge Instructions (Addendum)
Stay on a low salt diet.  Return if symptoms are getting worse.

## 2018-04-07 ENCOUNTER — Encounter: Payer: Self-pay | Admitting: Internal Medicine

## 2018-04-07 ENCOUNTER — Other Ambulatory Visit: Payer: Self-pay

## 2018-04-07 ENCOUNTER — Ambulatory Visit (INDEPENDENT_AMBULATORY_CARE_PROVIDER_SITE_OTHER): Payer: Medicare Other | Admitting: Internal Medicine

## 2018-04-07 DIAGNOSIS — R0600 Dyspnea, unspecified: Secondary | ICD-10-CM

## 2018-04-07 DIAGNOSIS — R0609 Other forms of dyspnea: Secondary | ICD-10-CM

## 2018-04-07 DIAGNOSIS — I10 Essential (primary) hypertension: Secondary | ICD-10-CM | POA: Diagnosis not present

## 2018-04-07 MED ORDER — HYDRALAZINE HCL 50 MG PO TABS: 50.0000 mg | ORAL_TABLET | Freq: Three times a day (TID) | ORAL | 2 refills | Status: DC

## 2018-04-07 MED ORDER — HYDROCHLOROTHIAZIDE 25 MG PO TABS: 25.0000 mg | ORAL_TABLET | Freq: Two times a day (BID) | ORAL | 2 refills | Status: DC

## 2018-04-07 NOTE — Progress Notes (Signed)
   [facility] Phone: [phone]  Subjective:  Kimberly Carlson is a 72 year old female presenting to clinic for follow-up of her HTN and SOB.  HTN: Checking blood pressures at home. Have mostly been in the 678L systolics. She has been out of her HCTZ for the last month. Still taking Hydralazine 50mg  tid and Coreg 6.25mg  bid. No chest pain. She endorses lower extremity edema that has been worsening over the last couple of weeks.  Shortness of Breath: Has been going on for the last couple of months. Having dyspnea on exertion and lower extremity edema. Treated with IV Lasix in the ED 04/02/18 and prescribed Lasix on discharge from the hospital. She states this is really helping her shortness of breath.  ROS: See HPI for pertinent positives and negatives  Past Medical History- resistant HTN, T2DM, HLD, obesity  Family history reviewed for today's visit. No changes.  Social history- patient is a former smoker  Objective: BP (!) 166/60   Pulse 93   Temp 98.2 F (36.8 C) (Oral)   Ht 5\' 3"  (1.6 m)   Wt 207 lb (93.9 kg)   LMP  (LMP Unknown)   SpO2 97%   BMI 36.67 kg/m  Gen: NAD, alert, cooperative with exam HEENT: NCAT, EOMI, MMM Neck: FROM, supple CV: RRR, no murmur Resp: CTABL, no wheezes, normal work of breathing Msk: 1+ symmetric pitting edema to the mid shins bilaterally  Assessment/Plan: Resistant HTN: Uncontrolled today, but patient has been out of her HCTZ for 1 month. - Refill sent for HCTZ 25mg  bid and Hydralazine 50mg  tid - Continue Coreg 6.25mg  bid - Has new patient appt with Nephrology in June - Follow-up in 1 month  Dyspnea on exertion: Likely secondary to diastolic HF, as her shortness of breath has greatly improved after starting Lasix. - Continue Lasix 40mg  daily - Check BMP - Follow-up in 1 month   Hyman Bible, MD PGY-3

## 2018-04-07 NOTE — Patient Instructions (Signed)
It was so nice to see you today!  I have sent in refills for your hydralazine and hydrochlorothiazide. You should continue taking the Lasix since it is helping your shortness of breath and swelling. Please make sure you elevate your legs as much as possible and eat a low salt diet.  I will call you with your lab results.  We will see you back in 1 month!  -Dr. Brett Albino

## 2018-04-07 NOTE — Assessment & Plan Note (Signed)
Uncontrolled today, but patient has been out of her HCTZ for 1 month. - Refill sent for HCTZ 25mg  bid and Hydralazine 50mg  tid - Continue Coreg 6.25mg  bid - Has new patient appt with Nephrology in June - Follow-up in 1 month

## 2018-04-07 NOTE — Assessment & Plan Note (Signed)
Likely secondary to diastolic HF, as her shortness of breath has greatly improved after starting Lasix. - Continue Lasix 40mg  daily - Check BMP - Follow-up in 1 month

## 2018-04-08 LAB — BASIC METABOLIC PANEL
BUN/Creatinine Ratio: 17 (ref 12–28)
BUN: 35 mg/dL — ABNORMAL HIGH (ref 8–27)
CO2: 23 mmol/L (ref 20–29)
Calcium: 8.7 mg/dL (ref 8.7–10.3)
Chloride: 103 mmol/L (ref 96–106)
Creatinine, Ser: 2.12 mg/dL — ABNORMAL HIGH (ref 0.57–1.00)
GFR calc Af Amer: 26 mL/min/{1.73_m2} — ABNORMAL LOW (ref >59)
GFR calc non Af Amer: 22 mL/min/{1.73_m2} — ABNORMAL LOW (ref >59)
Glucose: 252 mg/dL — ABNORMAL HIGH (ref 65–99)
Potassium: 4.1 mmol/L (ref 3.5–5.2)
Sodium: 141 mmol/L (ref 134–144)

## 2018-05-01 ENCOUNTER — Encounter: Payer: Self-pay | Admitting: Internal Medicine

## 2018-05-02 ENCOUNTER — Other Ambulatory Visit: Payer: Self-pay | Admitting: Internal Medicine

## 2018-05-02 MED ORDER — FUROSEMIDE 40 MG PO TABS: 40.0000 mg | ORAL_TABLET | Freq: Every day | ORAL | 0 refills | Status: DC

## 2018-05-03 ENCOUNTER — Other Ambulatory Visit: Payer: Self-pay | Admitting: Internal Medicine

## 2018-05-03 DIAGNOSIS — E7800 Pure hypercholesterolemia, unspecified: Secondary | ICD-10-CM

## 2018-05-07 DIAGNOSIS — N183 Chronic kidney disease, stage 3 (moderate): Secondary | ICD-10-CM | POA: Diagnosis not present

## 2018-05-14 DIAGNOSIS — N183 Chronic kidney disease, stage 3 (moderate): Secondary | ICD-10-CM | POA: Diagnosis not present

## 2018-05-14 DIAGNOSIS — D631 Anemia in chronic kidney disease: Secondary | ICD-10-CM | POA: Diagnosis not present

## 2018-05-14 DIAGNOSIS — N2581 Secondary hyperparathyroidism of renal origin: Secondary | ICD-10-CM | POA: Diagnosis not present

## 2018-05-14 DIAGNOSIS — I129 Hypertensive chronic kidney disease with stage 1 through stage 4 chronic kidney disease, or unspecified chronic kidney disease: Secondary | ICD-10-CM | POA: Diagnosis not present

## 2018-05-28 ENCOUNTER — Encounter (HOSPITAL_COMMUNITY): Payer: Medicare Other

## 2018-06-16 ENCOUNTER — Other Ambulatory Visit (HOSPITAL_COMMUNITY): Payer: Self-pay | Admitting: *Deleted

## 2018-06-17 ENCOUNTER — Encounter (HOSPITAL_COMMUNITY)
Admission: RE | Admit: 2018-06-17 | Discharge: 2018-06-17 | Disposition: A | Discharge status: Home / Self Care | Payer: Medicare Other | Source: Ambulatory Visit | Attending: Nephrology | Admitting: Nephrology

## 2018-06-17 DIAGNOSIS — D631 Anemia in chronic kidney disease: Secondary | ICD-10-CM | POA: Diagnosis not present

## 2018-06-17 DIAGNOSIS — N183 Chronic kidney disease, stage 3 (moderate): Secondary | ICD-10-CM | POA: Diagnosis not present

## 2018-06-17 LAB — POCT HEMOGLOBIN-HEMACUE: Hemoglobin: 9 g/dL — ABNORMAL LOW (ref 12.0–15.0)

## 2018-06-17 MED ORDER — EPOETIN ALFA 10000 UNIT/ML IJ SOLN
INTRAMUSCULAR | Status: AC
Start: 2018-06-17 — End: 2018-06-17
  Administered 2018-06-17: 10000 [IU]
  Filled 2018-06-17: qty 1

## 2018-06-17 MED ORDER — SODIUM CHLORIDE 0.9 % IV SOLN
510.0000 mg | Freq: Once | INTRAVENOUS | Status: AC
  Administered 2018-06-17: 510 mg via INTRAVENOUS
  Filled 2018-06-17: qty 17

## 2018-06-17 MED ORDER — EPOETIN ALFA 20000 UNIT/ML IJ SOLN: 10000.0000 [IU] | INTRAMUSCULAR | Status: DC

## 2018-06-19 DIAGNOSIS — D631 Anemia in chronic kidney disease: Secondary | ICD-10-CM | POA: Diagnosis not present

## 2018-06-19 DIAGNOSIS — I129 Hypertensive chronic kidney disease with stage 1 through stage 4 chronic kidney disease, or unspecified chronic kidney disease: Secondary | ICD-10-CM | POA: Diagnosis not present

## 2018-06-19 DIAGNOSIS — N184 Chronic kidney disease, stage 4 (severe): Secondary | ICD-10-CM | POA: Diagnosis not present

## 2018-06-19 DIAGNOSIS — N2581 Secondary hyperparathyroidism of renal origin: Secondary | ICD-10-CM | POA: Diagnosis not present

## 2018-06-19 DIAGNOSIS — I10 Essential (primary) hypertension: Secondary | ICD-10-CM | POA: Diagnosis not present

## 2018-06-23 ENCOUNTER — Ambulatory Visit (HOSPITAL_COMMUNITY)
Admission: RE | Admit: 2018-06-23 | Discharge: 2018-06-23 | Disposition: A | Discharge status: Home / Self Care | Payer: Medicare Other | Source: Ambulatory Visit | Attending: Nephrology | Admitting: Nephrology

## 2018-06-23 DIAGNOSIS — N183 Chronic kidney disease, stage 3 (moderate): Secondary | ICD-10-CM | POA: Diagnosis not present

## 2018-06-23 DIAGNOSIS — D631 Anemia in chronic kidney disease: Secondary | ICD-10-CM | POA: Insufficient documentation

## 2018-06-23 DIAGNOSIS — Z5181 Encounter for therapeutic drug level monitoring: Secondary | ICD-10-CM | POA: Insufficient documentation

## 2018-06-23 DIAGNOSIS — Z79899 Other long term (current) drug therapy: Secondary | ICD-10-CM | POA: Insufficient documentation

## 2018-06-23 LAB — POCT HEMOGLOBIN-HEMACUE: Hemoglobin: 9.5 g/dL — ABNORMAL LOW (ref 12.0–15.0)

## 2018-06-23 MED ORDER — EPOETIN ALFA 20000 UNIT/ML IJ SOLN: 10000.0000 [IU] | INTRAMUSCULAR | Status: DC

## 2018-06-23 MED ORDER — EPOETIN ALFA 10000 UNIT/ML IJ SOLN
INTRAMUSCULAR | Status: AC
  Administered 2018-06-23: 10000 [IU]
  Filled 2018-06-23: qty 1

## 2018-06-24 ENCOUNTER — Telehealth: Payer: Self-pay | Admitting: Family Medicine

## 2018-06-24 NOTE — Telephone Encounter (Signed)
Diabetes management - recheck A1c. Establish with new PCP. CR

## 2018-06-30 ENCOUNTER — Encounter (HOSPITAL_COMMUNITY): Payer: Self-pay | Admitting: Emergency Medicine

## 2018-06-30 ENCOUNTER — Other Ambulatory Visit: Payer: Self-pay

## 2018-06-30 ENCOUNTER — Observation Stay (HOSPITAL_COMMUNITY)
Admission: EM | Admit: 2018-06-30 | Discharge: 2018-07-01 | Disposition: A | Discharge status: Home / Self Care | Payer: Medicare Other | Attending: Family Medicine | Admitting: Family Medicine

## 2018-06-30 ENCOUNTER — Emergency Department (HOSPITAL_COMMUNITY): Payer: Medicare Other

## 2018-06-30 ENCOUNTER — Ambulatory Visit (HOSPITAL_COMMUNITY)
Admission: RE | Admit: 2018-06-30 | Discharge: 2018-06-30 | Disposition: A | Discharge status: Home / Self Care | Payer: Medicare Other | Source: Ambulatory Visit | Attending: Nephrology | Admitting: Nephrology

## 2018-06-30 DIAGNOSIS — R0789 Other chest pain: Secondary | ICD-10-CM | POA: Diagnosis not present

## 2018-06-30 DIAGNOSIS — M542 Cervicalgia: Secondary | ICD-10-CM | POA: Diagnosis present

## 2018-06-30 DIAGNOSIS — N182 Chronic kidney disease, stage 2 (mild): Secondary | ICD-10-CM | POA: Diagnosis not present

## 2018-06-30 DIAGNOSIS — Z87891 Personal history of nicotine dependence: Secondary | ICD-10-CM | POA: Insufficient documentation

## 2018-06-30 DIAGNOSIS — Z79899 Other long term (current) drug therapy: Secondary | ICD-10-CM | POA: Diagnosis not present

## 2018-06-30 DIAGNOSIS — N170 Acute kidney failure with tubular necrosis: Secondary | ICD-10-CM | POA: Diagnosis not present

## 2018-06-30 DIAGNOSIS — M436 Torticollis: Secondary | ICD-10-CM | POA: Diagnosis not present

## 2018-06-30 DIAGNOSIS — R079 Chest pain, unspecified: Secondary | ICD-10-CM | POA: Diagnosis not present

## 2018-06-30 DIAGNOSIS — E1122 Type 2 diabetes mellitus with diabetic chronic kidney disease: Secondary | ICD-10-CM | POA: Insufficient documentation

## 2018-06-30 DIAGNOSIS — N3000 Acute cystitis without hematuria: Secondary | ICD-10-CM

## 2018-06-30 DIAGNOSIS — I129 Hypertensive chronic kidney disease with stage 1 through stage 4 chronic kidney disease, or unspecified chronic kidney disease: Secondary | ICD-10-CM | POA: Diagnosis not present

## 2018-06-30 DIAGNOSIS — R6883 Chills (without fever): Secondary | ICD-10-CM | POA: Insufficient documentation

## 2018-06-30 DIAGNOSIS — Z794 Long term (current) use of insulin: Secondary | ICD-10-CM | POA: Insufficient documentation

## 2018-06-30 DIAGNOSIS — N179 Acute kidney failure, unspecified: Secondary | ICD-10-CM | POA: Diagnosis present

## 2018-06-30 DIAGNOSIS — R350 Frequency of micturition: Secondary | ICD-10-CM | POA: Diagnosis not present

## 2018-06-30 DIAGNOSIS — N189 Chronic kidney disease, unspecified: Secondary | ICD-10-CM | POA: Diagnosis present

## 2018-06-30 DIAGNOSIS — I120 Hypertensive chronic kidney disease with stage 5 chronic kidney disease or end stage renal disease: Secondary | ICD-10-CM | POA: Diagnosis not present

## 2018-06-30 LAB — BASIC METABOLIC PANEL
Anion gap: 9 (ref 5–15)
BUN: 43 mg/dL — ABNORMAL HIGH (ref 8–23)
CO2: 28 mmol/L (ref 22–32)
Calcium: 9.1 mg/dL (ref 8.9–10.3)
Chloride: 103 mmol/L (ref 98–111)
Creatinine, Ser: 3 mg/dL — ABNORMAL HIGH (ref 0.44–1.00)
GFR calc Af Amer: 17 mL/min — ABNORMAL LOW (ref >60)
GFR calc non Af Amer: 14 mL/min — ABNORMAL LOW (ref >60)
Glucose, Bld: 218 mg/dL — ABNORMAL HIGH (ref 70–99)
Potassium: 4.3 mmol/L (ref 3.5–5.1)
Sodium: 140 mmol/L (ref 135–145)

## 2018-06-30 LAB — CBC
HCT: 30.5 % — ABNORMAL LOW (ref 36.0–46.0)
Hemoglobin: 9.4 g/dL — ABNORMAL LOW (ref 12.0–15.0)
MCH: 29.8 pg (ref 26.0–34.0)
MCHC: 30.8 g/dL (ref 30.0–36.0)
MCV: 96.8 fL (ref 78.0–100.0)
Platelets: 171 10*3/uL (ref 150–400)
RBC: 3.15 MIL/uL — ABNORMAL LOW (ref 3.87–5.11)
RDW: 13.3 % (ref 11.5–15.5)
WBC: 11.9 10*3/uL — ABNORMAL HIGH (ref 4.0–10.5)

## 2018-06-30 LAB — I-STAT TROPONIN, ED: Troponin i, poc: 0 ng/mL (ref 0.00–0.08)

## 2018-06-30 LAB — POCT HEMOGLOBIN-HEMACUE: Hemoglobin: 8.9 g/dL — ABNORMAL LOW (ref 12.0–15.0)

## 2018-06-30 MED ORDER — EPOETIN ALFA 10000 UNIT/ML IJ SOLN
INTRAMUSCULAR | Status: AC
  Administered 2018-06-30: 10000 [IU]
  Filled 2018-06-30: qty 1

## 2018-06-30 MED ORDER — EPOETIN ALFA 20000 UNIT/ML IJ SOLN: 10000.0000 [IU] | INTRAMUSCULAR | Status: DC

## 2018-06-30 NOTE — ED Triage Notes (Signed)
Pt c/o neck pain x 2 days, pain began radiating to her chest today. Pain worse with movement. Pt hypertensive in triage.

## 2018-07-01 ENCOUNTER — Encounter (HOSPITAL_COMMUNITY): Payer: Self-pay | Admitting: Emergency Medicine

## 2018-07-01 DIAGNOSIS — N189 Chronic kidney disease, unspecified: Secondary | ICD-10-CM | POA: Diagnosis present

## 2018-07-01 DIAGNOSIS — N170 Acute kidney failure with tubular necrosis: Secondary | ICD-10-CM | POA: Diagnosis not present

## 2018-07-01 DIAGNOSIS — R0789 Other chest pain: Secondary | ICD-10-CM | POA: Insufficient documentation

## 2018-07-01 DIAGNOSIS — N182 Chronic kidney disease, stage 2 (mild): Secondary | ICD-10-CM

## 2018-07-01 DIAGNOSIS — N179 Acute kidney failure, unspecified: Secondary | ICD-10-CM | POA: Diagnosis present

## 2018-07-01 LAB — RENAL FUNCTION PANEL
Albumin: 2.9 g/dL — ABNORMAL LOW (ref 3.5–5.0)
Anion gap: 11 (ref 5–15)
BUN: 39 mg/dL — ABNORMAL HIGH (ref 8–23)
CO2: 25 mmol/L (ref 22–32)
Calcium: 8.5 mg/dL — ABNORMAL LOW (ref 8.9–10.3)
Chloride: 104 mmol/L (ref 98–111)
Creatinine, Ser: 2.45 mg/dL — ABNORMAL HIGH (ref 0.44–1.00)
GFR calc Af Amer: 21 mL/min — ABNORMAL LOW (ref >60)
GFR calc non Af Amer: 18 mL/min — ABNORMAL LOW (ref >60)
Glucose, Bld: 163 mg/dL — ABNORMAL HIGH (ref 70–99)
Phosphorus: 3.8 mg/dL (ref 2.5–4.6)
Potassium: 4.1 mmol/L (ref 3.5–5.1)
Sodium: 140 mmol/L (ref 135–145)

## 2018-07-01 LAB — URINALYSIS, ROUTINE W REFLEX MICROSCOPIC
Bacteria, UA: NONE SEEN
Bilirubin Urine: NEGATIVE
Glucose, UA: 50 mg/dL — AB
Ketones, ur: NEGATIVE mg/dL
Nitrite: NEGATIVE
Protein, ur: >=300 mg/dL — AB
Specific Gravity, Urine: 1.017 (ref 1.005–1.030)
WBC, UA: >50 WBC/hpf — ABNORMAL HIGH (ref 0–5)
pH: 5 (ref 5.0–8.0)

## 2018-07-01 LAB — CBC WITH DIFFERENTIAL/PLATELET
Abs Immature Granulocytes: 0.1 10*3/uL (ref 0.0–0.1)
Basophils Absolute: 0.1 10*3/uL (ref 0.0–0.1)
Basophils Relative: 1 %
Eosinophils Absolute: 0.2 10*3/uL (ref 0.0–0.7)
Eosinophils Relative: 2 %
HCT: 25.6 % — ABNORMAL LOW (ref 36.0–46.0)
Hemoglobin: 8.1 g/dL — ABNORMAL LOW (ref 12.0–15.0)
Immature Granulocytes: 1 %
Lymphocytes Relative: 29 %
Lymphs Abs: 3.1 10*3/uL (ref 0.7–4.0)
MCH: 30.2 pg (ref 26.0–34.0)
MCHC: 31.6 g/dL (ref 30.0–36.0)
MCV: 95.5 fL (ref 78.0–100.0)
Monocytes Absolute: 0.9 10*3/uL (ref 0.1–1.0)
Monocytes Relative: 9 %
Neutro Abs: 6.4 10*3/uL (ref 1.7–7.7)
Neutrophils Relative %: 58 %
Platelets: 146 10*3/uL — ABNORMAL LOW (ref 150–400)
RBC: 2.68 MIL/uL — ABNORMAL LOW (ref 3.87–5.11)
RDW: 13.3 % (ref 11.5–15.5)
WBC: 10.7 10*3/uL — ABNORMAL HIGH (ref 4.0–10.5)

## 2018-07-01 LAB — BASIC METABOLIC PANEL
Anion gap: 9 (ref 5–15)
BUN: 38 mg/dL — ABNORMAL HIGH (ref 8–23)
CO2: 24 mmol/L (ref 22–32)
Calcium: 8.2 mg/dL — ABNORMAL LOW (ref 8.9–10.3)
Chloride: 109 mmol/L (ref 98–111)
Creatinine, Ser: 2.43 mg/dL — ABNORMAL HIGH (ref 0.44–1.00)
GFR calc Af Amer: 21 mL/min — ABNORMAL LOW (ref >60)
GFR calc non Af Amer: 18 mL/min — ABNORMAL LOW (ref >60)
Glucose, Bld: 99 mg/dL (ref 70–99)
Potassium: 3.5 mmol/L (ref 3.5–5.1)
Sodium: 142 mmol/L (ref 135–145)

## 2018-07-01 LAB — CBC
HCT: 29 % — ABNORMAL LOW (ref 36.0–46.0)
Hemoglobin: 9.1 g/dL — ABNORMAL LOW (ref 12.0–15.0)
MCH: 29.9 pg (ref 26.0–34.0)
MCHC: 31.4 g/dL (ref 30.0–36.0)
MCV: 95.4 fL (ref 78.0–100.0)
Platelets: 153 10*3/uL (ref 150–400)
RBC: 3.04 MIL/uL — ABNORMAL LOW (ref 3.87–5.11)
RDW: 13.2 % (ref 11.5–15.5)
WBC: 12.6 10*3/uL — ABNORMAL HIGH (ref 4.0–10.5)

## 2018-07-01 LAB — I-STAT TROPONIN, ED: Troponin i, poc: 0 ng/mL (ref 0.00–0.08)

## 2018-07-01 LAB — HEMOGLOBIN A1C
Hgb A1c MFr Bld: 6.3 % — ABNORMAL HIGH (ref 4.8–5.6)
Mean Plasma Glucose: 134.11 mg/dL

## 2018-07-01 MED ORDER — METHOCARBAMOL 1000 MG/10ML IJ SOLN: 1000.0000 mg | Freq: Once | INTRAMUSCULAR | Status: DC

## 2018-07-01 MED ORDER — SODIUM CHLORIDE 0.9 % IV BOLUS
500.0000 mL | Freq: Once | INTRAVENOUS | Status: AC
  Administered 2018-07-01: 500 mL via INTRAVENOUS

## 2018-07-01 MED ORDER — ACETAMINOPHEN 500 MG PO TABS
1000.0000 mg | ORAL_TABLET | Freq: Once | ORAL | Status: AC
  Administered 2018-07-01: 1000 mg via ORAL
  Filled 2018-07-01: qty 2

## 2018-07-01 MED ORDER — LABETALOL HCL 5 MG/ML IV SOLN
10.0000 mg | Freq: Once | INTRAVENOUS | Status: AC
  Administered 2018-07-01: 10 mg via INTRAVENOUS
  Filled 2018-07-01 (×2): qty 4

## 2018-07-01 MED ORDER — LIDOCAINE 5 % EX PTCH
1.0000 | MEDICATED_PATCH | CUTANEOUS | Status: DC
  Administered 2018-07-01: 1 via TRANSDERMAL
  Filled 2018-07-01: qty 1

## 2018-07-01 MED ORDER — ACETAMINOPHEN 325 MG PO TABS
650.0000 mg | ORAL_TABLET | Freq: Four times a day (QID) | ORAL | Status: DC | PRN
  Administered 2018-07-01: 650 mg via ORAL
  Filled 2018-07-01: qty 2

## 2018-07-01 MED ORDER — LIDOCAINE 5 % EX PTCH: 1.0000 | MEDICATED_PATCH | CUTANEOUS | 0 refills | Status: DC

## 2018-07-01 MED ORDER — DEXTROSE 5 % IV SOLN
1000.0000 mg | Freq: Once | INTRAVENOUS | Status: AC
  Administered 2018-07-01: 1000 mg via INTRAVENOUS
  Filled 2018-07-01: qty 10

## 2018-07-01 MED ORDER — ENOXAPARIN SODIUM 30 MG/0.3ML ~~LOC~~ SOLN
30.0000 mg | SUBCUTANEOUS | Status: DC
  Administered 2018-07-01: 30 mg via SUBCUTANEOUS
  Filled 2018-07-01: qty 0.3

## 2018-07-01 MED ORDER — INSULIN GLARGINE 100 UNIT/ML ~~LOC~~ SOLN
15.0000 [IU] | Freq: Every day | SUBCUTANEOUS | Status: DC
  Administered 2018-07-01: 15 [IU] via SUBCUTANEOUS
  Filled 2018-07-01: qty 0.15

## 2018-07-01 MED ORDER — CARVEDILOL 6.25 MG PO TABS
6.2500 mg | ORAL_TABLET | Freq: Two times a day (BID) | ORAL | Status: DC
  Administered 2018-07-01 (×2): 6.25 mg via ORAL
  Filled 2018-07-01 (×2): qty 1

## 2018-07-01 MED ORDER — ATORVASTATIN CALCIUM 40 MG PO TABS: 40.0000 mg | ORAL_TABLET | Freq: Every day | ORAL | Status: DC

## 2018-07-01 MED ORDER — SODIUM CHLORIDE 0.9 % IV SOLN: INTRAVENOUS | Status: DC

## 2018-07-01 MED ORDER — HYDRALAZINE HCL 50 MG PO TABS
50.0000 mg | ORAL_TABLET | Freq: Three times a day (TID) | ORAL | Status: DC
  Administered 2018-07-01 (×2): 50 mg via ORAL
  Filled 2018-07-01 (×2): qty 1

## 2018-07-01 MED ORDER — POLYETHYLENE GLYCOL 3350 17 G PO PACK: 17.0000 g | PACK | Freq: Every day | ORAL | Status: DC | PRN

## 2018-07-01 MED ORDER — ASPIRIN 81 MG PO CHEW
81.0000 mg | CHEWABLE_TABLET | Freq: Every day | ORAL | Status: DC
  Administered 2018-07-01: 81 mg via ORAL
  Filled 2018-07-01: qty 1

## 2018-07-01 MED ORDER — SODIUM CHLORIDE 0.9 % IV SOLN
1.0000 g | Freq: Once | INTRAVENOUS | Status: AC
  Administered 2018-07-01: 1 g via INTRAVENOUS
  Filled 2018-07-01: qty 10

## 2018-07-01 MED ORDER — SODIUM CHLORIDE 0.9 % IV SOLN
INTRAVENOUS | Status: AC
  Administered 2018-07-01: 04:00:00 via INTRAVENOUS

## 2018-07-01 MED ORDER — CALCIUM CARBONATE-VITAMIN D 500-200 MG-UNIT PO TABS
1.0000 | ORAL_TABLET | Freq: Every day | ORAL | Status: DC
  Administered 2018-07-01: 1 via ORAL
  Filled 2018-07-01: qty 1

## 2018-07-01 NOTE — ED Notes (Signed)
Attempted to call report; no answer. Retail banker to request call back from nurse.

## 2018-07-01 NOTE — ED Provider Notes (Signed)
[facility] EMERGENCY DEPARTMENT Provider Note   CSN: [id number] Arrival date & time: 06/30/18  1820     History   Chief Complaint Chief Complaint  Patient presents with  . Neck Pain  . Chest Pain    HPI Kimberly Carlson is a 72 y.o. female.  The history is provided by the patient.  Neck Pain   This is a new problem. The current episode started more than 2 days ago. The problem occurs constantly. The problem has not changed since onset.The pain is associated with nothing. The maximum temperature recorded prior to her arrival was 100 to 100.9 F. The pain is present in the generalized neck. The quality of the pain is described as stabbing and shooting. Radiates to: left chest. The pain is moderate. The pain is the same all the time. Associated symptoms include chest pain. Pertinent negatives include no photophobia, no visual change, no syncope, no numbness, no weight loss, no headaches, no bowel incontinence, no bladder incontinence, no leg pain, no paresis, no tingling and no weakness. Associated symptoms comments: Chills and urinary frequency . She has tried nothing for the symptoms. The treatment provided no relief.  Chest Pain   This is a new problem. The current episode started more than 2 days ago. The problem has not changed since onset.The pain is present in the lateral region. The pain is moderate. Quality: spasmotic. The pain does not radiate. Associated symptoms include a fever. Pertinent negatives include no abdominal pain, no diaphoresis, no headaches, no leg pain, no numbness, no palpitations, no shortness of breath, no sputum production, no syncope and no weakness.    Past Medical History:  Diagnosis Date  . Back pain    herniated disc lower back  . Diabetes mellitus without complication ([place])   . Hypertension     Patient Active Problem List   Diagnosis Date Noted  . Proteinuria 02/17/2018  . At risk for obstructive sleep apnea 01/07/2018  .  Healthcare maintenance 11/13/2017  . Dyspnea on exertion 02/07/2017  . Hypovitaminosis D 02/28/2016  . Anemia 08/10/2015  . Hematuria, undiagnosed cause 06/09/2015  . Cocaine use 06/08/2015  . DEGENERATIVE DISC DISEASE 11/09/2009  . History of tobacco use 09/03/2009  . Obesity 07/08/2009  . HERNIATED LUMBAR DISC 05/14/2007  . DM2 (diabetes mellitus, type 2) ([place]) 01/23/2007  . HYPERCHOLESTEROLEMIA 01/23/2007  . Resistant hypertension 01/23/2007    Past Surgical History:  Procedure Laterality Date  . ABDOMINAL HYSTERECTOMY       OB History   None      Home Medications    Prior to Admission medications   Medication Sig Start Date End Date Taking? Authorizing Provider  acetaminophen (TYLENOL) 500 MG tablet Take 1,000 mg by mouth every 6 (six) hours as needed (for headaches).    [provider]  aspirin (ASPIRIN CHILDRENS) 81 MG chewable tablet Chew 81 mg by mouth daily.      [provider]  atorvastatin (LIPITOR) 40 MG tablet TAKE 1 TABLET BY MOUTH  DAILY AT [facility] 05/05/18   Mayo, Pete Pelt, MD  calcium-vitamin D (OSCAL 500/200 D-3) 500-200 MG-UNIT tablet Take 2 tablets by mouth daily with breakfast. Patient taking differently: Take 1 tablet by mouth daily with breakfast.  02/27/16   Olam Idler, MD  carvedilol (COREG) 6.25 MG tablet Take 1 tablet (6.25 mg total) by mouth 2 (two) times daily. 03/04/18 03/04/19  Mayo, Pete Pelt, MD  furosemide (LASIX) 40 MG tablet Take 1 tablet (  40 mg total) by mouth daily. 05/02/18   Mayo, Pete Pelt, MD  glucose blood (ONE TOUCH ULTRA TEST) test strip Check sugar 6 x daily 10/02/16   Mayo, Pete Pelt, MD  hydrALAZINE (APRESOLINE) 50 MG tablet Take 1 tablet (50 mg total) by mouth 3 (three) times daily. 04/07/18   Mayo, Pete Pelt, MD  hydrochlorothiazide (HYDRODIURIL) 25 MG tablet Take 1 tablet (25 mg total) by mouth 2 (two) times daily. 04/07/18   Mayo, Pete Pelt, MD  insulin glargine (LANTUS) 100 UNIT/ML injection INJECT SUBCUTANEOUSLY 28   UNITS ONCE DAILY 10/11/17   Mayo, Pete Pelt, MD  Insulin Syringe-Needle U-100 (INSULIN SYRINGE 1CC/31GX5/16") 31G X 5/16" 1 ML MISC 1 each See admin instructions by Does not apply route. Use daily with insulin. 10/11/17   Mayo, Pete Pelt, MD  ONE TOUCH LANCETS MISC 1 each by Does not apply route See admin instructions. Check blood sugar once daily. 02/21/15   Olam Idler, MD  polyethylene glycol [facility] / Floria Raveling) packet Take 17 g by mouth daily as needed for mild constipation. 02/06/18   Mayo, Pete Pelt, MD    Family History Family History  Adopted: Yes    Social History Social History   Tobacco Use  . Smoking status: Former Smoker    Packs/day: 1.00    Types: Cigarettes    Start date: 04/26/2014    Last attempt to quit: 07/23/2015    Years since quitting: 2.9  . Smokeless tobacco: Never Used  . Tobacco comment: Started in her 8's with multiple quit attempts for about 1 year at a time.  Substance Use Topics  . Alcohol use: No    Alcohol/week: 0.0 oz  . Drug use: No     Allergies   Amlodipine; Augmentin [amoxicillin-pot clavulanate]; Clonidine derivatives; Doxycycline; and Metformin   Review of Systems Review of Systems  Constitutional: Positive for fever. Negative for diaphoresis and weight loss.  Eyes: Negative for photophobia.  Respiratory: Negative for sputum production, chest tightness and shortness of breath.   Cardiovascular: Positive for chest pain. Negative for palpitations, leg swelling and syncope.  Gastrointestinal: Negative for abdominal pain and bowel incontinence.  Genitourinary: Negative for bladder incontinence.  Musculoskeletal: Positive for neck pain.  Neurological: Negative for tingling, weakness, numbness and headaches.  All other systems reviewed and are negative.    Physical Exam Updated Vital Signs BP (!) 180/70 (BP Location: Right Arm)   Pulse 74   Temp 100.2 F (37.9 C) (Oral)   Resp 13   Ht 5\' 3"  (1.6 m)   Wt 92.5 kg (204 lb)    LMP  (LMP Unknown)   SpO2 98%   BMI 36.14 kg/m   Physical Exam  Constitutional: She is oriented to person, place, and time. She appears well-developed and well-nourished. No distress.  HENT:  Head: Normocephalic and atraumatic.  Mouth/Throat: No oropharyngeal exudate.  Eyes: Pupils are equal, round, and reactive to light. Conjunctivae are normal.  Neck: Normal range of motion. Neck supple. No JVD present.  Palpable spasm   Cardiovascular: Normal rate, regular rhythm, normal heart sounds and intact distal pulses.  Pulmonary/Chest: Effort normal and breath sounds normal. No stridor. She has no wheezes. She has no rales.  Abdominal: Soft. Bowel sounds are normal. She exhibits no mass. There is no tenderness. There is no rebound and no guarding.  Musculoskeletal: Normal range of motion. She exhibits no edema.  Neurological: She is alert and oriented to person, place, and time. She displays normal reflexes.  Skin: Skin is warm and dry. Capillary refill takes less than 2 seconds.  Psychiatric: She has a normal mood and affect.  Nursing note and vitals reviewed.    ED Treatments / Results  Labs (all labs ordered are listed, but only abnormal results are displayed) Results for orders placed or performed during the hospital encounter of 61/44/31  Basic metabolic panel  Result Value Ref Range   Sodium 140 135 - 145 mmol/L   Potassium 4.3 3.5 - 5.1 mmol/L   Chloride 103 98 - 111 mmol/L   CO2 28 22 - 32 mmol/L   Glucose, Bld 218 (H) 70 - 99 mg/dL   BUN 43 (H) 8 - 23 mg/dL   Creatinine, Ser 3.00 (H) 0.44 - 1.00 mg/dL   Calcium 9.1 8.9 - 10.3 mg/dL   GFR calc non Af Amer 14 (L) >60 mL/min   GFR calc Af Amer 17 (L) >60 mL/min   Anion gap 9 5 - 15  CBC  Result Value Ref Range   WBC 11.9 (H) 4.0 - 10.5 K/uL   RBC 3.15 (L) 3.87 - 5.11 MIL/uL   Hemoglobin 9.4 (L) 12.0 - 15.0 g/dL   HCT 30.5 (L) 36.0 - 46.0 %   MCV 96.8 78.0 - 100.0 fL   MCH 29.8 26.0 - 34.0 pg   MCHC 30.8 30.0 - 36.0  g/dL   RDW 13.3 11.5 - 15.5 %   Platelets 171 150 - 400 K/uL  Urinalysis, Routine w reflex microscopic  Result Value Ref Range   Color, Urine YELLOW YELLOW   APPearance HAZY (A) CLEAR   Specific Gravity, Urine 1.017 1.005 - 1.030   pH 5.0 5.0 - 8.0   Glucose, UA 50 (A) NEGATIVE mg/dL   Hgb urine dipstick SMALL (A) NEGATIVE   Bilirubin Urine NEGATIVE NEGATIVE   Ketones, ur NEGATIVE NEGATIVE mg/dL   Protein, ur >=300 (A) NEGATIVE mg/dL   Nitrite NEGATIVE NEGATIVE   Leukocytes, UA MODERATE (A) NEGATIVE   RBC / HPF 0-5 0 - 5 RBC/hpf   WBC, UA >50 (H) 0 - 5 WBC/hpf   Bacteria, UA NONE SEEN NONE SEEN   Squamous Epithelial / LPF 6-10 0 - 5   Mucus PRESENT    Hyaline Casts, UA PRESENT    Non Squamous Epithelial 0-5 (A) NONE SEEN  I-stat troponin, ED  Result Value Ref Range   Troponin i, poc 0.00 0.00 - 0.08 ng/mL   Comment 3           Dg Chest 2 View  Result Date: 06/30/2018 CLINICAL DATA:  Left-sided chest pain. EXAM: CHEST - 2 VIEW COMPARISON:  None. FINDINGS: The heart size and mediastinal contours are within normal limits. Both lungs are clear. The visualized skeletal structures are unremarkable. IMPRESSION: No active cardiopulmonary disease. Electronically Signed   By: Dorise Bullion III M.D   On: 06/30/2018 19:12    EKG EKG Interpretation  Date/Time:  Monday June 30 2018 18:25:35 EDT Ventricular Rate:  82 PR Interval:  180 QRS Duration: 82 QT Interval:  368 QTC Calculation: 429 R Axis:   50 Text Interpretation:  Normal sinus rhythm ST abnormality, possible digitalis effect Confirmed by Dory Horn) on 07/01/2018 12:48:05 AM   Radiology Dg Chest 2 View  Result Date: 06/30/2018 CLINICAL DATA:  Left-sided chest pain. EXAM: CHEST - 2 VIEW COMPARISON:  None. FINDINGS: The heart size and mediastinal contours are within normal limits. Both lungs are clear. The visualized skeletal structures are unremarkable. IMPRESSION: No  active cardiopulmonary disease.  Electronically Signed   By: Dorise Bullion III M.D   On: 06/30/2018 19:12    Procedures Procedures (including critical care time)  Medications Ordered in ED Medications  sodium chloride 0.9 % bolus 500 mL (has no administration in time range)  0.9 %  sodium chloride infusion (has no administration in time range)  cefTRIAXone (ROCEPHIN) 1 g in sodium chloride 0.9 % 100 mL IVPB (has no administration in time range)  acetaminophen (TYLENOL) tablet 1,000 mg (has no administration in time range)  lidocaine (LIDODERM) 5 % 1 patch (has no administration in time range)  methocarbamol (ROBAXIN) 1,000 mg in dextrose 5 % 50 mL IVPB (has no administration in time range)      Final Clinical Impressions(s) / ED Diagnoses   Final diagnoses:  Torticollis  Acute cystitis without hematuria  Acute renal failure with acute tubular necrosis superimposed on stage 2 chronic kidney disease ([place])  Other chest pain    Will admit for UTi with AKI, neck and chest pain likely MSK in nature as palpable spasm on exam.     Philo Kurtz, MD 07/01/18 1856

## 2018-07-01 NOTE — H&P (Addendum)
[facility] Admission History and Physical Service Pager: [phone]  Patient name: Kimberly Carlson Medical record number: [id number] Date of birth: 07-13-1946 Age: 72 y.o. Gender: female  Primary Care Provider: Steve Rattler, DO Consultants: None  Code Status: Full   Chief Complaint: Neck pain   Assessment and Plan: Kimberly Carlson is a 72 y.o. female presenting with a five day history of progressive neck pain radiating to her left breast. PMH is significant for resistant hypertension, type 2 diabetes mellitus, hyperlipidemia, previous tobacco use, DOE, normocytic anemia, degenerative disc disease, and chronic renal failure.   Acute on chronic kidney disease: Cr 3.0 today, baseline around 2.0. Pt follows with [facility] (Dr. Posey Pronto), recently doubled her lasix dose x5 days two weeks ago. Endorses normal fluid intake and urinary output. Denies NSAID use. On exam, she is hypertensive to the 824'M systolic with 2+ LE pitting edema. Received 500 ml bolus in the ED. Cr increase most likely 2/2 to recent increase in Lasix therapy. However differential could also include fluid overload with 2+ pitting edema (although CXR without pulmonary edema and exam without crackles) vs hypertensive emergency vs non-disclosed NSAID use. Unlikely obstruction, pt without flank pain and has normal output.  -Admit to the floor; attending Dr. Nori Riis  -BMP in the am  -s/p 500 ml NS bolus in the ED   -mIVF NS 125 ml/hr x 8 hrs  -Avoid nephrotoxic medications   Hypertension: Elevated to max of 214/64. Takes hydralazine 50mg  TID and carvedilol 6.25 mg BID at home for control, endorses strict compliance. No longer taking the HCTZ noted on her medication list. She reports monitoring her blood pressure at home, and has been ranging around 353-614'E systolic over the past week. Denies any concurrent vision changes, endorses chronic headaches unrelated to BPs. S/p labetalol 10 mg IV  following admission, with improvement in her BP to 315'Q systolic. Will monitor.  -S/p Labetalol 10mg  IV -Cont home hydralazine 50 mg TID  -Cont home carvedilol 6.25 mg BID   Neck pain, radiating to L axilla/chest: Pt reports a five day history of progressive bilateral neck spasms, with chest/axilla spasms developing in the past day that is constant and worse with movement. No associated fever, SOB, weakness, numbness, or diaphoresis. She has limited cervical ROM due to pain, with tenderness to palpation of her lateral neck on exam. CXR without acute cardiopulmonary disease. Troponin negative x 2. EKG NSR. MR cervical obtained in 2002 showing degenerative changes with foraminal stenosis at R C5-6. This is likely MSK in origin giving it's spasmic in nature and exacerbated with movement. Could consider cervical radiculopathy, however would expect radiation to her arm. ACS unlikely, troponin negative with no ischemic changes on EKG. Low concern for infection/meningitis. Could consider additional imaging if not improving or clinical deterioration.  -S/p lidocaine patch and IV robaxin in the ED; reassess effectiveness in the am for further therapy  -Cont lidocaine patch q24   -K pad as needed   Asymptomatic bacteriuria: U/A hazy with moderate leukocytes and >50 WBC. Pt elevated temperature to max of 100.2, WBC 11.9. However, pt not endorsing any urinary symptoms. S/p 1 dose of ceftriaxone 1g in the ED. Proteinuria is chronic, and lack of nitrites makes bacterial infection less likely.  -follow ED urine culture -Reassess if symptoms in the am   Type 2 diabetes: Last A1c 8.2 in 10/2017. Glucose 218 today. Uses Lantus 28 units daily at home and follows at [facility] clinic for control.  -Obtain  Hgb A1c  -Monitor glucose q 4 hours  -Cont home lantus, start with 15 units; titrate as necessary -Cont home atorvastatin -Cont home aspirin     Normocytic, normochromic anemia: Hgb 9.4 today, baseline around 9. Iron  panel in 12/2017 suggestive of anemia of chronic disease. B12/folate in 12/2017 wnl. No signs of acute or active bleeding. Receives procrit injections as outpatient. -Monitor CBC in the am   FEN/GI: mIVF 125 ml/hr x 8 hrs, heart healthy diet  Prophylaxis: Lovenox   Disposition: Admit to the floor, stable.   History of Present Illness:  Kimberly Carlson is a 72 y.o. female, with a past history significant for resistant hypertension, type 2 diabetes mellitus, degenerative disc disease, and chronic renal failure, presenting with a five day history of progressive neck pain radiating to her left breast. The neck pain is located bilaterally on the lateral portions and wraps around her posterior neck, spasmic in nature. It has been progressive and constant, and started to have spasmic radiation to her left breast/axilla earlier yesterday. She notes it is worse with essentially all movement, and most painful when she wakes up in the morning. However, she is able to have a full nights sleep without any concern. She has tried tylenol and heating pads with minimal relief. She denies any associated nausea, vomiting, difficulty breathing, diaphoresis, vision changes, or fever with the pain.   For her renal concerns, she sees Dr. Posey Pronto at [facility]. She recently saw him approximately 2 weeks ago, and at that time he doubled her lasix dose for five days. She has now been on her regular lasix 40 mg daily dose since last Tuesday. She has been having normal urinary output, and eating/drinking as normal. Denies any NSAID or illicit drug use. She denies any dysuria, increased urinary frequency, hesitantacy, flank pain, or suprapubic tenderness. Her last UTI was approximately two years ago and does not endorse any similar symptoms currently.    On arrival to the ED, she is hypertensive to 197/69, but otherwise hemodynamically stable. CXR without acute cardiopulmonary disease. Troponin negative x2. EKG NSR  without ischemic changes. CBC and BMP remarkable for Cr 3.00, BUN 43, WBC 11.9, Hgb 9.4, and glucose 218. U/A obtained, with a hazy appearance, moderate leukocytes, 50 glucose, >300 protein, and >50 WBC. She received tylenol, lidocaine patch, and IV robaxin for her neck/axilla spasms. She also received a 53ml NS bolus and one dose of IV ceftriaxone.   Review Of Systems: Please see HPI for pertinent ROS.   ROS  Patient Active Problem List   Diagnosis Date Noted  . Acute on chronic renal failure ([place]) 07/01/2018  . Other chest pain   . Proteinuria 02/17/2018  . At risk for obstructive sleep apnea 01/07/2018  . Healthcare maintenance 11/13/2017  . Dyspnea on exertion 02/07/2017  . Hypovitaminosis D 02/28/2016  . Anemia 08/10/2015  . Hematuria, undiagnosed cause 06/09/2015  . Cocaine use 06/08/2015  . DEGENERATIVE DISC DISEASE 11/09/2009  . History of tobacco use 09/03/2009  . Obesity 07/08/2009  . HERNIATED LUMBAR DISC 05/14/2007  . DM2 (diabetes mellitus, type 2) ([place]) 01/23/2007  . HYPERCHOLESTEROLEMIA 01/23/2007  . Resistant hypertension 01/23/2007    Past Medical History: Past Medical History:  Diagnosis Date  . Back pain    herniated disc lower back  . Diabetes mellitus without complication ([place])   . Hypertension     Past Surgical History: Past Surgical History:  Procedure Laterality Date  . ABDOMINAL HYSTERECTOMY  Social History: Social History   Tobacco Use  . Smoking status: Former Smoker    Packs/day: 1.00    Types: Cigarettes    Start date: 04/26/2014    Last attempt to quit: 07/23/2015    Years since quitting: 2.9  . Smokeless tobacco: Never Used  . Tobacco comment: Started in her 29's with multiple quit attempts for about 1 year at a time.  Substance Use Topics  . Alcohol use: No    Alcohol/week: 0.0 oz  . Drug use: No   Additional social history: lives with daughter, son No alcohol, used to smoke off and on, denies drug use  Please also refer  to relevant sections of EMR.  Family History: Family History  Adopted: Yes  daughter has HTN  Allergies and Medications: Allergies  Allergen Reactions  . Amlodipine Swelling    Bilateral leg swelling with multiple doses including 2.5mg  daily.  Stopped therapy and edema improved.   . Augmentin [Amoxicillin-Pot Clavulanate] Diarrhea  . Clonidine Derivatives Other (See Comments)    Light-headedness, dizzy, extreme dry mouth  . Doxycycline Nausea And Vomiting  . Metformin Diarrhea   No current facility-administered medications on file prior to encounter.    Current Outpatient Medications on File Prior to Encounter  Medication Sig Dispense Refill  . acetaminophen (TYLENOL) 500 MG tablet Take 1,000 mg by mouth every 6 (six) hours as needed (for headaches).    Marland Kitchen aspirin (ASPIRIN CHILDRENS) 81 MG chewable tablet Chew 81 mg by mouth daily.      Marland Kitchen atorvastatin (LIPITOR) 40 MG tablet TAKE 1 TABLET BY MOUTH  DAILY AT 6PM 90 tablet 3  . calcium-vitamin D (OSCAL 500/200 D-3) 500-200 MG-UNIT tablet Take 2 tablets by mouth daily with breakfast. (Patient taking differently: Take 1 tablet by mouth daily with breakfast. ) 180 tablet 3  . carvedilol (COREG) 6.25 MG tablet Take 1 tablet (6.25 mg total) by mouth 2 (two) times daily. 180 tablet 3  . furosemide (LASIX) 40 MG tablet Take 1 tablet (40 mg total) by mouth daily. 90 tablet 0  . glucose blood (ONE TOUCH ULTRA TEST) test strip Check sugar 6 x daily 200 each 3  . hydrALAZINE (APRESOLINE) 50 MG tablet Take 1 tablet (50 mg total) by mouth 3 (three) times daily. 270 tablet 2  . insulin glargine (LANTUS) 100 UNIT/ML injection INJECT SUBCUTANEOUSLY 28  UNITS ONCE DAILY 30 mL 2  . Insulin Syringe-Needle U-100 (INSULIN SYRINGE 1CC/31GX5/16") 31G X 5/16" 1 ML MISC 1 each See admin instructions by Does not apply route. Use daily with insulin. 100 each 11  . ONE TOUCH LANCETS MISC 1 each by Does not apply route See admin instructions. Check blood sugar once  daily. 200 each 11  . polyethylene glycol (MIRALAX / GLYCOLAX) packet Take 17 g by mouth daily as needed for mild constipation. 14 each 0  . hydrochlorothiazide (HYDRODIURIL) 25 MG tablet Take 1 tablet (25 mg total) by mouth 2 (two) times daily. (Patient not taking: Reported on 07/01/2018) 180 tablet 2    Objective: BP (!) 164/56 (BP Location: Right Arm)   Pulse 72   Temp 99.3 F (37.4 C) (Oral)   Resp 12   Ht 5\' 3"  (1.6 m)   Wt 206 lb 5.6 oz (93.6 kg)   LMP  (LMP Unknown)   SpO2 99%   BMI 36.55 kg/m  Exam: General: Alert, NAD HEENT: NCAT, MMM, oropharynx nonerythematous  Cardiac: RRR no m/g/r Lungs: Clear bilaterally, no increased WOB  Abdomen:  soft, non-tender, non-distended, normoactive BS, no suprapubic tenderness  Msk: Moves all extremities spontaneously. Limited neck ROM due to pain, tender to palpation of paraspinal muscles and trapezius, no cervical spinous process tenderness. No tenderness to anterior chest.   Ext: Warm, dry, 2+ distal pulses, 2+ pitting edema bilaterally.   Skin: No rashes or skin discoloration noted.  Psych: appropriate affect and mood    Labs and Imaging: CBC BMET  Recent Labs  Lab 07/01/18 0249  WBC 12.6*  HGB 9.1*  HCT 29.0*  PLT 153   Recent Labs  Lab 07/01/18 0249  NA 140  K 4.1  CL 104  CO2 25  BUN 39*  CREATININE 2.45*  GLUCOSE 163*  CALCIUM 8.5*     Dg Chest 2 View  Result Date: 06/30/2018 CLINICAL DATA:  Left-sided chest pain. EXAM: CHEST - 2 VIEW COMPARISON:  None. FINDINGS: The heart size and mediastinal contours are within normal limits. Both lungs are clear. The visualized skeletal structures are unremarkable. IMPRESSION: No active cardiopulmonary disease. Electronically Signed   By: Dorise Bullion III M.D   On: 06/30/2018 19:12    Patriciaann Clan, DO 07/01/2018, 3:52 AM PGY-1, [place] Intern pager: [phone], text pages welcome  FPTS Upper-Level Resident Addendum  I have independently  interviewed and examined the patient. I have discussed the above with the original author and agree with their documentation. My edits for correction/addition/clarification are in blue. Please see also any attending notes.   Ralene Ok, MD PGY-3, [place] Service pager: [phone] (text pages welcome through [facility])

## 2018-07-01 NOTE — Care Management Note (Signed)
Case Management Note  Patient Details  Name: Kimberly Carlson MRN: [id number] Date of Birth: May 25, 1946  Subjective/Objective:      Pt in with renal failure. She is from home and [facility].              Action/Plan: Pt discharging home with self care. Pt has PCP, insurance and transportation home.   Expected Discharge Date:  07/01/18               Expected Discharge Plan:  Home/Self Care  In-House Referral:     Discharge planning Services     Post Acute Care Choice:    Choice offered to:     DME Arranged:    DME Agency:     HH Arranged:    HH Agency:     Status of Service:  Completed, signed off  If discussed at [employer] of Stay Meetings, dates discussed:    Additional Comments:  Pollie Friar, RN 07/01/2018, 3:18 PM

## 2018-07-01 NOTE — Progress Notes (Signed)
Patient arrived from ED around 0240 alert and oriented complaining of pain in chest in the breast area. She is on telemetry her SBP is running (200's) high gave her BP medication and it is responding now around 165, will continue to monitor.

## 2018-07-01 NOTE — ED Notes (Signed)
Attempted to call report-no answer 

## 2018-07-01 NOTE — Discharge Summary (Signed)
[facility] Discharge Summary  Patient name: Kimberly Carlson Medical record number: [id number] Date of birth: 07-06-1946 Age: 72 y.o. Gender: female Date of Admission: 06/30/2018  Date of Discharge: 07/01/2018 Admitting Physician: Dickie La, MD  Primary Care Provider: Steve Rattler, DO Consultants: None  Indication for Hospitalization: Acute Kidney Injury  Discharge Diagnoses/Problem List:  Acute on chronic kidney disease Hypertension Neck pain Asymptomatic bacteriuria DM Anemia 2/2 CKD  Disposition: DC home  Discharge Condition: Stable  Discharge Exam:  General: Alert and cooperative and appears to be in no acute distress HEENT: Neck non-tender without lymphadenopathy, masses or thyromegaly, mild tenderness cervical palpation position posteriorly. Cardio: Normal A1 and S2, no S3 or S4. RRR. No murmurs or rubs.   Pulm: Clear to auscultation bilaterally, no crackles, wheezing, or diminished breath sounds. Normal respiratory effort Abdomen: Bowel sounds normal. Abdomen soft and non-tender.  Extremities: 2+ LE edema bilaterally. Warm/ well perfused.  Strong radial and pedal pulses. Neuro: Cranial nerves grossly intact   Brief Hospital Course:  Kimberly Carlson is a 72 y.o. female who presented with a five day history of progressive neck pain radiating to her left breast. PMH is significant for resistant hypertension, type 2 diabetes mellitus, hyperlipidemia, previous tobacco use, DOE, normocytic anemia, degenerative disc disease, and chronic renal failure.  On presentation to the ED her vital signs were entirely within normal limits and she had no signs or symptoms of infection.  Due to concern for sepsis, blood cultures were drawn in addition to urine culture.  Her UA was remarkable for leukocyte esterase with white blood cells on microscopy so she was given 1 g of Rocephin in the ED.  Due to lack of symptoms this treatment was not continued.  Her  neck/chest pain was initially concerning for cardiogenic cause but cardiac work-up was unremarkable.  She was ultimately admitted to the hospital due to her AKI on CKD.  Admission labs showed a creatinine of 3.0 which is increased from her baseline of about 2.0.  She is given 500 cc bolus in the ED and given light fluid resuscitation overnight.  Her creatinine decreased to 2.5 in the morning.  In the morning of 8/6 she was feeling well with no new complaints and discharged home.  Her nephrologist was contacted before discharge to inform him patient's hospital stay.  He acknowledged the patient had good follow-up with nephrology and that they would also address this her anemia secondary to chronic kidney disease.   Issues for Follow Up:  1. Follow-up with your PCP to discuss his hospitalization for continued management of your hypertension, diabetes, hyperlipidemia and neck pain.  Please assess patient's volume status and the need for additional diuresis or holding of diuretics. Please verify that pt has not developed UTI sxs. 2. Follow-up with nephrology regarding the past management of your declining kidney function and your anemia secondary to chronic kidney disease.  Significant Procedures: none  Significant Labs and Imaging:  Recent Labs  Lab 06/30/18 1839 07/01/18 0249 07/01/18 0741  WBC 11.9* 12.6* 10.7*  HGB 9.4* 9.1* 8.1*  HCT 30.5* 29.0* 25.6*  PLT 171 153 146*   Recent Labs  Lab 06/30/18 1839 07/01/18 0249 07/01/18 0741  NA 140 140 142  K 4.3 4.1 3.5  CL 103 104 109  CO2 28 25 24   GLUCOSE 218* 163* 99  BUN 43* 39* 38*  CREATININE 3.00* 2.45* 2.43*  CALCIUM 9.1 8.5* 8.2*  PHOS  --  3.8  --  ALBUMIN  --  2.9*  --     Dg Chest 2 View  Result Date: 06/30/2018 CLINICAL DATA:  Left-sided chest pain. EXAM: CHEST - 2 VIEW COMPARISON:  None. FINDINGS: The heart size and mediastinal contours are within normal limits. Both lungs are clear. The visualized skeletal structures  are unremarkable. IMPRESSION: No active cardiopulmonary disease. Electronically Signed   By: Dorise Bullion III M.D   On: 06/30/2018 19:12     Results/Tests Pending at Time of Discharge:  2 blood cultures taken on 8/6 not yet read 1 urine culture taken on 8/6 not yet read  Discharge Medications:  Allergies as of 07/01/2018      Reactions   Amlodipine Swelling   Bilateral leg swelling with multiple doses including 2.5mg  daily.  Stopped therapy and edema improved.    Augmentin [amoxicillin-pot Clavulanate] Diarrhea   Clonidine Derivatives Other (See Comments)   Light-headedness, dizzy, extreme dry mouth   Doxycycline Nausea And Vomiting   Metformin Diarrhea      Medication List    STOP taking these medications   hydrochlorothiazide 25 MG tablet Commonly known as:  HYDRODIURIL     TAKE these medications   acetaminophen 500 MG tablet Commonly known as:  TYLENOL Take 1,000 mg by mouth every 6 (six) hours as needed (for headaches).   ASPIRIN CHILDRENS 81 MG chewable tablet Generic drug:  aspirin Chew 81 mg by mouth daily.   atorvastatin 40 MG tablet Commonly known as:  LIPITOR TAKE 1 TABLET BY MOUTH  DAILY AT 6PM   calcium-vitamin D 500-200 MG-UNIT tablet Commonly known as:  OSCAL 500/200 D-3 Take 2 tablets by mouth daily with breakfast. What changed:  how much to take   carvedilol 6.25 MG tablet Commonly known as:  COREG Take 1 tablet (6.25 mg total) by mouth 2 (two) times daily.   furosemide 40 MG tablet Commonly known as:  LASIX Take 1 tablet (40 mg total) by mouth daily.   glucose blood test strip Commonly known as:  ONE TOUCH ULTRA TEST Check sugar 6 x daily   hydrALAZINE 50 MG tablet Commonly known as:  APRESOLINE Take 1 tablet (50 mg total) by mouth 3 (three) times daily.   insulin glargine 100 UNIT/ML injection Commonly known as:  LANTUS INJECT SUBCUTANEOUSLY 28  UNITS ONCE DAILY   INSULIN SYRINGE 1CC/31GX5/16" 31G X 5/16" 1 ML Misc 1 each See admin  instructions by Does not apply route. Use daily with insulin.   lidocaine 5 % Commonly known as:  LIDODERM Place 1 patch onto the skin daily. Remove & Discard patch within 12 hours or as directed by MD Start taking on:  07/02/2018   ONE TOUCH LANCETS Misc 1 each by Does not apply route See admin instructions. Check blood sugar once daily.   polyethylene glycol packet Commonly known as:  MIRALAX / GLYCOLAX Take 17 g by mouth daily as needed for mild constipation.       Discharge Instructions: Please refer to Patient Instructions section of EMR for full details.  Patient was counseled important signs and symptoms that should prompt return to medical care, changes in medications, dietary instructions, activity restrictions, and follow up appointments.   Follow-Up Appointments: Follow-up Information    Steve Rattler, DO Follow up on 07/03/2018.   Specialty:  Family Medicine Why:  Your appointment is at 3:10pm.  [place] arrive at least ten minutes early to allow sufficient time for check in. Contact information: [address] [place] [address]  [phone]           Matilde Haymaker, MD 07/01/2018, 9:26 PM PGY-1, [place]

## 2018-07-02 ENCOUNTER — Ambulatory Visit (INDEPENDENT_AMBULATORY_CARE_PROVIDER_SITE_OTHER): Payer: Medicare Other

## 2018-07-02 ENCOUNTER — Encounter (HOSPITAL_COMMUNITY): Payer: Self-pay | Admitting: Family Medicine

## 2018-07-02 ENCOUNTER — Ambulatory Visit (HOSPITAL_COMMUNITY)
Admission: EM | Admit: 2018-07-02 | Discharge: 2018-07-02 | Disposition: A | Discharge status: Home / Self Care | Payer: Medicare Other | Attending: Family Medicine | Admitting: Family Medicine

## 2018-07-02 DIAGNOSIS — I10 Essential (primary) hypertension: Secondary | ICD-10-CM

## 2018-07-02 DIAGNOSIS — E1165 Type 2 diabetes mellitus with hyperglycemia: Secondary | ICD-10-CM

## 2018-07-02 DIAGNOSIS — M25431 Effusion, right wrist: Secondary | ICD-10-CM | POA: Diagnosis not present

## 2018-07-02 DIAGNOSIS — M25531 Pain in right wrist: Secondary | ICD-10-CM | POA: Diagnosis not present

## 2018-07-02 DIAGNOSIS — M19039 Primary osteoarthritis, unspecified wrist: Secondary | ICD-10-CM

## 2018-07-02 LAB — URINE CULTURE: Culture: NO GROWTH

## 2018-07-02 LAB — POCT I-STAT, CHEM 8
BUN: 35 mg/dL — ABNORMAL HIGH (ref 8–23)
Calcium, Ion: 1.21 mmol/L (ref 1.15–1.40)
Chloride: 106 mmol/L (ref 98–111)
Creatinine, Ser: 2.4 mg/dL — ABNORMAL HIGH (ref 0.44–1.00)
Glucose, Bld: 180 mg/dL — ABNORMAL HIGH (ref 70–99)
HCT: 28 % — ABNORMAL LOW (ref 36.0–46.0)
Hemoglobin: 9.5 g/dL — ABNORMAL LOW (ref 12.0–15.0)
Potassium: 4.1 mmol/L (ref 3.5–5.1)
Sodium: 140 mmol/L (ref 135–145)
TCO2: 23 mmol/L (ref 22–32)

## 2018-07-02 MED ORDER — PREDNISONE 20 MG PO TABS: ORAL_TABLET | ORAL | 0 refills | Status: DC

## 2018-07-02 NOTE — ED Triage Notes (Signed)
Pt sts right wrist pain and swelling; pt sts neck pain and left side pain; pt sts some fever; pt recently hospitalized

## 2018-07-02 NOTE — ED Provider Notes (Signed)
[facility]   [id number] 07/02/18 Arrival Time: 1608   SUBJECTIVE:  Kimberly Carlson is a 72 y.o. female who presents to the urgent care with complaint of right wrist pain.  This pain is been present for a week or so.  Patient was in the hospital for worsening renal insufficiency recently and was discharged yesterday.  (She was having neck and wrist pain while admitted, but these were not evaluated and thought better evaluated as an outpatient)  She has hypertension that is been very difficult to control as well as diabetes.  She states that she had a fever last night.  She is having difficulty flexing the wrist or even touching it.  No leg or ankle edema.    Past Medical History:  Diagnosis Date  . Back pain    herniated disc lower back  . Diabetes mellitus without complication ([place])   . Hypertension    Family History  Adopted: Yes   Social History   Socioeconomic History  . Marital status: Widowed    Spouse name: Not on file  . Number of children: Not on file  . Years of education: Not on file  . Highest education level: Not on file  Occupational History  . Not on file  Social Needs  . Financial resource strain: Not on file  . Food insecurity:    Worry: Not on file    Inability: Not on file  . Transportation needs:    Medical: Not on file    Non-medical: Not on file  Tobacco Use  . Smoking status: Former Smoker    Packs/day: 1.00    Types: Cigarettes    Start date: 04/26/2014    Last attempt to quit: 07/23/2015    Years since quitting: 2.9  . Smokeless tobacco: Never Used  . Tobacco comment: Started in her 59's with multiple quit attempts for about 1 year at a time.  Substance and Sexual Activity  . Alcohol use: No    Alcohol/week: 0.0 oz  . Drug use: No  . Sexual activity: Never    Birth control/protection: None  Lifestyle  . Physical activity:    Days per week: Not on file    Minutes per session: Not on file  . Stress: Not on file    Relationships  . Social connections:    Talks on phone: Not on file    Gets together: Not on file    Attends religious service: Not on file    Active member of club or organization: Not on file    Attends meetings of clubs or organizations: Not on file    Relationship status: Not on file  . Intimate partner violence:    Fear of current or ex partner: Not on file    Emotionally abused: Not on file    Physically abused: Not on file    Forced sexual activity: Not on file  Other Topics Concern  . Not on file  Social History Narrative  . Not on file   No outpatient medications have been marked as taking for the 07/02/18 encounter [facility] Encounter).   Allergies  Allergen Reactions  . Amlodipine Swelling    Bilateral leg swelling with multiple doses including 2.5mg  daily.  Stopped therapy and edema improved.   . Augmentin [Amoxicillin-Pot Clavulanate] Diarrhea  . Clonidine Derivatives Other (See Comments)    Light-headedness, dizzy, extreme dry mouth  . Doxycycline Nausea And Vomiting  . Metformin Diarrhea      ROS: As  per HPI, remainder of ROS negative.   OBJECTIVE:   Vitals:   07/02/18 1652  BP: (!) 240/85  Pulse: (!) 103  Resp: 18  Temp: 98.6 F (37 C)  TempSrc: Oral  SpO2: 97%     General appearance: alert; no distress Eyes: PERRL; EOMI; conjunctiva normal HENT: normocephalic; atraumatic;  oral mucosa normal Neck: supple Lungs: clear to auscultation bilaterally Heart: regular rate and rhythm Abdomen: soft, non-tender; bowel sounds normal; no masses or organomegaly; no guarding or rebound tenderness Back: no CVA tenderness Extremities: no cyanosis but there is 1-2+ ankle edema; symmetrical with no gross deformities;  Mild dorsal right wrist swelling with tenderness. Skin: warm and dry Neurologic: normal gait; grossly normal Psychological: alert and cooperative; normal mood and affect      Labs:  Results for orders placed or performed during the  hospital encounter of 07/02/18  I-STAT, chem 8  Result Value Ref Range   Sodium 140 135 - 145 mmol/L   Potassium 4.1 3.5 - 5.1 mmol/L   Chloride 106 98 - 111 mmol/L   BUN 35 (H) 8 - 23 mg/dL   Creatinine, Ser 2.40 (H) 0.44 - 1.00 mg/dL   Glucose, Bld 180 (H) 70 - 99 mg/dL   Calcium, Ion 1.21 1.15 - 1.40 mmol/L   TCO2 23 22 - 32 mmol/L   Hemoglobin 9.5 (L) 12.0 - 15.0 g/dL   HCT 28.0 (L) 36.0 - 46.0 %    Labs Reviewed  POCT I-STAT, CHEM 8 - Abnormal; Notable for the following components:      Result Value   BUN 35 (*)    Creatinine, Ser 2.40 (*)    Glucose, Bld 180 (*)    Hemoglobin 9.5 (*)    HCT 28.0 (*)    All other components within normal limits    No results found.     ASSESSMENT & PLAN:  1. Wrist arthritis   This appears to be gout causing the arthritis in the wrist.  Start the prednisone tonight taking two the first dose, then one tablet twice a day.  Since prednisone raises the blood sugar, you need to increase the Lantus to 40 units daily while taking the prednisone.  Please see your kidney doctor later this week to monitor the kidney function  Meds ordered this encounter  Medications  . predniSONE (DELTASONE) 20 MG tablet    Sig: Two daily with food    Dispense:  10 tablet    Refill:  0    Reviewed expectations re: course of current medical issues. Questions answered. Outlined signs and symptoms indicating need for more acute intervention. Patient verbalized understanding. After Visit Summary given.       Robyn Haber, MD 07/02/18 [phone]

## 2018-07-02 NOTE — Discharge Instructions (Addendum)
This appears to be gout causing the arthritis in the wrist.  Start the prednisone tonight taking two the first dose, then one tablet twice a day.  Since prednisone raises the blood sugar, you need to increase the Lantus to 40 units daily while taking the prednisone.  Please see your kidney doctor later this week to monitor the kidney function

## 2018-07-03 ENCOUNTER — Encounter: Payer: Self-pay | Admitting: Family Medicine

## 2018-07-03 ENCOUNTER — Ambulatory Visit (INDEPENDENT_AMBULATORY_CARE_PROVIDER_SITE_OTHER): Payer: Medicare Other | Admitting: Family Medicine

## 2018-07-03 ENCOUNTER — Other Ambulatory Visit: Payer: Self-pay

## 2018-07-03 VITALS — BP 198/74 | HR 80 | Temp 98.1°F | Wt 203.0 lb

## 2018-07-03 DIAGNOSIS — I10 Essential (primary) hypertension: Secondary | ICD-10-CM | POA: Diagnosis not present

## 2018-07-03 DIAGNOSIS — E1165 Type 2 diabetes mellitus with hyperglycemia: Secondary | ICD-10-CM | POA: Diagnosis not present

## 2018-07-03 DIAGNOSIS — N189 Chronic kidney disease, unspecified: Secondary | ICD-10-CM

## 2018-07-03 DIAGNOSIS — N183 Chronic kidney disease, stage 3 unspecified: Secondary | ICD-10-CM

## 2018-07-03 DIAGNOSIS — Z794 Long term (current) use of insulin: Secondary | ICD-10-CM | POA: Diagnosis not present

## 2018-07-03 DIAGNOSIS — N179 Acute kidney failure, unspecified: Secondary | ICD-10-CM

## 2018-07-03 DIAGNOSIS — M10331 Gout due to renal impairment, right wrist: Secondary | ICD-10-CM | POA: Diagnosis not present

## 2018-07-03 DIAGNOSIS — E1122 Type 2 diabetes mellitus with diabetic chronic kidney disease: Secondary | ICD-10-CM | POA: Diagnosis not present

## 2018-07-03 MED ORDER — INSULIN GLARGINE 100 UNIT/ML ~~LOC~~ SOLN: SUBCUTANEOUS | 2 refills | Status: DC

## 2018-07-03 MED ORDER — CARVEDILOL 6.25 MG PO TABS: 12.5000 mg | ORAL_TABLET | Freq: Two times a day (BID) | ORAL | 3 refills | Status: DC

## 2018-07-03 MED ORDER — CARVEDILOL 6.25 MG PO TABS: 25.0000 mg | ORAL_TABLET | Freq: Two times a day (BID) | ORAL | 3 refills | Status: DC

## 2018-07-03 MED ORDER — HYDRALAZINE HCL 50 MG PO TABS: 100.0000 mg | ORAL_TABLET | Freq: Three times a day (TID) | ORAL | 2 refills | Status: DC

## 2018-07-03 NOTE — Progress Notes (Signed)
    Subjective:    Patient ID: Kimberly Carlson, female    DOB: 1946/07/12, 72 y.o.   MRN: [id number]   CC: hospital follow up   HPI: here for hospital follow up, she was treated for AKI with fluids. Initially was told she had UTI and got some antibiotics in ED but these were not continued and patient had no growth on urine culture. She denies any urinary symptoms at this time. She states she follows with kidney doctor closely and has upcoming appointment next month. She states after she was discharged form hospital her right hand became swollen, red, and hot. She went to urgent care and was told she had a gout flare and was put on prednisone. She has been taking prednisone for 2 days now with improvement. She has increased lantus as instructed by UC doctor due to prednisone raising sugars. She is taking 40 units daily lantus. She reports her BP is high and hard to control. She currently has high BP in office but denies any symptoms of headache, vision changes, chest pain, SOB. She overall feels well aside from her right hand pain.   Smoking status reviewed- former smoker, quit 3 years ago  Review of Systems- see HPI   Objective:  BP (!) 198/74   Pulse 80   Temp 98.1 F (36.7 C) (Oral)   Wt 203 lb (92.1 kg)   LMP  (LMP Unknown)   SpO2 96%   BMI 35.96 kg/m  Vitals and nursing note reviewed  General: well nourished, in no acute distress HEENT: normocephalic, MMM Cardiac: RRR, clear S1 and S2, 3/6 diastolic murmur appreciated. +2 radial pulses and DP pulses bilaterally  Respiratory: clear to auscultation bilaterally, no increased work of breathing Extremities: right wrist hot, red, swollen compared to left. Limited ROM. No cyanosis in extremities  Skin: warm and dry, no rashes noted Neuro: alert and oriented, no focal deficits   Assessment & Plan:    DM2 (diabetes mellitus, type 2) (HCC)  Poorly controlled. A1C in hospital 9. At this time patient on prednisone for gout. Asked  her to continue lantus 40 units daily even after steroids are done. Follow up 1 month to further assess and adjust regimen.   Resistant hypertension  BP elevated in office to 198/74. Patient without any symptoms, no signs of hypertensive emergency. She feels well. She is taking coreg 25 mg BID and hydral 50 TID. After much discussion, patient cannot take amlodipine or clonidine due to allergies. We are limited in other BP medications due to CKD. Will increase hydral to 100 mg TID and have her follow up Monday for nurse BP visit. ED precautions given. Patient verbalized understanding and agreement with plan.   Acute on chronic renal failure (HCC)  Cr elevated during recent hospitalization, improved with fluids overnight. Yesterday at [facility] had labs which indicated stable Cr. Will not repeat today. Discussed importance of controlling BP and DM to preserve kidney function. Follow up with nephrologist as scheduled.  Acute gout due to renal impairment involving right wrist  Appears to have initial acute gout flare in her right wrist. She is on steroid dose pack. Asked her to complete this follow up if symptoms do not improve. The patient indicates understanding of these issues and agrees with the plan.     Return in about 4 weeks (around 07/31/2018).   Lucila Maine, DO Family Medicine Resident PGY-2

## 2018-07-03 NOTE — Patient Instructions (Addendum)
   Good to meet you today!   Please increase hydralazine to 100 mg three times a day.  Please continue coreg 25 twice a day.  Please take lantus 40 units daily even after prednisone is complete.  I will see you back to recheck blood pressure next week.  If you have questions or concerns please do not hesitate to call at [phone].  Lucila Maine, DO PGY-2, [facility] 07/03/2018 3:47 PM

## 2018-07-04 DIAGNOSIS — M10331 Gout due to renal impairment, right wrist: Secondary | ICD-10-CM | POA: Insufficient documentation

## 2018-07-04 NOTE — Assessment & Plan Note (Signed)
  Cr elevated during recent hospitalization, improved with fluids overnight. Yesterday at [facility] had labs which indicated stable Cr. Will not repeat today. Discussed importance of controlling BP and DM to preserve kidney function. Follow up with nephrologist as scheduled.

## 2018-07-04 NOTE — Assessment & Plan Note (Signed)
  BP elevated in office to 198/74. Patient without any symptoms, no signs of hypertensive emergency. She feels well. She is taking coreg 25 mg BID and hydral 50 TID. After much discussion, patient cannot take amlodipine or clonidine due to allergies. We are limited in other BP medications due to CKD. Will increase hydral to 100 mg TID and have her follow up Monday for nurse BP visit. ED precautions given. Patient verbalized understanding and agreement with plan.

## 2018-07-04 NOTE — Assessment & Plan Note (Signed)
  Poorly controlled. A1C in hospital 9. At this time patient on prednisone for gout. Asked her to continue lantus 40 units daily even after steroids are done. Follow up 1 month to further assess and adjust regimen.

## 2018-07-04 NOTE — Assessment & Plan Note (Signed)
  Appears to have initial acute gout flare in her right wrist. She is on steroid dose pack. Asked her to complete this follow up if symptoms do not improve. The patient indicates understanding of these issues and agrees with the plan.

## 2018-07-06 LAB — CULTURE, BLOOD (ROUTINE X 2)
Culture: NO GROWTH
Culture: NO GROWTH
Special Requests: ADEQUATE

## 2018-07-07 ENCOUNTER — Other Ambulatory Visit: Payer: Self-pay

## 2018-07-07 ENCOUNTER — Ambulatory Visit (INDEPENDENT_AMBULATORY_CARE_PROVIDER_SITE_OTHER): Payer: Medicare Other

## 2018-07-07 ENCOUNTER — Ambulatory Visit (HOSPITAL_COMMUNITY)
Admission: RE | Admit: 2018-07-07 | Discharge: 2018-07-07 | Disposition: A | Discharge status: Home / Self Care | Payer: Medicare Other | Source: Ambulatory Visit | Attending: Nephrology | Admitting: Nephrology

## 2018-07-07 VITALS — BP 179/78 | HR 72 | Temp 98.3°F | Ht 63.0 in | Wt 203.0 lb

## 2018-07-07 VITALS — BP 230/104 | HR 63

## 2018-07-07 DIAGNOSIS — N189 Chronic kidney disease, unspecified: Secondary | ICD-10-CM | POA: Diagnosis not present

## 2018-07-07 DIAGNOSIS — N179 Acute kidney failure, unspecified: Secondary | ICD-10-CM | POA: Insufficient documentation

## 2018-07-07 DIAGNOSIS — I10 Essential (primary) hypertension: Secondary | ICD-10-CM

## 2018-07-07 LAB — POCT HEMOGLOBIN-HEMACUE: Hemoglobin: 10.5 g/dL — ABNORMAL LOW (ref 12.0–15.0)

## 2018-07-07 MED ORDER — EPOETIN ALFA 10000 UNIT/ML IJ SOLN
10000.0000 [IU] | INTRAMUSCULAR | Status: DC
  Administered 2018-07-07: 10000 [IU] via SUBCUTANEOUS

## 2018-07-07 MED ORDER — HYDRALAZINE HCL 50 MG PO TABS: 100.0000 mg | ORAL_TABLET | Freq: Three times a day (TID) | ORAL | 2 refills | Status: DC

## 2018-07-07 MED ORDER — EPOETIN ALFA 10000 UNIT/ML IJ SOLN
INTRAMUSCULAR | Status: AC
  Administered 2018-07-07: 10000 [IU] via SUBCUTANEOUS
  Filled 2018-07-07: qty 1

## 2018-07-07 NOTE — Progress Notes (Signed)
   Patient in to nurse visit for BP check. Patient states that she was wrong about her BP meds at visit with PCP on 07/03/18. She was still only taking Carvedilol 12.5 mg BID, not 25 mg BID. She started the 25 mg BID on 07/04/18. Patient stated she has not started Hydralazine 100 mg TID because pharmacy never got the prescription. She has continued to take this BID.  Patient BP x 2 was 230/104. Patient denies headache, chest pain, SOB, dizziness or visual disturbance.  Spoke with preceptor, Dr Andria Frames. Advised to start the Hydralazine 100 mg TID in addition to the Carvedilol and return to clinic in a couple of days. New prescription for Hydralazine sent to pharmacy. Appt made with PCP for Wednesday. Patient will pick up new prescription for Hydralazine and begin the TID dosing today. ED precautions given.  Patient also requests a handicap form be filled out. Placed in PCP mailbox.  Danley Danker, RN [facility] RN)

## 2018-07-09 ENCOUNTER — Ambulatory Visit (INDEPENDENT_AMBULATORY_CARE_PROVIDER_SITE_OTHER): Payer: Medicare Other | Admitting: Family Medicine

## 2018-07-09 ENCOUNTER — Other Ambulatory Visit: Payer: Self-pay

## 2018-07-09 ENCOUNTER — Encounter: Payer: Self-pay | Admitting: Family Medicine

## 2018-07-09 VITALS — BP 178/70 | HR 71 | Temp 98.3°F | Wt 200.0 lb

## 2018-07-09 DIAGNOSIS — M25531 Pain in right wrist: Secondary | ICD-10-CM | POA: Diagnosis not present

## 2018-07-09 DIAGNOSIS — I10 Essential (primary) hypertension: Secondary | ICD-10-CM

## 2018-07-09 MED ORDER — DICLOFENAC SODIUM 1 % TD GEL: 4.0000 g | Freq: Four times a day (QID) | TRANSDERMAL | 3 refills | Status: DC

## 2018-07-09 NOTE — Progress Notes (Signed)
    Subjective:    Patient ID: Kimberly Carlson, female    DOB: 1946/06/23, 72 y.o.   MRN: [id number]   CC: recheck BP  Had nurse BP check 2 days ago and BP was elevated- patient was taking coreg 25 BID and hydral 50 BID. She is always asymptomatic despite high BPs in clinic.  Was instructed to increase hydral to 100 TID and continue coreg 25 BID which she states she is doing.  Today she is taking hydral and coreg as prescribed. Has been doing that for 2 days. Denies CP, blurred vision, headaches, SOB. Sees nephro 9/25.  C/o persistent wrist pain- was prescribed steroids for gout flare in wrist. Finished these yesterday. Pain initially improved then worsened again today. She also has pain in her neck with moving neck. She had x-ray on 8/7 that showed arthritis in her thumb joint.  Smoking status reviewed - former smoker, quit 2016.  Review of Systems- see HPI   Objective:  BP (!) 178/70   Pulse 71   Temp 98.3 F (36.8 C) (Oral)   Wt 200 lb (90.7 kg)   LMP  (LMP Unknown)   SpO2 96%   BMI 35.43 kg/m  Vitals and nursing note reviewed  General: well nourished, in no acute distress HEENT: normocephalic, MMM Neck: supple, tender to palpation of paraspinal musculature. Restricted AROM in rotation, flexion, extension.  Cardiac: RRR, clear S1 and S2, no murmurs, rubs, or gallops Respiratory: clear to auscultation bilaterally, no increased work of breathing Extremities: no edema or cyanosis. +finkelstein's on right. No joint swelling or erythema over wrist joints bilaterally.  Skin: warm and dry, no rashes noted Neuro: alert and oriented, no focal deficits  Assessment & Plan:    Resistant hypertension  BP improved today but still elevated. Patient remains asymptomatic. At this point limited by allergies and kidney function in altering regimen. Continue coreg 25 BID, continue hydralazine 100 TID. Will repeat BP nurse visit in 5 days. If persistently elevated could consider alpha  blockade vs spiro. Could consider increasing hydral dosing to q4 hours however unsure if patient would be able to dose that frequently. Would want to discuss with nephro any further changes. ED precautions given for headache,chest pain, SOB, vision changes. Patient verbalized understanding and agreement with plan.   Right wrist pain  Initially treated as gout flare by UC provider with improvement however pain returned after completion of steroids. X-ray review indicates arthritis at [facility] joint. Had a long discussion with patient about conservative treatments vs joint injections. Decided to hold off on repeat course of steroids given diabetes. Will prescribe topical diclofenac. Ice joint. Follow up as needed if worsens, can refer to sports med for joint injection if pain gets bad enough. Patient verbalized understanding and agreement with plan.     Return in about 5 days (around 07/14/2018).   Lucila Maine, DO Family Medicine Resident PGY-3

## 2018-07-09 NOTE — Patient Instructions (Signed)
Good to see you today. Please continue hydralazine 100 mg three times a day Please continue coreg 25 mg twice a day.  Please ice your thumb and start using diclofenac (NSAID gel) four times a day. If you're in a lot of pain please call me and we'll do another round of steroids. If nothing else helps we can refer you to ortho for joint injection.  Please do gentle ROM exercises of your neck  We'll see you Monday to repeat Blood Pressure.  If you have questions or concerns please do not hesitate to call at [phone].  Lucila Maine, DO PGY-3, [facility] 07/09/2018 2:29 PM   Osteoarthritis Osteoarthritis is a type of arthritis that affects tissue that covers the ends of bones in joints (cartilage). Cartilage acts as a cushion between the bones and helps them move smoothly. Osteoarthritis results when cartilage in the joints gets worn down. Osteoarthritis is sometimes called "wear and tear" arthritis. Osteoarthritis is the most common form of arthritis. It often occurs in older people. It is a condition that gets worse over time (a progressive condition). Joints that are most often affected by this condition are in:  Fingers.  Toes.  Hips.  Knees.  Spine, including neck and lower back.  What are the causes? This condition is caused by age-related wearing down of cartilage that covers the ends of bones. What increases the risk? The following factors may make you more likely to develop this condition:  Older age.  Being overweight or obese.  Overuse of joints, such as in athletes.  Past injury of a joint.  Past surgery on a joint.  Family history of osteoarthritis.  What are the signs or symptoms? The main symptoms of this condition are pain, swelling, and stiffness in the joint. The joint may lose its shape over time. Small pieces of bone or cartilage may break off and float inside of the joint, which may cause more pain and damage to the joint. Small  deposits of bone (osteophytes) may grow on the edges of the joint. Other symptoms may include:  A grating or scraping feeling inside the joint when you move it.  Popping or creaking sounds when you move.  Symptoms may affect one or more joints. Osteoarthritis in a major joint, such as your knee or hip, can make it painful to walk or exercise. If you have osteoarthritis in your hands, you might not be able to grip items, twist your hand, or control small movements of your hands and fingers (fine motor skills). How is this diagnosed? This condition may be diagnosed based on:  Your medical history.  A physical exam.  Your symptoms.  X-rays of the affected joint(s).  Blood tests to rule out other types of arthritis.  How is this treated? There is no cure for this condition, but treatment can help to control pain and improve joint function. Treatment plans may include:  A prescribed exercise program that allows for rest and joint relief. You may work with a physical therapist.  A weight control plan.  Pain relief techniques, such as: ? Applying heat and cold to the joint. ? Electric pulses delivered to nerve endings under the skin (transcutaneous electrical nerve stimulation, or TENS). ? Massage. ? Certain nutritional supplements.  NSAIDs or prescription medicines to help relieve pain.  Medicine to help relieve pain and inflammation (corticosteroids). This can be given by mouth (orally) or as an injection.  Assistive devices, such as a brace, wrap, splint, specialized  glove, or cane.  Surgery, such as: ? An osteotomy. This is done to reposition the bones and relieve pain or to remove loose pieces of bone and cartilage. ? Joint replacement surgery. You may need this surgery if you have very bad (advanced) osteoarthritis.  Follow these instructions at home: Activity  Rest your affected joints as directed by your health care provider.  Do not drive or use heavy machinery while  taking prescription pain medicine.  Exercise as directed. Your health care provider or physical therapist may recommend specific types of exercise, such as: ? Strengthening exercises. These are done to strengthen the muscles that support joints that are affected by arthritis. They can be performed with weights or with exercise bands to add resistance. ? Aerobic activities. These are exercises, such as brisk walking or water aerobics, that get your heart pumping. ? Range-of-motion activities. These keep your joints easy to move. ? Balance and agility exercises. Managing pain, stiffness, and swelling  If directed, apply heat to the affected area as often as told by your health care provider. Use the heat source that your health care provider recommends, such as a moist heat pack or a heating pad. ? If you have a removable assistive device, remove it as told by your health care provider. ? Place a towel between your skin and the heat source. If your health care provider tells you to keep the assistive device on while you apply heat, place a towel between the assistive device and the heat source. ? Leave the heat on for 20-30 minutes. ? Remove the heat if your skin turns bright red. This is especially important if you are unable to feel pain, heat, or cold. You may have a greater risk of getting burned.  If directed, put ice on the affected joint: ? If you have a removable assistive device, remove it as told by your health care provider. ? Put ice in a plastic bag. ? Place a towel between your skin and the bag. If your health care provider tells you to keep the assistive device on during icing, place a towel between the assistive device and the bag. ? Leave the ice on for 20 minutes, 2-3 times a day. General instructions  Take over-the-counter and prescription medicines only as told by your health care provider.  Maintain a healthy weight. Follow instructions from your health care provider for  weight control. These may include dietary restrictions.  Do not use any products that contain nicotine or tobacco, such as cigarettes and e-cigarettes. These can delay bone healing. If you need help quitting, ask your health care provider.  Use assistive devices as directed by your health care provider.  Keep all follow-up visits as told by your health care provider. This is important. Where to find more information:  [employer] of Arthritis and Musculoskeletal and Skin Diseases: www.niams.SouthExposed.es  [employer] on Aging: http://kim-miller.com/  American College of Rheumatology: www.rheumatology.org Contact a health care provider if:  Your skin turns red.  You develop a rash.  You have pain that gets worse.  You have a fever along with joint or muscle aches. Get help right away if:  You lose a lot of weight.  You suddenly lose your appetite.  You have night sweats. Summary  Osteoarthritis is a type of arthritis that affects tissue covering the ends of bones in joints (cartilage).  This condition is caused by age-related wearing down of cartilage that covers the ends of bones.  The main symptom  of this condition is pain, swelling, and stiffness in the joint.  There is no cure for this condition, but treatment can help to control pain and improve joint function. This information is not intended to replace advice given to you by your health care provider. Make sure you discuss any questions you have with your health care provider. Document Released: 11/12/2005 Document Revised: 07/16/2016 Document Reviewed: 07/16/2016 Elsevier Interactive Patient Education  [employer].

## 2018-07-11 DIAGNOSIS — M25531 Pain in right wrist: Secondary | ICD-10-CM | POA: Insufficient documentation

## 2018-07-11 NOTE — Assessment & Plan Note (Signed)
  Initially treated as gout flare by [facility] provider with improvement however pain returned after completion of steroids. X-ray review indicates arthritis at [facility] joint. Had a long discussion with patient about conservative treatments vs joint injections. Decided to hold off on repeat course of steroids given diabetes. Will prescribe topical diclofenac. Ice joint. Follow up as needed if worsens, can refer to sports med for joint injection if pain gets bad enough. Patient verbalized understanding and agreement with plan.

## 2018-07-11 NOTE — Assessment & Plan Note (Signed)
  BP improved today but still elevated. Patient remains asymptomatic. At this point limited by allergies and kidney function in altering regimen. Continue coreg 25 BID, continue hydralazine 100 TID. Will repeat BP nurse visit in 5 days. If persistently elevated could consider alpha blockade vs spiro. Could consider increasing hydral dosing to q4 hours however unsure if patient would be able to dose that frequently. Would want to discuss with nephro any further changes. ED precautions given for headache,chest pain, SOB, vision changes. Patient verbalized understanding and agreement with plan.

## 2018-07-14 ENCOUNTER — Ambulatory Visit (INDEPENDENT_AMBULATORY_CARE_PROVIDER_SITE_OTHER): Payer: Medicare Other

## 2018-07-14 ENCOUNTER — Ambulatory Visit (HOSPITAL_COMMUNITY)
Admission: RE | Admit: 2018-07-14 | Discharge: 2018-07-14 | Disposition: A | Discharge status: Home / Self Care | Payer: Medicare Other | Source: Ambulatory Visit | Attending: Nephrology | Admitting: Nephrology

## 2018-07-14 VITALS — BP 170/68 | HR 76

## 2018-07-14 VITALS — BP 165/69 | HR 73 | Temp 98.8°F

## 2018-07-14 DIAGNOSIS — N189 Chronic kidney disease, unspecified: Secondary | ICD-10-CM

## 2018-07-14 DIAGNOSIS — N179 Acute kidney failure, unspecified: Secondary | ICD-10-CM

## 2018-07-14 DIAGNOSIS — N183 Chronic kidney disease, stage 3 (moderate): Secondary | ICD-10-CM | POA: Diagnosis not present

## 2018-07-14 DIAGNOSIS — D631 Anemia in chronic kidney disease: Secondary | ICD-10-CM | POA: Insufficient documentation

## 2018-07-14 DIAGNOSIS — I10 Essential (primary) hypertension: Secondary | ICD-10-CM

## 2018-07-14 LAB — POCT HEMOGLOBIN-HEMACUE: Hemoglobin: 10.5 g/dL — ABNORMAL LOW (ref 12.0–15.0)

## 2018-07-14 MED ORDER — EPOETIN ALFA 10000 UNIT/ML IJ SOLN
INTRAMUSCULAR | Status: AC
  Filled 2018-07-14: qty 1

## 2018-07-14 MED ORDER — EPOETIN ALFA 10000 UNIT/ML IJ SOLN
10000.0000 [IU] | INTRAMUSCULAR | Status: DC
  Administered 2018-07-14: 10000 [IU] via SUBCUTANEOUS

## 2018-07-14 NOTE — Progress Notes (Signed)
   Patient in to nurse visit for BP check. States after office visit of 07/09/18 she got dizzy and nauseated on the Hydralazine 100 mg TID. She dropped it back to 50 mg TID and the symptoms improved.  BP today is 170/68, pulse is 76. Patient also c/o back pain. Made appt for tomorrow to discuss BP and back pain.  Patient had left handicap form at 07/07/18 OV with RN and inquired about status. Form still in PCP mailbox for completion.  Danley Danker, RN [facility] RN)

## 2018-07-15 ENCOUNTER — Other Ambulatory Visit: Payer: Self-pay

## 2018-07-15 ENCOUNTER — Ambulatory Visit (INDEPENDENT_AMBULATORY_CARE_PROVIDER_SITE_OTHER): Payer: Medicare Other | Admitting: Family Medicine

## 2018-07-15 VITALS — BP 170/64 | HR 84 | Temp 98.2°F | Wt 202.0 lb

## 2018-07-15 DIAGNOSIS — M25551 Pain in right hip: Secondary | ICD-10-CM | POA: Insufficient documentation

## 2018-07-15 DIAGNOSIS — M25552 Pain in left hip: Secondary | ICD-10-CM | POA: Diagnosis not present

## 2018-07-15 DIAGNOSIS — I10 Essential (primary) hypertension: Secondary | ICD-10-CM | POA: Diagnosis not present

## 2018-07-15 NOTE — Assessment & Plan Note (Addendum)
Blood pressure is stable but still elevated. Continue coreg 25mg  BID and hydralazine 50mg  TID since was not able to tolerate higher dose of hydralazine. Discussed with pharmacy resident. Retrial HCTZ 25mg  qd and recheck BP in 2 weeks.

## 2018-07-15 NOTE — Patient Instructions (Addendum)
Start HCTZ 25mg  daily back and come in for BP recheck in 2 weeks.  Hip Pain The hip is the joint between the upper legs and the lower pelvis. The bones, cartilage, tendons, and muscles of your hip joint support your body and allow you to move around. Hip pain can range from a minor ache to severe pain in one or both of your hips. The pain may be felt on the inside of the hip joint near the groin, or the outside near the buttocks and upper thigh. You may also have swelling or stiffness. Follow these instructions at home: Managing pain, stiffness, and swelling  If directed, apply ice to the injured area. ? Put ice in a plastic bag. ? Place a towel between your skin and the bag. ? Leave the ice on for 20 minutes, 2-3 times a day  Sleep with a pillow between your legs on your most comfortable side.  Avoid any activities that cause pain. General instructions  Take over-the-counter and prescription medicines only as told by your health care provider.  Do any exercises as told by your health care provider.  Record the following: ? How often you have hip pain. ? The location of your pain. ? What the pain feels like. ? What makes the pain worse.  Keep all follow-up visits as told by your health care provider. This is important. Contact a health care provider if:  You cannot put weight on your leg.  Your pain or swelling continues or gets worse after one week.  It gets harder to walk.  You have a fever. Get help right away if:  You fall.  You have a sudden increase in pain and swelling in your hip.  Your hip is red or swollen or very tender to touch. Summary  Hip pain can range from a minor ache to severe pain in one or both of your hips.  The pain may be felt on the inside of the hip joint near the groin, or the outside near the buttocks and upper thigh.  Avoid any activities that cause pain.  Record how often you have hip pain, the location of the pain, what makes it  worse and what it feels like. This information is not intended to replace advice given to you by your health care provider. Make sure you discuss any questions you have with your health care provider. Document Released: 05/02/2010 Document Revised: 10/15/2016 Document Reviewed: 10/15/2016 Elsevier Interactive Patient Education  2018 [place]. Hip Exercises Ask your health care provider which exercises are safe for you. Do exercises exactly as told by your health care provider and adjust them as directed. It is normal to feel mild stretching, pulling, tightness, or discomfort as you do these exercises, but you should stop right away if you feel sudden pain or your pain gets worse.Do not begin these exercises until told by your health care provider. STRETCHING AND RANGE OF MOTION EXERCISES These exercises warm up your muscles and joints and improve the movement and flexibility of your hip. These exercises also help to relieve pain, numbness, and tingling. Exercise A: Hamstrings, Supine  1. Lie on your back. 2. Loop a belt or towel over the ball of your left / rightfoot. The ball of your foot is on the walking surface, right under your toes. 3. Straighten your left / rightknee and slowly pull on the belt to raise your leg. ? Do not let your left / right knee bend while you do this. ?  Keep your other leg flat on the floor. ? Raise the left / right leg until you feel a gentle stretch behind your left / right knee or thigh. 4. Hold this position for __________ seconds. 5. Slowly return your leg to the starting position. Repeat __________ times. Complete this stretch __________ times a day. Exercise B: Hip Rotators  1. Lie on your back on a firm surface. 2. Hold your left / right knee with your left / right hand. Hold your ankle with your other hand. 3. Gently pull your left / right knee and rotate your lower leg toward your other shoulder. ? Pull until you feel a stretch in your  buttocks. ? Keep your hips and shoulders firmly planted while you do this stretch. 4. Hold this position for __________ seconds. Repeat __________ times. Complete this stretch __________ times a day. Exercise C: V-Sit (Hamstrings and Adductors)  1. Sit on the floor with your legs extended in a large "V" shape. Keep your knees straight during this exercise. 2. Start with your head and chest upright, then bend at your waist to reach for your left foot (position A). You should feel a stretch in your right inner thigh. 3. Hold this position for __________ seconds. Then slowly return to the upright position. 4. Bend at your waist to reach forward (position B). You should feel a stretch behind both of your thighs and knees. 5. Hold this position for __________ seconds. Then slowly return to the upright position. 6. Bend at your waist to reach for your right foot (position C). You should feel a stretch in your left inner thigh. 7. Hold this position for __________ seconds. Then slowly return to the upright position. Repeat __________ times. Complete this stretch __________ times a day. Exercise D: Lunge (Hip Flexors)  1. Place your left / right knee on the floor and bend your other knee so that is directly over your ankle. You should be half-kneeling. 2. Keep good posture with your head over your shoulders. 3. Tighten your buttocks to point your tailbone downward. This helps your back to keep from arching too much. 4. You should feel a gentle stretch in the front of your left / right thigh and hip. If you do not feel any resistance, slightly slide your other foot forward and then slowly lunge forward so your knee once again lines up over your ankle. 5. Make sure your tailbone continues to point downward. 6. Hold this position for __________ seconds. Repeat __________ times. Complete this stretch __________ times a day. STRENGTHENING EXERCISES These exercises build strength and endurance in your hip.  Endurance is the ability to use your muscles for a long time, even after they get tired. Exercise E: Bridge (Hip Extensors)  1. Lie on your back on a firm surface with your knees bent and your feet flat on the floor. 2. Tighten your buttocks muscles and lift your bottom off the floor until the trunk of your body is level with your thighs. ? Do not arch your back. ? You should feel the muscles working in your buttocks and the back of your thighs. If you do not feel these muscles, slide your feet 1-2 inches (2.5-5 cm) farther away from your buttocks. 3. Hold this position for __________ seconds. 4. Slowly lower your hips to the starting position. 5. Let your muscles relax completely between repetitions. 6. If this exercise is too easy, try doing it with your arms crossed over your chest. Repeat __________ times. Complete  this exercise __________ times a day. Exercise F: Straight Leg Raises - Hip Abductors  1. Lie on your side with your left / right leg in the top position. Lie so your head, shoulder, knee, and hip line up with each other. You may bend your bottom knee to help you balance. 2. Roll your hips slightly forward, so your hips are stacked directly over each other and your left / right knee is facing forward. 3. Leading with your heel, lift your top leg 4-6 inches (10-15 cm). You should feel the muscles in your outer hip lifting. ? Do not let your foot drift forward. ? Do not let your knee roll toward the ceiling. 4. Hold this position for __________ seconds. 5. Slowly return to the starting position. 6. Let your muscles relax completely between repetitions. Repeat __________ times. Complete this exercise __________ times a day. Exercise G: Straight Leg Raises - Hip Adductors  1. Lie on your side with your left / right leg in the bottom position. Lie so your head, shoulder, knee, and hip line up. You may place your upper foot in front to help you balance. 2. Roll your hips slightly  forward, so your hips are stacked directly over each other and your left / right knee is facing forward. 3. Tense the muscles in your inner thigh and lift your bottom leg 4-6 inches (10-15 cm). 4. Hold this position for __________ seconds. 5. Slowly return to the starting position. 6. Let your muscles relax completely between repetitions. Repeat __________ times. Complete this exercise __________ times a day. Exercise H: Straight Leg Raises - Quadriceps  1. Lie on your back with your left / right leg extended and your other knee bent. 2. Tense the muscles in the front of your left / right thigh. When you do this, you should see your kneecap slide up or see increased dimpling just above your knee. 3. Tighten these muscles even more and raise your leg 4-6 inches (10-15 cm) off the floor. 4. Hold this position for __________ seconds. 5. Keep these muscles tense as you lower your leg. 6. Relax the muscles slowly and completely between repetitions. Repeat __________ times. Complete this exercise __________ times a day. Exercise I: Hip Abductors, Standing 1. Tie one end of a rubber exercise band or tubing to a secure surface, such as a table or pole. 2. Loop the other end of the band or tubing around your left / right ankle. 3. Keeping your ankle with the band or tubing directly opposite of the secured end, step away until there is tension in the tubing or band. Hold onto a chair as needed for balance. 4. Lift your left / right leg out to your side. While you do this: ? Keep your back upright. ? Keep your shoulders over your hips. ? Keep your toes pointing forward. ? Make sure to use your hip muscles to lift your leg. Do not "throw" your leg or tip your body to lift your leg. 5. Hold this position for __________ seconds. 6. Slowly return to the starting position. Repeat __________ times. Complete this exercise __________ times a day. Exercise J: Squats (Quadriceps) 1. Stand in a door frame so  your feet and knees are in line with the frame. You may place your hands on the frame for balance. 2. Slowly bend your knees and lower your hips like you are going to sit in a chair. ? Keep your lower legs in a straight-up-and-down position. ? Do not  let your hips go lower than your knees. ? Do not bend your knees lower than told by your health care provider. ? If your hip pain increases, do not bend as low. 3. Hold this position for ___________ seconds. 4. Slowly push with your legs to return to standing. Do not use your hands to pull yourself to standing. Repeat __________ times. Complete this exercise __________ times a day. This information is not intended to replace advice given to you by your health care provider. Make sure you discuss any questions you have with your health care provider. Document Released: 11/30/2005 Document Revised: 08/06/2016 Document Reviewed: 11/07/2015 Elsevier Interactive Patient Education  [employer].

## 2018-07-15 NOTE — Assessment & Plan Note (Signed)
Since abdominal pain is actually in her groin area which is based on exam more likely radiating from her hips.  She is quite sedentary at baseline so this appears to be secondary to deconditioning as I believe her musculoskeletal pain is from the weakness of her hip flexors and spasm when she does ambulate.  Given home exercises and discussed return precautions.  Offered physical therapy but patient declined at this time

## 2018-07-15 NOTE — Progress Notes (Signed)
    Subjective:  Kimberly Carlson is a 72 y.o. female who presents to the [facility] today with a chief complaint of abdominal pain.   HPI:   Abdominal pain  Since Sunday, bilateral lower in groin area. Feels like sharp pulling sensation but only on standing. Not present at rest. Does no radiate. No nausea. No dysuria or urgency. No vaginal symptoms. Eating okay. Having regular BMs. Just woke up with this pain. No trauma or unusual physical activity.  She is quite sedentary at baseline No numbness or tingling.   Blood pressure Taking coreg 25mg  BID and hydralazine 50mg  TID Has not been HCTZ because she ran out of medications.  No lightheadedness or dizziness. No CP Has nephrology appointment 08/20/18    ROS: Per HPI  PMH: She reports that she quit smoking about 2 years ago. Her smoking use included cigarettes. She started smoking about 4 years ago. She smoked 1.00 pack per day. She has never used smokeless tobacco. She reports that she does not drink alcohol or use drugs.    Objective:  Physical Exam: BP (!) 170/64   Pulse 84   Temp 98.2 F (36.8 C) (Oral)   Wt 202 lb (91.6 kg)   LMP  (LMP Unknown)   SpO2 95%   BMI 35.78 kg/m   Gen: NAD, resting comfortably CV: RRR with no murmurs appreciated Pulm: NWOB, CTAB with no crackles, wheezes, or rhonchi GI: Normal bowel sounds present. Soft, Nontender, Nondistended. No hernias MSK: positive FABER bilaterally, neg straight leg test Skin: warm, dry Neuro: grossly normal, moves all extremities Psych: Normal affect and thought content   Assessment/Plan:  Pain of both hip joints Since abdominal pain is actually in her groin area which is based on exam more likely radiating from her hips.  She is quite sedentary at baseline so this appears to be secondary to deconditioning as I believe her musculoskeletal pain is from the weakness of her hip flexors and spasm when she does ambulate.  Given home exercises and discussed return  precautions.  Offered physical therapy but patient declined at this time  Resistant hypertension Blood pressure is stable but still elevated. Continue coreg 25mg  BID and hydralazine 50mg  TID since was not able to tolerate higher dose of hydralazine. Discussed with pharmacy resident. Retrial HCTZ 25mg  qd and recheck BP in 2 weeks.   Bufford Lope, DO PGY-3, [facility] 07/15/2018 11:20 AM

## 2018-07-16 NOTE — Telephone Encounter (Signed)
-----   Message from Steve Rattler, DO sent at 07/16/2018  9:35 AM EDT ----- Regarding: disability form  Form filled out and put in RN box  Lucila Maine, DO PGY-3, [facility] 07/16/2018 9:35 AM

## 2018-07-16 NOTE — Telephone Encounter (Signed)
Left message for patient that form available for pick up at front desk.  Danley Danker, RN [facility] RN)

## 2018-07-21 ENCOUNTER — Ambulatory Visit (HOSPITAL_COMMUNITY)
Admission: RE | Admit: 2018-07-21 | Discharge: 2018-07-21 | Disposition: A | Discharge status: Home / Self Care | Payer: Medicare Other | Source: Ambulatory Visit | Attending: Nephrology | Admitting: Nephrology

## 2018-07-21 VITALS — BP 161/59 | HR 67 | Temp 97.9°F | Ht 63.0 in | Wt 202.0 lb

## 2018-07-21 DIAGNOSIS — Z79899 Other long term (current) drug therapy: Secondary | ICD-10-CM | POA: Diagnosis not present

## 2018-07-21 DIAGNOSIS — N189 Chronic kidney disease, unspecified: Secondary | ICD-10-CM

## 2018-07-21 DIAGNOSIS — D631 Anemia in chronic kidney disease: Secondary | ICD-10-CM | POA: Insufficient documentation

## 2018-07-21 DIAGNOSIS — Z5181 Encounter for therapeutic drug level monitoring: Secondary | ICD-10-CM | POA: Insufficient documentation

## 2018-07-21 DIAGNOSIS — N183 Chronic kidney disease, stage 3 (moderate): Secondary | ICD-10-CM | POA: Insufficient documentation

## 2018-07-21 DIAGNOSIS — N179 Acute kidney failure, unspecified: Secondary | ICD-10-CM

## 2018-07-21 LAB — IRON AND TIBC
Iron: 59 ug/dL (ref 28–170)
Saturation Ratios: 25 % (ref 10.4–31.8)
TIBC: 235 ug/dL — ABNORMAL LOW (ref 250–450)
UIBC: 176 ug/dL

## 2018-07-21 LAB — RENAL FUNCTION PANEL
Albumin: 3 g/dL — ABNORMAL LOW (ref 3.5–5.0)
Anion gap: 11 (ref 5–15)
BUN: 47 mg/dL — ABNORMAL HIGH (ref 8–23)
CO2: 25 mmol/L (ref 22–32)
Calcium: 8.9 mg/dL (ref 8.9–10.3)
Chloride: 103 mmol/L (ref 98–111)
Creatinine, Ser: 2.46 mg/dL — ABNORMAL HIGH (ref 0.44–1.00)
GFR calc Af Amer: 21 mL/min — ABNORMAL LOW (ref >60)
GFR calc non Af Amer: 18 mL/min — ABNORMAL LOW (ref >60)
Glucose, Bld: 199 mg/dL — ABNORMAL HIGH (ref 70–99)
Phosphorus: 4.5 mg/dL (ref 2.5–4.6)
Potassium: 3.7 mmol/L (ref 3.5–5.1)
Sodium: 139 mmol/L (ref 135–145)

## 2018-07-21 LAB — FERRITIN: Ferritin: 293 ng/mL (ref 11–307)

## 2018-07-21 LAB — POCT HEMOGLOBIN-HEMACUE: Hemoglobin: 11 g/dL — ABNORMAL LOW (ref 12.0–15.0)

## 2018-07-21 LAB — MAGNESIUM: Magnesium: 1.9 mg/dL (ref 1.7–2.4)

## 2018-07-21 MED ORDER — EPOETIN ALFA 10000 UNIT/ML IJ SOLN
10000.0000 [IU] | INTRAMUSCULAR | Status: DC
  Administered 2018-07-21: 10000 [IU] via SUBCUTANEOUS

## 2018-07-21 MED ORDER — EPOETIN ALFA 10000 UNIT/ML IJ SOLN
INTRAMUSCULAR | Status: AC
  Administered 2018-07-21: 10000 [IU] via SUBCUTANEOUS
  Filled 2018-07-21: qty 1

## 2018-07-22 ENCOUNTER — Ambulatory Visit: Payer: Medicare Other

## 2018-07-22 VITALS — BP 128/70

## 2018-07-22 DIAGNOSIS — I10 Essential (primary) hypertension: Secondary | ICD-10-CM

## 2018-07-22 NOTE — Progress Notes (Signed)
Patient here today for BP check. BP today is 128/70.  Checked BP in L arm with Large cuff.  Symptoms present: none.   Patient last took BP med today at [place] today.  Routed note to PCP.    Wallace Cullens, RN

## 2018-07-25 ENCOUNTER — Encounter: Payer: Self-pay | Admitting: Nephrology

## 2018-07-25 DIAGNOSIS — N189 Chronic kidney disease, unspecified: Secondary | ICD-10-CM

## 2018-07-25 DIAGNOSIS — D631 Anemia in chronic kidney disease: Secondary | ICD-10-CM | POA: Insufficient documentation

## 2018-07-29 ENCOUNTER — Ambulatory Visit (HOSPITAL_COMMUNITY)
Admission: RE | Admit: 2018-07-29 | Discharge: 2018-07-29 | Disposition: A | Discharge status: Home / Self Care | Payer: Medicare Other | Source: Ambulatory Visit | Attending: Nephrology | Admitting: Nephrology

## 2018-07-29 VITALS — BP 154/61 | HR 73 | Temp 98.2°F | Resp 20

## 2018-07-29 DIAGNOSIS — D631 Anemia in chronic kidney disease: Secondary | ICD-10-CM | POA: Diagnosis not present

## 2018-07-29 DIAGNOSIS — N179 Acute kidney failure, unspecified: Secondary | ICD-10-CM | POA: Insufficient documentation

## 2018-07-29 DIAGNOSIS — N189 Chronic kidney disease, unspecified: Secondary | ICD-10-CM

## 2018-07-29 DIAGNOSIS — N183 Chronic kidney disease, stage 3 (moderate): Secondary | ICD-10-CM | POA: Insufficient documentation

## 2018-07-29 LAB — CBC
HCT: 34.3 % — ABNORMAL LOW (ref 36.0–46.0)
Hemoglobin: 10.7 g/dL — ABNORMAL LOW (ref 12.0–15.0)
MCH: 29.8 pg (ref 26.0–34.0)
MCHC: 31.2 g/dL (ref 30.0–36.0)
MCV: 95.5 fL (ref 78.0–100.0)
Platelets: 196 10*3/uL (ref 150–400)
RBC: 3.59 MIL/uL — ABNORMAL LOW (ref 3.87–5.11)
RDW: 13.8 % (ref 11.5–15.5)
WBC: 9.4 10*3/uL (ref 4.0–10.5)

## 2018-07-29 MED ORDER — EPOETIN ALFA 10000 UNIT/ML IJ SOLN
10000.0000 [IU] | INTRAMUSCULAR | Status: DC
  Administered 2018-07-29: 10000 [IU] via SUBCUTANEOUS

## 2018-07-29 MED ORDER — EPOETIN ALFA 10000 UNIT/ML IJ SOLN
INTRAMUSCULAR | Status: AC
  Filled 2018-07-29: qty 1

## 2018-08-05 ENCOUNTER — Ambulatory Visit (HOSPITAL_COMMUNITY)
Admission: RE | Admit: 2018-08-05 | Discharge: 2018-08-05 | Disposition: A | Discharge status: Home / Self Care | Payer: Medicare Other | Source: Ambulatory Visit | Attending: Nephrology | Admitting: Nephrology

## 2018-08-05 VITALS — BP 147/61 | HR 67 | Temp 98.2°F | Resp 20

## 2018-08-05 DIAGNOSIS — N179 Acute kidney failure, unspecified: Secondary | ICD-10-CM

## 2018-08-05 DIAGNOSIS — D631 Anemia in chronic kidney disease: Secondary | ICD-10-CM | POA: Diagnosis not present

## 2018-08-05 DIAGNOSIS — N183 Chronic kidney disease, stage 3 (moderate): Secondary | ICD-10-CM | POA: Insufficient documentation

## 2018-08-05 DIAGNOSIS — N189 Chronic kidney disease, unspecified: Secondary | ICD-10-CM

## 2018-08-05 LAB — POCT HEMOGLOBIN-HEMACUE: Hemoglobin: 11.3 g/dL — ABNORMAL LOW (ref 12.0–15.0)

## 2018-08-05 MED ORDER — EPOETIN ALFA 10000 UNIT/ML IJ SOLN
INTRAMUSCULAR | Status: AC
  Administered 2018-08-05: 10000 [IU] via SUBCUTANEOUS
  Filled 2018-08-05: qty 1

## 2018-08-05 MED ORDER — EPOETIN ALFA 10000 UNIT/ML IJ SOLN
10000.0000 [IU] | INTRAMUSCULAR | Status: DC
  Administered 2018-08-05: 10000 [IU] via SUBCUTANEOUS

## 2018-08-12 ENCOUNTER — Ambulatory Visit (HOSPITAL_COMMUNITY)
Admission: RE | Admit: 2018-08-12 | Discharge: 2018-08-12 | Disposition: A | Discharge status: Home / Self Care | Payer: Medicare Other | Source: Ambulatory Visit | Attending: Nephrology | Admitting: Nephrology

## 2018-08-12 VITALS — BP 166/53 | HR 77 | Resp 18

## 2018-08-12 DIAGNOSIS — N179 Acute kidney failure, unspecified: Secondary | ICD-10-CM | POA: Insufficient documentation

## 2018-08-12 DIAGNOSIS — D631 Anemia in chronic kidney disease: Secondary | ICD-10-CM | POA: Insufficient documentation

## 2018-08-12 DIAGNOSIS — N183 Chronic kidney disease, stage 3 (moderate): Secondary | ICD-10-CM | POA: Insufficient documentation

## 2018-08-12 DIAGNOSIS — N189 Chronic kidney disease, unspecified: Secondary | ICD-10-CM

## 2018-08-12 LAB — POCT HEMOGLOBIN-HEMACUE: Hemoglobin: 11.1 g/dL — ABNORMAL LOW (ref 12.0–15.0)

## 2018-08-12 MED ORDER — EPOETIN ALFA 10000 UNIT/ML IJ SOLN
10000.0000 [IU] | INTRAMUSCULAR | Status: DC
  Administered 2018-08-12: 10000 [IU] via SUBCUTANEOUS

## 2018-08-12 MED ORDER — EPOETIN ALFA 10000 UNIT/ML IJ SOLN
INTRAMUSCULAR | Status: AC
  Administered 2018-08-12: 10000 [IU] via SUBCUTANEOUS
  Filled 2018-08-12: qty 1

## 2018-08-19 ENCOUNTER — Ambulatory Visit (HOSPITAL_COMMUNITY)
Admission: RE | Admit: 2018-08-19 | Discharge: 2018-08-19 | Disposition: A | Discharge status: Home / Self Care | Payer: Medicare Other | Source: Ambulatory Visit | Attending: Nephrology | Admitting: Nephrology

## 2018-08-19 VITALS — BP 128/54 | HR 75 | Temp 97.8°F | Resp 20

## 2018-08-19 DIAGNOSIS — N189 Chronic kidney disease, unspecified: Secondary | ICD-10-CM

## 2018-08-19 DIAGNOSIS — N183 Chronic kidney disease, stage 3 (moderate): Secondary | ICD-10-CM | POA: Diagnosis not present

## 2018-08-19 DIAGNOSIS — N179 Acute kidney failure, unspecified: Secondary | ICD-10-CM

## 2018-08-19 DIAGNOSIS — D631 Anemia in chronic kidney disease: Secondary | ICD-10-CM | POA: Diagnosis not present

## 2018-08-19 LAB — RENAL FUNCTION PANEL
Albumin: 3.4 g/dL — ABNORMAL LOW (ref 3.5–5.0)
Anion gap: 14 (ref 5–15)
BUN: 63 mg/dL — ABNORMAL HIGH (ref 8–23)
CO2: 25 mmol/L (ref 22–32)
Calcium: 9.7 mg/dL (ref 8.9–10.3)
Chloride: 104 mmol/L (ref 98–111)
Creatinine, Ser: 2.57 mg/dL — ABNORMAL HIGH (ref 0.44–1.00)
GFR calc Af Amer: 20 mL/min — ABNORMAL LOW (ref >60)
GFR calc non Af Amer: 17 mL/min — ABNORMAL LOW (ref >60)
Glucose, Bld: 160 mg/dL — ABNORMAL HIGH (ref 70–99)
Phosphorus: 4.5 mg/dL (ref 2.5–4.6)
Potassium: 4 mmol/L (ref 3.5–5.1)
Sodium: 143 mmol/L (ref 135–145)

## 2018-08-19 LAB — MAGNESIUM: Magnesium: 2 mg/dL (ref 1.7–2.4)

## 2018-08-19 LAB — IRON AND TIBC
Iron: 65 ug/dL (ref 28–170)
Saturation Ratios: 24 % (ref 10.4–31.8)
TIBC: 270 ug/dL (ref 250–450)
UIBC: 205 ug/dL

## 2018-08-19 LAB — POCT HEMOGLOBIN-HEMACUE: Hemoglobin: 13 g/dL (ref 12.0–15.0)

## 2018-08-19 LAB — FERRITIN: Ferritin: 175 ng/mL (ref 11–307)

## 2018-08-19 MED ORDER — EPOETIN ALFA 10000 UNIT/ML IJ SOLN: 10000.0000 [IU] | INTRAMUSCULAR | Status: DC

## 2018-08-20 DIAGNOSIS — I129 Hypertensive chronic kidney disease with stage 1 through stage 4 chronic kidney disease, or unspecified chronic kidney disease: Secondary | ICD-10-CM | POA: Diagnosis not present

## 2018-08-20 DIAGNOSIS — N2581 Secondary hyperparathyroidism of renal origin: Secondary | ICD-10-CM | POA: Diagnosis not present

## 2018-08-20 DIAGNOSIS — N184 Chronic kidney disease, stage 4 (severe): Secondary | ICD-10-CM | POA: Diagnosis not present

## 2018-08-20 DIAGNOSIS — D631 Anemia in chronic kidney disease: Secondary | ICD-10-CM | POA: Diagnosis not present

## 2018-08-26 ENCOUNTER — Encounter (HOSPITAL_COMMUNITY): Payer: Medicare Other

## 2018-09-01 ENCOUNTER — Encounter: Payer: Self-pay | Admitting: Family Medicine

## 2018-09-01 DIAGNOSIS — E1165 Type 2 diabetes mellitus with hyperglycemia: Secondary | ICD-10-CM

## 2018-09-01 DIAGNOSIS — Z794 Long term (current) use of insulin: Principal | ICD-10-CM

## 2018-09-02 ENCOUNTER — Other Ambulatory Visit (HOSPITAL_COMMUNITY): Payer: Self-pay

## 2018-09-02 ENCOUNTER — Ambulatory Visit (HOSPITAL_COMMUNITY)
Admission: RE | Admit: 2018-09-02 | Discharge: 2018-09-02 | Disposition: A | Discharge status: Home / Self Care | Payer: Medicare Other | Source: Ambulatory Visit | Attending: Nephrology | Admitting: Nephrology

## 2018-09-02 VITALS — BP 149/62 | HR 70 | Temp 98.0°F | Resp 20

## 2018-09-02 DIAGNOSIS — N189 Chronic kidney disease, unspecified: Secondary | ICD-10-CM

## 2018-09-02 DIAGNOSIS — N183 Chronic kidney disease, stage 3 (moderate): Secondary | ICD-10-CM | POA: Insufficient documentation

## 2018-09-02 DIAGNOSIS — D631 Anemia in chronic kidney disease: Secondary | ICD-10-CM | POA: Insufficient documentation

## 2018-09-02 DIAGNOSIS — N179 Acute kidney failure, unspecified: Secondary | ICD-10-CM

## 2018-09-02 LAB — DIFFERENTIAL
Abs Immature Granulocytes: 0.03 10*3/uL (ref 0.00–0.07)
Basophils Absolute: 0.1 10*3/uL (ref 0.0–0.1)
Basophils Relative: 1 %
Eosinophils Absolute: 0.5 10*3/uL (ref 0.0–0.5)
Eosinophils Relative: 5 %
Immature Granulocytes: 0 %
Lymphocytes Relative: 25 %
Lymphs Abs: 2.7 10*3/uL (ref 0.7–4.0)
Monocytes Absolute: 0.7 10*3/uL (ref 0.1–1.0)
Monocytes Relative: 7 %
Neutro Abs: 6.7 10*3/uL (ref 1.7–7.7)
Neutrophils Relative %: 62 %

## 2018-09-02 LAB — CBC
HCT: 36.8 % (ref 36.0–46.0)
Hemoglobin: 11.4 g/dL — ABNORMAL LOW (ref 12.0–15.0)
MCH: 29.1 pg (ref 26.0–34.0)
MCHC: 31 g/dL (ref 30.0–36.0)
MCV: 93.9 fL (ref 80.0–100.0)
Platelets: 152 10*3/uL (ref 150–400)
RBC: 3.92 MIL/uL (ref 3.87–5.11)
RDW: 13.1 % (ref 11.5–15.5)
WBC: 10.8 10*3/uL — ABNORMAL HIGH (ref 4.0–10.5)
nRBC: 0 % (ref 0.0–0.2)

## 2018-09-02 MED ORDER — EPOETIN ALFA-EPBX 10000 UNIT/ML IJ SOLN
10000.0000 [IU] | INTRAMUSCULAR | Status: DC
  Administered 2018-09-02: 10000 [IU] via SUBCUTANEOUS
  Filled 2018-09-02: qty 1

## 2018-09-02 MED ORDER — INSULIN GLARGINE 100 UNIT/ML ~~LOC~~ SOLN: SUBCUTANEOUS | 2 refills | Status: DC

## 2018-09-04 ENCOUNTER — Other Ambulatory Visit: Payer: Self-pay | Admitting: *Deleted

## 2018-09-04 DIAGNOSIS — Z794 Long term (current) use of insulin: Principal | ICD-10-CM

## 2018-09-04 DIAGNOSIS — E1165 Type 2 diabetes mellitus with hyperglycemia: Secondary | ICD-10-CM

## 2018-09-05 MED ORDER — INSULIN GLARGINE 100 UNIT/ML ~~LOC~~ SOLN: SUBCUTANEOUS | 2 refills | Status: DC

## 2018-09-10 ENCOUNTER — Encounter (HOSPITAL_COMMUNITY): Payer: Medicare Other

## 2018-09-16 ENCOUNTER — Encounter (HOSPITAL_COMMUNITY)
Admission: RE | Admit: 2018-09-16 | Discharge: 2018-09-16 | Disposition: A | Discharge status: Home / Self Care | Payer: Medicare Other | Source: Ambulatory Visit | Attending: Nephrology | Admitting: Nephrology

## 2018-09-16 VITALS — BP 148/65 | HR 71 | Temp 98.6°F | Resp 20 | Ht 63.0 in | Wt 198.0 lb

## 2018-09-16 DIAGNOSIS — N179 Acute kidney failure, unspecified: Secondary | ICD-10-CM

## 2018-09-16 DIAGNOSIS — D631 Anemia in chronic kidney disease: Secondary | ICD-10-CM | POA: Insufficient documentation

## 2018-09-16 DIAGNOSIS — N189 Chronic kidney disease, unspecified: Secondary | ICD-10-CM

## 2018-09-16 DIAGNOSIS — N183 Chronic kidney disease, stage 3 (moderate): Secondary | ICD-10-CM | POA: Diagnosis not present

## 2018-09-16 LAB — RENAL FUNCTION PANEL
Albumin: 3 g/dL — ABNORMAL LOW (ref 3.5–5.0)
Anion gap: 10 (ref 5–15)
BUN: 54 mg/dL — ABNORMAL HIGH (ref 8–23)
CO2: 23 mmol/L (ref 22–32)
Calcium: 9.2 mg/dL (ref 8.9–10.3)
Chloride: 103 mmol/L (ref 98–111)
Creatinine, Ser: 2.18 mg/dL — ABNORMAL HIGH (ref 0.44–1.00)
GFR calc Af Amer: 24 mL/min — ABNORMAL LOW (ref >60)
GFR calc non Af Amer: 21 mL/min — ABNORMAL LOW (ref >60)
Glucose, Bld: 359 mg/dL — ABNORMAL HIGH (ref 70–99)
Phosphorus: 3.9 mg/dL (ref 2.5–4.6)
Potassium: 4.1 mmol/L (ref 3.5–5.1)
Sodium: 136 mmol/L (ref 135–145)

## 2018-09-16 LAB — POCT HEMOGLOBIN-HEMACUE: Hemoglobin: 11.5 g/dL — ABNORMAL LOW (ref 12.0–15.0)

## 2018-09-16 LAB — MAGNESIUM: Magnesium: 1.6 mg/dL — ABNORMAL LOW (ref 1.7–2.4)

## 2018-09-16 MED ORDER — SODIUM CHLORIDE 0.9 % IV SOLN
510.0000 mg | INTRAVENOUS | Status: DC
  Administered 2018-09-16: 510 mg via INTRAVENOUS
  Filled 2018-09-16: qty 17

## 2018-09-16 MED ORDER — DIPHENHYDRAMINE HCL 25 MG PO CAPS
ORAL_CAPSULE | ORAL | Status: AC
  Filled 2018-09-16: qty 1

## 2018-09-16 MED ORDER — EPOETIN ALFA-EPBX 10000 UNIT/ML IJ SOLN
10000.0000 [IU] | INTRAMUSCULAR | Status: DC
  Administered 2018-09-16: 10000 [IU] via SUBCUTANEOUS
  Filled 2018-09-16: qty 1

## 2018-09-16 MED ORDER — DIPHENHYDRAMINE HCL 25 MG PO CAPS
25.0000 mg | ORAL_CAPSULE | Freq: Once | ORAL | Status: AC
  Administered 2018-09-16: 25 mg via ORAL

## 2018-09-16 NOTE — Progress Notes (Signed)
Pt completed feraheme infusion as ordered and as I was stopping infusion pt c/o nausea and tingling in feet. VS show BP up from admission to 178/70. Feet elevated, cool compress applied to forehead. Dr Posey Pronto called and orders received. Pt to continue with 2nd infusion next week with premed prior to infusion.

## 2018-09-16 NOTE — Progress Notes (Signed)
Pt continues to have nausea with some tingling in feet. Dr Posey Pronto notified and [facility] ordered.

## 2018-09-16 NOTE — Discharge Instructions (Signed)
Ferumoxytol injection °What is this medicine? °FERUMOXYTOL is an iron complex. Iron is used to make healthy red blood cells, which carry oxygen and nutrients throughout the body. This medicine is used to treat iron deficiency anemia in people with chronic kidney disease. °This medicine may be used for other purposes; ask your health care provider or pharmacist if you have questions. °COMMON BRAND NAME(S): Feraheme °What should I tell my health care provider before I take this medicine? °They need to know if you have any of these conditions: °-anemia not caused by low iron levels °-high levels of iron in the blood °-magnetic resonance imaging (MRI) test scheduled °-an unusual or allergic reaction to iron, other medicines, foods, dyes, or preservatives °-pregnant or trying to get pregnant °-breast-feeding °How should I use this medicine? °This medicine is for injection into a vein. It is given by a health care professional in a hospital or clinic setting. °Talk to your pediatrician regarding the use of this medicine in children. Special care may be needed. °Overdosage: If you think you have taken too much of this medicine contact a poison control center or emergency room at once. °NOTE: This medicine is only for you. Do not share this medicine with others. °What if I miss a dose? °It is important not to miss your dose. Call your doctor or health care professional if you are unable to keep an appointment. °What may interact with this medicine? °This medicine may interact with the following medications: °-other iron products °This list may not describe all possible interactions. Give your health care provider a list of all the medicines, herbs, non-prescription drugs, or dietary supplements you use. Also tell them if you smoke, drink alcohol, or use illegal drugs. Some items may interact with your medicine. °What should I watch for while using this medicine? °Visit your doctor or healthcare professional regularly. Tell  your doctor or healthcare professional if your symptoms do not start to get better or if they get worse. You may need blood work done while you are taking this medicine. °You may need to follow a special diet. Talk to your doctor. Foods that contain iron include: whole grains/cereals, dried fruits, beans, or peas, leafy green vegetables, and organ meats (liver, kidney). °What side effects may I notice from receiving this medicine? °Side effects that you should report to your doctor or health care professional as soon as possible: °-allergic reactions like skin rash, itching or hives, swelling of the face, lips, or tongue °-breathing problems °-changes in blood pressure °-feeling faint or lightheaded, falls °-fever or chills °-flushing, sweating, or hot feelings °-swelling of the ankles or feet °Side effects that usually do not require medical attention (report to your doctor or health care professional if they continue or are bothersome): °-diarrhea °-headache °-nausea, vomiting °-stomach pain °This list may not describe all possible side effects. Call your doctor for medical advice about side effects. You may report side effects to FDA at 1-800-FDA-1088. °Where should I keep my medicine? °This drug is given in a hospital or clinic and will not be stored at home. °NOTE: This sheet is a summary. It may not cover all possible information. If you have questions about this medicine, talk to your doctor, pharmacist, or health care provider. °© 2018 Elsevier/Gold Standard (2015-12-15 12:41:49) ° ° °Epoetin Alfa injection °What is this medicine? °EPOETIN ALFA (e POE e tin AL fa) helps your body make more red blood cells. This medicine is used to treat anemia caused by chronic kidney   failure, cancer chemotherapy, or HIV-therapy. It may also be used before surgery if you have anemia. °This medicine may be used for other purposes; ask your health care provider or pharmacist if you have questions. °COMMON BRAND NAME(S):  Epogen, Procrit °What should I tell my health care provider before I take this medicine? °They need to know if you have any of these conditions: °-blood clotting disorders °-cancer patient not on chemotherapy °-cystic fibrosis °-heart disease, such as angina or heart failure °-hemoglobin level of 12 g/dL or greater °-high blood pressure °-low levels of folate, iron, or vitamin B12 °-seizures °-an unusual or allergic reaction to erythropoietin, albumin, benzyl alcohol, hamster proteins, other medicines, foods, dyes, or preservatives °-pregnant or trying to get pregnant °-breast-feeding °How should I use this medicine? °This medicine is for injection into a vein or under the skin. It is usually given by a health care professional in a hospital or clinic setting. °If you get this medicine at home, you will be taught how to prepare and give this medicine. Use exactly as directed. Take your medicine at regular intervals. Do not take your medicine more often than directed. °It is important that you put your used needles and syringes in a special sharps container. Do not put them in a trash can. If you do not have a sharps container, call your pharmacist or healthcare provider to get one. °A special MedGuide will be given to you by the pharmacist with each prescription and refill. Be sure to read this information carefully each time. °Talk to your pediatrician regarding the use of this medicine in children. While this drug may be prescribed for selected conditions, precautions do apply. °Overdosage: If you think you have taken too much of this medicine contact a poison control center or emergency room at once. °NOTE: This medicine is only for you. Do not share this medicine with others. °What if I miss a dose? °If you miss a dose, take it as soon as you can. If it is almost time for your next dose, take only that dose. Do not take double or extra doses. °What may interact with this medicine? °Do not take this medicine with  any of the following medications: °-darbepoetin alfa °This list may not describe all possible interactions. Give your health care provider a list of all the medicines, herbs, non-prescription drugs, or dietary supplements you use. Also tell them if you smoke, drink alcohol, or use illegal drugs. Some items may interact with your medicine. °What should I watch for while using this medicine? °Your condition will be monitored carefully while you are receiving this medicine. °You may need blood work done while you are taking this medicine. °What side effects may I notice from receiving this medicine? °Side effects that you should report to your doctor or health care professional as soon as possible: °-allergic reactions like skin rash, itching or hives, swelling of the face, lips, or tongue °-breathing problems °-changes in vision °-chest pain °-confusion, trouble speaking or understanding °-feeling faint or lightheaded, falls °-high blood pressure °-muscle aches or pains °-pain, swelling, warmth in the leg °-rapid weight gain °-severe headaches °-sudden numbness or weakness of the face, arm or leg °-trouble walking, dizziness, loss of balance or coordination °-seizures (convulsions) °-swelling of the ankles, feet, hands °-unusually weak or tired °Side effects that usually do not require medical attention (report to your doctor or health care professional if they continue or are bothersome): °-diarrhea °-fever, chills (flu-like symptoms) °-headaches °-nausea, vomiting °-redness, stinging,   or swelling at site where injected °This list may not describe all possible side effects. Call your doctor for medical advice about side effects. You may report side effects to FDA at 1-800-FDA-1088. °Where should I keep my medicine? °Keep out of the reach of children. °Store in a refrigerator between 2 and 8 degrees C (36 and 46 degrees F). Do not freeze or shake. Throw away any unused portion if using a single-dose vial. Multi-dose  vials can be kept in the refrigerator for up to 21 days after the initial dose. Throw away unused medicine. °NOTE: This sheet is a summary. It may not cover all possible information. If you have questions about this medicine, talk to your doctor, pharmacist, or health care provider. °© 2018 Elsevier/Gold Standard (2016-07-02 19:42:31) ° °

## 2018-09-23 ENCOUNTER — Ambulatory Visit (HOSPITAL_COMMUNITY)
Admission: RE | Admit: 2018-09-23 | Discharge: 2018-09-23 | Disposition: A | Discharge status: Home / Self Care | Payer: Medicare Other | Source: Ambulatory Visit | Attending: Nephrology | Admitting: Nephrology

## 2018-09-23 DIAGNOSIS — N183 Chronic kidney disease, stage 3 (moderate): Secondary | ICD-10-CM | POA: Diagnosis not present

## 2018-09-23 DIAGNOSIS — D631 Anemia in chronic kidney disease: Secondary | ICD-10-CM | POA: Insufficient documentation

## 2018-09-23 MED ORDER — ACETAMINOPHEN 325 MG PO TABS
ORAL_TABLET | ORAL | Status: AC
  Administered 2018-09-23: 325 mg via ORAL
  Filled 2018-09-23: qty 1

## 2018-09-23 MED ORDER — ACETAMINOPHEN 325 MG PO TABS
325.0000 mg | ORAL_TABLET | Freq: Once | ORAL | Status: AC
  Administered 2018-09-23: 325 mg via ORAL

## 2018-09-23 MED ORDER — SODIUM CHLORIDE 0.9 % IV SOLN
510.0000 mg | INTRAVENOUS | Status: DC
  Administered 2018-09-23: 510 mg via INTRAVENOUS
  Filled 2018-09-23: qty 17

## 2018-09-23 MED ORDER — DIPHENHYDRAMINE HCL 25 MG PO CAPS
ORAL_CAPSULE | ORAL | Status: AC
  Administered 2018-09-23: 25 mg via ORAL
  Filled 2018-09-23: qty 1

## 2018-09-23 MED ORDER — DIPHENHYDRAMINE HCL 25 MG PO CAPS
25.0000 mg | ORAL_CAPSULE | Freq: Once | ORAL | Status: AC
  Administered 2018-09-23: 25 mg via ORAL

## 2018-09-30 ENCOUNTER — Ambulatory Visit (HOSPITAL_COMMUNITY)
Admission: RE | Admit: 2018-09-30 | Discharge: 2018-09-30 | Disposition: A | Discharge status: Home / Self Care | Payer: Medicare Other | Source: Ambulatory Visit | Attending: Nephrology | Admitting: Nephrology

## 2018-09-30 VITALS — BP 153/63 | HR 56 | Temp 98.1°F | Resp 20

## 2018-09-30 DIAGNOSIS — N179 Acute kidney failure, unspecified: Secondary | ICD-10-CM | POA: Diagnosis not present

## 2018-09-30 DIAGNOSIS — D631 Anemia in chronic kidney disease: Secondary | ICD-10-CM | POA: Insufficient documentation

## 2018-09-30 DIAGNOSIS — N189 Chronic kidney disease, unspecified: Secondary | ICD-10-CM | POA: Insufficient documentation

## 2018-09-30 LAB — CBC
HCT: 35.5 % — ABNORMAL LOW (ref 36.0–46.0)
Hemoglobin: 11 g/dL — ABNORMAL LOW (ref 12.0–15.0)
MCH: 29.1 pg (ref 26.0–34.0)
MCHC: 31 g/dL (ref 30.0–36.0)
MCV: 93.9 fL (ref 80.0–100.0)
Platelets: 162 10*3/uL (ref 150–400)
RBC: 3.78 MIL/uL — ABNORMAL LOW (ref 3.87–5.11)
RDW: 14.1 % (ref 11.5–15.5)
WBC: 10.1 10*3/uL (ref 4.0–10.5)
nRBC: 0 % (ref 0.0–0.2)

## 2018-09-30 LAB — IRON AND TIBC
Iron: 121 ug/dL (ref 28–170)
Saturation Ratios: 50 % — ABNORMAL HIGH (ref 10.4–31.8)
TIBC: 244 ug/dL — ABNORMAL LOW (ref 250–450)
UIBC: 123 ug/dL

## 2018-09-30 LAB — DIFFERENTIAL
Abs Immature Granulocytes: 0.04 10*3/uL (ref 0.00–0.07)
Basophils Absolute: 0.1 10*3/uL (ref 0.0–0.1)
Basophils Relative: 1 %
Eosinophils Absolute: 0.3 10*3/uL (ref 0.0–0.5)
Eosinophils Relative: 3 %
Immature Granulocytes: 0 %
Lymphocytes Relative: 26 %
Lymphs Abs: 2.6 10*3/uL (ref 0.7–4.0)
Monocytes Absolute: 0.7 10*3/uL (ref 0.1–1.0)
Monocytes Relative: 7 %
Neutro Abs: 6.4 10*3/uL (ref 1.7–7.7)
Neutrophils Relative %: 63 %

## 2018-09-30 LAB — FERRITIN: Ferritin: 1098 ng/mL — ABNORMAL HIGH (ref 11–307)

## 2018-09-30 MED ORDER — EPOETIN ALFA-EPBX 10000 UNIT/ML IJ SOLN
10000.0000 [IU] | INTRAMUSCULAR | Status: DC
  Administered 2018-09-30: 10000 [IU] via SUBCUTANEOUS
  Filled 2018-09-30: qty 1

## 2018-10-14 ENCOUNTER — Ambulatory Visit (HOSPITAL_COMMUNITY)
Admission: RE | Admit: 2018-10-14 | Discharge: 2018-10-14 | Disposition: A | Discharge status: Home / Self Care | Payer: Medicare Other | Source: Ambulatory Visit | Attending: Nephrology | Admitting: Nephrology

## 2018-10-14 VITALS — BP 148/61 | HR 67 | Temp 98.0°F | Resp 20

## 2018-10-14 DIAGNOSIS — D631 Anemia in chronic kidney disease: Secondary | ICD-10-CM | POA: Insufficient documentation

## 2018-10-14 DIAGNOSIS — N179 Acute kidney failure, unspecified: Secondary | ICD-10-CM | POA: Insufficient documentation

## 2018-10-14 DIAGNOSIS — N189 Chronic kidney disease, unspecified: Secondary | ICD-10-CM | POA: Diagnosis not present

## 2018-10-14 LAB — RENAL FUNCTION PANEL
Albumin: 3.3 g/dL — ABNORMAL LOW (ref 3.5–5.0)
Anion gap: 10 (ref 5–15)
BUN: 47 mg/dL — ABNORMAL HIGH (ref 8–23)
CO2: 25 mmol/L (ref 22–32)
Calcium: 9.3 mg/dL (ref 8.9–10.3)
Chloride: 106 mmol/L (ref 98–111)
Creatinine, Ser: 2.52 mg/dL — ABNORMAL HIGH (ref 0.44–1.00)
GFR calc Af Amer: 21 mL/min — ABNORMAL LOW (ref >60)
GFR calc non Af Amer: 18 mL/min — ABNORMAL LOW (ref >60)
Glucose, Bld: 184 mg/dL — ABNORMAL HIGH (ref 70–99)
Phosphorus: 4.5 mg/dL (ref 2.5–4.6)
Potassium: 4.2 mmol/L (ref 3.5–5.1)
Sodium: 141 mmol/L (ref 135–145)

## 2018-10-14 LAB — MAGNESIUM: Magnesium: 1.8 mg/dL (ref 1.7–2.4)

## 2018-10-14 MED ORDER — EPOETIN ALFA-EPBX 10000 UNIT/ML IJ SOLN
10000.0000 [IU] | INTRAMUSCULAR | Status: DC
  Administered 2018-10-14: 10000 [IU] via SUBCUTANEOUS
  Filled 2018-10-14: qty 1

## 2018-10-15 LAB — POCT HEMOGLOBIN-HEMACUE: Hemoglobin: 11.1 g/dL — ABNORMAL LOW (ref 12.0–15.0)

## 2018-10-28 ENCOUNTER — Ambulatory Visit (HOSPITAL_COMMUNITY)
Admission: RE | Admit: 2018-10-28 | Discharge: 2018-10-28 | Disposition: A | Discharge status: Home / Self Care | Payer: Medicare Other | Source: Ambulatory Visit | Attending: Nephrology | Admitting: Nephrology

## 2018-10-28 VITALS — BP 136/62 | HR 65 | Temp 97.9°F | Resp 20

## 2018-10-28 DIAGNOSIS — D631 Anemia in chronic kidney disease: Secondary | ICD-10-CM | POA: Insufficient documentation

## 2018-10-28 DIAGNOSIS — N183 Chronic kidney disease, stage 3 (moderate): Secondary | ICD-10-CM | POA: Insufficient documentation

## 2018-10-28 DIAGNOSIS — N179 Acute kidney failure, unspecified: Secondary | ICD-10-CM | POA: Diagnosis not present

## 2018-10-28 DIAGNOSIS — N189 Chronic kidney disease, unspecified: Secondary | ICD-10-CM

## 2018-10-28 LAB — DIFFERENTIAL
Abs Immature Granulocytes: 0.03 10*3/uL (ref 0.00–0.07)
Basophils Absolute: 0.1 10*3/uL (ref 0.0–0.1)
Basophils Relative: 1 %
Eosinophils Absolute: 0.3 10*3/uL (ref 0.0–0.5)
Eosinophils Relative: 3 %
Immature Granulocytes: 0 %
Lymphocytes Relative: 23 %
Lymphs Abs: 2.4 10*3/uL (ref 0.7–4.0)
Monocytes Absolute: 0.6 10*3/uL (ref 0.1–1.0)
Monocytes Relative: 6 %
Neutro Abs: 6.9 10*3/uL (ref 1.7–7.7)
Neutrophils Relative %: 67 %

## 2018-10-28 LAB — IRON AND TIBC
Iron: 94 ug/dL (ref 28–170)
Saturation Ratios: 37 % — ABNORMAL HIGH (ref 10.4–31.8)
TIBC: 255 ug/dL (ref 250–450)
UIBC: 161 ug/dL

## 2018-10-28 LAB — CBC
HCT: 36 % (ref 36.0–46.0)
Hemoglobin: 11.2 g/dL — ABNORMAL LOW (ref 12.0–15.0)
MCH: 29.2 pg (ref 26.0–34.0)
MCHC: 31.1 g/dL (ref 30.0–36.0)
MCV: 93.8 fL (ref 80.0–100.0)
Platelets: 186 10*3/uL (ref 150–400)
RBC: 3.84 MIL/uL — ABNORMAL LOW (ref 3.87–5.11)
RDW: 13.4 % (ref 11.5–15.5)
WBC: 10.3 10*3/uL (ref 4.0–10.5)
nRBC: 0 % (ref 0.0–0.2)

## 2018-10-28 LAB — FERRITIN: Ferritin: 792 ng/mL — ABNORMAL HIGH (ref 11–307)

## 2018-10-28 MED ORDER — EPOETIN ALFA-EPBX 10000 UNIT/ML IJ SOLN
10000.0000 [IU] | INTRAMUSCULAR | Status: DC
  Administered 2018-10-28: 10000 [IU] via SUBCUTANEOUS
  Filled 2018-10-28: qty 1

## 2018-10-30 ENCOUNTER — Encounter: Payer: Self-pay | Admitting: Family Medicine

## 2018-10-30 ENCOUNTER — Other Ambulatory Visit: Payer: Self-pay

## 2018-10-30 ENCOUNTER — Ambulatory Visit (INDEPENDENT_AMBULATORY_CARE_PROVIDER_SITE_OTHER): Payer: Medicare Other | Admitting: Family Medicine

## 2018-10-30 DIAGNOSIS — R432 Parageusia: Secondary | ICD-10-CM | POA: Diagnosis not present

## 2018-10-30 DIAGNOSIS — M545 Low back pain, unspecified: Secondary | ICD-10-CM

## 2018-10-30 NOTE — Assessment & Plan Note (Signed)
No red flags other than age.  Has history of similar pain. Will treat symptomatically but image if not improving.

## 2018-10-30 NOTE — Assessment & Plan Note (Signed)
Suggested could be due Epo (10% incidence of stomatitis) Keep mouth moist.  Discuss with her nephrologist

## 2018-10-30 NOTE — Patient Instructions (Addendum)
Good to see you today!  Thanks for coming in.  For the back pain - Use tylenol and diclofenac as needed - If not better in 4 weeks or worsening then call me - If you have any weakness or trouble controlling your bowel or bladder let us know immediately  Measure your blood pressure at home if regularly > 150/90 then send Dr Vanetta Shawl a message and bring in the readings when you come

## 2018-10-30 NOTE — Progress Notes (Signed)
Subjective  Kimberly Carlson is a 72 y.o. female is presenting with the following  BACK PAIN  Back pain began about 2 weeks ago. Pain is described as achy to sharp. Patient has tried tylenol which helps some. Pain radiates a small distance down back but not to buttock or leg. History of trauma or injury: no But history of chronic intermitent back pain Patient believes might be causing their pain: perhaps kideny  Prior history of similar pain: yes History of cancer: no Weak immune system:  Getting epo shots History of IV drug use: no History of steroid use: no  Symptoms Incontinence of bowel or bladder:  no Numbness of leg: no Fever: no Rest or Night pain: no Weight Loss:  no Rash: no  TASTE LOSS Unsure for how long. Mouth feels dry.   Only new medications is epo injections.  No mouth lesions  ROS see HPI Smoking Status noted.  Chief Complaint noted Review of Symptoms - see HPI PMH - Smoking status noted.    Objective Vital Signs reviewed BP (!) 178/80   Pulse 65   Temp 97.9 F (36.6 C) (Oral)   Ht 5\' 3"  (1.6 m)   Wt 197 lb 12.8 oz (89.7 kg)   LMP  (LMP Unknown)   SpO2 97%   BMI 35.04 kg/m  Mobility:able to get up and down from exam table without assistance or distress ABle to walk on heels and toes and good range of motion of back No SLR Abdomen: soft and non-tender without masses, organomegaly or hernias noted.  No guarding or rebound No CVAT  Skin:  Intact without suspicious lesions or rashes  Mouth - no lesions, mucous membranes are moist, no decaying teeth    Assessments/Plans  See after visit summary for details of patient instuctions  Right-sided low back pain without sciatica No red flags other than age.  Has history of similar pain. Will treat symptomatically but image if not improving.    Loss of taste Suggested could be due Epo (10% incidence of stomatitis) Keep mouth moist.  Discuss with her nephrologist

## 2018-11-11 ENCOUNTER — Ambulatory Visit (HOSPITAL_COMMUNITY)
Admission: RE | Admit: 2018-11-11 | Discharge: 2018-11-11 | Disposition: A | Discharge status: Home / Self Care | Payer: Medicare Other | Source: Ambulatory Visit | Attending: Nephrology | Admitting: Nephrology

## 2018-11-11 VITALS — BP 122/50 | HR 70 | Temp 98.4°F | Resp 20

## 2018-11-11 DIAGNOSIS — D631 Anemia in chronic kidney disease: Secondary | ICD-10-CM | POA: Diagnosis not present

## 2018-11-11 DIAGNOSIS — N179 Acute kidney failure, unspecified: Secondary | ICD-10-CM | POA: Insufficient documentation

## 2018-11-11 DIAGNOSIS — N189 Chronic kidney disease, unspecified: Secondary | ICD-10-CM | POA: Insufficient documentation

## 2018-11-11 LAB — RENAL FUNCTION PANEL
Albumin: 3.4 g/dL — ABNORMAL LOW (ref 3.5–5.0)
Anion gap: 12 (ref 5–15)
BUN: 65 mg/dL — ABNORMAL HIGH (ref 8–23)
CO2: 21 mmol/L — ABNORMAL LOW (ref 22–32)
Calcium: 9.1 mg/dL (ref 8.9–10.3)
Chloride: 106 mmol/L (ref 98–111)
Creatinine, Ser: 2.75 mg/dL — ABNORMAL HIGH (ref 0.44–1.00)
GFR calc Af Amer: 19 mL/min — ABNORMAL LOW (ref >60)
GFR calc non Af Amer: 16 mL/min — ABNORMAL LOW (ref >60)
Glucose, Bld: 263 mg/dL — ABNORMAL HIGH (ref 70–99)
Phosphorus: 5 mg/dL — ABNORMAL HIGH (ref 2.5–4.6)
Potassium: 4.9 mmol/L (ref 3.5–5.1)
Sodium: 139 mmol/L (ref 135–145)

## 2018-11-11 LAB — POCT HEMOGLOBIN-HEMACUE: Hemoglobin: 11.7 g/dL — ABNORMAL LOW (ref 12.0–15.0)

## 2018-11-11 LAB — MAGNESIUM: Magnesium: 1.9 mg/dL (ref 1.7–2.4)

## 2018-11-11 MED ORDER — EPOETIN ALFA-EPBX 10000 UNIT/ML IJ SOLN
10000.0000 [IU] | INTRAMUSCULAR | Status: DC
  Administered 2018-11-11: 10000 [IU] via SUBCUTANEOUS
  Filled 2018-11-11: qty 1

## 2018-11-24 ENCOUNTER — Other Ambulatory Visit: Payer: Self-pay

## 2018-11-24 ENCOUNTER — Encounter: Payer: Self-pay | Admitting: Family Medicine

## 2018-11-24 ENCOUNTER — Ambulatory Visit (INDEPENDENT_AMBULATORY_CARE_PROVIDER_SITE_OTHER): Payer: Medicare Other | Admitting: Family Medicine

## 2018-11-24 VITALS — BP 146/54 | HR 68 | Temp 98.4°F | Ht 63.0 in | Wt 199.0 lb

## 2018-11-24 DIAGNOSIS — I10 Essential (primary) hypertension: Secondary | ICD-10-CM

## 2018-11-24 DIAGNOSIS — Z794 Long term (current) use of insulin: Secondary | ICD-10-CM | POA: Diagnosis not present

## 2018-11-24 DIAGNOSIS — I1A0 Resistant hypertension: Secondary | ICD-10-CM

## 2018-11-24 DIAGNOSIS — N183 Chronic kidney disease, stage 3 unspecified: Secondary | ICD-10-CM

## 2018-11-24 DIAGNOSIS — R200 Anesthesia of skin: Secondary | ICD-10-CM | POA: Diagnosis not present

## 2018-11-24 DIAGNOSIS — E1122 Type 2 diabetes mellitus with diabetic chronic kidney disease: Secondary | ICD-10-CM

## 2018-11-24 DIAGNOSIS — R195 Other fecal abnormalities: Secondary | ICD-10-CM

## 2018-11-24 LAB — POCT GLYCOSYLATED HEMOGLOBIN (HGB A1C): HbA1c, POC (controlled diabetic range): 6.8 % (ref 0.0–7.0)

## 2018-11-24 NOTE — Patient Instructions (Addendum)
Increase lantus to 30 units daily to keep A1C below 7 We'll recheck A1C in 3 months  Please consider getting colonoscopy as your cologuard test was positive which means we should look closer to make sure there's no colon cancer.   If you have questions or concerns please do not hesitate to call at [phone].  Lucila Maine, DO PGY-3, [facility] 11/24/2018 10:01 AM   Low Back Sprain Rehab Ask your health care provider which exercises are safe for you. Do exercises exactly as told by your health care provider and adjust them as directed. It is normal to feel mild stretching, pulling, tightness, or discomfort as you do these exercises, but you should stop right away if you feel sudden pain or your pain gets worse. Do not begin these exercises until told by your health care provider. Stretching and range of motion exercises These exercises warm up your muscles and joints and improve the movement and flexibility of your back. These exercises also help to relieve pain, numbness, and tingling. Exercise A: Lumbar rotation  1. Lie on your back on a firm surface and bend your knees. 2. Straighten your arms out to your sides so each arm forms an "L" shape with a side of your body (a 90 degree angle). 3. Slowly move both of your knees to one side of your body until you feel a stretch in your lower back. Try not to let your shoulders move off of the floor. 4. Hold for __________ seconds. 5. Tense your abdominal muscles and slowly move your knees back to the starting position. 6. Repeat this exercise on the other side of your body. Repeat __________ times. Complete this exercise __________ times a day. Exercise B: Prone extension on elbows  1. Lie on your abdomen on a firm surface. 2. Prop yourself up on your elbows. 3. Use your arms to help lift your chest up until you feel a gentle stretch in your abdomen and your lower back. ? This will place some of your body weight on your  elbows. If this is uncomfortable, try stacking pillows under your chest. ? Your hips should stay down, against the surface that you are lying on. Keep your hip and back muscles relaxed. 4. Hold for __________ seconds. 5. Slowly relax your upper body and return to the starting position. Repeat __________ times. Complete this exercise __________ times a day. Strengthening exercises These exercises build strength and endurance in your back. Endurance is the ability to use your muscles for a long time, even after they get tired. Exercise C: Pelvic tilt 1. Lie on your back on a firm surface. Bend your knees and keep your feet flat. 2. Tense your abdominal muscles. Tip your pelvis up toward the ceiling and flatten your lower back into the floor. ? To help with this exercise, you may place a small towel under your lower back and try to push your back into the towel. 3. Hold for __________ seconds. 4. Let your muscles relax completely before you repeat this exercise. Repeat __________ times. Complete this exercise __________ times a day. Exercise D: Alternating arm and leg raises  1. Get on your hands and knees on a firm surface. If you are on a hard floor, you may want to use padding to cushion your knees, such as an exercise mat. 2. Line up your arms and legs. Your hands should be below your shoulders, and your knees should be below your hips. 3. Lift your left leg  behind you. At the same time, raise your right arm and straighten it in front of you. ? Do not lift your leg higher than your hip. ? Do not lift your arm higher than your shoulder. ? Keep your abdominal and back muscles tight. ? Keep your hips facing the ground. ? Do not arch your back. ? Keep your balance carefully, and do not hold your breath. 4. Hold for __________ seconds. 5. Slowly return to the starting position and repeat with your right leg and your left arm. Repeat __________ times. Complete this exercise __________ times a  day. Exercise E: Abdominal set with straight leg raise  1. Lie on your back on a firm surface. 2. Bend one of your knees and keep your other leg straight. 3. Tense your abdominal muscles and lift your straight leg up, 4-6 inches (10-15 cm) off the ground. 4. Keep your abdominal muscles tight and hold for __________ seconds. ? Do not hold your breath. ? Do not arch your back. Keep it flat against the ground. 5. Keep your abdominal muscles tense as you slowly lower your leg back to the starting position. 6. Repeat with your other leg. Repeat __________ times. Complete this exercise __________ times a day. Posture and body mechanics  Body mechanics refers to the movements and positions of your body while you do your daily activities. Posture is part of body mechanics. Good posture and healthy body mechanics can help to relieve stress in your body's tissues and joints. Good posture means that your spine is in its natural S-curve position (your spine is neutral), your shoulders are pulled back slightly, and your head is not tipped forward. The following are general guidelines for applying improved posture and body mechanics to your everyday activities. Standing   When standing, keep your spine neutral and your feet about hip-width apart. Keep a slight bend in your knees. Your ears, shoulders, and hips should line up.  When you do a task in which you stand in one place for a long time, place one foot up on a stable object that is 2-4 inches (5-10 cm) high, such as a footstool. This helps keep your spine neutral. Sitting   When sitting, keep your spine neutral and keep your feet flat on the floor. Use a footrest, if necessary, and keep your thighs parallel to the floor. Avoid rounding your shoulders, and avoid tilting your head forward.  When working at a desk or a computer, keep your desk at a height where your hands are slightly lower than your elbows. Slide your chair under your desk so you are  close enough to maintain good posture.  When working at a computer, place your monitor at a height where you are looking straight ahead and you do not have to tilt your head forward or downward to look at the screen. Resting   When lying down and resting, avoid positions that are most painful for you.  If you have pain with activities such as sitting, bending, stooping, or squatting (flexion-based activities), lie in a position in which your body does not bend very much. For example, avoid curling up on your side with your arms and knees near your chest (fetal position).  If you have pain with activities such as standing for a long time or reaching with your arms (extension-based activities), lie with your spine in a neutral position and bend your knees slightly. Try the following positions:  Lying on your side with a pillow between your  knees.  Lying on your back with a pillow under your knees. Lifting   When lifting objects, keep your feet at least shoulder-width apart and tighten your abdominal muscles.  Bend your knees and hips and keep your spine neutral. It is important to lift using the strength of your legs, not your back. Do not lock your knees straight out.  Always ask for help to lift heavy or awkward objects. This information is not intended to replace advice given to you by your health care provider. Make sure you discuss any questions you have with your health care provider. Document Released: 11/12/2005 Document Revised: 07/19/2016 Document Reviewed: 08/24/2015 Elsevier Interactive Patient Education  2019 [employer].

## 2018-11-24 NOTE — Assessment & Plan Note (Signed)
  Intermittent, negative tinel testing, strength in tact bilaterally, sensation in tact. Unclear cause, patient has had gout in past in right wrist but no pain currently, no swelling appreciated. Likely positional. Follow up if worsens or persists, could consider nerve testing

## 2018-11-24 NOTE — Assessment & Plan Note (Signed)
  Due for eye exam which she states she will make appt for. A1C 6.8 today from 6.3 last time. Will increase lantus  From 28 u to 30 u daily. At goal of <7. Follow up 3 months

## 2018-11-24 NOTE — Progress Notes (Signed)
    Subjective:    Patient ID: Kimberly Carlson, female    DOB: [birth year], 72 y.o.   MRN: [id number]   CC: follow up DM, hand numbness  DM- reports she is taking 28 units lantus daily. She checks sugars at home "occasionally". Reports numbers usually around 130. Denies symptoms of hypoglycemia. Denies increased thirst, hunger, or urination  HTN- reports compliance with medication. Denies CP, SOB, DOE  Reports both of her hands intermittently feeling numb and tingling over the past week. Unrelated to time of day or activity. Shaking hands makes it feel better. No weakness.   Health maintenance: declines flu, pneumonia vaccine, dexa scan. Had tetanus March 2015.  Did stool kit about a year ago, results were positive, patient adamantly declines colonoscopy despite the risk for having an undiagnosed colon cancer.   Smoking status reviewed- former smoker  Review of Systems- see HPI   Objective:  BP (!) 146/54   Pulse 68   Temp 98.4 F (36.9 C) (Oral)   Ht '5\' 3"'$  (1.6 m)   Wt 199 lb (90.3 kg)   LMP  (LMP Unknown)   SpO2 95%   BMI 35.25 kg/m  Vitals and nursing note reviewed  General: well nourished, in no acute distress HEENT: normocephalic, MMM Cardiac: RRR, clear S1 and S2, no murmurs, rubs, or gallops Respiratory: clear to auscultation bilaterally, no increased work of breathing Extremities: no edema or cyanosis.  Skin: warm and dry, no rashes noted Neuro: alert and oriented, no focal deficits. Strength 5/5 in upper extremities bilaterally    Assessment & Plan:    Positive colorectal cancer screening using Cologuard test  Discussed results with patient from last years Cologuard screening and recommended strongly she get a colonoscopy. Patient declined. Discussed concern she may have colon cancer that is undiagnosed. She again states she does not want colonoscopy.   DM2 (diabetes mellitus, type 2) ([place])  Due for eye exam which she states she will make appt for. A1C  6.8 today from 6.3 last time. Will increase lantus  From 28 u to 30 u daily. At goal of <7. Follow up 3 months  Resistant hypertension  At goal <150/90 today, continue current regimen  Bilateral hand numbness  Intermittent, negative tinel testing, strength in tact bilaterally, sensation in tact. Unclear cause, patient has had gout in past in right wrist but no pain currently, no swelling appreciated. Likely positional. Follow up if worsens or persists, could consider nerve testing    Return in about 3 months (around 02/23/2019).   Lucila Maine, DO Family Medicine Resident PGY-3

## 2018-11-24 NOTE — Assessment & Plan Note (Signed)
  At goal <150/90 today, continue current regimen

## 2018-11-24 NOTE — Assessment & Plan Note (Signed)
  Discussed results with patient from last years Cologuard screening and recommended strongly she get a colonoscopy. Patient declined. Discussed concern she may have colon cancer that is undiagnosed. She again states she does not want colonoscopy.

## 2018-11-24 NOTE — Progress Notes (Signed)
.  h 

## 2018-11-25 ENCOUNTER — Ambulatory Visit (HOSPITAL_COMMUNITY)
Admission: RE | Admit: 2018-11-25 | Discharge: 2018-11-25 | Disposition: A | Discharge status: Home / Self Care | Payer: Medicare Other | Source: Ambulatory Visit | Attending: Nephrology | Admitting: Nephrology

## 2018-11-25 VITALS — BP 149/61 | HR 64

## 2018-11-25 DIAGNOSIS — N189 Chronic kidney disease, unspecified: Secondary | ICD-10-CM | POA: Diagnosis not present

## 2018-11-25 DIAGNOSIS — N179 Acute kidney failure, unspecified: Secondary | ICD-10-CM | POA: Diagnosis not present

## 2018-11-25 DIAGNOSIS — D631 Anemia in chronic kidney disease: Secondary | ICD-10-CM | POA: Diagnosis not present

## 2018-11-25 LAB — POCT HEMOGLOBIN-HEMACUE: Hemoglobin: 11.6 g/dL — ABNORMAL LOW (ref 12.0–15.0)

## 2018-11-25 MED ORDER — EPOETIN ALFA-EPBX 10000 UNIT/ML IJ SOLN
10000.0000 [IU] | INTRAMUSCULAR | Status: DC
  Administered 2018-11-25: 10000 [IU] via SUBCUTANEOUS
  Filled 2018-11-25: qty 1

## 2018-11-30 ENCOUNTER — Other Ambulatory Visit: Payer: Self-pay | Admitting: Internal Medicine

## 2018-11-30 ENCOUNTER — Encounter: Payer: Self-pay | Admitting: Family Medicine

## 2018-11-30 DIAGNOSIS — E7800 Pure hypercholesterolemia, unspecified: Secondary | ICD-10-CM

## 2018-11-30 DIAGNOSIS — E1165 Type 2 diabetes mellitus with hyperglycemia: Secondary | ICD-10-CM

## 2018-11-30 DIAGNOSIS — Z794 Long term (current) use of insulin: Secondary | ICD-10-CM

## 2018-12-01 MED ORDER — INSULIN GLARGINE 100 UNIT/ML ~~LOC~~ SOLN: SUBCUTANEOUS | 2 refills | Status: DC

## 2018-12-01 MED ORDER — ATORVASTATIN CALCIUM 40 MG PO TABS: ORAL_TABLET | ORAL | 3 refills | Status: DC

## 2018-12-09 ENCOUNTER — Ambulatory Visit (HOSPITAL_COMMUNITY)
Admission: RE | Admit: 2018-12-09 | Discharge: 2018-12-09 | Disposition: A | Discharge status: Home / Self Care | Payer: Medicare Other | Source: Ambulatory Visit | Attending: Nephrology | Admitting: Nephrology

## 2018-12-09 VITALS — BP 155/62 | HR 65 | Temp 98.6°F | Resp 20

## 2018-12-09 DIAGNOSIS — N179 Acute kidney failure, unspecified: Secondary | ICD-10-CM | POA: Insufficient documentation

## 2018-12-09 DIAGNOSIS — D631 Anemia in chronic kidney disease: Secondary | ICD-10-CM | POA: Diagnosis not present

## 2018-12-09 DIAGNOSIS — N189 Chronic kidney disease, unspecified: Secondary | ICD-10-CM | POA: Diagnosis not present

## 2018-12-09 LAB — DIFFERENTIAL
Abs Immature Granulocytes: 0.05 10*3/uL (ref 0.00–0.07)
Basophils Absolute: 0 10*3/uL (ref 0.0–0.1)
Basophils Relative: 0 %
Eosinophils Absolute: 0.4 10*3/uL (ref 0.0–0.5)
Eosinophils Relative: 3 %
Immature Granulocytes: 0 %
Lymphocytes Relative: 23 %
Lymphs Abs: 2.6 10*3/uL (ref 0.7–4.0)
Monocytes Absolute: 0.8 10*3/uL (ref 0.1–1.0)
Monocytes Relative: 7 %
Neutro Abs: 7.5 10*3/uL (ref 1.7–7.7)
Neutrophils Relative %: 67 %

## 2018-12-09 LAB — IRON AND TIBC
Iron: 74 ug/dL (ref 28–170)
Saturation Ratios: 29 % (ref 10.4–31.8)
TIBC: 255 ug/dL (ref 250–450)
UIBC: 181 ug/dL

## 2018-12-09 LAB — CBC
HCT: 35.4 % — ABNORMAL LOW (ref 36.0–46.0)
Hemoglobin: 11.1 g/dL — ABNORMAL LOW (ref 12.0–15.0)
MCH: 30.4 pg (ref 26.0–34.0)
MCHC: 31.4 g/dL (ref 30.0–36.0)
MCV: 97 fL (ref 80.0–100.0)
Platelets: 169 10*3/uL (ref 150–400)
RBC: 3.65 MIL/uL — ABNORMAL LOW (ref 3.87–5.11)
RDW: 13.1 % (ref 11.5–15.5)
WBC: 11.3 10*3/uL — ABNORMAL HIGH (ref 4.0–10.5)
nRBC: 0 % (ref 0.0–0.2)

## 2018-12-09 LAB — RENAL FUNCTION PANEL
Albumin: 3.4 g/dL — ABNORMAL LOW (ref 3.5–5.0)
Anion gap: 11 (ref 5–15)
BUN: 72 mg/dL — ABNORMAL HIGH (ref 8–23)
CO2: 20 mmol/L — ABNORMAL LOW (ref 22–32)
Calcium: 9.5 mg/dL (ref 8.9–10.3)
Chloride: 110 mmol/L (ref 98–111)
Creatinine, Ser: 3.03 mg/dL — ABNORMAL HIGH (ref 0.44–1.00)
GFR calc Af Amer: 17 mL/min — ABNORMAL LOW (ref >60)
GFR calc non Af Amer: 14 mL/min — ABNORMAL LOW (ref >60)
Glucose, Bld: 178 mg/dL — ABNORMAL HIGH (ref 70–99)
Phosphorus: 4.4 mg/dL (ref 2.5–4.6)
Potassium: 4.6 mmol/L (ref 3.5–5.1)
Sodium: 141 mmol/L (ref 135–145)

## 2018-12-09 LAB — POCT HEMOGLOBIN-HEMACUE: Hemoglobin: 11.4 g/dL — ABNORMAL LOW (ref 12.0–15.0)

## 2018-12-09 LAB — FERRITIN: Ferritin: 590 ng/mL — ABNORMAL HIGH (ref 11–307)

## 2018-12-09 LAB — MAGNESIUM: Magnesium: 1.7 mg/dL (ref 1.7–2.4)

## 2018-12-09 MED ORDER — EPOETIN ALFA-EPBX 10000 UNIT/ML IJ SOLN
10000.0000 [IU] | INTRAMUSCULAR | Status: DC
  Administered 2018-12-09: 10000 [IU] via SUBCUTANEOUS
  Filled 2018-12-09: qty 1

## 2018-12-23 ENCOUNTER — Ambulatory Visit (HOSPITAL_COMMUNITY)
Admission: RE | Admit: 2018-12-23 | Discharge: 2018-12-23 | Disposition: A | Discharge status: Home / Self Care | Payer: Medicare Other | Source: Ambulatory Visit | Attending: Nephrology | Admitting: Nephrology

## 2018-12-23 VITALS — BP 130/56 | HR 62 | Temp 98.0°F | Resp 20

## 2018-12-23 DIAGNOSIS — N179 Acute kidney failure, unspecified: Secondary | ICD-10-CM | POA: Insufficient documentation

## 2018-12-23 DIAGNOSIS — N189 Chronic kidney disease, unspecified: Secondary | ICD-10-CM | POA: Insufficient documentation

## 2018-12-23 DIAGNOSIS — D631 Anemia in chronic kidney disease: Secondary | ICD-10-CM | POA: Insufficient documentation

## 2018-12-23 LAB — POCT HEMOGLOBIN-HEMACUE: Hemoglobin: 11.1 g/dL — ABNORMAL LOW (ref 12.0–15.0)

## 2018-12-23 MED ORDER — EPOETIN ALFA-EPBX 10000 UNIT/ML IJ SOLN
10000.0000 [IU] | INTRAMUSCULAR | Status: DC
  Administered 2018-12-23: 10000 [IU] via SUBCUTANEOUS
  Filled 2018-12-23: qty 1

## 2018-12-25 DIAGNOSIS — D631 Anemia in chronic kidney disease: Secondary | ICD-10-CM | POA: Diagnosis not present

## 2018-12-25 DIAGNOSIS — I129 Hypertensive chronic kidney disease with stage 1 through stage 4 chronic kidney disease, or unspecified chronic kidney disease: Secondary | ICD-10-CM | POA: Diagnosis not present

## 2018-12-25 DIAGNOSIS — N184 Chronic kidney disease, stage 4 (severe): Secondary | ICD-10-CM | POA: Diagnosis not present

## 2018-12-25 DIAGNOSIS — N2581 Secondary hyperparathyroidism of renal origin: Secondary | ICD-10-CM | POA: Diagnosis not present

## 2018-12-27 IMAGING — DX DG WRIST COMPLETE 3+V*R*
4 series · 4 of 4 positions shown · non-contrast
Comparison: None.

CLINICAL DATA: 72-year-old female with a history of right wrist
pain and swelling

EXAM:
RIGHT WRIST - COMPLETE 3+ VIEW

[wrist pa]
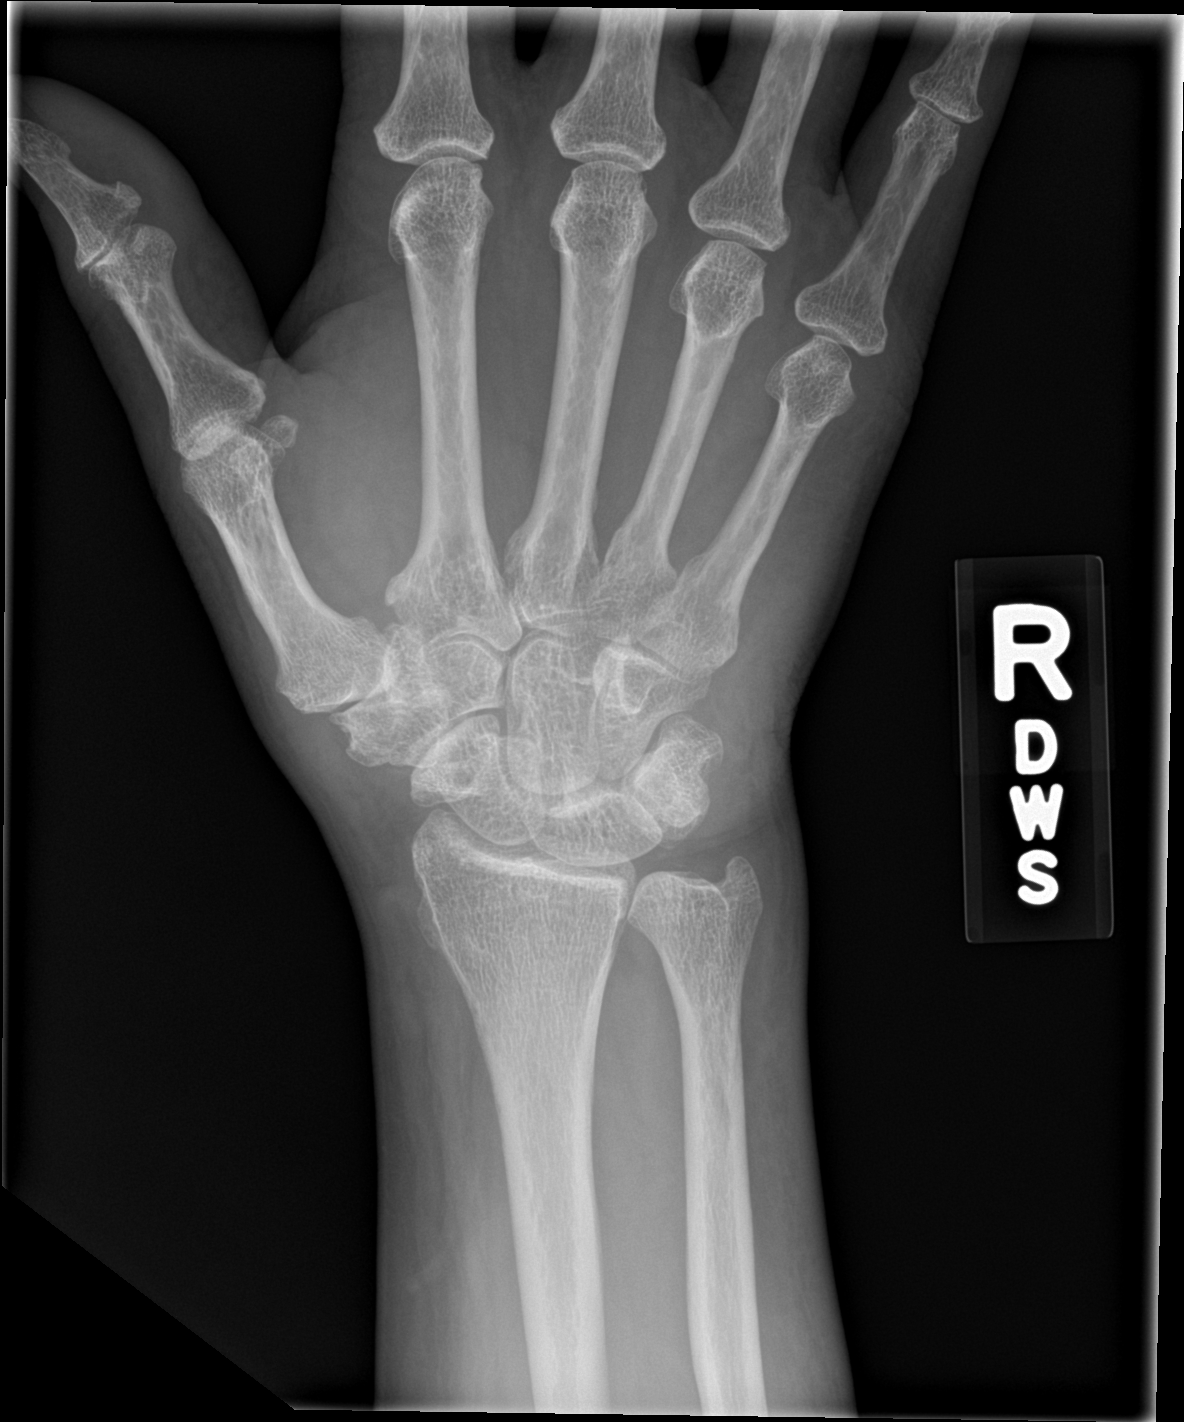

[wrist navicular]
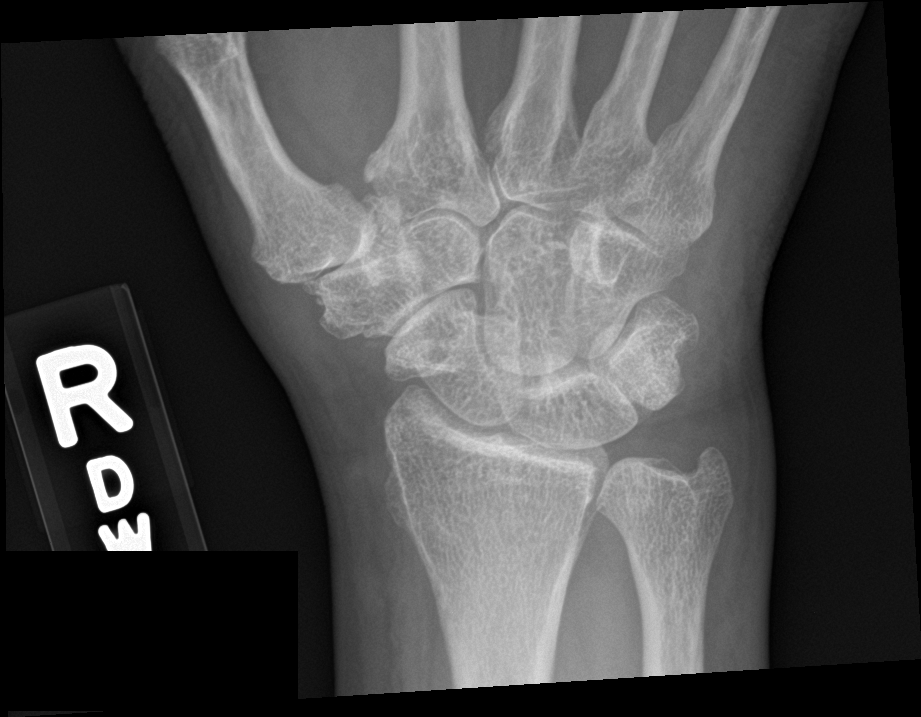

[wrist obl]
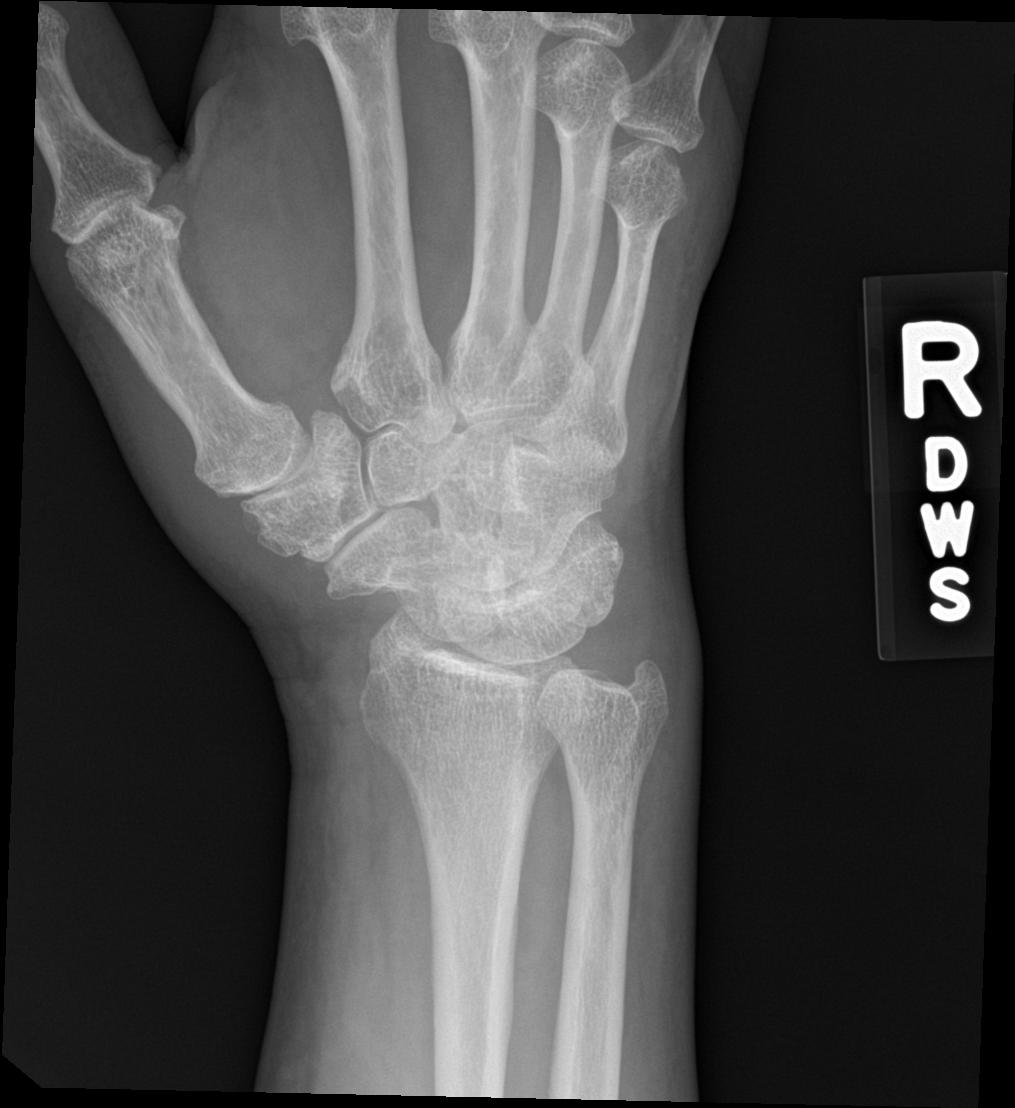

[wrist lat]
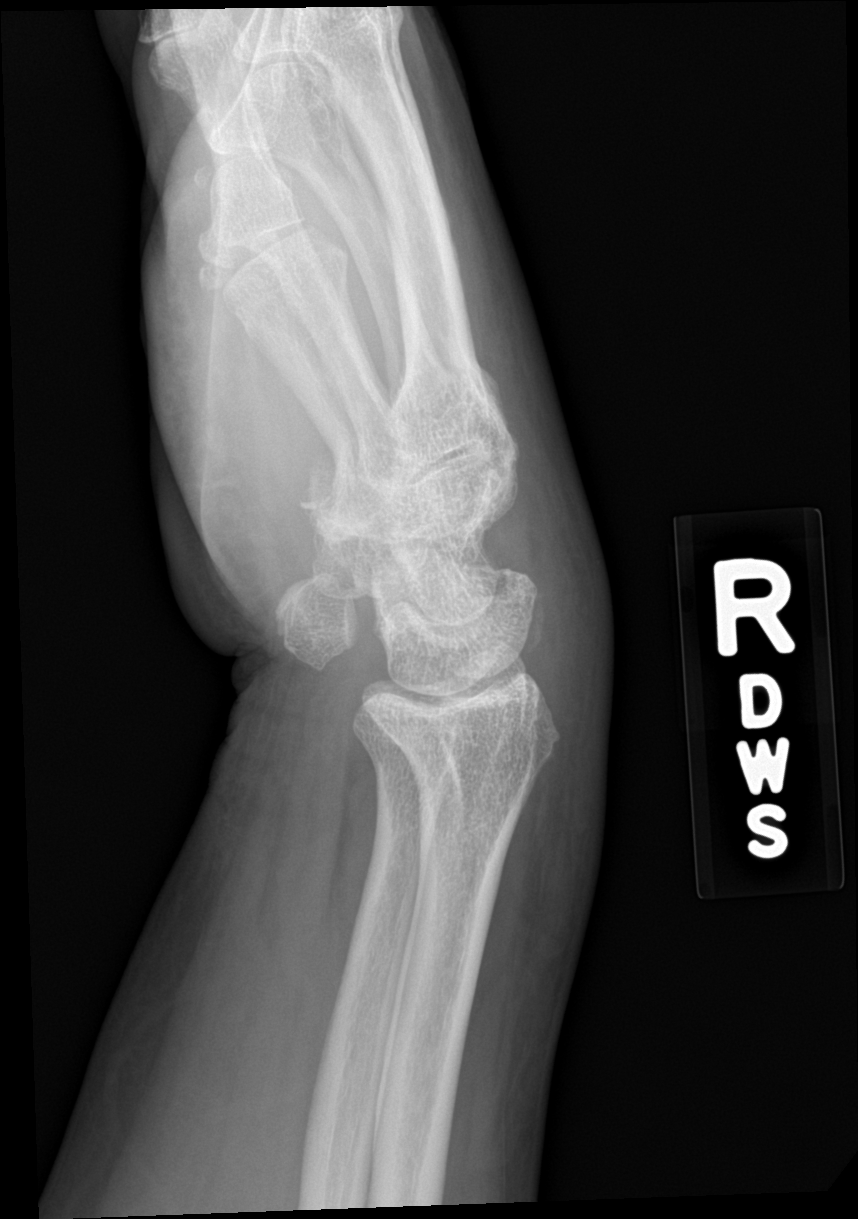

[4 of 4 positions shown; findings below may reference images not displayed]

FINDINGS: Osteopenia. Frontal view demonstrates carpal alignment maintained.
Degenerative changes of the first carpometacarpal joint and at the
radiocarpal joint.

Unremarkable appearance of the distal radius and ulna.

The prominence of the pisiform on the lateral view is favored to be
a consequence of rotation.
IMPRESSION: Negative for acute fracture of the wrist.

Degenerative changes of the carpometacarpal joint.

The prominence of the pisiform on the lateral view is favored to be
a consequence of significant rotation.

## 2019-01-06 ENCOUNTER — Encounter (HOSPITAL_COMMUNITY)
Admission: RE | Admit: 2019-01-06 | Discharge: 2019-01-06 | Disposition: A | Discharge status: Home / Self Care | Payer: Medicare Other | Source: Ambulatory Visit | Attending: Nephrology | Admitting: Nephrology

## 2019-01-06 VITALS — BP 144/61 | HR 66 | Temp 98.2°F | Resp 20

## 2019-01-06 DIAGNOSIS — N179 Acute kidney failure, unspecified: Secondary | ICD-10-CM | POA: Insufficient documentation

## 2019-01-06 DIAGNOSIS — D631 Anemia in chronic kidney disease: Secondary | ICD-10-CM | POA: Insufficient documentation

## 2019-01-06 DIAGNOSIS — N189 Chronic kidney disease, unspecified: Secondary | ICD-10-CM | POA: Diagnosis not present

## 2019-01-06 LAB — DIFFERENTIAL
Abs Immature Granulocytes: 0.03 10*3/uL (ref 0.00–0.07)
Basophils Absolute: 0 10*3/uL (ref 0.0–0.1)
Basophils Relative: 0 %
Eosinophils Absolute: 0.4 10*3/uL (ref 0.0–0.5)
Eosinophils Relative: 4 %
Immature Granulocytes: 0 %
Lymphocytes Relative: 25 %
Lymphs Abs: 2.5 10*3/uL (ref 0.7–4.0)
Monocytes Absolute: 0.6 10*3/uL (ref 0.1–1.0)
Monocytes Relative: 7 %
Neutro Abs: 6.3 10*3/uL (ref 1.7–7.7)
Neutrophils Relative %: 64 %

## 2019-01-06 LAB — RENAL FUNCTION PANEL
Albumin: 3.5 g/dL (ref 3.5–5.0)
Anion gap: 11 (ref 5–15)
BUN: 74 mg/dL — ABNORMAL HIGH (ref 8–23)
CO2: 20 mmol/L — ABNORMAL LOW (ref 22–32)
Calcium: 9.3 mg/dL (ref 8.9–10.3)
Chloride: 107 mmol/L (ref 98–111)
Creatinine, Ser: 2.99 mg/dL — ABNORMAL HIGH (ref 0.44–1.00)
GFR calc Af Amer: 17 mL/min — ABNORMAL LOW (ref >60)
GFR calc non Af Amer: 15 mL/min — ABNORMAL LOW (ref >60)
Glucose, Bld: 192 mg/dL — ABNORMAL HIGH (ref 70–99)
Phosphorus: 5.1 mg/dL — ABNORMAL HIGH (ref 2.5–4.6)
Potassium: 4.9 mmol/L (ref 3.5–5.1)
Sodium: 138 mmol/L (ref 135–145)

## 2019-01-06 LAB — CBC
HCT: 36.1 % (ref 36.0–46.0)
Hemoglobin: 11.4 g/dL — ABNORMAL LOW (ref 12.0–15.0)
MCH: 30.6 pg (ref 26.0–34.0)
MCHC: 31.6 g/dL (ref 30.0–36.0)
MCV: 96.8 fL (ref 80.0–100.0)
Platelets: 164 10*3/uL (ref 150–400)
RBC: 3.73 MIL/uL — ABNORMAL LOW (ref 3.87–5.11)
RDW: 12.6 % (ref 11.5–15.5)
WBC: 9.8 10*3/uL (ref 4.0–10.5)
nRBC: 0 % (ref 0.0–0.2)

## 2019-01-06 LAB — IRON AND TIBC
Iron: 80 ug/dL (ref 28–170)
Saturation Ratios: 33 % — ABNORMAL HIGH (ref 10.4–31.8)
TIBC: 241 ug/dL — ABNORMAL LOW (ref 250–450)
UIBC: 161 ug/dL

## 2019-01-06 LAB — POCT HEMOGLOBIN-HEMACUE: Hemoglobin: 11.3 g/dL — ABNORMAL LOW (ref 12.0–15.0)

## 2019-01-06 LAB — MAGNESIUM: Magnesium: 1.8 mg/dL (ref 1.7–2.4)

## 2019-01-06 LAB — FERRITIN: Ferritin: 584 ng/mL — ABNORMAL HIGH (ref 11–307)

## 2019-01-06 MED ORDER — EPOETIN ALFA-EPBX 10000 UNIT/ML IJ SOLN
10000.0000 [IU] | INTRAMUSCULAR | Status: DC
  Administered 2019-01-06: 10000 [IU] via SUBCUTANEOUS
  Filled 2019-01-06: qty 1

## 2019-01-20 ENCOUNTER — Ambulatory Visit (HOSPITAL_COMMUNITY)
Admission: RE | Admit: 2019-01-20 | Discharge: 2019-01-20 | Disposition: A | Discharge status: Home / Self Care | Payer: Medicare Other | Source: Ambulatory Visit | Attending: Nephrology | Admitting: Nephrology

## 2019-01-20 VITALS — BP 153/59 | HR 67 | Temp 98.6°F | Resp 20

## 2019-01-20 DIAGNOSIS — D631 Anemia in chronic kidney disease: Secondary | ICD-10-CM | POA: Diagnosis not present

## 2019-01-20 DIAGNOSIS — N189 Chronic kidney disease, unspecified: Secondary | ICD-10-CM | POA: Diagnosis not present

## 2019-01-20 DIAGNOSIS — N179 Acute kidney failure, unspecified: Secondary | ICD-10-CM | POA: Insufficient documentation

## 2019-01-20 LAB — POCT HEMOGLOBIN-HEMACUE: Hemoglobin: 11.5 g/dL — ABNORMAL LOW (ref 12.0–15.0)

## 2019-01-20 MED ORDER — EPOETIN ALFA-EPBX 10000 UNIT/ML IJ SOLN
10000.0000 [IU] | INTRAMUSCULAR | Status: DC
  Administered 2019-01-20: 10000 [IU] via SUBCUTANEOUS
  Filled 2019-01-20: qty 1

## 2019-02-03 ENCOUNTER — Other Ambulatory Visit: Payer: Self-pay

## 2019-02-03 ENCOUNTER — Ambulatory Visit (HOSPITAL_COMMUNITY)
Admission: RE | Admit: 2019-02-03 | Discharge: 2019-02-03 | Disposition: A | Discharge status: Home / Self Care | Payer: Medicare Other | Source: Ambulatory Visit | Attending: Nephrology | Admitting: Nephrology

## 2019-02-03 VITALS — BP 149/50 | HR 62 | Resp 18

## 2019-02-03 DIAGNOSIS — N179 Acute kidney failure, unspecified: Secondary | ICD-10-CM | POA: Diagnosis not present

## 2019-02-03 DIAGNOSIS — N189 Chronic kidney disease, unspecified: Secondary | ICD-10-CM | POA: Diagnosis not present

## 2019-02-03 DIAGNOSIS — D631 Anemia in chronic kidney disease: Secondary | ICD-10-CM | POA: Diagnosis not present

## 2019-02-03 LAB — RENAL FUNCTION PANEL
Albumin: 3.5 g/dL (ref 3.5–5.0)
Anion gap: 8 (ref 5–15)
BUN: 54 mg/dL — ABNORMAL HIGH (ref 8–23)
CO2: 24 mmol/L (ref 22–32)
Calcium: 9.6 mg/dL (ref 8.9–10.3)
Chloride: 109 mmol/L (ref 98–111)
Creatinine, Ser: 2.28 mg/dL — ABNORMAL HIGH (ref 0.44–1.00)
GFR calc Af Amer: 24 mL/min — ABNORMAL LOW (ref >60)
GFR calc non Af Amer: 20 mL/min — ABNORMAL LOW (ref >60)
Glucose, Bld: 113 mg/dL — ABNORMAL HIGH (ref 70–99)
Phosphorus: 4.7 mg/dL — ABNORMAL HIGH (ref 2.5–4.6)
Potassium: 4.8 mmol/L (ref 3.5–5.1)
Sodium: 141 mmol/L (ref 135–145)

## 2019-02-03 LAB — DIFFERENTIAL
Abs Immature Granulocytes: 0.05 10*3/uL (ref 0.00–0.07)
Basophils Absolute: 0.1 10*3/uL (ref 0.0–0.1)
Basophils Relative: 1 %
Eosinophils Absolute: 0.4 10*3/uL (ref 0.0–0.5)
Eosinophils Relative: 4 %
Immature Granulocytes: 1 %
Lymphocytes Relative: 24 %
Lymphs Abs: 2.4 10*3/uL (ref 0.7–4.0)
Monocytes Absolute: 0.6 10*3/uL (ref 0.1–1.0)
Monocytes Relative: 6 %
Neutro Abs: 6.5 10*3/uL (ref 1.7–7.7)
Neutrophils Relative %: 64 %

## 2019-02-03 LAB — IRON AND TIBC
Iron: 96 ug/dL (ref 28–170)
Saturation Ratios: 40 % — ABNORMAL HIGH (ref 10.4–31.8)
TIBC: 239 ug/dL — ABNORMAL LOW (ref 250–450)
UIBC: 143 ug/dL

## 2019-02-03 LAB — CBC
HCT: 35.7 % — ABNORMAL LOW (ref 36.0–46.0)
Hemoglobin: 11 g/dL — ABNORMAL LOW (ref 12.0–15.0)
MCH: 29.8 pg (ref 26.0–34.0)
MCHC: 30.8 g/dL (ref 30.0–36.0)
MCV: 96.7 fL (ref 80.0–100.0)
Platelets: 165 10*3/uL (ref 150–400)
RBC: 3.69 MIL/uL — ABNORMAL LOW (ref 3.87–5.11)
RDW: 12.4 % (ref 11.5–15.5)
WBC: 10 10*3/uL (ref 4.0–10.5)
nRBC: 0 % (ref 0.0–0.2)

## 2019-02-03 LAB — MAGNESIUM: Magnesium: 1.7 mg/dL (ref 1.7–2.4)

## 2019-02-03 LAB — POCT HEMOGLOBIN-HEMACUE: Hemoglobin: 11.4 g/dL — ABNORMAL LOW (ref 12.0–15.0)

## 2019-02-03 LAB — FERRITIN: Ferritin: 631 ng/mL — ABNORMAL HIGH (ref 11–307)

## 2019-02-03 MED ORDER — EPOETIN ALFA-EPBX 10000 UNIT/ML IJ SOLN
10000.0000 [IU] | INTRAMUSCULAR | Status: DC
  Administered 2019-02-03: 10000 [IU] via SUBCUTANEOUS
  Filled 2019-02-03: qty 1

## 2019-02-17 ENCOUNTER — Other Ambulatory Visit: Payer: Self-pay

## 2019-02-17 ENCOUNTER — Ambulatory Visit (HOSPITAL_COMMUNITY)
Admission: RE | Admit: 2019-02-17 | Discharge: 2019-02-17 | Disposition: A | Discharge status: Home / Self Care | Payer: Medicare Other | Source: Ambulatory Visit | Attending: Nephrology | Admitting: Nephrology

## 2019-02-17 VITALS — BP 165/60 | HR 63 | Temp 98.4°F | Resp 20

## 2019-02-17 DIAGNOSIS — N179 Acute kidney failure, unspecified: Secondary | ICD-10-CM | POA: Diagnosis not present

## 2019-02-17 DIAGNOSIS — D631 Anemia in chronic kidney disease: Secondary | ICD-10-CM | POA: Diagnosis not present

## 2019-02-17 DIAGNOSIS — N189 Chronic kidney disease, unspecified: Secondary | ICD-10-CM | POA: Insufficient documentation

## 2019-02-17 LAB — POCT HEMOGLOBIN-HEMACUE: Hemoglobin: 11 g/dL — ABNORMAL LOW (ref 12.0–15.0)

## 2019-02-17 MED ORDER — EPOETIN ALFA-EPBX 10000 UNIT/ML IJ SOLN
10000.0000 [IU] | INTRAMUSCULAR | Status: DC
  Administered 2019-02-17: 10000 [IU] via SUBCUTANEOUS
  Filled 2019-02-17: qty 1

## 2019-03-03 ENCOUNTER — Other Ambulatory Visit: Payer: Self-pay

## 2019-03-03 ENCOUNTER — Ambulatory Visit (HOSPITAL_COMMUNITY)
Admission: RE | Admit: 2019-03-03 | Discharge: 2019-03-03 | Disposition: A | Discharge status: Home / Self Care | Payer: Medicare Other | Source: Ambulatory Visit | Attending: Nephrology | Admitting: Nephrology

## 2019-03-03 VITALS — BP 168/61 | HR 77 | Temp 97.5°F | Resp 77

## 2019-03-03 DIAGNOSIS — N179 Acute kidney failure, unspecified: Secondary | ICD-10-CM | POA: Insufficient documentation

## 2019-03-03 DIAGNOSIS — D631 Anemia in chronic kidney disease: Secondary | ICD-10-CM | POA: Insufficient documentation

## 2019-03-03 DIAGNOSIS — N189 Chronic kidney disease, unspecified: Secondary | ICD-10-CM | POA: Diagnosis not present

## 2019-03-03 LAB — MAGNESIUM: Magnesium: 1.7 mg/dL (ref 1.7–2.4)

## 2019-03-03 LAB — RENAL FUNCTION PANEL
Albumin: 3.6 g/dL (ref 3.5–5.0)
Anion gap: 11 (ref 5–15)
BUN: 55 mg/dL — ABNORMAL HIGH (ref 8–23)
CO2: 20 mmol/L — ABNORMAL LOW (ref 22–32)
Calcium: 9.5 mg/dL (ref 8.9–10.3)
Chloride: 110 mmol/L (ref 98–111)
Creatinine, Ser: 2.31 mg/dL — ABNORMAL HIGH (ref 0.44–1.00)
GFR calc Af Amer: 23 mL/min — ABNORMAL LOW (ref >60)
GFR calc non Af Amer: 20 mL/min — ABNORMAL LOW (ref >60)
Glucose, Bld: 152 mg/dL — ABNORMAL HIGH (ref 70–99)
Phosphorus: 4.6 mg/dL (ref 2.5–4.6)
Potassium: 4.5 mmol/L (ref 3.5–5.1)
Sodium: 141 mmol/L (ref 135–145)

## 2019-03-03 LAB — POCT HEMOGLOBIN-HEMACUE: Hemoglobin: 11.1 g/dL — ABNORMAL LOW (ref 12.0–15.0)

## 2019-03-03 LAB — IRON AND TIBC
Iron: 73 ug/dL (ref 28–170)
Saturation Ratios: 30 % (ref 10.4–31.8)
TIBC: 244 ug/dL — ABNORMAL LOW (ref 250–450)
UIBC: 171 ug/dL

## 2019-03-03 LAB — CBC
HCT: 35.6 % — ABNORMAL LOW (ref 36.0–46.0)
Hemoglobin: 10.8 g/dL — ABNORMAL LOW (ref 12.0–15.0)
MCH: 29.2 pg (ref 26.0–34.0)
MCHC: 30.3 g/dL (ref 30.0–36.0)
MCV: 96.2 fL (ref 80.0–100.0)
Platelets: 180 10*3/uL (ref 150–400)
RBC: 3.7 MIL/uL — ABNORMAL LOW (ref 3.87–5.11)
RDW: 12.7 % (ref 11.5–15.5)
WBC: 11.9 10*3/uL — ABNORMAL HIGH (ref 4.0–10.5)
nRBC: 0 % (ref 0.0–0.2)

## 2019-03-03 LAB — DIFFERENTIAL
Abs Immature Granulocytes: 0.04 10*3/uL (ref 0.00–0.07)
Basophils Absolute: 0.1 10*3/uL (ref 0.0–0.1)
Basophils Relative: 0 %
Eosinophils Absolute: 0.3 10*3/uL (ref 0.0–0.5)
Eosinophils Relative: 3 %
Immature Granulocytes: 0 %
Lymphocytes Relative: 22 %
Lymphs Abs: 2.6 10*3/uL (ref 0.7–4.0)
Monocytes Absolute: 0.7 10*3/uL (ref 0.1–1.0)
Monocytes Relative: 6 %
Neutro Abs: 8.2 10*3/uL — ABNORMAL HIGH (ref 1.7–7.7)
Neutrophils Relative %: 69 %

## 2019-03-03 LAB — FERRITIN: Ferritin: 618 ng/mL — ABNORMAL HIGH (ref 11–307)

## 2019-03-03 MED ORDER — EPOETIN ALFA-EPBX 10000 UNIT/ML IJ SOLN
10000.0000 [IU] | INTRAMUSCULAR | Status: DC
  Administered 2019-03-03: 10000 [IU] via SUBCUTANEOUS
  Filled 2019-03-03: qty 1

## 2019-03-17 ENCOUNTER — Other Ambulatory Visit: Payer: Self-pay

## 2019-03-17 ENCOUNTER — Ambulatory Visit (HOSPITAL_COMMUNITY)
Admission: RE | Admit: 2019-03-17 | Discharge: 2019-03-17 | Disposition: A | Discharge status: Home / Self Care | Payer: Medicare Other | Source: Ambulatory Visit | Attending: Nephrology | Admitting: Nephrology

## 2019-03-17 VITALS — BP 162/64 | HR 72 | Temp 97.7°F | Resp 20

## 2019-03-17 DIAGNOSIS — D631 Anemia in chronic kidney disease: Secondary | ICD-10-CM | POA: Insufficient documentation

## 2019-03-17 DIAGNOSIS — N179 Acute kidney failure, unspecified: Secondary | ICD-10-CM | POA: Insufficient documentation

## 2019-03-17 DIAGNOSIS — N189 Chronic kidney disease, unspecified: Secondary | ICD-10-CM | POA: Insufficient documentation

## 2019-03-17 LAB — POCT HEMOGLOBIN-HEMACUE: Hemoglobin: 11.3 g/dL — ABNORMAL LOW (ref 12.0–15.0)

## 2019-03-17 MED ORDER — EPOETIN ALFA-EPBX 10000 UNIT/ML IJ SOLN
10000.0000 [IU] | INTRAMUSCULAR | Status: DC
  Administered 2019-03-17: 10000 [IU] via SUBCUTANEOUS
  Filled 2019-03-17: qty 1

## 2019-03-28 ENCOUNTER — Other Ambulatory Visit: Payer: Self-pay | Admitting: Internal Medicine

## 2019-03-28 DIAGNOSIS — E1165 Type 2 diabetes mellitus with hyperglycemia: Secondary | ICD-10-CM

## 2019-03-28 DIAGNOSIS — Z794 Long term (current) use of insulin: Principal | ICD-10-CM

## 2019-03-31 ENCOUNTER — Other Ambulatory Visit: Payer: Self-pay

## 2019-03-31 ENCOUNTER — Ambulatory Visit (HOSPITAL_COMMUNITY)
Admission: RE | Admit: 2019-03-31 | Discharge: 2019-03-31 | Disposition: A | Discharge status: Home / Self Care | Payer: Medicare Other | Source: Ambulatory Visit | Attending: Nephrology | Admitting: Nephrology

## 2019-03-31 VITALS — BP 163/61 | HR 67 | Temp 97.2°F | Resp 20

## 2019-03-31 DIAGNOSIS — N179 Acute kidney failure, unspecified: Secondary | ICD-10-CM | POA: Insufficient documentation

## 2019-03-31 DIAGNOSIS — N189 Chronic kidney disease, unspecified: Secondary | ICD-10-CM | POA: Diagnosis not present

## 2019-03-31 DIAGNOSIS — D631 Anemia in chronic kidney disease: Secondary | ICD-10-CM | POA: Insufficient documentation

## 2019-03-31 LAB — RENAL FUNCTION PANEL
Albumin: 3.5 g/dL (ref 3.5–5.0)
Anion gap: 11 (ref 5–15)
BUN: 70 mg/dL — ABNORMAL HIGH (ref 8–23)
CO2: 21 mmol/L — ABNORMAL LOW (ref 22–32)
Calcium: 9.4 mg/dL (ref 8.9–10.3)
Chloride: 108 mmol/L (ref 98–111)
Creatinine, Ser: 2.77 mg/dL — ABNORMAL HIGH (ref 0.44–1.00)
GFR calc Af Amer: 19 mL/min — ABNORMAL LOW (ref >60)
GFR calc non Af Amer: 16 mL/min — ABNORMAL LOW (ref >60)
Glucose, Bld: 177 mg/dL — ABNORMAL HIGH (ref 70–99)
Phosphorus: 4.4 mg/dL (ref 2.5–4.6)
Potassium: 4.8 mmol/L (ref 3.5–5.1)
Sodium: 140 mmol/L (ref 135–145)

## 2019-03-31 LAB — DIFFERENTIAL
Abs Immature Granulocytes: 0.03 10*3/uL (ref 0.00–0.07)
Basophils Absolute: 0 10*3/uL (ref 0.0–0.1)
Basophils Relative: 0 %
Eosinophils Absolute: 0.3 10*3/uL (ref 0.0–0.5)
Eosinophils Relative: 3 %
Immature Granulocytes: 0 %
Lymphocytes Relative: 22 %
Lymphs Abs: 2.3 10*3/uL (ref 0.7–4.0)
Monocytes Absolute: 0.6 10*3/uL (ref 0.1–1.0)
Monocytes Relative: 6 %
Neutro Abs: 6.9 10*3/uL (ref 1.7–7.7)
Neutrophils Relative %: 69 %

## 2019-03-31 LAB — CBC
HCT: 34.3 % — ABNORMAL LOW (ref 36.0–46.0)
Hemoglobin: 10.9 g/dL — ABNORMAL LOW (ref 12.0–15.0)
MCH: 30.6 pg (ref 26.0–34.0)
MCHC: 31.8 g/dL (ref 30.0–36.0)
MCV: 96.3 fL (ref 80.0–100.0)
Platelets: 174 10*3/uL (ref 150–400)
RBC: 3.56 MIL/uL — ABNORMAL LOW (ref 3.87–5.11)
RDW: 12.5 % (ref 11.5–15.5)
WBC: 10.2 10*3/uL (ref 4.0–10.5)
nRBC: 0 % (ref 0.0–0.2)

## 2019-03-31 LAB — IRON AND TIBC
Iron: 85 ug/dL (ref 28–170)
Saturation Ratios: 34 % — ABNORMAL HIGH (ref 10.4–31.8)
TIBC: 252 ug/dL (ref 250–450)
UIBC: 167 ug/dL

## 2019-03-31 LAB — POCT HEMOGLOBIN-HEMACUE: Hemoglobin: 11.2 g/dL — ABNORMAL LOW (ref 12.0–15.0)

## 2019-03-31 LAB — FERRITIN: Ferritin: 694 ng/mL — ABNORMAL HIGH (ref 11–307)

## 2019-03-31 LAB — MAGNESIUM: Magnesium: 1.7 mg/dL (ref 1.7–2.4)

## 2019-03-31 MED ORDER — EPOETIN ALFA-EPBX 10000 UNIT/ML IJ SOLN
10000.0000 [IU] | INTRAMUSCULAR | Status: DC
  Administered 2019-03-31: 10000 [IU] via SUBCUTANEOUS
  Filled 2019-03-31: qty 1

## 2019-04-01 NOTE — Telephone Encounter (Signed)
LM for patient to call the office to schedule an appointment to follow up on her diabetes and blood pressure. Jazmin Hartsell,CMA

## 2019-04-01 NOTE — Telephone Encounter (Signed)
Please have her come in for office visit to check A1C, blood pressure

## 2019-04-14 ENCOUNTER — Encounter (HOSPITAL_COMMUNITY)
Admission: RE | Admit: 2019-04-14 | Discharge: 2019-04-14 | Disposition: A | Discharge status: Home / Self Care | Payer: Medicare Other | Source: Ambulatory Visit | Attending: Nephrology | Admitting: Nephrology

## 2019-04-14 ENCOUNTER — Other Ambulatory Visit: Payer: Self-pay

## 2019-04-14 VITALS — BP 149/56 | HR 67 | Temp 97.3°F | Resp 20

## 2019-04-14 DIAGNOSIS — D631 Anemia in chronic kidney disease: Secondary | ICD-10-CM | POA: Diagnosis not present

## 2019-04-14 DIAGNOSIS — N179 Acute kidney failure, unspecified: Secondary | ICD-10-CM | POA: Insufficient documentation

## 2019-04-14 DIAGNOSIS — N189 Chronic kidney disease, unspecified: Secondary | ICD-10-CM | POA: Insufficient documentation

## 2019-04-14 LAB — POCT HEMOGLOBIN-HEMACUE: Hemoglobin: 10.5 g/dL — ABNORMAL LOW (ref 12.0–15.0)

## 2019-04-14 MED ORDER — EPOETIN ALFA-EPBX 10000 UNIT/ML IJ SOLN
10000.0000 [IU] | INTRAMUSCULAR | Status: DC
  Administered 2019-04-14: 10000 [IU] via SUBCUTANEOUS
  Filled 2019-04-14: qty 1

## 2019-04-23 ENCOUNTER — Other Ambulatory Visit: Payer: Self-pay | Admitting: Family Medicine

## 2019-04-28 ENCOUNTER — Other Ambulatory Visit: Payer: Self-pay

## 2019-04-28 ENCOUNTER — Ambulatory Visit (HOSPITAL_COMMUNITY)
Admission: RE | Admit: 2019-04-28 | Discharge: 2019-04-28 | Disposition: A | Discharge status: Home / Self Care | Payer: Medicare Other | Source: Ambulatory Visit | Attending: Nephrology | Admitting: Nephrology

## 2019-04-28 VITALS — BP 162/61 | HR 67 | Temp 97.9°F | Resp 18

## 2019-04-28 DIAGNOSIS — N179 Acute kidney failure, unspecified: Secondary | ICD-10-CM | POA: Diagnosis not present

## 2019-04-28 DIAGNOSIS — D631 Anemia in chronic kidney disease: Secondary | ICD-10-CM | POA: Diagnosis not present

## 2019-04-28 DIAGNOSIS — N189 Chronic kidney disease, unspecified: Secondary | ICD-10-CM | POA: Insufficient documentation

## 2019-04-28 LAB — IRON AND TIBC
Iron: 70 ug/dL (ref 28–170)
Saturation Ratios: 27 % (ref 10.4–31.8)
TIBC: 258 ug/dL (ref 250–450)
UIBC: 188 ug/dL

## 2019-04-28 LAB — RENAL FUNCTION PANEL
Albumin: 3.5 g/dL (ref 3.5–5.0)
Anion gap: 11 (ref 5–15)
BUN: 68 mg/dL — ABNORMAL HIGH (ref 8–23)
CO2: 22 mmol/L (ref 22–32)
Calcium: 9.5 mg/dL (ref 8.9–10.3)
Chloride: 107 mmol/L (ref 98–111)
Creatinine, Ser: 2.96 mg/dL — ABNORMAL HIGH (ref 0.44–1.00)
GFR calc Af Amer: 17 mL/min — ABNORMAL LOW (ref >60)
GFR calc non Af Amer: 15 mL/min — ABNORMAL LOW (ref >60)
Glucose, Bld: 148 mg/dL — ABNORMAL HIGH (ref 70–99)
Phosphorus: 5.6 mg/dL — ABNORMAL HIGH (ref 2.5–4.6)
Potassium: 4.7 mmol/L (ref 3.5–5.1)
Sodium: 140 mmol/L (ref 135–145)

## 2019-04-28 LAB — DIFFERENTIAL
Abs Immature Granulocytes: 0.04 10*3/uL (ref 0.00–0.07)
Basophils Absolute: 0 10*3/uL (ref 0.0–0.1)
Basophils Relative: 0 %
Eosinophils Absolute: 0.4 10*3/uL (ref 0.0–0.5)
Eosinophils Relative: 3 %
Immature Granulocytes: 0 %
Lymphocytes Relative: 22 %
Lymphs Abs: 2.6 10*3/uL (ref 0.7–4.0)
Monocytes Absolute: 0.7 10*3/uL (ref 0.1–1.0)
Monocytes Relative: 6 %
Neutro Abs: 7.9 10*3/uL — ABNORMAL HIGH (ref 1.7–7.7)
Neutrophils Relative %: 69 %

## 2019-04-28 LAB — MAGNESIUM: Magnesium: 1.7 mg/dL (ref 1.7–2.4)

## 2019-04-28 LAB — CBC
HCT: 33.7 % — ABNORMAL LOW (ref 36.0–46.0)
Hemoglobin: 10.8 g/dL — ABNORMAL LOW (ref 12.0–15.0)
MCH: 31.2 pg (ref 26.0–34.0)
MCHC: 32 g/dL (ref 30.0–36.0)
MCV: 97.4 fL (ref 80.0–100.0)
Platelets: 181 10*3/uL (ref 150–400)
RBC: 3.46 MIL/uL — ABNORMAL LOW (ref 3.87–5.11)
RDW: 12.5 % (ref 11.5–15.5)
WBC: 11.6 10*3/uL — ABNORMAL HIGH (ref 4.0–10.5)
nRBC: 0 % (ref 0.0–0.2)

## 2019-04-28 LAB — POCT HEMOGLOBIN-HEMACUE: Hemoglobin: 10.6 g/dL — ABNORMAL LOW (ref 12.0–15.0)

## 2019-04-28 LAB — FERRITIN: Ferritin: 541 ng/mL — ABNORMAL HIGH (ref 11–307)

## 2019-04-28 MED ORDER — EPOETIN ALFA-EPBX 10000 UNIT/ML IJ SOLN
10000.0000 [IU] | INTRAMUSCULAR | Status: DC
  Administered 2019-04-28: 10000 [IU] via SUBCUTANEOUS
  Filled 2019-04-28: qty 1

## 2019-05-12 ENCOUNTER — Ambulatory Visit (HOSPITAL_COMMUNITY)
Admission: RE | Admit: 2019-05-12 | Discharge: 2019-05-12 | Disposition: A | Discharge status: Home / Self Care | Payer: Medicare Other | Source: Ambulatory Visit | Attending: Nephrology | Admitting: Nephrology

## 2019-05-12 ENCOUNTER — Other Ambulatory Visit: Payer: Self-pay

## 2019-05-12 VITALS — BP 127/50 | HR 70 | Temp 97.2°F | Resp 20

## 2019-05-12 DIAGNOSIS — N179 Acute kidney failure, unspecified: Secondary | ICD-10-CM | POA: Insufficient documentation

## 2019-05-12 DIAGNOSIS — N189 Chronic kidney disease, unspecified: Secondary | ICD-10-CM | POA: Diagnosis not present

## 2019-05-12 DIAGNOSIS — D631 Anemia in chronic kidney disease: Secondary | ICD-10-CM | POA: Diagnosis not present

## 2019-05-12 LAB — POCT HEMOGLOBIN-HEMACUE: Hemoglobin: 11 g/dL — ABNORMAL LOW (ref 12.0–15.0)

## 2019-05-12 MED ORDER — EPOETIN ALFA-EPBX 10000 UNIT/ML IJ SOLN
10000.0000 [IU] | INTRAMUSCULAR | Status: DC
  Administered 2019-05-12: 10000 [IU] via SUBCUTANEOUS
  Filled 2019-05-12: qty 1

## 2019-05-18 DIAGNOSIS — D631 Anemia in chronic kidney disease: Secondary | ICD-10-CM | POA: Diagnosis not present

## 2019-05-18 DIAGNOSIS — N2581 Secondary hyperparathyroidism of renal origin: Secondary | ICD-10-CM | POA: Diagnosis not present

## 2019-05-18 DIAGNOSIS — I129 Hypertensive chronic kidney disease with stage 1 through stage 4 chronic kidney disease, or unspecified chronic kidney disease: Secondary | ICD-10-CM | POA: Diagnosis not present

## 2019-05-18 DIAGNOSIS — N184 Chronic kidney disease, stage 4 (severe): Secondary | ICD-10-CM | POA: Diagnosis not present

## 2019-05-26 ENCOUNTER — Other Ambulatory Visit: Payer: Self-pay

## 2019-05-26 ENCOUNTER — Ambulatory Visit (HOSPITAL_COMMUNITY)
Admission: RE | Admit: 2019-05-26 | Discharge: 2019-05-26 | Disposition: A | Discharge status: Home / Self Care | Payer: Medicare Other | Source: Ambulatory Visit | Attending: Nephrology | Admitting: Nephrology

## 2019-05-26 VITALS — BP 130/51 | HR 70 | Temp 95.9°F | Resp 20

## 2019-05-26 DIAGNOSIS — D631 Anemia in chronic kidney disease: Secondary | ICD-10-CM | POA: Diagnosis not present

## 2019-05-26 DIAGNOSIS — N179 Acute kidney failure, unspecified: Secondary | ICD-10-CM | POA: Insufficient documentation

## 2019-05-26 DIAGNOSIS — N189 Chronic kidney disease, unspecified: Secondary | ICD-10-CM | POA: Diagnosis not present

## 2019-05-26 LAB — POCT HEMOGLOBIN-HEMACUE: Hemoglobin: 10.6 g/dL — ABNORMAL LOW (ref 12.0–15.0)

## 2019-05-26 MED ORDER — EPOETIN ALFA-EPBX 10000 UNIT/ML IJ SOLN
10000.0000 [IU] | INTRAMUSCULAR | Status: DC
  Administered 2019-05-26: 10000 [IU] via SUBCUTANEOUS
  Filled 2019-05-26: qty 1

## 2019-05-30 ENCOUNTER — Other Ambulatory Visit: Payer: Self-pay | Admitting: Internal Medicine

## 2019-05-30 DIAGNOSIS — E7800 Pure hypercholesterolemia, unspecified: Secondary | ICD-10-CM

## 2019-06-02 NOTE — Telephone Encounter (Signed)
mychart message sent to patient. Theadora Noyes,CMA  

## 2019-06-02 NOTE — Telephone Encounter (Signed)
Refill provided. Please have pt make appt for diabetes

## 2019-06-09 ENCOUNTER — Ambulatory Visit (HOSPITAL_COMMUNITY)
Admission: RE | Admit: 2019-06-09 | Discharge: 2019-06-09 | Disposition: A | Discharge status: Home / Self Care | Payer: Medicare Other | Source: Ambulatory Visit | Attending: Nephrology | Admitting: Nephrology

## 2019-06-09 ENCOUNTER — Other Ambulatory Visit: Payer: Self-pay

## 2019-06-09 VITALS — BP 151/58 | HR 67 | Temp 97.2°F | Resp 20

## 2019-06-09 DIAGNOSIS — N179 Acute kidney failure, unspecified: Secondary | ICD-10-CM | POA: Insufficient documentation

## 2019-06-09 DIAGNOSIS — D631 Anemia in chronic kidney disease: Secondary | ICD-10-CM | POA: Insufficient documentation

## 2019-06-09 DIAGNOSIS — N189 Chronic kidney disease, unspecified: Secondary | ICD-10-CM | POA: Insufficient documentation

## 2019-06-09 LAB — DIFFERENTIAL
Abs Immature Granulocytes: 0.03 10*3/uL (ref 0.00–0.07)
Basophils Absolute: 0 10*3/uL (ref 0.0–0.1)
Basophils Relative: 0 %
Eosinophils Absolute: 0.4 10*3/uL (ref 0.0–0.5)
Eosinophils Relative: 4 %
Immature Granulocytes: 0 %
Lymphocytes Relative: 23 %
Lymphs Abs: 2.4 10*3/uL (ref 0.7–4.0)
Monocytes Absolute: 0.7 10*3/uL (ref 0.1–1.0)
Monocytes Relative: 7 %
Neutro Abs: 7.2 10*3/uL (ref 1.7–7.7)
Neutrophils Relative %: 66 %

## 2019-06-09 LAB — CBC
HCT: 33.5 % — ABNORMAL LOW (ref 36.0–46.0)
Hemoglobin: 10.4 g/dL — ABNORMAL LOW (ref 12.0–15.0)
MCH: 30.2 pg (ref 26.0–34.0)
MCHC: 31 g/dL (ref 30.0–36.0)
MCV: 97.4 fL (ref 80.0–100.0)
Platelets: 154 10*3/uL (ref 150–400)
RBC: 3.44 MIL/uL — ABNORMAL LOW (ref 3.87–5.11)
RDW: 12.7 % (ref 11.5–15.5)
WBC: 10.7 10*3/uL — ABNORMAL HIGH (ref 4.0–10.5)
nRBC: 0 % (ref 0.0–0.2)

## 2019-06-09 LAB — RENAL FUNCTION PANEL
Albumin: 3.5 g/dL (ref 3.5–5.0)
Anion gap: 10 (ref 5–15)
BUN: 68 mg/dL — ABNORMAL HIGH (ref 8–23)
CO2: 18 mmol/L — ABNORMAL LOW (ref 22–32)
Calcium: 9.3 mg/dL (ref 8.9–10.3)
Chloride: 111 mmol/L (ref 98–111)
Creatinine, Ser: 2.87 mg/dL — ABNORMAL HIGH (ref 0.44–1.00)
GFR calc Af Amer: 18 mL/min — ABNORMAL LOW (ref >60)
GFR calc non Af Amer: 15 mL/min — ABNORMAL LOW (ref >60)
Glucose, Bld: 182 mg/dL — ABNORMAL HIGH (ref 70–99)
Phosphorus: 4.3 mg/dL (ref 2.5–4.6)
Potassium: 4.9 mmol/L (ref 3.5–5.1)
Sodium: 139 mmol/L (ref 135–145)

## 2019-06-09 LAB — IRON AND TIBC
Iron: 77 ug/dL (ref 28–170)
Saturation Ratios: 31 % (ref 10.4–31.8)
TIBC: 252 ug/dL (ref 250–450)
UIBC: 175 ug/dL

## 2019-06-09 LAB — POCT HEMOGLOBIN-HEMACUE: Hemoglobin: 10.4 g/dL — ABNORMAL LOW (ref 12.0–15.0)

## 2019-06-09 LAB — MAGNESIUM: Magnesium: 1.7 mg/dL (ref 1.7–2.4)

## 2019-06-09 LAB — FERRITIN: Ferritin: 592 ng/mL — ABNORMAL HIGH (ref 11–307)

## 2019-06-09 MED ORDER — EPOETIN ALFA-EPBX 10000 UNIT/ML IJ SOLN
10000.0000 [IU] | INTRAMUSCULAR | Status: DC
  Administered 2019-06-09: 10000 [IU] via SUBCUTANEOUS
  Filled 2019-06-09: qty 1

## 2019-06-23 ENCOUNTER — Other Ambulatory Visit: Payer: Self-pay

## 2019-06-23 ENCOUNTER — Ambulatory Visit (HOSPITAL_COMMUNITY)
Admission: RE | Admit: 2019-06-23 | Discharge: 2019-06-23 | Disposition: A | Discharge status: Home / Self Care | Payer: Medicare Other | Source: Ambulatory Visit | Attending: Nephrology | Admitting: Nephrology

## 2019-06-23 VITALS — HR 69 | Temp 97.1°F | Resp 20

## 2019-06-23 DIAGNOSIS — N189 Chronic kidney disease, unspecified: Secondary | ICD-10-CM | POA: Insufficient documentation

## 2019-06-23 DIAGNOSIS — D631 Anemia in chronic kidney disease: Secondary | ICD-10-CM | POA: Diagnosis not present

## 2019-06-23 DIAGNOSIS — N179 Acute kidney failure, unspecified: Secondary | ICD-10-CM | POA: Diagnosis not present

## 2019-06-23 LAB — POCT HEMOGLOBIN-HEMACUE: Hemoglobin: 10.6 g/dL — ABNORMAL LOW (ref 12.0–15.0)

## 2019-06-23 MED ORDER — EPOETIN ALFA-EPBX 10000 UNIT/ML IJ SOLN
10000.0000 [IU] | INTRAMUSCULAR | Status: DC
  Administered 2019-06-23: 10000 [IU] via SUBCUTANEOUS
  Filled 2019-06-23: qty 1

## 2019-07-07 ENCOUNTER — Other Ambulatory Visit: Payer: Self-pay

## 2019-07-07 ENCOUNTER — Ambulatory Visit (HOSPITAL_COMMUNITY)
Admission: RE | Admit: 2019-07-07 | Discharge: 2019-07-07 | Disposition: A | Discharge status: Home / Self Care | Payer: Medicare Other | Source: Ambulatory Visit | Attending: Nephrology | Admitting: Nephrology

## 2019-07-07 VITALS — BP 149/53 | HR 70 | Temp 97.0°F | Resp 20

## 2019-07-07 DIAGNOSIS — D631 Anemia in chronic kidney disease: Secondary | ICD-10-CM | POA: Diagnosis not present

## 2019-07-07 DIAGNOSIS — N189 Chronic kidney disease, unspecified: Secondary | ICD-10-CM | POA: Diagnosis not present

## 2019-07-07 DIAGNOSIS — N179 Acute kidney failure, unspecified: Secondary | ICD-10-CM | POA: Insufficient documentation

## 2019-07-07 LAB — DIFFERENTIAL
Abs Immature Granulocytes: 0.03 10*3/uL (ref 0.00–0.07)
Basophils Absolute: 0 10*3/uL (ref 0.0–0.1)
Basophils Relative: 0 %
Eosinophils Absolute: 0.3 10*3/uL (ref 0.0–0.5)
Eosinophils Relative: 3 %
Immature Granulocytes: 0 %
Lymphocytes Relative: 24 %
Lymphs Abs: 2.3 10*3/uL (ref 0.7–4.0)
Monocytes Absolute: 0.5 10*3/uL (ref 0.1–1.0)
Monocytes Relative: 6 %
Neutro Abs: 6.4 10*3/uL (ref 1.7–7.7)
Neutrophils Relative %: 67 %

## 2019-07-07 LAB — CBC
HCT: 34.1 % — ABNORMAL LOW (ref 36.0–46.0)
Hemoglobin: 10.9 g/dL — ABNORMAL LOW (ref 12.0–15.0)
MCH: 31.2 pg (ref 26.0–34.0)
MCHC: 32 g/dL (ref 30.0–36.0)
MCV: 97.7 fL (ref 80.0–100.0)
Platelets: 173 10*3/uL (ref 150–400)
RBC: 3.49 MIL/uL — ABNORMAL LOW (ref 3.87–5.11)
RDW: 12.4 % (ref 11.5–15.5)
WBC: 9.6 10*3/uL (ref 4.0–10.5)
nRBC: 0 % (ref 0.0–0.2)

## 2019-07-07 LAB — RENAL FUNCTION PANEL
Albumin: 3.6 g/dL (ref 3.5–5.0)
Anion gap: 9 (ref 5–15)
BUN: 73 mg/dL — ABNORMAL HIGH (ref 8–23)
CO2: 21 mmol/L — ABNORMAL LOW (ref 22–32)
Calcium: 9.3 mg/dL (ref 8.9–10.3)
Chloride: 109 mmol/L (ref 98–111)
Creatinine, Ser: 3.07 mg/dL — ABNORMAL HIGH (ref 0.44–1.00)
GFR calc Af Amer: 16 mL/min — ABNORMAL LOW (ref >60)
GFR calc non Af Amer: 14 mL/min — ABNORMAL LOW (ref >60)
Glucose, Bld: 234 mg/dL — ABNORMAL HIGH (ref 70–99)
Phosphorus: 5.3 mg/dL — ABNORMAL HIGH (ref 2.5–4.6)
Potassium: 5.4 mmol/L — ABNORMAL HIGH (ref 3.5–5.1)
Sodium: 139 mmol/L (ref 135–145)

## 2019-07-07 LAB — FERRITIN: Ferritin: 681 ng/mL — ABNORMAL HIGH (ref 11–307)

## 2019-07-07 LAB — MAGNESIUM: Magnesium: 1.8 mg/dL (ref 1.7–2.4)

## 2019-07-07 LAB — IRON AND TIBC
Iron: 91 ug/dL (ref 28–170)
Saturation Ratios: 34 % — ABNORMAL HIGH (ref 10.4–31.8)
TIBC: 272 ug/dL (ref 250–450)
UIBC: 181 ug/dL

## 2019-07-07 LAB — POCT HEMOGLOBIN-HEMACUE: Hemoglobin: 10.7 g/dL — ABNORMAL LOW (ref 12.0–15.0)

## 2019-07-07 MED ORDER — EPOETIN ALFA-EPBX 10000 UNIT/ML IJ SOLN
10000.0000 [IU] | INTRAMUSCULAR | Status: DC
  Administered 2019-07-07: 13:00:00 10000 [IU] via SUBCUTANEOUS
  Filled 2019-07-07: qty 1

## 2019-07-13 ENCOUNTER — Encounter: Payer: Self-pay | Admitting: Physician Assistant

## 2019-07-13 ENCOUNTER — Telehealth: Payer: Medicare Other | Admitting: Physician Assistant

## 2019-07-13 DIAGNOSIS — R42 Dizziness and giddiness: Secondary | ICD-10-CM

## 2019-07-13 DIAGNOSIS — G4489 Other headache syndrome: Secondary | ICD-10-CM

## 2019-07-13 DIAGNOSIS — R200 Anesthesia of skin: Secondary | ICD-10-CM

## 2019-07-13 NOTE — Progress Notes (Signed)
Based on what you shared with me, I feel your condition warrants further evaluation and I recommend that you be seen for a face to face office visit.  NOTE: If you entered your credit card information for this eVisit, you will not be charged. You may see a "hold" on your card for the $35 but that hold will drop off and you will not have a charge processed.  Ms. Lorenzetti,  Your symptoms of dizziness, headache, and numbness warrant a face to face evaluation. Please call your doctor's office or go to an urgent care or the ER for further workup  If you are having a true medical emergency please call 911.     For an urgent face to face visit, [place] has five urgent care centers for your convenience:   [email or web address] to reserve your spot online an avoid wait times  [place], [place], [place] [address] *Just off [address], across the road from [place] hours of operation: Monday-Friday, 12 PM to 6 PM  Closed Saturday & Sunday    . [facility]    [phone]                  Get Driving Directions  [id number] [place], [place] [address] . 10 am to 8 pm Monday-Friday . 12 pm to 8 pm Saturday-Sunday   . [facility] at [place]                  Get Driving Directions  [id number] [place], [place], [place] [address] . 8 am to 8 pm Monday-Friday . 9 am to 6 pm Saturday . 11 am to 6 pm Sunday   . [facility] at [place]                  Get Driving Directions   [address] [place], [place] [address] . 8 am to 8 pm Monday-Friday . 8 am to 4 pm Saturday-Sunday    . [facility] at [place]                    Get Driving Directions  [id number]  [address], [place], [place] [address]  . Monday-Friday, 12 PM to 6 PM    Your e-visit answers were  reviewed by a board certified advanced clinical practitioner to complete your personal care plan.  Thank you for using e-Visits. I spent 5-10 minutes on review and completion of this note- Lacy Duverney [facility]

## 2019-07-14 ENCOUNTER — Other Ambulatory Visit: Payer: Self-pay

## 2019-07-14 ENCOUNTER — Ambulatory Visit (INDEPENDENT_AMBULATORY_CARE_PROVIDER_SITE_OTHER): Payer: Medicare Other | Admitting: Family Medicine

## 2019-07-14 VITALS — BP 152/60 | HR 71 | Wt 196.6 lb

## 2019-07-14 DIAGNOSIS — R0601 Orthopnea: Secondary | ICD-10-CM | POA: Diagnosis not present

## 2019-07-14 DIAGNOSIS — R42 Dizziness and giddiness: Secondary | ICD-10-CM

## 2019-07-14 DIAGNOSIS — R195 Other fecal abnormalities: Secondary | ICD-10-CM | POA: Diagnosis not present

## 2019-07-14 NOTE — Patient Instructions (Signed)
It was a pleasure to see you today! Thank you for choosing [facility] for your primary care. Kimberly Carlson was seen for dizziness and tingling in hands. Come back to the clinic to see Dr. Ky Barban in 2 weeks.   Today we talked about the occasional tingling in your left hand.  We were unable to find any focal neurological problems, this is likely minor nerve irritation as it is been stable for so long.  Based off your story and physical exam, I think it is most likely that your dizziness is related to a vestibular dysfunction(the inner workings of your ear) and were sending you to a vestibular physical therapist.  If you do not hear from them within the next week, please call our office and get their phone numbers that we can try and arrange an appointment.  Based off your description of having trouble breathing and you lay flat and swelling in your legs, we are ordering another echo which is a picture of your heart.  We did have a conversation about your decision to refuse colonoscopy.  It is your right to do so, but I want to repeat my medical advice that you consider getting this done.   Please bring all your medications to every doctors visit   Sign up for My Chart to have easy access to your labs results, and communication with your Primary care physician.     Please check-out at the front desk before leaving the clinic.     Best,  Dr. Sherene Sires FAMILY MEDICINE RESIDENT - PGY3 07/14/2019 10:29 AM

## 2019-07-14 NOTE — Assessment & Plan Note (Signed)
Had another very direct conversation with the patient.  She is still choosing to decline colonoscopy.  She was specifically asked if she would rather have colon cancer and not be aware that do the colonoscopy and she said yes firmly.  She says that she is scared to get a colonoscopy and does not want to do it.

## 2019-07-14 NOTE — Progress Notes (Signed)
    Subjective:  Kimberly Carlson is a 73 y.o. female who presents to the [facility] today with a chief complaint of dizziness.   HPI: Vertigo Patient complains of vertigo symptoms for the last week or 2.  She says that this happened since she had a feeling of "water in her ear "she has had no focal neurological deficit or changes, no visual changes, no nausea vomiting, no injuries or falls.  She is not on anticoagulation  Positive colorectal cancer screening using Cologuard test Had another very direct conversation with the patient.  She is still choosing to decline colonoscopy.  She was specifically asked if she would rather have colon cancer and not be aware that do the colonoscopy and she said yes firmly.  She says that she is scared to get a colonoscopy and does not want to do it.  Orthopnea Patient complaining of minor orthopnea, saying she started using more pillows to sleep whenever she goes to bed.  Does not have any pitting edema on her leg exam but says every now that her feet get swollen by the end of the day., last echo was a year and a half ago with 60 to 65% ejection fraction.  Objective:  Physical Exam: BP (!) 152/60   Pulse 71   Wt 196 lb 9.6 oz (89.2 kg)   LMP  (LMP Unknown)   SpO2 98%   BMI 34.83 kg/m   Gen: NAD, conversing comfortably CV: RRR with no murmurs appreciated Pulm: NWOB, CTAB with no crackles, wheezes, or rhonchi GI: Normal bowel sounds present. Soft, Nontender, Nondistended. MSK: no edema, cyanosis, or clubbing noted Skin: warm, dry Neuro: grossly normal, moves all extremities Psych: Normal affect and thought content  No results found for this or any previous visit (from the past 72 hour(s)).   Assessment/Plan:  Positive colorectal cancer screening using Cologuard test Had another very direct conversation with the patient.  She is still choosing to decline colonoscopy.  She was specifically asked if she would rather have colon cancer and not be aware  that do the colonoscopy and she said yes firmly.  She says that she is scared to get a colonoscopy and does not want to do it.  Vertigo Patient complains of vertigo symptoms for the last week or 2.  She says that this happened since she had a feeling of "water in her ear "she has had no focal neurological deficit or changes, no visual changes, no nausea vomiting, no injuries or falls.  She is not on anticoagulation, physical exam was unable to elicit nystagmus but with almost every positional change she did have a subjective dizziness sensation.  Orthostatic hypotension was negative  Referred to vestibular rehab, return precautions to the ED given.  Orthopnea Patient complaining of minor orthopnea, saying she started using more pillows to sleep whenever she goes to bed.  Does not have any pitting edema on her leg exam, last echo was a year and a half ago with 60 to 65% ejection fraction.  Repeat echo  Sherene Sires, [place] - PGY3 07/16/2019 7:41 AM

## 2019-07-16 ENCOUNTER — Ambulatory Visit (HOSPITAL_COMMUNITY)
Admission: RE | Admit: 2019-07-16 | Discharge: 2019-07-16 | Disposition: A | Discharge status: Home / Self Care | Payer: Medicare Other | Source: Ambulatory Visit | Attending: Family Medicine | Admitting: Family Medicine

## 2019-07-16 ENCOUNTER — Other Ambulatory Visit: Payer: Self-pay

## 2019-07-16 DIAGNOSIS — I080 Rheumatic disorders of both mitral and aortic valves: Secondary | ICD-10-CM | POA: Diagnosis not present

## 2019-07-16 DIAGNOSIS — R0601 Orthopnea: Secondary | ICD-10-CM

## 2019-07-16 DIAGNOSIS — E119 Type 2 diabetes mellitus without complications: Secondary | ICD-10-CM | POA: Diagnosis not present

## 2019-07-16 DIAGNOSIS — I119 Hypertensive heart disease without heart failure: Secondary | ICD-10-CM | POA: Diagnosis not present

## 2019-07-16 DIAGNOSIS — R42 Dizziness and giddiness: Secondary | ICD-10-CM

## 2019-07-16 HISTORY — DX: Dizziness and giddiness: R42

## 2019-07-16 NOTE — Progress Notes (Signed)
  Echocardiogram 2D Echocardiogram has been performed.  Kimberly Carlson 07/16/2019, 4:27 PM

## 2019-07-16 NOTE — Assessment & Plan Note (Signed)
Patient complains of vertigo symptoms for the last week or 2.  She says that this happened since she had a feeling of "water in her ear "she has had no focal neurological deficit or changes, no visual changes, no nausea vomiting, no injuries or falls.  She is not on anticoagulation, physical exam was unable to elicit nystagmus but with almost every positional change she did have a subjective dizziness sensation.  Orthostatic hypotension was negative  Referred to vestibular rehab, return precautions to the ED given.

## 2019-07-16 NOTE — Assessment & Plan Note (Signed)
Patient complaining of minor orthopnea, saying she started using more pillows to sleep whenever she goes to bed.  Does not have any pitting edema on her leg exam, last echo was a year and a half ago with 60 to 65% ejection fraction.  Repeat echo

## 2019-07-21 ENCOUNTER — Other Ambulatory Visit: Payer: Self-pay

## 2019-07-21 ENCOUNTER — Ambulatory Visit (HOSPITAL_COMMUNITY)
Admission: RE | Admit: 2019-07-21 | Discharge: 2019-07-21 | Disposition: A | Discharge status: Home / Self Care | Payer: Medicare Other | Source: Ambulatory Visit | Attending: Nephrology | Admitting: Nephrology

## 2019-07-21 VITALS — BP 142/58 | HR 72 | Temp 97.7°F | Resp 20

## 2019-07-21 DIAGNOSIS — D631 Anemia in chronic kidney disease: Secondary | ICD-10-CM | POA: Insufficient documentation

## 2019-07-21 DIAGNOSIS — N189 Chronic kidney disease, unspecified: Secondary | ICD-10-CM | POA: Insufficient documentation

## 2019-07-21 DIAGNOSIS — N179 Acute kidney failure, unspecified: Secondary | ICD-10-CM | POA: Diagnosis not present

## 2019-07-21 LAB — POCT HEMOGLOBIN-HEMACUE: Hemoglobin: 10.3 g/dL — ABNORMAL LOW (ref 12.0–15.0)

## 2019-07-21 MED ORDER — EPOETIN ALFA-EPBX 10000 UNIT/ML IJ SOLN
10000.0000 [IU] | INTRAMUSCULAR | Status: DC
  Administered 2019-07-21: 13:00:00 10000 [IU] via SUBCUTANEOUS
  Filled 2019-07-21: qty 1

## 2019-07-24 ENCOUNTER — Ambulatory Visit: Payer: Medicare Other | Admitting: Family Medicine

## 2019-07-30 ENCOUNTER — Other Ambulatory Visit: Payer: Self-pay

## 2019-07-30 ENCOUNTER — Ambulatory Visit (INDEPENDENT_AMBULATORY_CARE_PROVIDER_SITE_OTHER): Payer: Medicare Other | Admitting: Family Medicine

## 2019-07-30 ENCOUNTER — Encounter: Payer: Self-pay | Admitting: Family Medicine

## 2019-07-30 VITALS — BP 170/66 | HR 79 | Ht 63.0 in | Wt 195.2 lb

## 2019-07-30 DIAGNOSIS — N183 Chronic kidney disease, stage 3 unspecified: Secondary | ICD-10-CM

## 2019-07-30 DIAGNOSIS — I10 Essential (primary) hypertension: Secondary | ICD-10-CM

## 2019-07-30 DIAGNOSIS — E7800 Pure hypercholesterolemia, unspecified: Secondary | ICD-10-CM

## 2019-07-30 DIAGNOSIS — E1122 Type 2 diabetes mellitus with diabetic chronic kidney disease: Secondary | ICD-10-CM | POA: Diagnosis not present

## 2019-07-30 DIAGNOSIS — Z6834 Body mass index (BMI) 34.0-34.9, adult: Secondary | ICD-10-CM | POA: Diagnosis not present

## 2019-07-30 DIAGNOSIS — E1165 Type 2 diabetes mellitus with hyperglycemia: Secondary | ICD-10-CM

## 2019-07-30 DIAGNOSIS — Z794 Long term (current) use of insulin: Secondary | ICD-10-CM | POA: Diagnosis not present

## 2019-07-30 DIAGNOSIS — R195 Other fecal abnormalities: Secondary | ICD-10-CM

## 2019-07-30 DIAGNOSIS — E6609 Other obesity due to excess calories: Secondary | ICD-10-CM

## 2019-07-30 LAB — POCT GLYCOSYLATED HEMOGLOBIN (HGB A1C): HbA1c, POC (controlled diabetic range): 6.1 % (ref 0.0–7.0)

## 2019-07-30 MED ORDER — HYDRALAZINE HCL 50 MG PO TABS: 75.0000 mg | ORAL_TABLET | Freq: Three times a day (TID) | ORAL | 2 refills | Status: DC

## 2019-07-30 MED ORDER — INSULIN GLARGINE 100 UNIT/ML ~~LOC~~ SOLN: SUBCUTANEOUS | 0 refills | Status: DC

## 2019-07-30 NOTE — Assessment & Plan Note (Signed)
At goal on reduced insulin dose, A1c 6.1 today.  Will update chart to reflect this.  Foot exam done today, within normal limits.  Placed referral for ophthalmology.  Encouraged walking after biggest meal of the day and increasing exercise.  Follow-up in 6 months for repeat A1c.

## 2019-07-30 NOTE — Assessment & Plan Note (Addendum)
Largely managed by nephrology, but is not compliant with 100 mg hydralazine TID as she had adverse symptoms, only took this twice.  She is currently not at goal, 170/66 today.  Will increase her hydralazine to 75 mg TID until she is able to be seen by nephrology, has an appointment in 1 month.  Obtained BMP today.  Discussed risks of persistently elevated blood pressure including increased risk for heart attack, stroke, blindness.  Patient verbalized understanding.

## 2019-07-30 NOTE — Assessment & Plan Note (Signed)
Contributing to diabetes and hypertension.  Recommended weight loss and reviewed lifestyle modifications including diet and exercise today.

## 2019-07-30 NOTE — Patient Instructions (Addendum)
It was great to see you!  Our plans for today:   -Continue to take 28 units of Lantus daily.  We will recheck your A1c in 6 months. -We are changing the dose of your hydralazine to 75 mg 3 times daily.  Let us know if he have issues with this.  Keep your appointment with your kidney doctor in October. -It is strongly encouraged for you to get a colonoscopy as your Cologuard testing was positive.  This is fairly concerning for possible colon cancer. -We are obtaining labs today, we will call you or send you a letter with these results.  Take care and seek immediate care sooner if you develop any concerns.   Dr. [facility]

## 2019-07-30 NOTE — Assessment & Plan Note (Signed)
Again counseled on importance of obtaining colonoscopy, however patient refuses.  Able to express understanding of risk of not obtaining colonoscopy including death from colon cancer.

## 2019-07-30 NOTE — Progress Notes (Signed)
Subjective:   Patient ID: Kimberly Carlson    DOB: [birth year], 73 y.o. female   MRN: [id number]  Kimberly Carlson is a 73 y.o. female with a history of resistant HTN, type II DM, CKD, gout, anemia, cocaine use, HLD, obesity, h/o tobacco use, low back pain here for   Diabetes, Type 2 - Last A1c 6.8 10/2018 - Medications: Lantus 40 units daily - Compliance: taking 28u. Kept getting dizzy on the 40u. - Checking BG at home: sometimes, last checked last month - Diet: eats 3 meals per day. Fruits and vegetables. - Exercise: none - Eye exam: Due - Foot exam: Due - Microalbumin: UTD, has microalbuminuria, follows w/ [facility] - Statin: yes - Denies symptoms of hypoglycemia, polydipsia, numbness extremities, foot ulcers/trauma - tingling of hands bilaterally. Once in a while with feet.  Hypertension: - Medications: Coreg 25 mg twice daily, Lasix 40 mg daily, hydralazine 100 mg 3 times daily - Compliance: takes hydral 50mg  TID, otherwise compliant - Checking BP at home: no - Denies any CP, vision changes, medication SEs, or symptoms of hypotension - Diet: see above - Exercise: see above - has blurry vision 2/2 cataracts but this is been longstanding, has been a lot since she saw her eye doctor - some LE edema at night, resolves with elevation  Review of Systems:  Per HPI.  [place], medications and smoking status reviewed.  Objective:   BP (!) 170/66   Pulse 79   Ht 5\' 3"  (1.6 m)   Wt 195 lb 4 oz (88.6 kg)   LMP  (LMP Unknown)   SpO2 97%   BMI 34.59 kg/m  Vitals and nursing note reviewed.  General: Overweight female, in no acute distress with non-toxic appearance CV: regular rate and rhythm without murmurs, rubs, or gallops, no lower extremity edema Lungs: clear to auscultation bilaterally with normal work of breathing Skin: warm, dry, no rashes or lesions Extremities: warm and well perfused, normal tone.  Foot exam performed within normal limits. MSK: ROM grossly intact, gait  normal Neuro: Alert and oriented, speech normal  Assessment & Plan:   Resistant hypertension Largely managed by nephrology, but is not compliant with 100 mg hydralazine TID as she had adverse symptoms, only took this twice.  She is currently not at goal, 170/66 today.  Will increase her hydralazine to 75 mg TID until she is able to be seen by nephrology, has an appointment in 1 month.  Obtained BMP today.  Discussed risks of persistently elevated blood pressure including increased risk for heart attack, stroke, blindness.  Patient verbalized understanding.  DM2 (diabetes mellitus, type 2) ([place]) At goal on reduced insulin dose, A1c 6.1 today.  Will update chart to reflect this.  Foot exam done today, within normal limits.  Placed referral for ophthalmology.  Encouraged walking after biggest meal of the day and increasing exercise.  Follow-up in 6 months for repeat A1c.  HYPERCHOLESTEROLEMIA Tolerant of statin, no changes made today.  Obesity Contributing to diabetes and hypertension.  Recommended weight loss and reviewed lifestyle modifications including diet and exercise today.  Positive colorectal cancer screening using Cologuard test Again counseled on importance of obtaining colonoscopy, however patient refuses.  Able to express understanding of risk of not obtaining colonoscopy including death from colon cancer.  Orders Placed This Encounter  Procedures  . Basic Metabolic Panel  . Ambulatory referral to Ophthalmology    Referral Priority:   Routine    Referral Type:   Consultation    Referral  Reason:   Specialty Services Required    Requested Specialty:   Ophthalmology    Number of Visits Requested:   1  . HgB A1c   Meds ordered this encounter  Medications  . insulin glargine (LANTUS) 100 UNIT/ML injection    Sig: INJECT 28 UNITS UNDER THE SKIN EVERY DAY    Dispense:  50 mL    Refill:  0  . hydrALAZINE (APRESOLINE) 50 MG tablet    Sig: Take 1.5 tablets (75 mg total) by mouth  3 (three) times daily.    Dispense:  270 tablet    Refill:  [place], DO PGY-3, [facility] 07/30/2019 3:22 PM

## 2019-07-30 NOTE — Assessment & Plan Note (Signed)
Tolerant of statin, no changes made today.

## 2019-07-31 LAB — BASIC METABOLIC PANEL
BUN/Creatinine Ratio: 25 (ref 12–28)
BUN: 73 mg/dL — ABNORMAL HIGH (ref 8–27)
CO2: 17 mmol/L — ABNORMAL LOW (ref 20–29)
Calcium: 9.5 mg/dL (ref 8.7–10.3)
Chloride: 104 mmol/L (ref 96–106)
Creatinine, Ser: 2.87 mg/dL — ABNORMAL HIGH (ref 0.57–1.00)
GFR calc Af Amer: 18 mL/min/{1.73_m2} — ABNORMAL LOW (ref >59)
GFR calc non Af Amer: 15 mL/min/{1.73_m2} — ABNORMAL LOW (ref >59)
Glucose: 185 mg/dL — ABNORMAL HIGH (ref 65–99)
Potassium: 5.2 mmol/L (ref 3.5–5.2)
Sodium: 140 mmol/L (ref 134–144)

## 2019-08-04 ENCOUNTER — Other Ambulatory Visit: Payer: Self-pay

## 2019-08-04 ENCOUNTER — Ambulatory Visit (HOSPITAL_COMMUNITY)
Admission: RE | Admit: 2019-08-04 | Discharge: 2019-08-04 | Disposition: A | Discharge status: Home / Self Care | Payer: Medicare Other | Source: Ambulatory Visit | Attending: Nephrology | Admitting: Nephrology

## 2019-08-04 VITALS — BP 146/92 | HR 66

## 2019-08-04 DIAGNOSIS — D631 Anemia in chronic kidney disease: Secondary | ICD-10-CM | POA: Insufficient documentation

## 2019-08-04 DIAGNOSIS — N189 Chronic kidney disease, unspecified: Secondary | ICD-10-CM | POA: Diagnosis not present

## 2019-08-04 DIAGNOSIS — N179 Acute kidney failure, unspecified: Secondary | ICD-10-CM | POA: Insufficient documentation

## 2019-08-04 LAB — DIFFERENTIAL
Abs Immature Granulocytes: 0.03 10*3/uL (ref 0.00–0.07)
Basophils Absolute: 0.1 10*3/uL (ref 0.0–0.1)
Basophils Relative: 1 %
Eosinophils Absolute: 0.3 10*3/uL (ref 0.0–0.5)
Eosinophils Relative: 3 %
Immature Granulocytes: 0 %
Lymphocytes Relative: 20 %
Lymphs Abs: 1.9 10*3/uL (ref 0.7–4.0)
Monocytes Absolute: 0.5 10*3/uL (ref 0.1–1.0)
Monocytes Relative: 5 %
Neutro Abs: 6.8 10*3/uL (ref 1.7–7.7)
Neutrophils Relative %: 71 %

## 2019-08-04 LAB — RENAL FUNCTION PANEL
Albumin: 3.5 g/dL (ref 3.5–5.0)
Anion gap: 11 (ref 5–15)
BUN: 85 mg/dL — ABNORMAL HIGH (ref 8–23)
CO2: 19 mmol/L — ABNORMAL LOW (ref 22–32)
Calcium: 9.2 mg/dL (ref 8.9–10.3)
Chloride: 108 mmol/L (ref 98–111)
Creatinine, Ser: 3.05 mg/dL — ABNORMAL HIGH (ref 0.44–1.00)
GFR calc Af Amer: 17 mL/min — ABNORMAL LOW (ref >60)
GFR calc non Af Amer: 14 mL/min — ABNORMAL LOW (ref >60)
Glucose, Bld: 231 mg/dL — ABNORMAL HIGH (ref 70–99)
Phosphorus: 4.7 mg/dL — ABNORMAL HIGH (ref 2.5–4.6)
Potassium: 4.6 mmol/L (ref 3.5–5.1)
Sodium: 138 mmol/L (ref 135–145)

## 2019-08-04 LAB — CBC
HCT: 33.8 % — ABNORMAL LOW (ref 36.0–46.0)
Hemoglobin: 10.6 g/dL — ABNORMAL LOW (ref 12.0–15.0)
MCH: 30.7 pg (ref 26.0–34.0)
MCHC: 31.4 g/dL (ref 30.0–36.0)
MCV: 98 fL (ref 80.0–100.0)
Platelets: 157 10*3/uL (ref 150–400)
RBC: 3.45 MIL/uL — ABNORMAL LOW (ref 3.87–5.11)
RDW: 12.5 % (ref 11.5–15.5)
WBC: 9.7 10*3/uL (ref 4.0–10.5)
nRBC: 0 % (ref 0.0–0.2)

## 2019-08-04 LAB — IRON AND TIBC
Iron: 90 ug/dL (ref 28–170)
Saturation Ratios: 36 % — ABNORMAL HIGH (ref 10.4–31.8)
TIBC: 248 ug/dL — ABNORMAL LOW (ref 250–450)
UIBC: 158 ug/dL

## 2019-08-04 LAB — FERRITIN: Ferritin: 669 ng/mL — ABNORMAL HIGH (ref 11–307)

## 2019-08-04 LAB — POCT HEMOGLOBIN-HEMACUE: Hemoglobin: 10.2 g/dL — ABNORMAL LOW (ref 12.0–15.0)

## 2019-08-04 LAB — MAGNESIUM: Magnesium: 1.8 mg/dL (ref 1.7–2.4)

## 2019-08-04 MED ORDER — EPOETIN ALFA-EPBX 10000 UNIT/ML IJ SOLN
10000.0000 [IU] | INTRAMUSCULAR | Status: DC
  Administered 2019-08-04: 10000 [IU] via SUBCUTANEOUS
  Filled 2019-08-04: qty 1

## 2019-08-09 ENCOUNTER — Encounter: Payer: Self-pay | Admitting: Family Medicine

## 2019-08-10 ENCOUNTER — Other Ambulatory Visit: Payer: Self-pay | Admitting: Family Medicine

## 2019-08-10 DIAGNOSIS — Z794 Long term (current) use of insulin: Secondary | ICD-10-CM

## 2019-08-10 DIAGNOSIS — E1165 Type 2 diabetes mellitus with hyperglycemia: Secondary | ICD-10-CM

## 2019-08-10 MED ORDER — INSULIN GLARGINE 100 UNIT/ML ~~LOC~~ SOLN: SUBCUTANEOUS | 0 refills | Status: DC

## 2019-08-11 MED ORDER — INSULIN GLARGINE 100 UNIT/ML ~~LOC~~ SOLN: SUBCUTANEOUS | 0 refills | Status: DC

## 2019-08-11 NOTE — Progress Notes (Signed)
Set to no print.   Resent.   Christen Bame, CMA

## 2019-08-18 ENCOUNTER — Other Ambulatory Visit: Payer: Self-pay

## 2019-08-18 ENCOUNTER — Ambulatory Visit (HOSPITAL_COMMUNITY)
Admission: RE | Admit: 2019-08-18 | Discharge: 2019-08-18 | Disposition: A | Discharge status: Home / Self Care | Payer: Medicare Other | Source: Ambulatory Visit | Attending: Nephrology | Admitting: Nephrology

## 2019-08-18 ENCOUNTER — Encounter (HOSPITAL_COMMUNITY): Payer: Medicare Other

## 2019-08-18 VITALS — BP 137/50 | HR 67 | Temp 98.6°F | Resp 18

## 2019-08-18 DIAGNOSIS — D631 Anemia in chronic kidney disease: Secondary | ICD-10-CM | POA: Diagnosis not present

## 2019-08-18 DIAGNOSIS — N179 Acute kidney failure, unspecified: Secondary | ICD-10-CM | POA: Insufficient documentation

## 2019-08-18 DIAGNOSIS — N189 Chronic kidney disease, unspecified: Secondary | ICD-10-CM | POA: Insufficient documentation

## 2019-08-18 LAB — POCT HEMOGLOBIN-HEMACUE: Hemoglobin: 11.3 g/dL — ABNORMAL LOW (ref 12.0–15.0)

## 2019-08-18 MED ORDER — EPOETIN ALFA-EPBX 10000 UNIT/ML IJ SOLN
10000.0000 [IU] | INTRAMUSCULAR | Status: DC
  Administered 2019-08-18: 10000 [IU] via SUBCUTANEOUS
  Filled 2019-08-18: qty 1

## 2019-08-27 ENCOUNTER — Other Ambulatory Visit: Payer: Self-pay | Admitting: Internal Medicine

## 2019-08-27 ENCOUNTER — Other Ambulatory Visit: Payer: Self-pay

## 2019-08-27 MED ORDER — FUROSEMIDE 40 MG PO TABS: 40.0000 mg | ORAL_TABLET | Freq: Every day | ORAL | 3 refills | Status: DC

## 2019-08-31 DIAGNOSIS — H25813 Combined forms of age-related cataract, bilateral: Secondary | ICD-10-CM | POA: Diagnosis not present

## 2019-08-31 DIAGNOSIS — E119 Type 2 diabetes mellitus without complications: Secondary | ICD-10-CM | POA: Diagnosis not present

## 2019-08-31 DIAGNOSIS — Z794 Long term (current) use of insulin: Secondary | ICD-10-CM | POA: Diagnosis not present

## 2019-09-01 ENCOUNTER — Other Ambulatory Visit: Payer: Self-pay

## 2019-09-01 ENCOUNTER — Ambulatory Visit (HOSPITAL_COMMUNITY)
Admission: RE | Admit: 2019-09-01 | Discharge: 2019-09-01 | Disposition: A | Discharge status: Home / Self Care | Payer: Medicare Other | Source: Ambulatory Visit | Attending: Nephrology | Admitting: Nephrology

## 2019-09-01 VITALS — BP 138/53 | HR 72 | Temp 96.4°F

## 2019-09-01 DIAGNOSIS — D631 Anemia in chronic kidney disease: Secondary | ICD-10-CM | POA: Diagnosis not present

## 2019-09-01 DIAGNOSIS — N189 Chronic kidney disease, unspecified: Secondary | ICD-10-CM | POA: Diagnosis not present

## 2019-09-01 DIAGNOSIS — N179 Acute kidney failure, unspecified: Secondary | ICD-10-CM | POA: Diagnosis not present

## 2019-09-01 LAB — RENAL FUNCTION PANEL
Albumin: 3.5 g/dL (ref 3.5–5.0)
Anion gap: 11 (ref 5–15)
BUN: 83 mg/dL — ABNORMAL HIGH (ref 8–23)
CO2: 18 mmol/L — ABNORMAL LOW (ref 22–32)
Calcium: 9.2 mg/dL (ref 8.9–10.3)
Chloride: 109 mmol/L (ref 98–111)
Creatinine, Ser: 3.1 mg/dL — ABNORMAL HIGH (ref 0.44–1.00)
GFR calc Af Amer: 16 mL/min — ABNORMAL LOW (ref >60)
GFR calc non Af Amer: 14 mL/min — ABNORMAL LOW (ref >60)
Glucose, Bld: 186 mg/dL — ABNORMAL HIGH (ref 70–99)
Phosphorus: 4.5 mg/dL (ref 2.5–4.6)
Potassium: 4.5 mmol/L (ref 3.5–5.1)
Sodium: 138 mmol/L (ref 135–145)

## 2019-09-01 LAB — DIFFERENTIAL
Abs Immature Granulocytes: 0.01 10*3/uL (ref 0.00–0.07)
Basophils Absolute: 0 10*3/uL (ref 0.0–0.1)
Basophils Relative: 1 %
Eosinophils Absolute: 0.2 10*3/uL (ref 0.0–0.5)
Eosinophils Relative: 3 %
Immature Granulocytes: 0 %
Lymphocytes Relative: 23 %
Lymphs Abs: 1.9 10*3/uL (ref 0.7–4.0)
Monocytes Absolute: 0.5 10*3/uL (ref 0.1–1.0)
Monocytes Relative: 6 %
Neutro Abs: 5.3 10*3/uL (ref 1.7–7.7)
Neutrophils Relative %: 67 %

## 2019-09-01 LAB — IRON AND TIBC
Iron: 109 ug/dL (ref 28–170)
Saturation Ratios: 44 % — ABNORMAL HIGH (ref 10.4–31.8)
TIBC: 248 ug/dL — ABNORMAL LOW (ref 250–450)
UIBC: 139 ug/dL

## 2019-09-01 LAB — CBC
HCT: 33.5 % — ABNORMAL LOW (ref 36.0–46.0)
Hemoglobin: 10.5 g/dL — ABNORMAL LOW (ref 12.0–15.0)
MCH: 31.1 pg (ref 26.0–34.0)
MCHC: 31.3 g/dL (ref 30.0–36.0)
MCV: 99.1 fL (ref 80.0–100.0)
Platelets: 160 10*3/uL (ref 150–400)
RBC: 3.38 MIL/uL — ABNORMAL LOW (ref 3.87–5.11)
RDW: 12.5 % (ref 11.5–15.5)
WBC: 8 10*3/uL (ref 4.0–10.5)
nRBC: 0 % (ref 0.0–0.2)

## 2019-09-01 LAB — MAGNESIUM: Magnesium: 1.7 mg/dL (ref 1.7–2.4)

## 2019-09-01 LAB — FERRITIN: Ferritin: 527 ng/mL — ABNORMAL HIGH (ref 11–307)

## 2019-09-01 LAB — POCT HEMOGLOBIN-HEMACUE: Hemoglobin: 10.7 g/dL — ABNORMAL LOW (ref 12.0–15.0)

## 2019-09-01 MED ORDER — EPOETIN ALFA-EPBX 10000 UNIT/ML IJ SOLN
10000.0000 [IU] | INTRAMUSCULAR | Status: DC
  Administered 2019-09-01: 10000 [IU] via SUBCUTANEOUS
  Filled 2019-09-01: qty 1

## 2019-09-15 ENCOUNTER — Other Ambulatory Visit: Payer: Self-pay | Admitting: *Deleted

## 2019-09-15 ENCOUNTER — Encounter (HOSPITAL_COMMUNITY)
Admission: RE | Admit: 2019-09-15 | Discharge: 2019-09-15 | Disposition: A | Discharge status: Home / Self Care | Payer: Medicare Other | Source: Ambulatory Visit | Attending: Nephrology | Admitting: Nephrology

## 2019-09-15 ENCOUNTER — Other Ambulatory Visit: Payer: Self-pay

## 2019-09-15 VITALS — BP 130/53 | HR 66 | Temp 97.0°F | Resp 20

## 2019-09-15 DIAGNOSIS — N179 Acute kidney failure, unspecified: Secondary | ICD-10-CM | POA: Diagnosis not present

## 2019-09-15 DIAGNOSIS — D631 Anemia in chronic kidney disease: Secondary | ICD-10-CM | POA: Insufficient documentation

## 2019-09-15 DIAGNOSIS — N189 Chronic kidney disease, unspecified: Secondary | ICD-10-CM | POA: Diagnosis not present

## 2019-09-15 MED ORDER — HYDRALAZINE HCL 50 MG PO TABS: 75.0000 mg | ORAL_TABLET | Freq: Three times a day (TID) | ORAL | 2 refills | Status: DC

## 2019-09-15 MED ORDER — EPOETIN ALFA-EPBX 10000 UNIT/ML IJ SOLN
10000.0000 [IU] | INTRAMUSCULAR | Status: DC
  Administered 2019-09-15: 10000 [IU] via SUBCUTANEOUS
  Filled 2019-09-15: qty 1

## 2019-09-16 LAB — POCT HEMOGLOBIN-HEMACUE: Hemoglobin: 10.5 g/dL — ABNORMAL LOW (ref 12.0–15.0)

## 2019-09-17 DIAGNOSIS — H2589 Other age-related cataract: Secondary | ICD-10-CM | POA: Diagnosis not present

## 2019-09-17 DIAGNOSIS — H2512 Age-related nuclear cataract, left eye: Secondary | ICD-10-CM | POA: Diagnosis not present

## 2019-09-21 DIAGNOSIS — N184 Chronic kidney disease, stage 4 (severe): Secondary | ICD-10-CM | POA: Diagnosis not present

## 2019-09-21 DIAGNOSIS — Z1159 Encounter for screening for other viral diseases: Secondary | ICD-10-CM | POA: Diagnosis not present

## 2019-09-21 DIAGNOSIS — I129 Hypertensive chronic kidney disease with stage 1 through stage 4 chronic kidney disease, or unspecified chronic kidney disease: Secondary | ICD-10-CM | POA: Diagnosis not present

## 2019-09-21 DIAGNOSIS — D631 Anemia in chronic kidney disease: Secondary | ICD-10-CM | POA: Diagnosis not present

## 2019-09-21 DIAGNOSIS — N2581 Secondary hyperparathyroidism of renal origin: Secondary | ICD-10-CM | POA: Diagnosis not present

## 2019-09-29 ENCOUNTER — Encounter (HOSPITAL_COMMUNITY)
Admission: RE | Admit: 2019-09-29 | Discharge: 2019-09-29 | Disposition: A | Discharge status: Home / Self Care | Payer: Medicare Other | Source: Ambulatory Visit | Attending: Nephrology | Admitting: Nephrology

## 2019-09-29 ENCOUNTER — Other Ambulatory Visit: Payer: Self-pay

## 2019-09-29 VITALS — BP 144/51 | HR 65 | Temp 96.5°F | Resp 20

## 2019-09-29 DIAGNOSIS — D631 Anemia in chronic kidney disease: Secondary | ICD-10-CM | POA: Diagnosis not present

## 2019-09-29 DIAGNOSIS — N189 Chronic kidney disease, unspecified: Secondary | ICD-10-CM | POA: Diagnosis not present

## 2019-09-29 DIAGNOSIS — N179 Acute kidney failure, unspecified: Secondary | ICD-10-CM | POA: Insufficient documentation

## 2019-09-29 LAB — CBC
HCT: 34.5 % — ABNORMAL LOW (ref 36.0–46.0)
Hemoglobin: 10.6 g/dL — ABNORMAL LOW (ref 12.0–15.0)
MCH: 30.4 pg (ref 26.0–34.0)
MCHC: 30.7 g/dL (ref 30.0–36.0)
MCV: 98.9 fL (ref 80.0–100.0)
Platelets: 152 10*3/uL (ref 150–400)
RBC: 3.49 MIL/uL — ABNORMAL LOW (ref 3.87–5.11)
RDW: 12.5 % (ref 11.5–15.5)
WBC: 9.9 10*3/uL (ref 4.0–10.5)
nRBC: 0 % (ref 0.0–0.2)

## 2019-09-29 LAB — DIFFERENTIAL
Abs Immature Granulocytes: 0.04 10*3/uL (ref 0.00–0.07)
Basophils Absolute: 0 10*3/uL (ref 0.0–0.1)
Basophils Relative: 0 %
Eosinophils Absolute: 0.3 10*3/uL (ref 0.0–0.5)
Eosinophils Relative: 3 %
Immature Granulocytes: 0 %
Lymphocytes Relative: 21 %
Lymphs Abs: 2.1 10*3/uL (ref 0.7–4.0)
Monocytes Absolute: 0.6 10*3/uL (ref 0.1–1.0)
Monocytes Relative: 6 %
Neutro Abs: 6.9 10*3/uL (ref 1.7–7.7)
Neutrophils Relative %: 70 %

## 2019-09-29 LAB — RENAL FUNCTION PANEL
Albumin: 3.6 g/dL (ref 3.5–5.0)
Anion gap: 13 (ref 5–15)
BUN: 89 mg/dL — ABNORMAL HIGH (ref 8–23)
CO2: 17 mmol/L — ABNORMAL LOW (ref 22–32)
Calcium: 9.4 mg/dL (ref 8.9–10.3)
Chloride: 109 mmol/L (ref 98–111)
Creatinine, Ser: 3.49 mg/dL — ABNORMAL HIGH (ref 0.44–1.00)
GFR calc Af Amer: 14 mL/min — ABNORMAL LOW (ref >60)
GFR calc non Af Amer: 12 mL/min — ABNORMAL LOW (ref >60)
Glucose, Bld: 138 mg/dL — ABNORMAL HIGH (ref 70–99)
Phosphorus: 4.6 mg/dL (ref 2.5–4.6)
Potassium: 4.7 mmol/L (ref 3.5–5.1)
Sodium: 139 mmol/L (ref 135–145)

## 2019-09-29 LAB — IRON AND TIBC
Iron: 85 ug/dL (ref 28–170)
Saturation Ratios: 34 % — ABNORMAL HIGH (ref 10.4–31.8)
TIBC: 251 ug/dL (ref 250–450)
UIBC: 166 ug/dL

## 2019-09-29 LAB — POCT HEMOGLOBIN-HEMACUE: Hemoglobin: 10.8 g/dL — ABNORMAL LOW (ref 12.0–15.0)

## 2019-09-29 LAB — FERRITIN: Ferritin: 513 ng/mL — ABNORMAL HIGH (ref 11–307)

## 2019-09-29 LAB — MAGNESIUM: Magnesium: 1.7 mg/dL (ref 1.7–2.4)

## 2019-09-29 MED ORDER — EPOETIN ALFA-EPBX 10000 UNIT/ML IJ SOLN
10000.0000 [IU] | INTRAMUSCULAR | Status: DC
  Administered 2019-09-29: 10000 [IU] via SUBCUTANEOUS
  Filled 2019-09-29: qty 1

## 2019-10-13 ENCOUNTER — Ambulatory Visit (HOSPITAL_COMMUNITY)
Admission: RE | Admit: 2019-10-13 | Discharge: 2019-10-13 | Disposition: A | Discharge status: Home / Self Care | Payer: Medicare Other | Source: Ambulatory Visit | Attending: Nephrology | Admitting: Nephrology

## 2019-10-13 ENCOUNTER — Other Ambulatory Visit: Payer: Self-pay

## 2019-10-13 VITALS — BP 128/74 | HR 70 | Resp 20

## 2019-10-13 DIAGNOSIS — N189 Chronic kidney disease, unspecified: Secondary | ICD-10-CM | POA: Insufficient documentation

## 2019-10-13 DIAGNOSIS — D631 Anemia in chronic kidney disease: Secondary | ICD-10-CM | POA: Diagnosis not present

## 2019-10-13 DIAGNOSIS — N179 Acute kidney failure, unspecified: Secondary | ICD-10-CM | POA: Insufficient documentation

## 2019-10-13 LAB — POCT HEMOGLOBIN-HEMACUE: Hemoglobin: 10 g/dL — ABNORMAL LOW (ref 12.0–15.0)

## 2019-10-13 MED ORDER — EPOETIN ALFA-EPBX 10000 UNIT/ML IJ SOLN
10000.0000 [IU] | INTRAMUSCULAR | Status: DC
  Administered 2019-10-13: 10000 [IU] via SUBCUTANEOUS
  Filled 2019-10-13: qty 1

## 2019-10-27 ENCOUNTER — Ambulatory Visit (HOSPITAL_COMMUNITY)
Admission: RE | Admit: 2019-10-27 | Discharge: 2019-10-27 | Disposition: A | Discharge status: Home / Self Care | Payer: Medicare Other | Source: Ambulatory Visit | Attending: Nephrology | Admitting: Nephrology

## 2019-10-27 ENCOUNTER — Other Ambulatory Visit: Payer: Self-pay

## 2019-10-27 VITALS — BP 136/54 | HR 73 | Temp 97.8°F | Resp 16 | Ht 63.0 in | Wt 195.0 lb

## 2019-10-27 DIAGNOSIS — N179 Acute kidney failure, unspecified: Secondary | ICD-10-CM | POA: Diagnosis not present

## 2019-10-27 DIAGNOSIS — N189 Chronic kidney disease, unspecified: Secondary | ICD-10-CM | POA: Insufficient documentation

## 2019-10-27 DIAGNOSIS — D631 Anemia in chronic kidney disease: Secondary | ICD-10-CM | POA: Diagnosis not present

## 2019-10-27 LAB — DIFFERENTIAL
Abs Immature Granulocytes: 0.04 10*3/uL (ref 0.00–0.07)
Basophils Absolute: 0.1 10*3/uL (ref 0.0–0.1)
Basophils Relative: 1 %
Eosinophils Absolute: 0.3 10*3/uL (ref 0.0–0.5)
Eosinophils Relative: 3 %
Immature Granulocytes: 0 %
Lymphocytes Relative: 18 %
Lymphs Abs: 1.8 10*3/uL (ref 0.7–4.0)
Monocytes Absolute: 0.7 10*3/uL (ref 0.1–1.0)
Monocytes Relative: 6 %
Neutro Abs: 7.3 10*3/uL (ref 1.7–7.7)
Neutrophils Relative %: 72 %

## 2019-10-27 LAB — IRON AND TIBC
Iron: 93 ug/dL (ref 28–170)
Saturation Ratios: 37 % — ABNORMAL HIGH (ref 10.4–31.8)
TIBC: 253 ug/dL (ref 250–450)
UIBC: 160 ug/dL

## 2019-10-27 LAB — POCT HEMOGLOBIN-HEMACUE: Hemoglobin: 12.4 g/dL (ref 12.0–15.0)

## 2019-10-27 LAB — RENAL FUNCTION PANEL
Albumin: 3.7 g/dL (ref 3.5–5.0)
Anion gap: 13 (ref 5–15)
BUN: 89 mg/dL — ABNORMAL HIGH (ref 8–23)
CO2: 19 mmol/L — ABNORMAL LOW (ref 22–32)
Calcium: 9.2 mg/dL (ref 8.9–10.3)
Chloride: 106 mmol/L (ref 98–111)
Creatinine, Ser: 3.1 mg/dL — ABNORMAL HIGH (ref 0.44–1.00)
GFR calc Af Amer: 16 mL/min — ABNORMAL LOW (ref >60)
GFR calc non Af Amer: 14 mL/min — ABNORMAL LOW (ref >60)
Glucose, Bld: 177 mg/dL — ABNORMAL HIGH (ref 70–99)
Phosphorus: 4.6 mg/dL (ref 2.5–4.6)
Potassium: 4.4 mmol/L (ref 3.5–5.1)
Sodium: 138 mmol/L (ref 135–145)

## 2019-10-27 LAB — MAGNESIUM: Magnesium: 1.6 mg/dL — ABNORMAL LOW (ref 1.7–2.4)

## 2019-10-27 LAB — CBC
HCT: 34.9 % — ABNORMAL LOW (ref 36.0–46.0)
Hemoglobin: 10.8 g/dL — ABNORMAL LOW (ref 12.0–15.0)
MCH: 30.4 pg (ref 26.0–34.0)
MCHC: 30.9 g/dL (ref 30.0–36.0)
MCV: 98.3 fL (ref 80.0–100.0)
Platelets: 163 10*3/uL (ref 150–400)
RBC: 3.55 MIL/uL — ABNORMAL LOW (ref 3.87–5.11)
RDW: 12.5 % (ref 11.5–15.5)
WBC: 10.1 10*3/uL (ref 4.0–10.5)
nRBC: 0 % (ref 0.0–0.2)

## 2019-10-27 LAB — FERRITIN: Ferritin: 617 ng/mL — ABNORMAL HIGH (ref 11–307)

## 2019-10-27 MED ORDER — EPOETIN ALFA-EPBX 10000 UNIT/ML IJ SOLN
10000.0000 [IU] | INTRAMUSCULAR | Status: DC
  Filled 2019-10-27: qty 1

## 2019-11-10 ENCOUNTER — Other Ambulatory Visit: Payer: Self-pay

## 2019-11-10 ENCOUNTER — Ambulatory Visit (HOSPITAL_COMMUNITY)
Admission: RE | Admit: 2019-11-10 | Discharge: 2019-11-10 | Disposition: A | Discharge status: Home / Self Care | Payer: Medicare Other | Source: Ambulatory Visit | Attending: Nephrology | Admitting: Nephrology

## 2019-11-10 VITALS — BP 140/53 | HR 63 | Temp 96.6°F | Resp 20

## 2019-11-10 DIAGNOSIS — D631 Anemia in chronic kidney disease: Secondary | ICD-10-CM | POA: Insufficient documentation

## 2019-11-10 DIAGNOSIS — N189 Chronic kidney disease, unspecified: Secondary | ICD-10-CM | POA: Insufficient documentation

## 2019-11-10 DIAGNOSIS — N179 Acute kidney failure, unspecified: Secondary | ICD-10-CM | POA: Diagnosis present

## 2019-11-10 LAB — POCT HEMOGLOBIN-HEMACUE: Hemoglobin: 9.7 g/dL — ABNORMAL LOW (ref 12.0–15.0)

## 2019-11-10 MED ORDER — EPOETIN ALFA-EPBX 10000 UNIT/ML IJ SOLN
10000.0000 [IU] | INTRAMUSCULAR | Status: DC
  Administered 2019-11-10: 10000 [IU] via SUBCUTANEOUS

## 2019-11-10 MED ORDER — EPOETIN ALFA-EPBX 10000 UNIT/ML IJ SOLN
INTRAMUSCULAR | Status: AC
  Filled 2019-11-10: qty 1

## 2019-11-24 ENCOUNTER — Other Ambulatory Visit: Payer: Self-pay

## 2019-11-24 ENCOUNTER — Ambulatory Visit (HOSPITAL_COMMUNITY)
Admission: RE | Admit: 2019-11-24 | Discharge: 2019-11-24 | Disposition: A | Discharge status: Home / Self Care | Payer: Medicare Other | Source: Ambulatory Visit | Attending: Nephrology | Admitting: Nephrology

## 2019-11-24 VITALS — BP 144/53 | HR 71 | Temp 96.4°F | Resp 20

## 2019-11-24 DIAGNOSIS — D631 Anemia in chronic kidney disease: Secondary | ICD-10-CM | POA: Diagnosis present

## 2019-11-24 DIAGNOSIS — N189 Chronic kidney disease, unspecified: Secondary | ICD-10-CM | POA: Insufficient documentation

## 2019-11-24 DIAGNOSIS — N179 Acute kidney failure, unspecified: Secondary | ICD-10-CM | POA: Diagnosis present

## 2019-11-24 MED ORDER — EPOETIN ALFA-EPBX 10000 UNIT/ML IJ SOLN
INTRAMUSCULAR | Status: AC
  Filled 2019-11-24: qty 1

## 2019-11-24 MED ORDER — EPOETIN ALFA-EPBX 10000 UNIT/ML IJ SOLN
10000.0000 [IU] | INTRAMUSCULAR | Status: DC
  Administered 2019-11-24: 10000 [IU] via SUBCUTANEOUS

## 2019-11-25 LAB — POCT HEMOGLOBIN-HEMACUE: Hemoglobin: 9.5 g/dL — ABNORMAL LOW (ref 12.0–15.0)

## 2019-12-08 ENCOUNTER — Ambulatory Visit (HOSPITAL_COMMUNITY)
Admission: RE | Admit: 2019-12-08 | Discharge: 2019-12-08 | Disposition: A | Discharge status: Home / Self Care | Payer: Medicare HMO | Source: Ambulatory Visit | Attending: Nephrology | Admitting: Nephrology

## 2019-12-08 ENCOUNTER — Other Ambulatory Visit: Payer: Self-pay

## 2019-12-08 VITALS — BP 125/53 | HR 67 | Temp 96.8°F | Resp 20

## 2019-12-08 DIAGNOSIS — N179 Acute kidney failure, unspecified: Secondary | ICD-10-CM | POA: Diagnosis present

## 2019-12-08 DIAGNOSIS — D631 Anemia in chronic kidney disease: Secondary | ICD-10-CM | POA: Insufficient documentation

## 2019-12-08 DIAGNOSIS — N189 Chronic kidney disease, unspecified: Secondary | ICD-10-CM | POA: Insufficient documentation

## 2019-12-08 LAB — DIFFERENTIAL
Abs Immature Granulocytes: 0.05 10*3/uL (ref 0.00–0.07)
Basophils Absolute: 0 10*3/uL (ref 0.0–0.1)
Basophils Relative: 0 %
Eosinophils Absolute: 0.3 10*3/uL (ref 0.0–0.5)
Eosinophils Relative: 3 %
Immature Granulocytes: 0 %
Lymphocytes Relative: 21 %
Lymphs Abs: 2.4 10*3/uL (ref 0.7–4.0)
Monocytes Absolute: 0.6 10*3/uL (ref 0.1–1.0)
Monocytes Relative: 5 %
Neutro Abs: 8 10*3/uL — ABNORMAL HIGH (ref 1.7–7.7)
Neutrophils Relative %: 71 %

## 2019-12-08 LAB — MAGNESIUM: Magnesium: 1.5 mg/dL — ABNORMAL LOW (ref 1.7–2.4)

## 2019-12-08 LAB — IRON AND TIBC
Iron: 78 ug/dL (ref 28–170)
Saturation Ratios: 32 % — ABNORMAL HIGH (ref 10.4–31.8)
TIBC: 241 ug/dL — ABNORMAL LOW (ref 250–450)
UIBC: 163 ug/dL

## 2019-12-08 LAB — CBC
HCT: 30.9 % — ABNORMAL LOW (ref 36.0–46.0)
Hemoglobin: 9.5 g/dL — ABNORMAL LOW (ref 12.0–15.0)
MCH: 30.2 pg (ref 26.0–34.0)
MCHC: 30.7 g/dL (ref 30.0–36.0)
MCV: 98.1 fL (ref 80.0–100.0)
Platelets: 159 10*3/uL (ref 150–400)
RBC: 3.15 MIL/uL — ABNORMAL LOW (ref 3.87–5.11)
RDW: 12.8 % (ref 11.5–15.5)
WBC: 11.4 10*3/uL — ABNORMAL HIGH (ref 4.0–10.5)
nRBC: 0 % (ref 0.0–0.2)

## 2019-12-08 LAB — POCT HEMOGLOBIN-HEMACUE: Hemoglobin: 9.3 g/dL — ABNORMAL LOW (ref 12.0–15.0)

## 2019-12-08 LAB — RENAL FUNCTION PANEL
Albumin: 3.4 g/dL — ABNORMAL LOW (ref 3.5–5.0)
Anion gap: 10 (ref 5–15)
BUN: 88 mg/dL — ABNORMAL HIGH (ref 8–23)
CO2: 18 mmol/L — ABNORMAL LOW (ref 22–32)
Calcium: 9.3 mg/dL (ref 8.9–10.3)
Chloride: 114 mmol/L — ABNORMAL HIGH (ref 98–111)
Creatinine, Ser: 3.03 mg/dL — ABNORMAL HIGH (ref 0.44–1.00)
GFR calc Af Amer: 17 mL/min — ABNORMAL LOW (ref >60)
GFR calc non Af Amer: 14 mL/min — ABNORMAL LOW (ref >60)
Glucose, Bld: 155 mg/dL — ABNORMAL HIGH (ref 70–99)
Phosphorus: 4.5 mg/dL (ref 2.5–4.6)
Potassium: 4.7 mmol/L (ref 3.5–5.1)
Sodium: 142 mmol/L (ref 135–145)

## 2019-12-08 LAB — FERRITIN: Ferritin: 552 ng/mL — ABNORMAL HIGH (ref 11–307)

## 2019-12-08 MED ORDER — EPOETIN ALFA-EPBX 10000 UNIT/ML IJ SOLN
INTRAMUSCULAR | Status: AC
  Administered 2019-12-08: 10000 [IU] via SUBCUTANEOUS
  Filled 2019-12-08: qty 1

## 2019-12-08 MED ORDER — EPOETIN ALFA-EPBX 10000 UNIT/ML IJ SOLN: 10000.0000 [IU] | INTRAMUSCULAR | Status: DC

## 2019-12-08 MED ORDER — EPOETIN ALFA-EPBX 10000 UNIT/ML IJ SOLN
INTRAMUSCULAR | Status: AC
  Filled 2019-12-08: qty 1

## 2019-12-10 ENCOUNTER — Other Ambulatory Visit: Payer: Self-pay

## 2019-12-12 MED ORDER — "INSULIN SYRINGE-NEEDLE U-100 31G X 5/16"" 1 ML MISC": 11 refills | Status: DC

## 2019-12-22 ENCOUNTER — Ambulatory Visit (HOSPITAL_COMMUNITY)
Admission: RE | Admit: 2019-12-22 | Discharge: 2019-12-22 | Disposition: A | Discharge status: Home / Self Care | Payer: Medicare HMO | Source: Ambulatory Visit | Attending: Nephrology | Admitting: Nephrology

## 2019-12-22 ENCOUNTER — Other Ambulatory Visit: Payer: Self-pay

## 2019-12-22 VITALS — BP 122/46 | HR 65 | Temp 96.9°F | Resp 20

## 2019-12-22 DIAGNOSIS — D631 Anemia in chronic kidney disease: Secondary | ICD-10-CM | POA: Diagnosis present

## 2019-12-22 DIAGNOSIS — N189 Chronic kidney disease, unspecified: Secondary | ICD-10-CM | POA: Diagnosis not present

## 2019-12-22 DIAGNOSIS — N179 Acute kidney failure, unspecified: Secondary | ICD-10-CM | POA: Insufficient documentation

## 2019-12-22 LAB — POCT HEMOGLOBIN-HEMACUE: Hemoglobin: 9.8 g/dL — ABNORMAL LOW (ref 12.0–15.0)

## 2019-12-22 MED ORDER — EPOETIN ALFA-EPBX 10000 UNIT/ML IJ SOLN
10000.0000 [IU] | INTRAMUSCULAR | Status: DC
  Administered 2019-12-22: 10000 [IU] via SUBCUTANEOUS

## 2019-12-22 MED ORDER — EPOETIN ALFA-EPBX 10000 UNIT/ML IJ SOLN
INTRAMUSCULAR | Status: AC
  Filled 2019-12-22: qty 1

## 2020-01-05 ENCOUNTER — Other Ambulatory Visit: Payer: Self-pay

## 2020-01-05 ENCOUNTER — Ambulatory Visit (HOSPITAL_COMMUNITY)
Admission: RE | Admit: 2020-01-05 | Discharge: 2020-01-05 | Disposition: A | Discharge status: Home / Self Care | Payer: Medicare HMO | Source: Ambulatory Visit | Attending: Nephrology | Admitting: Nephrology

## 2020-01-05 VITALS — BP 140/61 | HR 70 | Resp 18

## 2020-01-05 DIAGNOSIS — N189 Chronic kidney disease, unspecified: Secondary | ICD-10-CM | POA: Insufficient documentation

## 2020-01-05 DIAGNOSIS — D631 Anemia in chronic kidney disease: Secondary | ICD-10-CM | POA: Insufficient documentation

## 2020-01-05 DIAGNOSIS — N179 Acute kidney failure, unspecified: Secondary | ICD-10-CM | POA: Insufficient documentation

## 2020-01-05 LAB — DIFFERENTIAL
Abs Immature Granulocytes: 0.03 10*3/uL (ref 0.00–0.07)
Basophils Absolute: 0.1 10*3/uL (ref 0.0–0.1)
Basophils Relative: 1 %
Eosinophils Absolute: 0.3 10*3/uL (ref 0.0–0.5)
Eosinophils Relative: 3 %
Immature Granulocytes: 0 %
Lymphocytes Relative: 20 %
Lymphs Abs: 1.9 10*3/uL (ref 0.7–4.0)
Monocytes Absolute: 0.5 10*3/uL (ref 0.1–1.0)
Monocytes Relative: 6 %
Neutro Abs: 6.8 10*3/uL (ref 1.7–7.7)
Neutrophils Relative %: 70 %

## 2020-01-05 LAB — IRON AND TIBC
Iron: 83 ug/dL (ref 28–170)
Saturation Ratios: 31 % (ref 10.4–31.8)
TIBC: 269 ug/dL (ref 250–450)
UIBC: 186 ug/dL

## 2020-01-05 LAB — CBC
HCT: 32.8 % — ABNORMAL LOW (ref 36.0–46.0)
Hemoglobin: 10.1 g/dL — ABNORMAL LOW (ref 12.0–15.0)
MCH: 30.5 pg (ref 26.0–34.0)
MCHC: 30.8 g/dL (ref 30.0–36.0)
MCV: 99.1 fL (ref 80.0–100.0)
Platelets: 169 10*3/uL (ref 150–400)
RBC: 3.31 MIL/uL — ABNORMAL LOW (ref 3.87–5.11)
RDW: 12.7 % (ref 11.5–15.5)
WBC: 9.6 10*3/uL (ref 4.0–10.5)
nRBC: 0 % (ref 0.0–0.2)

## 2020-01-05 LAB — MAGNESIUM: Magnesium: 1.6 mg/dL — ABNORMAL LOW (ref 1.7–2.4)

## 2020-01-05 LAB — RENAL FUNCTION PANEL
Albumin: 3.7 g/dL (ref 3.5–5.0)
Anion gap: 9 (ref 5–15)
BUN: 74 mg/dL — ABNORMAL HIGH (ref 8–23)
CO2: 20 mmol/L — ABNORMAL LOW (ref 22–32)
Calcium: 9.3 mg/dL (ref 8.9–10.3)
Chloride: 110 mmol/L (ref 98–111)
Creatinine, Ser: 2.9 mg/dL — ABNORMAL HIGH (ref 0.44–1.00)
GFR calc Af Amer: 17 mL/min — ABNORMAL LOW (ref >60)
GFR calc non Af Amer: 15 mL/min — ABNORMAL LOW (ref >60)
Glucose, Bld: 147 mg/dL — ABNORMAL HIGH (ref 70–99)
Phosphorus: 4.7 mg/dL — ABNORMAL HIGH (ref 2.5–4.6)
Potassium: 4.7 mmol/L (ref 3.5–5.1)
Sodium: 139 mmol/L (ref 135–145)

## 2020-01-05 LAB — FERRITIN: Ferritin: 539 ng/mL — ABNORMAL HIGH (ref 11–307)

## 2020-01-05 LAB — POCT HEMOGLOBIN-HEMACUE: Hemoglobin: 11.6 g/dL — ABNORMAL LOW (ref 12.0–15.0)

## 2020-01-05 MED ORDER — EPOETIN ALFA-EPBX 10000 UNIT/ML IJ SOLN
INTRAMUSCULAR | Status: AC
  Filled 2020-01-05: qty 1

## 2020-01-05 MED ORDER — EPOETIN ALFA-EPBX 10000 UNIT/ML IJ SOLN
10000.0000 [IU] | INTRAMUSCULAR | Status: DC
  Administered 2020-01-05: 10000 [IU] via SUBCUTANEOUS

## 2020-01-19 ENCOUNTER — Ambulatory Visit (HOSPITAL_COMMUNITY)
Admission: RE | Admit: 2020-01-19 | Discharge: 2020-01-19 | Disposition: A | Discharge status: Home / Self Care | Payer: Medicare HMO | Source: Ambulatory Visit | Attending: Nephrology | Admitting: Nephrology

## 2020-01-19 ENCOUNTER — Other Ambulatory Visit: Payer: Self-pay

## 2020-01-19 VITALS — BP 152/55 | HR 64 | Resp 18

## 2020-01-19 DIAGNOSIS — D631 Anemia in chronic kidney disease: Secondary | ICD-10-CM | POA: Diagnosis present

## 2020-01-19 DIAGNOSIS — N179 Acute kidney failure, unspecified: Secondary | ICD-10-CM | POA: Insufficient documentation

## 2020-01-19 DIAGNOSIS — N189 Chronic kidney disease, unspecified: Secondary | ICD-10-CM | POA: Diagnosis not present

## 2020-01-19 LAB — POCT HEMOGLOBIN-HEMACUE: Hemoglobin: 9.9 g/dL — ABNORMAL LOW (ref 12.0–15.0)

## 2020-01-19 MED ORDER — EPOETIN ALFA-EPBX 10000 UNIT/ML IJ SOLN
INTRAMUSCULAR | Status: AC
  Filled 2020-01-19: qty 1

## 2020-01-19 MED ORDER — EPOETIN ALFA-EPBX 10000 UNIT/ML IJ SOLN
10000.0000 [IU] | INTRAMUSCULAR | Status: DC
  Administered 2020-01-19: 10000 [IU] via SUBCUTANEOUS

## 2020-02-02 ENCOUNTER — Other Ambulatory Visit: Payer: Self-pay

## 2020-02-02 ENCOUNTER — Ambulatory Visit (HOSPITAL_COMMUNITY)
Admission: RE | Admit: 2020-02-02 | Discharge: 2020-02-02 | Disposition: A | Discharge status: Home / Self Care | Payer: Medicare HMO | Source: Ambulatory Visit | Attending: Nephrology | Admitting: Nephrology

## 2020-02-02 VITALS — BP 134/52 | HR 68 | Temp 96.6°F | Resp 20

## 2020-02-02 DIAGNOSIS — N179 Acute kidney failure, unspecified: Secondary | ICD-10-CM | POA: Diagnosis present

## 2020-02-02 DIAGNOSIS — N189 Chronic kidney disease, unspecified: Secondary | ICD-10-CM | POA: Insufficient documentation

## 2020-02-02 DIAGNOSIS — D631 Anemia in chronic kidney disease: Secondary | ICD-10-CM | POA: Diagnosis present

## 2020-02-02 LAB — IRON AND TIBC
Iron: 87 ug/dL (ref 28–170)
Saturation Ratios: 34 % — ABNORMAL HIGH (ref 10.4–31.8)
TIBC: 256 ug/dL (ref 250–450)
UIBC: 169 ug/dL

## 2020-02-02 LAB — DIFFERENTIAL
Abs Immature Granulocytes: 0.03 10*3/uL (ref 0.00–0.07)
Basophils Absolute: 0.1 10*3/uL (ref 0.0–0.1)
Basophils Relative: 1 %
Eosinophils Absolute: 0.3 10*3/uL (ref 0.0–0.5)
Eosinophils Relative: 4 %
Immature Granulocytes: 0 %
Lymphocytes Relative: 18 %
Lymphs Abs: 1.8 10*3/uL (ref 0.7–4.0)
Monocytes Absolute: 0.5 10*3/uL (ref 0.1–1.0)
Monocytes Relative: 5 %
Neutro Abs: 7.2 10*3/uL (ref 1.7–7.7)
Neutrophils Relative %: 72 %

## 2020-02-02 LAB — RENAL FUNCTION PANEL
Albumin: 3.6 g/dL (ref 3.5–5.0)
Anion gap: 11 (ref 5–15)
BUN: 86 mg/dL — ABNORMAL HIGH (ref 8–23)
CO2: 20 mmol/L — ABNORMAL LOW (ref 22–32)
Calcium: 9.3 mg/dL (ref 8.9–10.3)
Chloride: 110 mmol/L (ref 98–111)
Creatinine, Ser: 3.71 mg/dL — ABNORMAL HIGH (ref 0.44–1.00)
GFR calc Af Amer: 13 mL/min — ABNORMAL LOW (ref >60)
GFR calc non Af Amer: 11 mL/min — ABNORMAL LOW (ref >60)
Glucose, Bld: 178 mg/dL — ABNORMAL HIGH (ref 70–99)
Phosphorus: 4.5 mg/dL (ref 2.5–4.6)
Potassium: 4.9 mmol/L (ref 3.5–5.1)
Sodium: 141 mmol/L (ref 135–145)

## 2020-02-02 LAB — CBC
HCT: 33.6 % — ABNORMAL LOW (ref 36.0–46.0)
Hemoglobin: 10.3 g/dL — ABNORMAL LOW (ref 12.0–15.0)
MCH: 30.2 pg (ref 26.0–34.0)
MCHC: 30.7 g/dL (ref 30.0–36.0)
MCV: 98.5 fL (ref 80.0–100.0)
Platelets: 168 10*3/uL (ref 150–400)
RBC: 3.41 MIL/uL — ABNORMAL LOW (ref 3.87–5.11)
RDW: 12.3 % (ref 11.5–15.5)
WBC: 9.8 10*3/uL (ref 4.0–10.5)
nRBC: 0 % (ref 0.0–0.2)

## 2020-02-02 LAB — POCT HEMOGLOBIN-HEMACUE: Hemoglobin: 10.1 g/dL — ABNORMAL LOW (ref 12.0–15.0)

## 2020-02-02 LAB — MAGNESIUM: Magnesium: 1.7 mg/dL (ref 1.7–2.4)

## 2020-02-02 LAB — FERRITIN: Ferritin: 502 ng/mL — ABNORMAL HIGH (ref 11–307)

## 2020-02-02 MED ORDER — EPOETIN ALFA-EPBX 10000 UNIT/ML IJ SOLN
INTRAMUSCULAR | Status: AC
  Administered 2020-02-02: 13:00:00 10000 [IU] via SUBCUTANEOUS
  Filled 2020-02-02: qty 1

## 2020-02-02 MED ORDER — EPOETIN ALFA-EPBX 10000 UNIT/ML IJ SOLN: 10000.0000 [IU] | INTRAMUSCULAR | Status: DC

## 2020-02-03 ENCOUNTER — Other Ambulatory Visit: Payer: Self-pay | Admitting: Family Medicine

## 2020-02-03 DIAGNOSIS — Z794 Long term (current) use of insulin: Secondary | ICD-10-CM

## 2020-02-03 DIAGNOSIS — E1165 Type 2 diabetes mellitus with hyperglycemia: Secondary | ICD-10-CM

## 2020-02-11 ENCOUNTER — Other Ambulatory Visit: Payer: Self-pay | Admitting: *Deleted

## 2020-02-11 MED ORDER — HYDRALAZINE HCL 50 MG PO TABS: 75.0000 mg | ORAL_TABLET | Freq: Three times a day (TID) | ORAL | 1 refills | Status: DC

## 2020-02-16 ENCOUNTER — Ambulatory Visit (HOSPITAL_COMMUNITY)
Admission: RE | Admit: 2020-02-16 | Discharge: 2020-02-16 | Disposition: A | Discharge status: Home / Self Care | Payer: Medicare HMO | Source: Ambulatory Visit | Attending: Nephrology | Admitting: Nephrology

## 2020-02-16 ENCOUNTER — Other Ambulatory Visit: Payer: Self-pay

## 2020-02-16 VITALS — BP 135/50 | HR 64 | Temp 96.7°F | Resp 20

## 2020-02-16 DIAGNOSIS — N189 Chronic kidney disease, unspecified: Secondary | ICD-10-CM | POA: Diagnosis not present

## 2020-02-16 DIAGNOSIS — D631 Anemia in chronic kidney disease: Secondary | ICD-10-CM | POA: Diagnosis present

## 2020-02-16 DIAGNOSIS — N179 Acute kidney failure, unspecified: Secondary | ICD-10-CM | POA: Diagnosis present

## 2020-02-16 LAB — POCT HEMOGLOBIN-HEMACUE: Hemoglobin: 9.8 g/dL — ABNORMAL LOW (ref 12.0–15.0)

## 2020-02-16 MED ORDER — EPOETIN ALFA-EPBX 10000 UNIT/ML IJ SOLN: 10000.0000 [IU] | INTRAMUSCULAR | Status: DC

## 2020-02-16 MED ORDER — EPOETIN ALFA-EPBX 10000 UNIT/ML IJ SOLN
INTRAMUSCULAR | Status: AC
  Administered 2020-02-16: 10000 [IU]
  Filled 2020-02-16: qty 1

## 2020-03-01 ENCOUNTER — Encounter (HOSPITAL_COMMUNITY)
Admission: RE | Admit: 2020-03-01 | Discharge: 2020-03-01 | Disposition: A | Discharge status: Home / Self Care | Payer: Medicare HMO | Source: Ambulatory Visit | Attending: Nephrology | Admitting: Nephrology

## 2020-03-01 ENCOUNTER — Other Ambulatory Visit: Payer: Self-pay

## 2020-03-01 VITALS — BP 155/51 | HR 67 | Temp 96.0°F | Resp 20

## 2020-03-01 DIAGNOSIS — N189 Chronic kidney disease, unspecified: Secondary | ICD-10-CM | POA: Insufficient documentation

## 2020-03-01 DIAGNOSIS — E7800 Pure hypercholesterolemia, unspecified: Secondary | ICD-10-CM

## 2020-03-01 DIAGNOSIS — N179 Acute kidney failure, unspecified: Secondary | ICD-10-CM | POA: Insufficient documentation

## 2020-03-01 DIAGNOSIS — D631 Anemia in chronic kidney disease: Secondary | ICD-10-CM | POA: Insufficient documentation

## 2020-03-01 LAB — RENAL FUNCTION PANEL
Albumin: 3.5 g/dL (ref 3.5–5.0)
Anion gap: 11 (ref 5–15)
BUN: 71 mg/dL — ABNORMAL HIGH (ref 8–23)
CO2: 19 mmol/L — ABNORMAL LOW (ref 22–32)
Calcium: 9.1 mg/dL (ref 8.9–10.3)
Chloride: 109 mmol/L (ref 98–111)
Creatinine, Ser: 2.87 mg/dL — ABNORMAL HIGH (ref 0.44–1.00)
GFR calc Af Amer: 18 mL/min — ABNORMAL LOW (ref >60)
GFR calc non Af Amer: 15 mL/min — ABNORMAL LOW (ref >60)
Glucose, Bld: 154 mg/dL — ABNORMAL HIGH (ref 70–99)
Phosphorus: 4.4 mg/dL (ref 2.5–4.6)
Potassium: 4.7 mmol/L (ref 3.5–5.1)
Sodium: 139 mmol/L (ref 135–145)

## 2020-03-01 LAB — DIFFERENTIAL
Abs Immature Granulocytes: 0.03 10*3/uL (ref 0.00–0.07)
Basophils Absolute: 0.1 10*3/uL (ref 0.0–0.1)
Basophils Relative: 1 %
Eosinophils Absolute: 0.3 10*3/uL (ref 0.0–0.5)
Eosinophils Relative: 3 %
Immature Granulocytes: 0 %
Lymphocytes Relative: 22 %
Lymphs Abs: 2.1 10*3/uL (ref 0.7–4.0)
Monocytes Absolute: 0.7 10*3/uL (ref 0.1–1.0)
Monocytes Relative: 7 %
Neutro Abs: 6.3 10*3/uL (ref 1.7–7.7)
Neutrophils Relative %: 67 %

## 2020-03-01 LAB — CBC
HCT: 32 % — ABNORMAL LOW (ref 36.0–46.0)
Hemoglobin: 9.7 g/dL — ABNORMAL LOW (ref 12.0–15.0)
MCH: 30.2 pg (ref 26.0–34.0)
MCHC: 30.3 g/dL (ref 30.0–36.0)
MCV: 99.7 fL (ref 80.0–100.0)
Platelets: 158 10*3/uL (ref 150–400)
RBC: 3.21 MIL/uL — ABNORMAL LOW (ref 3.87–5.11)
RDW: 12.5 % (ref 11.5–15.5)
WBC: 9.5 10*3/uL (ref 4.0–10.5)
nRBC: 0 % (ref 0.0–0.2)

## 2020-03-01 LAB — IRON AND TIBC
Iron: 85 ug/dL (ref 28–170)
Saturation Ratios: 32 % — ABNORMAL HIGH (ref 10.4–31.8)
TIBC: 262 ug/dL (ref 250–450)
UIBC: 177 ug/dL

## 2020-03-01 LAB — FERRITIN: Ferritin: 564 ng/mL — ABNORMAL HIGH (ref 11–307)

## 2020-03-01 LAB — MAGNESIUM: Magnesium: 1.6 mg/dL — ABNORMAL LOW (ref 1.7–2.4)

## 2020-03-01 LAB — POCT HEMOGLOBIN-HEMACUE: Hemoglobin: 10 g/dL — ABNORMAL LOW (ref 12.0–15.0)

## 2020-03-01 MED ORDER — EPOETIN ALFA-EPBX 10000 UNIT/ML IJ SOLN
10000.0000 [IU] | INTRAMUSCULAR | Status: DC
  Administered 2020-03-01: 13:00:00 10000 [IU] via SUBCUTANEOUS

## 2020-03-01 MED ORDER — EPOETIN ALFA-EPBX 10000 UNIT/ML IJ SOLN
INTRAMUSCULAR | Status: AC
  Filled 2020-03-01: qty 1

## 2020-03-02 MED ORDER — ATORVASTATIN CALCIUM 40 MG PO TABS: ORAL_TABLET | ORAL | 3 refills | Status: DC

## 2020-03-14 ENCOUNTER — Encounter: Payer: Self-pay | Admitting: Family Medicine

## 2020-03-14 DIAGNOSIS — H43812 Vitreous degeneration, left eye: Secondary | ICD-10-CM | POA: Diagnosis not present

## 2020-03-14 DIAGNOSIS — Z961 Presence of intraocular lens: Secondary | ICD-10-CM | POA: Diagnosis not present

## 2020-03-14 DIAGNOSIS — H3562 Retinal hemorrhage, left eye: Secondary | ICD-10-CM | POA: Diagnosis not present

## 2020-03-14 DIAGNOSIS — H25811 Combined forms of age-related cataract, right eye: Secondary | ICD-10-CM | POA: Diagnosis not present

## 2020-03-14 DIAGNOSIS — Z794 Long term (current) use of insulin: Secondary | ICD-10-CM | POA: Diagnosis not present

## 2020-03-14 DIAGNOSIS — E119 Type 2 diabetes mellitus without complications: Secondary | ICD-10-CM | POA: Diagnosis not present

## 2020-03-14 LAB — HM DIABETES EYE EXAM

## 2020-03-15 ENCOUNTER — Ambulatory Visit (HOSPITAL_COMMUNITY)
Admission: RE | Admit: 2020-03-15 | Discharge: 2020-03-15 | Disposition: A | Discharge status: Home / Self Care | Payer: Medicare HMO | Source: Ambulatory Visit | Attending: Nephrology | Admitting: Nephrology

## 2020-03-15 ENCOUNTER — Other Ambulatory Visit: Payer: Self-pay

## 2020-03-15 VITALS — BP 132/58 | HR 68 | Temp 97.0°F | Resp 20

## 2020-03-15 DIAGNOSIS — N189 Chronic kidney disease, unspecified: Secondary | ICD-10-CM | POA: Diagnosis not present

## 2020-03-15 DIAGNOSIS — N179 Acute kidney failure, unspecified: Secondary | ICD-10-CM | POA: Insufficient documentation

## 2020-03-15 DIAGNOSIS — D631 Anemia in chronic kidney disease: Secondary | ICD-10-CM | POA: Insufficient documentation

## 2020-03-15 LAB — POCT HEMOGLOBIN-HEMACUE: Hemoglobin: 9.5 g/dL — ABNORMAL LOW (ref 12.0–15.0)

## 2020-03-15 MED ORDER — EPOETIN ALFA-EPBX 10000 UNIT/ML IJ SOLN
INTRAMUSCULAR | Status: AC
  Administered 2020-03-15: 10000 [IU] via SUBCUTANEOUS
  Filled 2020-03-15: qty 1

## 2020-03-15 MED ORDER — EPOETIN ALFA-EPBX 10000 UNIT/ML IJ SOLN: 10000.0000 [IU] | INTRAMUSCULAR | Status: DC

## 2020-03-29 ENCOUNTER — Encounter (HOSPITAL_COMMUNITY)
Admission: RE | Admit: 2020-03-29 | Discharge: 2020-03-29 | Disposition: A | Discharge status: Home / Self Care | Payer: Medicare HMO | Source: Ambulatory Visit | Attending: Nephrology | Admitting: Nephrology

## 2020-03-29 ENCOUNTER — Other Ambulatory Visit: Payer: Self-pay

## 2020-03-29 VITALS — BP 131/55 | HR 65 | Temp 96.7°F | Resp 20

## 2020-03-29 DIAGNOSIS — N179 Acute kidney failure, unspecified: Secondary | ICD-10-CM | POA: Diagnosis not present

## 2020-03-29 DIAGNOSIS — N189 Chronic kidney disease, unspecified: Secondary | ICD-10-CM | POA: Insufficient documentation

## 2020-03-29 DIAGNOSIS — D631 Anemia in chronic kidney disease: Secondary | ICD-10-CM | POA: Diagnosis not present

## 2020-03-29 LAB — CBC
HCT: 31.8 % — ABNORMAL LOW (ref 36.0–46.0)
Hemoglobin: 9.7 g/dL — ABNORMAL LOW (ref 12.0–15.0)
MCH: 30.4 pg (ref 26.0–34.0)
MCHC: 30.5 g/dL (ref 30.0–36.0)
MCV: 99.7 fL (ref 80.0–100.0)
Platelets: 156 10*3/uL (ref 150–400)
RBC: 3.19 MIL/uL — ABNORMAL LOW (ref 3.87–5.11)
RDW: 12.4 % (ref 11.5–15.5)
WBC: 9.5 10*3/uL (ref 4.0–10.5)
nRBC: 0 % (ref 0.0–0.2)

## 2020-03-29 LAB — DIFFERENTIAL
Abs Immature Granulocytes: 0.04 10*3/uL (ref 0.00–0.07)
Basophils Absolute: 0.1 10*3/uL (ref 0.0–0.1)
Basophils Relative: 1 %
Eosinophils Absolute: 0.3 10*3/uL (ref 0.0–0.5)
Eosinophils Relative: 3 %
Immature Granulocytes: 0 %
Lymphocytes Relative: 22 %
Lymphs Abs: 2.1 10*3/uL (ref 0.7–4.0)
Monocytes Absolute: 0.7 10*3/uL (ref 0.1–1.0)
Monocytes Relative: 7 %
Neutro Abs: 6.3 10*3/uL (ref 1.7–7.7)
Neutrophils Relative %: 67 %

## 2020-03-29 LAB — RENAL FUNCTION PANEL
Albumin: 3.5 g/dL (ref 3.5–5.0)
Anion gap: 14 (ref 5–15)
BUN: 76 mg/dL — ABNORMAL HIGH (ref 8–23)
CO2: 19 mmol/L — ABNORMAL LOW (ref 22–32)
Calcium: 9.1 mg/dL (ref 8.9–10.3)
Chloride: 106 mmol/L (ref 98–111)
Creatinine, Ser: 3.21 mg/dL — ABNORMAL HIGH (ref 0.44–1.00)
GFR calc Af Amer: 15 mL/min — ABNORMAL LOW (ref >60)
GFR calc non Af Amer: 13 mL/min — ABNORMAL LOW (ref >60)
Glucose, Bld: 146 mg/dL — ABNORMAL HIGH (ref 70–99)
Phosphorus: 4.7 mg/dL — ABNORMAL HIGH (ref 2.5–4.6)
Potassium: 4.7 mmol/L (ref 3.5–5.1)
Sodium: 139 mmol/L (ref 135–145)

## 2020-03-29 LAB — IRON AND TIBC
Iron: 84 ug/dL (ref 28–170)
Saturation Ratios: 33 % — ABNORMAL HIGH (ref 10.4–31.8)
TIBC: 258 ug/dL (ref 250–450)
UIBC: 174 ug/dL

## 2020-03-29 LAB — POCT HEMOGLOBIN-HEMACUE: Hemoglobin: 9.8 g/dL — ABNORMAL LOW (ref 12.0–15.0)

## 2020-03-29 LAB — FERRITIN: Ferritin: 483 ng/mL — ABNORMAL HIGH (ref 11–307)

## 2020-03-29 LAB — MAGNESIUM: Magnesium: 1.7 mg/dL (ref 1.7–2.4)

## 2020-03-29 MED ORDER — EPOETIN ALFA-EPBX 10000 UNIT/ML IJ SOLN
INTRAMUSCULAR | Status: AC
  Administered 2020-03-29: 10000 [IU]
  Filled 2020-03-29: qty 1

## 2020-03-29 MED ORDER — EPOETIN ALFA-EPBX 10000 UNIT/ML IJ SOLN: 10000.0000 [IU] | INTRAMUSCULAR | Status: DC

## 2020-03-30 DIAGNOSIS — I129 Hypertensive chronic kidney disease with stage 1 through stage 4 chronic kidney disease, or unspecified chronic kidney disease: Secondary | ICD-10-CM | POA: Diagnosis not present

## 2020-03-30 DIAGNOSIS — N184 Chronic kidney disease, stage 4 (severe): Secondary | ICD-10-CM | POA: Diagnosis not present

## 2020-03-30 DIAGNOSIS — N2581 Secondary hyperparathyroidism of renal origin: Secondary | ICD-10-CM | POA: Diagnosis not present

## 2020-03-30 DIAGNOSIS — D631 Anemia in chronic kidney disease: Secondary | ICD-10-CM | POA: Diagnosis not present

## 2020-04-07 ENCOUNTER — Other Ambulatory Visit: Payer: Self-pay

## 2020-04-07 ENCOUNTER — Ambulatory Visit (INDEPENDENT_AMBULATORY_CARE_PROVIDER_SITE_OTHER): Payer: Medicare HMO | Admitting: Family Medicine

## 2020-04-07 VITALS — BP 162/64 | HR 74 | Wt 189.2 lb

## 2020-04-07 DIAGNOSIS — E1122 Type 2 diabetes mellitus with diabetic chronic kidney disease: Secondary | ICD-10-CM

## 2020-04-07 DIAGNOSIS — R300 Dysuria: Secondary | ICD-10-CM

## 2020-04-07 DIAGNOSIS — N3000 Acute cystitis without hematuria: Secondary | ICD-10-CM | POA: Diagnosis not present

## 2020-04-07 DIAGNOSIS — Z794 Long term (current) use of insulin: Secondary | ICD-10-CM

## 2020-04-07 HISTORY — DX: Acute cystitis without hematuria: N30.00

## 2020-04-07 LAB — POCT URINALYSIS DIP (MANUAL ENTRY)
Bilirubin, UA: NEGATIVE
Blood, UA: NEGATIVE
Glucose, UA: NEGATIVE mg/dL
Ketones, POC UA: NEGATIVE mg/dL
Nitrite, UA: NEGATIVE
Protein Ur, POC: 30 mg/dL — AB
Spec Grav, UA: 1.01 (ref 1.010–1.025)
Urobilinogen, UA: 0.2 E.U./dL
pH, UA: 5.5 (ref 5.0–8.0)

## 2020-04-07 LAB — GLUCOSE, POCT (MANUAL RESULT ENTRY): POC Glucose: 130 mg/dl — AB (ref 70–99)

## 2020-04-07 LAB — POCT UA - MICROSCOPIC ONLY: Epithelial cells, urine per micros: >20

## 2020-04-07 LAB — POCT GLYCOSYLATED HEMOGLOBIN (HGB A1C): HbA1c, POC (controlled diabetic range): 5.8 % (ref 0.0–7.0)

## 2020-04-07 MED ORDER — CEPHALEXIN 500 MG PO CAPS: 500.0000 mg | ORAL_CAPSULE | Freq: Every day | ORAL | 0 refills | Status: AC

## 2020-04-07 NOTE — Patient Instructions (Addendum)
Thank you for coming to see me today. It was a pleasure. Today we talked about:   We will treat you for a UTI.  Take keflex once a day for 5 days.  If you don't have improvement, please come back.  If you have any questions or concerns, please do not hesitate to call the office at [phone].  Please follow up with your PCP in the next month and bring all your medications with you.  Best,   [place] Constable, DO

## 2020-04-07 NOTE — Assessment & Plan Note (Signed)
A1c today 5.8.  CBG 130.  Reports taking Lantus 28 units daily.  Advised patient to return within a month to see her PCP for follow-up.  Also advised for her to bring her other medications with her given it seems that our medications are not consistent with what she is taking, as she reports she is on spironolactone, but this is not on her medication list.

## 2020-04-07 NOTE — Progress Notes (Signed)
SUBJECTIVE:   CHIEF COMPLAINT / HPI:   Increased Urination  Polyuria for about 1 month Denies dysuria Has been on Lasix 40 and Spironolactone Last seen by nephrologist, Dr. Posey Pronto, last week Told everything was fine, didn't tell them that she was having increased urination Hasn't been checking sugar  Takes Lantus 28u daily Endorses polydipsia as well for the last 3-4 months Any antibiotics in the last 30 days: no  Symptoms Urgency: no Frequency: yes Blood in urine: no Pain in back:no Fever: no Vaginal discharge: no Mouth Ulcers: no  PERTINENT  PMH / PSH: HTN, T2DM, anemia chronic kidney disease, CKD 4  OBJECTIVE:   BP (!) 162/64   Pulse 74   Wt 189 lb 3.2 oz (85.8 kg)   LMP  (LMP Unknown)   SpO2 99%   BMI 33.52 kg/m    Physical Exam:  General: 74 y.o. female in NAD Cardio: RRR Lungs: CTAB, no wheezing, no rhonchi, no crackles, no IWOB on RA Abdomen: Soft, non-tender to palpation, non-distended Skin: warm and dry  Results for orders placed or performed in visit on 04/07/20 (from the past 24 hour(s))  POCT urinalysis dipstick     Status: Abnormal   Collection Time: 04/07/20  1:40 PM  Result Value Ref Range   Color, UA yellow yellow   Clarity, UA cloudy (A) clear   Glucose, UA negative negative mg/dL   Bilirubin, UA negative negative   Ketones, POC UA negative negative mg/dL   Spec Grav, UA 1.010 1.010 - 1.025   Blood, UA negative negative   pH, UA 5.5 5.0 - 8.0   Protein Ur, POC =30 (A) negative mg/dL   Urobilinogen, UA 0.2 0.2 or 1.0 E.U./dL   Nitrite, UA Negative Negative   Leukocytes, UA Moderate (2+) (A) Negative  POCT UA - Microscopic Only     Status: Abnormal   Collection Time: 04/07/20  1:40 PM  Result Value Ref Range   WBC, Ur, HPF, POC 5-10    RBC, urine, microscopic NONE    Bacteria, U Microscopic MANY    Epithelial cells, urine per micros >20   HgB A1c     Status: None   Collection Time: 04/07/20  1:55 PM  Result Value Ref Range   Hemoglobin A1C     HbA1c POC (<> result, manual entry)     HbA1c, POC (prediabetic range)     HbA1c, POC (controlled diabetic range) 5.8 0.0 - 7.0 %  Glucose (CBG)     Status: Abnormal   Collection Time: 04/07/20  1:55 PM  Result Value Ref Range   POC Glucose 130 (A) 70 - 99 mg/dl     ASSESSMENT/PLAN:   Acute cystitis without hematuria UA with 30 protein, neg nitrite, mod leuks, neg blood.  Many bacteria on microscopy.  Will send for culture.  Point-of-care CBG 130, hemoglobin A1c 5.8.  Do not suspect that this is contributing to patient's symptoms.  Patient's blood work also checked last week by nephrology, no significant changes.  Most likely the patient has a UTI.  Will treat with renally adjusted Keflex at 500 mg daily x5 days.  Patient given return precautions including worsening of symptoms, no resolution of symptoms, blood in urine, fever, CVA pain.  DM2 (diabetes mellitus, type 2) ([facility]) A1c today 5.8.  CBG 130.  Reports taking Lantus 28 units daily.  Advised patient to return within a month to see her PCP for follow-up.  Also advised for her to bring her other  medications with her given it seems that our medications are not consistent with what she is taking, as she reports she is on spironolactone, but this is not on her medication list.     Cleophas Dunker, [place]

## 2020-04-07 NOTE — Assessment & Plan Note (Addendum)
UA with 30 protein, neg nitrite, mod leuks, neg blood.  Many bacteria on microscopy.  Will send for culture.  Point-of-care CBG 130, hemoglobin A1c 5.8.  Do not suspect that this is contributing to patient's symptoms.  Patient's blood work also checked last week by nephrology, no significant changes.  Most likely the patient has a UTI.  Will treat with renally adjusted Keflex at 500 mg daily x5 days.  Patient given return precautions including worsening of symptoms, no resolution of symptoms, blood in urine, fever, CVA pain.

## 2020-04-09 LAB — URINE CULTURE

## 2020-04-12 ENCOUNTER — Other Ambulatory Visit: Payer: Self-pay

## 2020-04-12 ENCOUNTER — Ambulatory Visit (HOSPITAL_COMMUNITY)
Admission: RE | Admit: 2020-04-12 | Discharge: 2020-04-12 | Disposition: A | Discharge status: Home / Self Care | Payer: Medicare HMO | Source: Ambulatory Visit | Attending: Nephrology | Admitting: Nephrology

## 2020-04-12 VITALS — BP 162/56 | HR 64 | Temp 96.9°F | Resp 20

## 2020-04-12 DIAGNOSIS — N189 Chronic kidney disease, unspecified: Secondary | ICD-10-CM | POA: Insufficient documentation

## 2020-04-12 DIAGNOSIS — D631 Anemia in chronic kidney disease: Secondary | ICD-10-CM | POA: Diagnosis not present

## 2020-04-12 DIAGNOSIS — N179 Acute kidney failure, unspecified: Secondary | ICD-10-CM | POA: Insufficient documentation

## 2020-04-12 LAB — POCT HEMOGLOBIN-HEMACUE: Hemoglobin: 9.9 g/dL — ABNORMAL LOW (ref 12.0–15.0)

## 2020-04-12 MED ORDER — EPOETIN ALFA-EPBX 10000 UNIT/ML IJ SOLN
INTRAMUSCULAR | Status: AC
  Filled 2020-04-12: qty 1

## 2020-04-12 MED ORDER — EPOETIN ALFA-EPBX 10000 UNIT/ML IJ SOLN
10000.0000 [IU] | INTRAMUSCULAR | Status: DC
  Administered 2020-04-12: 10000 [IU] via SUBCUTANEOUS

## 2020-04-26 ENCOUNTER — Encounter (HOSPITAL_COMMUNITY)
Admission: RE | Admit: 2020-04-26 | Discharge: 2020-04-26 | Disposition: A | Discharge status: Home / Self Care | Payer: Medicare HMO | Source: Ambulatory Visit | Attending: Nephrology | Admitting: Nephrology

## 2020-04-26 ENCOUNTER — Other Ambulatory Visit: Payer: Self-pay

## 2020-04-26 VITALS — BP 144/57 | HR 72 | Temp 96.3°F | Resp 20

## 2020-04-26 DIAGNOSIS — N189 Chronic kidney disease, unspecified: Secondary | ICD-10-CM | POA: Insufficient documentation

## 2020-04-26 DIAGNOSIS — N179 Acute kidney failure, unspecified: Secondary | ICD-10-CM | POA: Diagnosis not present

## 2020-04-26 DIAGNOSIS — D631 Anemia in chronic kidney disease: Secondary | ICD-10-CM | POA: Insufficient documentation

## 2020-04-26 LAB — FERRITIN: Ferritin: 453 ng/mL — ABNORMAL HIGH (ref 11–307)

## 2020-04-26 LAB — DIFFERENTIAL
Abs Immature Granulocytes: 0.04 10*3/uL (ref 0.00–0.07)
Basophils Absolute: 0 10*3/uL (ref 0.0–0.1)
Basophils Relative: 1 %
Eosinophils Absolute: 0.3 10*3/uL (ref 0.0–0.5)
Eosinophils Relative: 3 %
Immature Granulocytes: 1 %
Lymphocytes Relative: 21 %
Lymphs Abs: 1.9 10*3/uL (ref 0.7–4.0)
Monocytes Absolute: 0.5 10*3/uL (ref 0.1–1.0)
Monocytes Relative: 6 %
Neutro Abs: 6.1 10*3/uL (ref 1.7–7.7)
Neutrophils Relative %: 68 %

## 2020-04-26 LAB — CBC
HCT: 32.5 % — ABNORMAL LOW (ref 36.0–46.0)
Hemoglobin: 10.2 g/dL — ABNORMAL LOW (ref 12.0–15.0)
MCH: 31.1 pg (ref 26.0–34.0)
MCHC: 31.4 g/dL (ref 30.0–36.0)
MCV: 99.1 fL (ref 80.0–100.0)
Platelets: 155 10*3/uL (ref 150–400)
RBC: 3.28 MIL/uL — ABNORMAL LOW (ref 3.87–5.11)
RDW: 12.7 % (ref 11.5–15.5)
WBC: 8.8 10*3/uL (ref 4.0–10.5)
nRBC: 0 % (ref 0.0–0.2)

## 2020-04-26 LAB — RENAL FUNCTION PANEL
Albumin: 3.6 g/dL (ref 3.5–5.0)
Anion gap: 10 (ref 5–15)
BUN: 80 mg/dL — ABNORMAL HIGH (ref 8–23)
CO2: 19 mmol/L — ABNORMAL LOW (ref 22–32)
Calcium: 9.2 mg/dL (ref 8.9–10.3)
Chloride: 112 mmol/L — ABNORMAL HIGH (ref 98–111)
Creatinine, Ser: 3.21 mg/dL — ABNORMAL HIGH (ref 0.44–1.00)
GFR calc Af Amer: 15 mL/min — ABNORMAL LOW (ref >60)
GFR calc non Af Amer: 13 mL/min — ABNORMAL LOW (ref >60)
Glucose, Bld: 197 mg/dL — ABNORMAL HIGH (ref 70–99)
Phosphorus: 4.5 mg/dL (ref 2.5–4.6)
Potassium: 4.7 mmol/L (ref 3.5–5.1)
Sodium: 141 mmol/L (ref 135–145)

## 2020-04-26 LAB — IRON AND TIBC
Iron: 92 ug/dL (ref 28–170)
Saturation Ratios: 34 % — ABNORMAL HIGH (ref 10.4–31.8)
TIBC: 270 ug/dL (ref 250–450)
UIBC: 178 ug/dL

## 2020-04-26 LAB — MAGNESIUM: Magnesium: 1.7 mg/dL (ref 1.7–2.4)

## 2020-04-26 LAB — POCT HEMOGLOBIN-HEMACUE: Hemoglobin: 10.3 g/dL — ABNORMAL LOW (ref 12.0–15.0)

## 2020-04-26 MED ORDER — EPOETIN ALFA-EPBX 10000 UNIT/ML IJ SOLN: 10000.0000 [IU] | INTRAMUSCULAR | Status: DC

## 2020-04-26 MED ORDER — EPOETIN ALFA-EPBX 10000 UNIT/ML IJ SOLN
INTRAMUSCULAR | Status: AC
  Administered 2020-04-26: 10000 [IU] via SUBCUTANEOUS
  Filled 2020-04-26: qty 1

## 2020-05-10 ENCOUNTER — Other Ambulatory Visit: Payer: Self-pay

## 2020-05-10 ENCOUNTER — Ambulatory Visit (HOSPITAL_COMMUNITY)
Admission: RE | Admit: 2020-05-10 | Discharge: 2020-05-10 | Disposition: A | Discharge status: Home / Self Care | Payer: Medicare HMO | Source: Ambulatory Visit | Attending: Nephrology | Admitting: Nephrology

## 2020-05-10 VITALS — BP 128/46

## 2020-05-10 DIAGNOSIS — D631 Anemia in chronic kidney disease: Secondary | ICD-10-CM | POA: Diagnosis not present

## 2020-05-10 DIAGNOSIS — N189 Chronic kidney disease, unspecified: Secondary | ICD-10-CM | POA: Insufficient documentation

## 2020-05-10 DIAGNOSIS — N179 Acute kidney failure, unspecified: Secondary | ICD-10-CM | POA: Insufficient documentation

## 2020-05-10 LAB — POCT HEMOGLOBIN-HEMACUE: Hemoglobin: 10.4 g/dL — ABNORMAL LOW (ref 12.0–15.0)

## 2020-05-10 MED ORDER — EPOETIN ALFA-EPBX 10000 UNIT/ML IJ SOLN: 10000.0000 [IU] | INTRAMUSCULAR | Status: DC

## 2020-05-10 MED ORDER — EPOETIN ALFA-EPBX 10000 UNIT/ML IJ SOLN
INTRAMUSCULAR | Status: AC
  Administered 2020-05-10: 10000 [IU]
  Filled 2020-05-10: qty 1

## 2020-05-24 ENCOUNTER — Encounter (HOSPITAL_COMMUNITY)
Admission: RE | Admit: 2020-05-24 | Discharge: 2020-05-24 | Disposition: A | Discharge status: Home / Self Care | Payer: Medicare HMO | Source: Ambulatory Visit | Attending: Nephrology | Admitting: Nephrology

## 2020-05-24 ENCOUNTER — Other Ambulatory Visit: Payer: Self-pay

## 2020-05-24 VITALS — BP 157/52 | HR 64 | Temp 97.3°F

## 2020-05-24 DIAGNOSIS — D631 Anemia in chronic kidney disease: Secondary | ICD-10-CM

## 2020-05-24 DIAGNOSIS — N189 Chronic kidney disease, unspecified: Secondary | ICD-10-CM

## 2020-05-24 DIAGNOSIS — N179 Acute kidney failure, unspecified: Secondary | ICD-10-CM

## 2020-05-24 LAB — POCT HEMOGLOBIN-HEMACUE: Hemoglobin: 10.1 g/dL — ABNORMAL LOW (ref 12.0–15.0)

## 2020-05-24 MED ORDER — EPOETIN ALFA-EPBX 10000 UNIT/ML IJ SOLN: 10000.0000 [IU] | INTRAMUSCULAR | Status: DC

## 2020-05-24 MED ORDER — EPOETIN ALFA-EPBX 10000 UNIT/ML IJ SOLN
INTRAMUSCULAR | Status: AC
  Administered 2020-05-24: 10000 [IU] via SUBCUTANEOUS
  Filled 2020-05-24: qty 1

## 2020-06-07 ENCOUNTER — Other Ambulatory Visit: Payer: Self-pay

## 2020-06-07 ENCOUNTER — Ambulatory Visit (HOSPITAL_COMMUNITY)
Admission: RE | Admit: 2020-06-07 | Discharge: 2020-06-07 | Disposition: A | Discharge status: Home / Self Care | Payer: Medicare HMO | Source: Ambulatory Visit | Attending: Nephrology | Admitting: Nephrology

## 2020-06-07 VITALS — BP 133/49 | HR 74 | Temp 97.4°F | Resp 20

## 2020-06-07 DIAGNOSIS — D631 Anemia in chronic kidney disease: Secondary | ICD-10-CM | POA: Insufficient documentation

## 2020-06-07 DIAGNOSIS — N189 Chronic kidney disease, unspecified: Secondary | ICD-10-CM | POA: Insufficient documentation

## 2020-06-07 DIAGNOSIS — N179 Acute kidney failure, unspecified: Secondary | ICD-10-CM | POA: Diagnosis not present

## 2020-06-07 LAB — DIFFERENTIAL
Abs Immature Granulocytes: 0.08 10*3/uL — ABNORMAL HIGH (ref 0.00–0.07)
Basophils Absolute: 0 10*3/uL (ref 0.0–0.1)
Basophils Relative: 0 %
Eosinophils Absolute: 0.2 10*3/uL (ref 0.0–0.5)
Eosinophils Relative: 2 %
Immature Granulocytes: 1 %
Lymphocytes Relative: 20 %
Lymphs Abs: 1.9 10*3/uL (ref 0.7–4.0)
Monocytes Absolute: 0.6 10*3/uL (ref 0.1–1.0)
Monocytes Relative: 6 %
Neutro Abs: 6.7 10*3/uL (ref 1.7–7.7)
Neutrophils Relative %: 71 %

## 2020-06-07 LAB — RENAL FUNCTION PANEL
Albumin: 3.5 g/dL (ref 3.5–5.0)
Anion gap: 11 (ref 5–15)
BUN: 86 mg/dL — ABNORMAL HIGH (ref 8–23)
CO2: 17 mmol/L — ABNORMAL LOW (ref 22–32)
Calcium: 9.2 mg/dL (ref 8.9–10.3)
Chloride: 108 mmol/L (ref 98–111)
Creatinine, Ser: 3.25 mg/dL — ABNORMAL HIGH (ref 0.44–1.00)
GFR calc Af Amer: 15 mL/min — ABNORMAL LOW (ref >60)
GFR calc non Af Amer: 13 mL/min — ABNORMAL LOW (ref >60)
Glucose, Bld: 208 mg/dL — ABNORMAL HIGH (ref 70–99)
Phosphorus: 4.6 mg/dL (ref 2.5–4.6)
Potassium: 5 mmol/L (ref 3.5–5.1)
Sodium: 136 mmol/L (ref 135–145)

## 2020-06-07 LAB — CBC
HCT: 31.1 % — ABNORMAL LOW (ref 36.0–46.0)
Hemoglobin: 9.4 g/dL — ABNORMAL LOW (ref 12.0–15.0)
MCH: 29.7 pg (ref 26.0–34.0)
MCHC: 30.2 g/dL (ref 30.0–36.0)
MCV: 98.1 fL (ref 80.0–100.0)
Platelets: 139 10*3/uL — ABNORMAL LOW (ref 150–400)
RBC: 3.17 MIL/uL — ABNORMAL LOW (ref 3.87–5.11)
RDW: 12.5 % (ref 11.5–15.5)
WBC: 9.6 10*3/uL (ref 4.0–10.5)
nRBC: 0 % (ref 0.0–0.2)

## 2020-06-07 LAB — IRON AND TIBC
Iron: 65 ug/dL (ref 28–170)
Saturation Ratios: 26 % (ref 10.4–31.8)
TIBC: 251 ug/dL (ref 250–450)
UIBC: 186 ug/dL

## 2020-06-07 LAB — POCT HEMOGLOBIN-HEMACUE: Hemoglobin: 10.1 g/dL — ABNORMAL LOW (ref 12.0–15.0)

## 2020-06-07 LAB — MAGNESIUM: Magnesium: 1.6 mg/dL — ABNORMAL LOW (ref 1.7–2.4)

## 2020-06-07 LAB — FERRITIN: Ferritin: 488 ng/mL — ABNORMAL HIGH (ref 11–307)

## 2020-06-07 MED ORDER — EPOETIN ALFA-EPBX 10000 UNIT/ML IJ SOLN
INTRAMUSCULAR | Status: AC
  Filled 2020-06-07: qty 1

## 2020-06-07 MED ORDER — EPOETIN ALFA-EPBX 10000 UNIT/ML IJ SOLN
10000.0000 [IU] | INTRAMUSCULAR | Status: DC
  Administered 2020-06-07: 10000 [IU] via SUBCUTANEOUS

## 2020-06-12 ENCOUNTER — Other Ambulatory Visit: Payer: Self-pay | Admitting: Family Medicine

## 2020-06-21 ENCOUNTER — Encounter (HOSPITAL_COMMUNITY)
Admission: RE | Admit: 2020-06-21 | Discharge: 2020-06-21 | Disposition: A | Discharge status: Home / Self Care | Payer: Medicare HMO | Source: Ambulatory Visit | Attending: Nephrology | Admitting: Nephrology

## 2020-06-21 ENCOUNTER — Other Ambulatory Visit: Payer: Self-pay

## 2020-06-21 VITALS — BP 156/62 | HR 66 | Temp 97.5°F | Resp 20

## 2020-06-21 DIAGNOSIS — N179 Acute kidney failure, unspecified: Secondary | ICD-10-CM | POA: Diagnosis not present

## 2020-06-21 DIAGNOSIS — D631 Anemia in chronic kidney disease: Secondary | ICD-10-CM | POA: Insufficient documentation

## 2020-06-21 DIAGNOSIS — N189 Chronic kidney disease, unspecified: Secondary | ICD-10-CM | POA: Insufficient documentation

## 2020-06-21 LAB — POCT HEMOGLOBIN-HEMACUE: Hemoglobin: 9.8 g/dL — ABNORMAL LOW (ref 12.0–15.0)

## 2020-06-21 MED ORDER — EPOETIN ALFA-EPBX 10000 UNIT/ML IJ SOLN
INTRAMUSCULAR | Status: AC
  Administered 2020-06-21: 10000 [IU] via SUBCUTANEOUS
  Filled 2020-06-21: qty 1

## 2020-06-21 MED ORDER — EPOETIN ALFA-EPBX 10000 UNIT/ML IJ SOLN: 10000.0000 [IU] | INTRAMUSCULAR | Status: DC

## 2020-07-05 ENCOUNTER — Other Ambulatory Visit: Payer: Self-pay

## 2020-07-05 ENCOUNTER — Encounter (HOSPITAL_COMMUNITY)
Admission: RE | Admit: 2020-07-05 | Discharge: 2020-07-05 | Disposition: A | Discharge status: Home / Self Care | Payer: Medicare HMO | Source: Ambulatory Visit | Attending: Nephrology | Admitting: Nephrology

## 2020-07-05 VITALS — BP 128/52 | HR 70 | Temp 98.0°F | Resp 20

## 2020-07-05 DIAGNOSIS — N189 Chronic kidney disease, unspecified: Secondary | ICD-10-CM | POA: Insufficient documentation

## 2020-07-05 DIAGNOSIS — D631 Anemia in chronic kidney disease: Secondary | ICD-10-CM | POA: Diagnosis not present

## 2020-07-05 DIAGNOSIS — N179 Acute kidney failure, unspecified: Secondary | ICD-10-CM | POA: Insufficient documentation

## 2020-07-05 LAB — DIFFERENTIAL
Abs Immature Granulocytes: 0.04 10*3/uL (ref 0.00–0.07)
Basophils Absolute: 0 10*3/uL (ref 0.0–0.1)
Basophils Relative: 0 %
Eosinophils Absolute: 0.2 10*3/uL (ref 0.0–0.5)
Eosinophils Relative: 2 %
Immature Granulocytes: 0 %
Lymphocytes Relative: 19 %
Lymphs Abs: 1.9 10*3/uL (ref 0.7–4.0)
Monocytes Absolute: 0.5 10*3/uL (ref 0.1–1.0)
Monocytes Relative: 6 %
Neutro Abs: 7.1 10*3/uL (ref 1.7–7.7)
Neutrophils Relative %: 73 %

## 2020-07-05 LAB — IRON AND TIBC
Iron: 81 ug/dL (ref 28–170)
Saturation Ratios: 32 % — ABNORMAL HIGH (ref 10.4–31.8)
TIBC: 256 ug/dL (ref 250–450)
UIBC: 175 ug/dL

## 2020-07-05 LAB — CBC
HCT: 31.3 % — ABNORMAL LOW (ref 36.0–46.0)
Hemoglobin: 9.4 g/dL — ABNORMAL LOW (ref 12.0–15.0)
MCH: 29.5 pg (ref 26.0–34.0)
MCHC: 30 g/dL (ref 30.0–36.0)
MCV: 98.1 fL (ref 80.0–100.0)
Platelets: 154 10*3/uL (ref 150–400)
RBC: 3.19 MIL/uL — ABNORMAL LOW (ref 3.87–5.11)
RDW: 12.8 % (ref 11.5–15.5)
WBC: 9.7 10*3/uL (ref 4.0–10.5)
nRBC: 0 % (ref 0.0–0.2)

## 2020-07-05 LAB — RENAL FUNCTION PANEL
Albumin: 3.6 g/dL (ref 3.5–5.0)
Anion gap: 11 (ref 5–15)
BUN: 93 mg/dL — ABNORMAL HIGH (ref 8–23)
CO2: 17 mmol/L — ABNORMAL LOW (ref 22–32)
Calcium: 9.4 mg/dL (ref 8.9–10.3)
Chloride: 111 mmol/L (ref 98–111)
Creatinine, Ser: 3.37 mg/dL — ABNORMAL HIGH (ref 0.44–1.00)
GFR calc Af Amer: 15 mL/min — ABNORMAL LOW (ref >60)
GFR calc non Af Amer: 13 mL/min — ABNORMAL LOW (ref >60)
Glucose, Bld: 185 mg/dL — ABNORMAL HIGH (ref 70–99)
Phosphorus: 4.9 mg/dL — ABNORMAL HIGH (ref 2.5–4.6)
Potassium: 4.9 mmol/L (ref 3.5–5.1)
Sodium: 139 mmol/L (ref 135–145)

## 2020-07-05 LAB — MAGNESIUM: Magnesium: 1.7 mg/dL (ref 1.7–2.4)

## 2020-07-05 LAB — FERRITIN: Ferritin: 472 ng/mL — ABNORMAL HIGH (ref 11–307)

## 2020-07-05 LAB — POCT HEMOGLOBIN-HEMACUE: Hemoglobin: 9.6 g/dL — ABNORMAL LOW (ref 12.0–15.0)

## 2020-07-05 MED ORDER — EPOETIN ALFA-EPBX 10000 UNIT/ML IJ SOLN
10000.0000 [IU] | INTRAMUSCULAR | Status: DC
  Administered 2020-07-05: 10000 [IU] via SUBCUTANEOUS

## 2020-07-05 MED ORDER — EPOETIN ALFA-EPBX 10000 UNIT/ML IJ SOLN
INTRAMUSCULAR | Status: AC
  Filled 2020-07-05: qty 1

## 2020-07-18 DIAGNOSIS — D631 Anemia in chronic kidney disease: Secondary | ICD-10-CM | POA: Diagnosis not present

## 2020-07-18 DIAGNOSIS — I129 Hypertensive chronic kidney disease with stage 1 through stage 4 chronic kidney disease, or unspecified chronic kidney disease: Secondary | ICD-10-CM | POA: Diagnosis not present

## 2020-07-18 DIAGNOSIS — N2581 Secondary hyperparathyroidism of renal origin: Secondary | ICD-10-CM | POA: Diagnosis not present

## 2020-07-18 DIAGNOSIS — N184 Chronic kidney disease, stage 4 (severe): Secondary | ICD-10-CM | POA: Diagnosis not present

## 2020-07-19 ENCOUNTER — Encounter (HOSPITAL_COMMUNITY)
Admission: RE | Admit: 2020-07-19 | Discharge: 2020-07-19 | Disposition: A | Discharge status: Home / Self Care | Payer: Medicare HMO | Source: Ambulatory Visit | Attending: Nephrology | Admitting: Nephrology

## 2020-07-19 ENCOUNTER — Other Ambulatory Visit: Payer: Self-pay

## 2020-07-19 VITALS — BP 123/49 | HR 73 | Temp 97.7°F | Resp 20

## 2020-07-19 DIAGNOSIS — D631 Anemia in chronic kidney disease: Secondary | ICD-10-CM | POA: Insufficient documentation

## 2020-07-19 DIAGNOSIS — N189 Chronic kidney disease, unspecified: Secondary | ICD-10-CM | POA: Insufficient documentation

## 2020-07-19 DIAGNOSIS — N179 Acute kidney failure, unspecified: Secondary | ICD-10-CM | POA: Diagnosis not present

## 2020-07-19 LAB — POCT HEMOGLOBIN-HEMACUE: Hemoglobin: 9.9 g/dL — ABNORMAL LOW (ref 12.0–15.0)

## 2020-07-19 MED ORDER — EPOETIN ALFA-EPBX 10000 UNIT/ML IJ SOLN
INTRAMUSCULAR | Status: AC
  Administered 2020-07-19: 10000 [IU]
  Filled 2020-07-19: qty 1

## 2020-07-19 MED ORDER — EPOETIN ALFA-EPBX 10000 UNIT/ML IJ SOLN: 10000.0000 [IU] | INTRAMUSCULAR | Status: DC

## 2020-08-02 ENCOUNTER — Other Ambulatory Visit: Payer: Self-pay

## 2020-08-02 ENCOUNTER — Encounter (HOSPITAL_COMMUNITY)
Admission: RE | Admit: 2020-08-02 | Discharge: 2020-08-02 | Disposition: A | Discharge status: Home / Self Care | Payer: Medicare HMO | Source: Ambulatory Visit | Attending: Nephrology | Admitting: Nephrology

## 2020-08-02 VITALS — BP 149/54 | HR 65 | Temp 97.5°F | Resp 20

## 2020-08-02 DIAGNOSIS — N189 Chronic kidney disease, unspecified: Secondary | ICD-10-CM | POA: Diagnosis not present

## 2020-08-02 DIAGNOSIS — N179 Acute kidney failure, unspecified: Secondary | ICD-10-CM | POA: Insufficient documentation

## 2020-08-02 DIAGNOSIS — D631 Anemia in chronic kidney disease: Secondary | ICD-10-CM | POA: Insufficient documentation

## 2020-08-02 LAB — RENAL FUNCTION PANEL
Albumin: 3.6 g/dL (ref 3.5–5.0)
Anion gap: 11 (ref 5–15)
BUN: 70 mg/dL — ABNORMAL HIGH (ref 8–23)
CO2: 20 mmol/L — ABNORMAL LOW (ref 22–32)
Calcium: 9.3 mg/dL (ref 8.9–10.3)
Chloride: 107 mmol/L (ref 98–111)
Creatinine, Ser: 3.22 mg/dL — ABNORMAL HIGH (ref 0.44–1.00)
GFR calc Af Amer: 15 mL/min — ABNORMAL LOW (ref >60)
GFR calc non Af Amer: 13 mL/min — ABNORMAL LOW (ref >60)
Glucose, Bld: 134 mg/dL — ABNORMAL HIGH (ref 70–99)
Phosphorus: 4.5 mg/dL (ref 2.5–4.6)
Potassium: 5.1 mmol/L (ref 3.5–5.1)
Sodium: 138 mmol/L (ref 135–145)

## 2020-08-02 LAB — DIFFERENTIAL
Abs Immature Granulocytes: 0.05 10*3/uL (ref 0.00–0.07)
Basophils Absolute: 0.1 10*3/uL (ref 0.0–0.1)
Basophils Relative: 1 %
Eosinophils Absolute: 0.2 10*3/uL (ref 0.0–0.5)
Eosinophils Relative: 2 %
Immature Granulocytes: 1 %
Lymphocytes Relative: 18 %
Lymphs Abs: 1.9 10*3/uL (ref 0.7–4.0)
Monocytes Absolute: 0.7 10*3/uL (ref 0.1–1.0)
Monocytes Relative: 7 %
Neutro Abs: 7.8 10*3/uL — ABNORMAL HIGH (ref 1.7–7.7)
Neutrophils Relative %: 71 %

## 2020-08-02 LAB — CBC
HCT: 32.2 % — ABNORMAL LOW (ref 36.0–46.0)
Hemoglobin: 9.8 g/dL — ABNORMAL LOW (ref 12.0–15.0)
MCH: 30.1 pg (ref 26.0–34.0)
MCHC: 30.4 g/dL (ref 30.0–36.0)
MCV: 98.8 fL (ref 80.0–100.0)
Platelets: 167 10*3/uL (ref 150–400)
RBC: 3.26 MIL/uL — ABNORMAL LOW (ref 3.87–5.11)
RDW: 12.4 % (ref 11.5–15.5)
WBC: 10.7 10*3/uL — ABNORMAL HIGH (ref 4.0–10.5)
nRBC: 0 % (ref 0.0–0.2)

## 2020-08-02 LAB — FERRITIN: Ferritin: 593 ng/mL — ABNORMAL HIGH (ref 11–307)

## 2020-08-02 LAB — MAGNESIUM: Magnesium: 1.6 mg/dL — ABNORMAL LOW (ref 1.7–2.4)

## 2020-08-02 LAB — IRON AND TIBC
Iron: 78 ug/dL (ref 28–170)
Saturation Ratios: 30 % (ref 10.4–31.8)
TIBC: 258 ug/dL (ref 250–450)
UIBC: 180 ug/dL

## 2020-08-02 LAB — POCT HEMOGLOBIN-HEMACUE: Hemoglobin: 9.9 g/dL — ABNORMAL LOW (ref 12.0–15.0)

## 2020-08-02 MED ORDER — EPOETIN ALFA-EPBX 10000 UNIT/ML IJ SOLN
INTRAMUSCULAR | Status: AC
  Filled 2020-08-02: qty 1

## 2020-08-02 MED ORDER — EPOETIN ALFA-EPBX 10000 UNIT/ML IJ SOLN
10000.0000 [IU] | INTRAMUSCULAR | Status: DC
  Administered 2020-08-02: 10000 [IU] via SUBCUTANEOUS

## 2020-08-09 ENCOUNTER — Other Ambulatory Visit: Payer: Self-pay

## 2020-08-09 DIAGNOSIS — E1165 Type 2 diabetes mellitus with hyperglycemia: Secondary | ICD-10-CM

## 2020-08-09 MED ORDER — INSULIN GLARGINE 100 UNIT/ML ~~LOC~~ SOLN: SUBCUTANEOUS | 3 refills | Status: DC

## 2020-08-16 ENCOUNTER — Other Ambulatory Visit: Payer: Self-pay

## 2020-08-16 ENCOUNTER — Encounter (HOSPITAL_COMMUNITY)
Admission: RE | Admit: 2020-08-16 | Discharge: 2020-08-16 | Disposition: A | Discharge status: Home / Self Care | Payer: Medicare HMO | Source: Ambulatory Visit | Attending: Nephrology | Admitting: Nephrology

## 2020-08-16 VITALS — BP 141/60 | HR 63 | Temp 97.3°F | Resp 20

## 2020-08-16 DIAGNOSIS — D631 Anemia in chronic kidney disease: Secondary | ICD-10-CM

## 2020-08-16 DIAGNOSIS — N189 Chronic kidney disease, unspecified: Secondary | ICD-10-CM | POA: Diagnosis not present

## 2020-08-16 DIAGNOSIS — N179 Acute kidney failure, unspecified: Secondary | ICD-10-CM

## 2020-08-16 LAB — POCT HEMOGLOBIN-HEMACUE: Hemoglobin: 9.5 g/dL — ABNORMAL LOW (ref 12.0–15.0)

## 2020-08-16 MED ORDER — EPOETIN ALFA-EPBX 10000 UNIT/ML IJ SOLN
INTRAMUSCULAR | Status: AC
  Administered 2020-08-16: 10000 [IU]
  Filled 2020-08-16: qty 1

## 2020-08-16 MED ORDER — EPOETIN ALFA-EPBX 10000 UNIT/ML IJ SOLN: 10000.0000 [IU] | INTRAMUSCULAR | Status: DC

## 2020-08-26 ENCOUNTER — Encounter: Payer: Self-pay | Admitting: Family Medicine

## 2020-08-30 ENCOUNTER — Encounter (HOSPITAL_COMMUNITY)
Admission: RE | Admit: 2020-08-30 | Discharge: 2020-08-30 | Disposition: A | Discharge status: Home / Self Care | Payer: Medicare HMO | Source: Ambulatory Visit | Attending: Nephrology | Admitting: Nephrology

## 2020-08-30 ENCOUNTER — Other Ambulatory Visit: Payer: Self-pay

## 2020-08-30 VITALS — BP 135/57 | HR 70 | Temp 98.1°F | Resp 20

## 2020-08-30 DIAGNOSIS — N179 Acute kidney failure, unspecified: Secondary | ICD-10-CM | POA: Insufficient documentation

## 2020-08-30 DIAGNOSIS — N189 Chronic kidney disease, unspecified: Secondary | ICD-10-CM | POA: Diagnosis not present

## 2020-08-30 DIAGNOSIS — D631 Anemia in chronic kidney disease: Secondary | ICD-10-CM | POA: Diagnosis not present

## 2020-08-30 LAB — RENAL FUNCTION PANEL
Albumin: 3.5 g/dL (ref 3.5–5.0)
Anion gap: 11 (ref 5–15)
BUN: 62 mg/dL — ABNORMAL HIGH (ref 8–23)
CO2: 22 mmol/L (ref 22–32)
Calcium: 9.2 mg/dL (ref 8.9–10.3)
Chloride: 107 mmol/L (ref 98–111)
Creatinine, Ser: 2.8 mg/dL — ABNORMAL HIGH (ref 0.44–1.00)
GFR calc non Af Amer: 16 mL/min — ABNORMAL LOW (ref >60)
Glucose, Bld: 175 mg/dL — ABNORMAL HIGH (ref 70–99)
Phosphorus: 4.2 mg/dL (ref 2.5–4.6)
Potassium: 4.7 mmol/L (ref 3.5–5.1)
Sodium: 140 mmol/L (ref 135–145)

## 2020-08-30 LAB — IRON AND TIBC
Iron: 79 ug/dL (ref 28–170)
Saturation Ratios: 31 % (ref 10.4–31.8)
TIBC: 252 ug/dL (ref 250–450)
UIBC: 173 ug/dL

## 2020-08-30 LAB — CBC
HCT: 30.7 % — ABNORMAL LOW (ref 36.0–46.0)
Hemoglobin: 9.5 g/dL — ABNORMAL LOW (ref 12.0–15.0)
MCH: 30.4 pg (ref 26.0–34.0)
MCHC: 30.9 g/dL (ref 30.0–36.0)
MCV: 98.1 fL (ref 80.0–100.0)
Platelets: 167 10*3/uL (ref 150–400)
RBC: 3.13 MIL/uL — ABNORMAL LOW (ref 3.87–5.11)
RDW: 12.2 % (ref 11.5–15.5)
WBC: 9.3 10*3/uL (ref 4.0–10.5)
nRBC: 0 % (ref 0.0–0.2)

## 2020-08-30 LAB — POCT HEMOGLOBIN-HEMACUE: Hemoglobin: 9.8 g/dL — ABNORMAL LOW (ref 12.0–15.0)

## 2020-08-30 LAB — DIFFERENTIAL
Abs Immature Granulocytes: 0.03 10*3/uL (ref 0.00–0.07)
Basophils Absolute: 0.1 10*3/uL (ref 0.0–0.1)
Basophils Relative: 1 %
Eosinophils Absolute: 0.2 10*3/uL (ref 0.0–0.5)
Eosinophils Relative: 2 %
Immature Granulocytes: 0 %
Lymphocytes Relative: 21 %
Lymphs Abs: 2 10*3/uL (ref 0.7–4.0)
Monocytes Absolute: 0.6 10*3/uL (ref 0.1–1.0)
Monocytes Relative: 6 %
Neutro Abs: 6.5 10*3/uL (ref 1.7–7.7)
Neutrophils Relative %: 70 %

## 2020-08-30 LAB — FERRITIN: Ferritin: 480 ng/mL — ABNORMAL HIGH (ref 11–307)

## 2020-08-30 LAB — MAGNESIUM: Magnesium: 1.7 mg/dL (ref 1.7–2.4)

## 2020-08-30 MED ORDER — EPOETIN ALFA-EPBX 10000 UNIT/ML IJ SOLN
10000.0000 [IU] | INTRAMUSCULAR | Status: DC
  Administered 2020-08-30: 10000 [IU] via SUBCUTANEOUS

## 2020-08-30 MED ORDER — EPOETIN ALFA-EPBX 10000 UNIT/ML IJ SOLN
INTRAMUSCULAR | Status: AC
  Filled 2020-08-30: qty 1

## 2020-08-30 NOTE — Telephone Encounter (Signed)
Patient calls nurse line requesting refills on Lasix and Spironolactone 50mg . I advised her Spironolactone is not on her med list. Patient reports she takes this once a day. Please advise.

## 2020-08-31 MED ORDER — FUROSEMIDE 40 MG PO TABS
40.0000 mg | ORAL_TABLET | Freq: Every day | ORAL | 3 refills | Status: DC
Start: 2020-08-31 — End: 2021-01-16

## 2020-08-31 NOTE — Telephone Encounter (Signed)
Received prescription refill request for Lasix and Spironolactone. Will refill Lasix, however will not prescribe Spironolactone/Aldactone as patient has not had this medicine since 2019 according to her chart. It appears she was on this in 2016, dose sometimes 25mg , other times 100mg , this Rx is for 50mg . I called patient, no answer, left HIPAA-compliant message about my decision.   Milus Banister, [place], PGY-3 08/31/2020 11:59 AM

## 2020-09-01 ENCOUNTER — Encounter: Payer: Self-pay | Admitting: Family Medicine

## 2020-09-13 ENCOUNTER — Encounter (HOSPITAL_COMMUNITY)
Admission: RE | Admit: 2020-09-13 | Discharge: 2020-09-13 | Disposition: A | Discharge status: Home / Self Care | Payer: Medicare HMO | Source: Ambulatory Visit | Attending: Nephrology | Admitting: Nephrology

## 2020-09-13 VITALS — BP 139/57 | HR 66 | Temp 97.6°F | Resp 20

## 2020-09-13 DIAGNOSIS — D631 Anemia in chronic kidney disease: Secondary | ICD-10-CM | POA: Diagnosis not present

## 2020-09-13 DIAGNOSIS — N179 Acute kidney failure, unspecified: Secondary | ICD-10-CM | POA: Diagnosis not present

## 2020-09-13 DIAGNOSIS — N189 Chronic kidney disease, unspecified: Secondary | ICD-10-CM

## 2020-09-13 LAB — POCT HEMOGLOBIN-HEMACUE: Hemoglobin: 9.7 g/dL — ABNORMAL LOW (ref 12.0–15.0)

## 2020-09-13 MED ORDER — EPOETIN ALFA-EPBX 10000 UNIT/ML IJ SOLN
INTRAMUSCULAR | Status: AC
  Filled 2020-09-13: qty 1

## 2020-09-13 MED ORDER — EPOETIN ALFA-EPBX 10000 UNIT/ML IJ SOLN
10000.0000 [IU] | INTRAMUSCULAR | Status: DC
  Administered 2020-09-13: 10000 [IU] via SUBCUTANEOUS

## 2020-09-27 ENCOUNTER — Ambulatory Visit (HOSPITAL_COMMUNITY)
Admission: RE | Admit: 2020-09-27 | Discharge: 2020-09-27 | Disposition: A | Discharge status: Home / Self Care | Payer: Medicare HMO | Source: Ambulatory Visit | Attending: Nephrology | Admitting: Nephrology

## 2020-09-27 ENCOUNTER — Other Ambulatory Visit: Payer: Self-pay

## 2020-09-27 VITALS — BP 149/55 | HR 70 | Temp 98.4°F | Resp 20

## 2020-09-27 DIAGNOSIS — N189 Chronic kidney disease, unspecified: Secondary | ICD-10-CM | POA: Diagnosis not present

## 2020-09-27 DIAGNOSIS — N179 Acute kidney failure, unspecified: Secondary | ICD-10-CM | POA: Diagnosis not present

## 2020-09-27 DIAGNOSIS — D631 Anemia in chronic kidney disease: Secondary | ICD-10-CM | POA: Diagnosis not present

## 2020-09-27 LAB — DIFFERENTIAL
Abs Immature Granulocytes: 0.03 10*3/uL (ref 0.00–0.07)
Basophils Absolute: 0 10*3/uL (ref 0.0–0.1)
Basophils Relative: 0 %
Eosinophils Absolute: 0.4 10*3/uL (ref 0.0–0.5)
Eosinophils Relative: 4 %
Immature Granulocytes: 0 %
Lymphocytes Relative: 21 %
Lymphs Abs: 1.9 10*3/uL (ref 0.7–4.0)
Monocytes Absolute: 0.5 10*3/uL (ref 0.1–1.0)
Monocytes Relative: 6 %
Neutro Abs: 6.3 10*3/uL (ref 1.7–7.7)
Neutrophils Relative %: 69 %

## 2020-09-27 LAB — RENAL FUNCTION PANEL
Albumin: 3.5 g/dL (ref 3.5–5.0)
Anion gap: 11 (ref 5–15)
BUN: 76 mg/dL — ABNORMAL HIGH (ref 8–23)
CO2: 20 mmol/L — ABNORMAL LOW (ref 22–32)
Calcium: 9.3 mg/dL (ref 8.9–10.3)
Chloride: 106 mmol/L (ref 98–111)
Creatinine, Ser: 2.96 mg/dL — ABNORMAL HIGH (ref 0.44–1.00)
GFR, Estimated: 16 mL/min — ABNORMAL LOW (ref >60)
Glucose, Bld: 212 mg/dL — ABNORMAL HIGH (ref 70–99)
Phosphorus: 4.3 mg/dL (ref 2.5–4.6)
Potassium: 4.5 mmol/L (ref 3.5–5.1)
Sodium: 137 mmol/L (ref 135–145)

## 2020-09-27 LAB — CBC
HCT: 30.3 % — ABNORMAL LOW (ref 36.0–46.0)
Hemoglobin: 9.3 g/dL — ABNORMAL LOW (ref 12.0–15.0)
MCH: 30 pg (ref 26.0–34.0)
MCHC: 30.7 g/dL (ref 30.0–36.0)
MCV: 97.7 fL (ref 80.0–100.0)
Platelets: 159 10*3/uL (ref 150–400)
RBC: 3.1 MIL/uL — ABNORMAL LOW (ref 3.87–5.11)
RDW: 12.6 % (ref 11.5–15.5)
WBC: 9.1 10*3/uL (ref 4.0–10.5)
nRBC: 0 % (ref 0.0–0.2)

## 2020-09-27 LAB — IRON AND TIBC
Iron: 96 ug/dL (ref 28–170)
Saturation Ratios: 35 % — ABNORMAL HIGH (ref 10.4–31.8)
TIBC: 272 ug/dL (ref 250–450)
UIBC: 176 ug/dL

## 2020-09-27 LAB — POCT HEMOGLOBIN-HEMACUE: Hemoglobin: 9.7 g/dL — ABNORMAL LOW (ref 12.0–15.0)

## 2020-09-27 LAB — MAGNESIUM: Magnesium: 1.8 mg/dL (ref 1.7–2.4)

## 2020-09-27 LAB — FERRITIN: Ferritin: 506 ng/mL — ABNORMAL HIGH (ref 11–307)

## 2020-09-27 MED ORDER — EPOETIN ALFA-EPBX 10000 UNIT/ML IJ SOLN
INTRAMUSCULAR | Status: AC
  Filled 2020-09-27: qty 1

## 2020-09-27 MED ORDER — EPOETIN ALFA-EPBX 10000 UNIT/ML IJ SOLN
10000.0000 [IU] | INTRAMUSCULAR | Status: DC
  Administered 2020-09-27: 10000 [IU] via SUBCUTANEOUS

## 2020-10-11 ENCOUNTER — Other Ambulatory Visit: Payer: Self-pay | Admitting: Family Medicine

## 2020-10-11 ENCOUNTER — Encounter (HOSPITAL_COMMUNITY)
Admission: RE | Admit: 2020-10-11 | Discharge: 2020-10-11 | Disposition: A | Discharge status: Home / Self Care | Payer: Medicare HMO | Source: Ambulatory Visit | Attending: Nephrology | Admitting: Nephrology

## 2020-10-11 VITALS — BP 139/61 | HR 60 | Temp 97.6°F | Resp 20

## 2020-10-11 DIAGNOSIS — N179 Acute kidney failure, unspecified: Secondary | ICD-10-CM | POA: Insufficient documentation

## 2020-10-11 DIAGNOSIS — D631 Anemia in chronic kidney disease: Secondary | ICD-10-CM | POA: Diagnosis not present

## 2020-10-11 DIAGNOSIS — N189 Chronic kidney disease, unspecified: Secondary | ICD-10-CM | POA: Insufficient documentation

## 2020-10-11 MED ORDER — EPOETIN ALFA-EPBX 10000 UNIT/ML IJ SOLN
10000.0000 [IU] | INTRAMUSCULAR | Status: DC
  Administered 2020-10-11: 10000 [IU] via SUBCUTANEOUS

## 2020-10-11 MED ORDER — EPOETIN ALFA-EPBX 10000 UNIT/ML IJ SOLN
INTRAMUSCULAR | Status: AC
  Filled 2020-10-11: qty 1

## 2020-10-12 LAB — POCT HEMOGLOBIN-HEMACUE: Hemoglobin: 10.2 g/dL — ABNORMAL LOW (ref 12.0–15.0)

## 2020-10-25 ENCOUNTER — Encounter (HOSPITAL_COMMUNITY)
Admission: RE | Admit: 2020-10-25 | Discharge: 2020-10-25 | Disposition: A | Discharge status: Home / Self Care | Payer: Medicare HMO | Source: Ambulatory Visit | Attending: Nephrology | Admitting: Nephrology

## 2020-10-25 ENCOUNTER — Other Ambulatory Visit: Payer: Self-pay

## 2020-10-25 VITALS — BP 156/53 | HR 63 | Resp 18

## 2020-10-25 DIAGNOSIS — N179 Acute kidney failure, unspecified: Secondary | ICD-10-CM | POA: Diagnosis not present

## 2020-10-25 DIAGNOSIS — D631 Anemia in chronic kidney disease: Secondary | ICD-10-CM | POA: Diagnosis not present

## 2020-10-25 DIAGNOSIS — N189 Chronic kidney disease, unspecified: Secondary | ICD-10-CM

## 2020-10-25 LAB — POCT HEMOGLOBIN-HEMACUE: Hemoglobin: 9.7 g/dL — ABNORMAL LOW (ref 12.0–15.0)

## 2020-10-25 MED ORDER — EPOETIN ALFA-EPBX 10000 UNIT/ML IJ SOLN
INTRAMUSCULAR | Status: AC
  Filled 2020-10-25: qty 1

## 2020-10-25 MED ORDER — EPOETIN ALFA-EPBX 10000 UNIT/ML IJ SOLN
10000.0000 [IU] | INTRAMUSCULAR | Status: DC
  Administered 2020-10-25: 10000 [IU] via SUBCUTANEOUS

## 2020-11-05 ENCOUNTER — Other Ambulatory Visit: Payer: Self-pay

## 2020-11-05 ENCOUNTER — Ambulatory Visit (HOSPITAL_COMMUNITY)
Admission: EM | Admit: 2020-11-05 | Discharge: 2020-11-05 | Disposition: A | Discharge status: Home / Self Care | Payer: Medicare HMO

## 2020-11-05 ENCOUNTER — Encounter (HOSPITAL_COMMUNITY): Payer: Self-pay | Admitting: *Deleted

## 2020-11-05 DIAGNOSIS — H9192 Unspecified hearing loss, left ear: Secondary | ICD-10-CM | POA: Diagnosis not present

## 2020-11-05 DIAGNOSIS — M79602 Pain in left arm: Secondary | ICD-10-CM | POA: Diagnosis not present

## 2020-11-05 NOTE — ED Provider Notes (Signed)
[place]    CSN: [id number] Arrival date & time: 11/05/20  1410      History   Chief Complaint Chief Complaint  Patient presents with  . Ear Fullness  . Arm Pain    HPI Kimberly Carlson is a 74 y.o. female.   Patient with PMH DM 2, HTN,  here c/w L ear fullness and L arm pain x 2 months.  She has not seen PCP for this.  L ear - admits fullness, denies pain, tenderness, swelling, discharge, URI sx, cough, wheezing.  She has tried various OTC treatments w/o relief.  L arm - pain located upper arm, denies swelling, weakness, notes some n/t in L hand.  Pain worse with abduction and flexion.  No injury or trauma noted.  She is taking tylenol w/o relief.     Past Medical History:  Diagnosis Date  . Back pain    herniated disc lower back  . Diabetes mellitus without complication ([place])   . Hypertension     Patient Active Problem List   Diagnosis Date Noted  . Acute cystitis without hematuria 04/07/2020  . Orthopnea 07/16/2019  . Vertigo 07/16/2019  . Positive colorectal cancer screening using Cologuard test 11/24/2018  . Bilateral hand numbness 11/24/2018  . Anemia of chronic kidney failure, unspecified stage 07/25/2018  . Pain of both hip joints 07/15/2018  . Right wrist pain 07/11/2018  . Acute gout due to renal impairment involving right wrist 07/04/2018  . Acute on chronic renal failure ([place]) 07/01/2018  . Proteinuria 02/17/2018  . At risk for obstructive sleep apnea 01/07/2018  . Healthcare maintenance 11/13/2017  . Hypovitaminosis D 02/28/2016  . Right-sided low back pain without sciatica 02/27/2016  . Hematuria, undiagnosed cause 06/09/2015  . Cocaine use 06/08/2015  . DEGENERATIVE DISC DISEASE 11/09/2009  . History of tobacco use 09/03/2009  . Obesity 07/08/2009  . HERNIATED LUMBAR DISC 05/14/2007  . DM2 (diabetes mellitus, type 2) ([place]) 01/23/2007  . HYPERCHOLESTEROLEMIA 01/23/2007  . Resistant hypertension 01/23/2007    Past  Surgical History:  Procedure Laterality Date  . ABDOMINAL HYSTERECTOMY      OB History   No obstetric history on file.      Home Medications    Prior to Admission medications   Medication Sig Start Date End Date Taking? Authorizing Provider  acetaminophen (TYLENOL) 500 MG tablet Take 1,000 mg by mouth every 6 (six) hours as needed (for headaches).   Yes [provider]  aspirin 81 MG chewable tablet Chew 81 mg by mouth daily.   Yes [provider]  atorvastatin (LIPITOR) 40 MG tablet TAKE 1 TABLET BY MOUTH EVERY DAY AT 6PM 03/02/20  Yes Rumball, Jake Church, DO  calcium-vitamin D (OSCAL 500/200 D-3) 500-200 MG-UNIT tablet Take 2 tablets by mouth daily with breakfast. Patient taking differently: Take 1 tablet by mouth daily with breakfast. 02/27/16  Yes Olam Idler, MD  carvedilol (COREG) 6.25 MG tablet Take 4 tablets (25 mg total) by mouth 2 (two) times daily. 07/03/18 07/03/19 Yes Riccio, Angela C, DO  furosemide (LASIX) 40 MG tablet Take 1 tablet (40 mg total) by mouth daily. 08/31/20  Yes Milus Banister C, DO  hydrALAZINE (APRESOLINE) 50 MG tablet TAKE 1 AND 1/2 TABLETS(75 MG) BY MOUTH THREE TIMES DAILY 10/12/20  Yes Milus Banister C, DO  insulin glargine (LANTUS) 100 UNIT/ML injection INJECT 28 UNITS UNDER THE SKIN EVERY DAY 08/09/20  Yes Milus Banister C, DO  diclofenac sodium (VOLTAREN) 1 % GEL  Apply 4 g topically 4 (four) times daily. Patient not taking: No sig reported 07/09/18   Lucila Maine C, DO  glucose blood (ONE TOUCH ULTRA TEST) test strip Check sugar 6 x daily 10/02/16   Mayo, Pete Pelt, MD  Insulin Syringe-Needle U-100 (BD INSULIN SYRINGE U/F) 31G X 5/16" 1 ML MISC USE EVERY DAY 12/12/19   Myles Gip, DO  lidocaine (LIDODERM) 5 % Place 1 patch onto the skin daily. Remove & Discard patch within 12 hours or as directed by MD 07/02/18   Matilde Haymaker, MD  ONE TOUCH LANCETS [place] 1 each by Does not apply route See admin instructions. Check blood sugar once  daily. 02/21/15   Olam Idler, MD  polyethylene glycol [facility] / Floria Raveling) packet Take 17 g by mouth daily as needed for mild constipation. 02/06/18   Mayo, Pete Pelt, MD    Family History Family History  Adopted: Yes    Social History Social History   Tobacco Use  . Smoking status: Former Smoker    Packs/day: 1.00    Types: Cigarettes    Start date: 04/26/2014    Quit date: 07/23/2015    Years since quitting: 5.2  . Smokeless tobacco: Never Used  . Tobacco comment: Started in her 24's with multiple quit attempts for about 1 year at a time.  Vaping Use  . Vaping Use: Never used  Substance Use Topics  . Alcohol use: No  . Drug use: No     Allergies   Amlodipine, Augmentin [amoxicillin-pot clavulanate], Clonidine derivatives, Doxycycline, and Metformin   Review of Systems Review of Systems  Constitutional: Negative for chills, fatigue and fever.  HENT: Positive for hearing loss. Negative for congestion, ear discharge, ear pain, facial swelling, nosebleeds, postnasal drip, rhinorrhea, sinus pressure, sinus pain and sore throat.   Eyes: Negative for pain and redness.  Respiratory: Negative for cough, shortness of breath and wheezing.   Gastrointestinal: Negative for abdominal pain, diarrhea, nausea and vomiting.  Musculoskeletal: Positive for myalgias. Negative for arthralgias, gait problem and joint swelling.  Skin: Negative for color change and rash.  Neurological: Negative for light-headedness and headaches.  Hematological: Negative for adenopathy. Does not bruise/bleed easily.  Psychiatric/Behavioral: Negative for confusion and sleep disturbance.     Physical Exam Triage Vital Signs ED Triage Vitals  Enc Vitals Group     BP 11/05/20 1600 (!) 184/79     Pulse Rate 11/05/20 1600 62     Resp 11/05/20 1600 16     Temp 11/05/20 1600 97.9 F (36.6 C)     Temp Source 11/05/20 1600 Oral     SpO2 11/05/20 1600 93 %     Weight --      Height --      Head  Circumference --      Peak Flow --      Pain Score 11/05/20 1601 6     Pain Loc --      Pain Edu? --      Excl. in [place]? --    No data found.  Updated Vital Signs BP (!) 184/79 Comment: states took HTN med "just a few minutes ago"  Pulse 62   Temp 97.9 F (36.6 C) (Oral)   Resp 16   LMP  (LMP Unknown)   SpO2 93%   Visual Acuity Right Eye Distance:   Left Eye Distance:   Bilateral Distance:    Right Eye Near:   Left Eye Near:    Bilateral Near:  Physical Exam Vitals and nursing note reviewed.  Constitutional:      General: She is not in acute distress.    Appearance: Normal appearance. She is not ill-appearing.  HENT:     Head: Normocephalic and atraumatic.     Right Ear: Tympanic membrane and ear canal normal.     Left Ear: Tympanic membrane and ear canal normal. No tenderness.  No middle ear effusion. There is no impacted cerumen (small amount of cerumen L ear, non-occlusive). No foreign body.     Nose: No congestion or rhinorrhea.     Mouth/Throat:     Pharynx: No oropharyngeal exudate or posterior oropharyngeal erythema.  Eyes:     General: No scleral icterus.    Extraocular Movements: Extraocular movements intact.     Conjunctiva/sclera: Conjunctivae normal.  Cardiovascular:     Rate and Rhythm: Normal rate and regular rhythm.     Heart sounds: No murmur heard.   Pulmonary:     Effort: Pulmonary effort is normal. No respiratory distress.     Breath sounds: Normal breath sounds. No wheezing or rales.  Musculoskeletal:     Left upper arm: Tenderness (upper arm, circumferencial ) present. No swelling, edema, lacerations or bony tenderness.     Left hand: No swelling. Normal capillary refill. Normal pulse.     Cervical back: Normal range of motion. No rigidity.  Skin:    Capillary Refill: Capillary refill takes less than 2 seconds.     Coloration: Skin is not jaundiced.     Findings: No rash.  Neurological:     General: No focal deficit present.      Mental Status: She is alert and oriented to person, place, and time.     Motor: No weakness.     Gait: Gait normal.  Psychiatric:        Mood and Affect: Mood normal.        Behavior: Behavior normal.      UC Treatments / Results  Labs (all labs ordered are listed, but only abnormal results are displayed) Labs Reviewed - No data to display  EKG   Radiology No results found.  Procedures Procedures (including critical care time)  Medications Ordered in UC Medications - No data to display  Initial Impression / Assessment and Plan / UC Course  I have reviewed the triage vital signs and the nursing notes.  Pertinent labs & imaging results that were available during my care of the patient were reviewed by me and considered in my medical decision making (see chart for details).     Follow up with PCP Apply ice and heat to arm Stretch arm daily Final Clinical Impressions(s) / UC Diagnoses   Final diagnoses:  Left arm pain  Hearing loss of left ear, unspecified hearing loss type     Discharge Instructions     Follow up with PCP for further evaluation of arm pain and ear fullness.  Apply ice and heat to arm 15 minutes 4 times per day.  Stretch arm daily.  Take tylenol as needed for pain.    ED Prescriptions    None     PDMP not reviewed this encounter.   Peri [place], PA-C 11/05/20 1626

## 2020-11-05 NOTE — ED Triage Notes (Signed)
C/O left ear "stopped up" x 2 months without pain. C/O left arm pain x 2 months - reports pain worse with movement and ROM, with numbness intermittently in left hand.  All LUE digits warm with prompt cap refill.

## 2020-11-05 NOTE — Discharge Instructions (Addendum)
Follow up with PCP for further evaluation of arm pain and ear fullness.  Apply ice and heat to arm 15 minutes 4 times per day.  Stretch arm daily.  Take tylenol as needed for pain.

## 2020-11-08 ENCOUNTER — Encounter (HOSPITAL_COMMUNITY)
Admission: RE | Admit: 2020-11-08 | Discharge: 2020-11-08 | Disposition: A | Discharge status: Home / Self Care | Payer: Medicare HMO | Source: Ambulatory Visit | Attending: Nephrology | Admitting: Nephrology

## 2020-11-08 ENCOUNTER — Other Ambulatory Visit: Payer: Self-pay

## 2020-11-08 VITALS — BP 136/50 | HR 65 | Temp 97.6°F | Resp 16

## 2020-11-08 DIAGNOSIS — D631 Anemia in chronic kidney disease: Secondary | ICD-10-CM | POA: Insufficient documentation

## 2020-11-08 DIAGNOSIS — N179 Acute kidney failure, unspecified: Secondary | ICD-10-CM | POA: Insufficient documentation

## 2020-11-08 DIAGNOSIS — N189 Chronic kidney disease, unspecified: Secondary | ICD-10-CM | POA: Diagnosis not present

## 2020-11-08 LAB — IRON AND TIBC
Iron: 85 ug/dL (ref 28–170)
Saturation Ratios: 32 % — ABNORMAL HIGH (ref 10.4–31.8)
TIBC: 265 ug/dL (ref 250–450)
UIBC: 180 ug/dL

## 2020-11-08 LAB — CBC
HCT: 31.1 % — ABNORMAL LOW (ref 36.0–46.0)
Hemoglobin: 9.9 g/dL — ABNORMAL LOW (ref 12.0–15.0)
MCH: 31.2 pg (ref 26.0–34.0)
MCHC: 31.8 g/dL (ref 30.0–36.0)
MCV: 98.1 fL (ref 80.0–100.0)
Platelets: 150 10*3/uL (ref 150–400)
RBC: 3.17 MIL/uL — ABNORMAL LOW (ref 3.87–5.11)
RDW: 13 % (ref 11.5–15.5)
WBC: 9.4 10*3/uL (ref 4.0–10.5)
nRBC: 0 % (ref 0.0–0.2)

## 2020-11-08 LAB — MAGNESIUM: Magnesium: 1.7 mg/dL (ref 1.7–2.4)

## 2020-11-08 LAB — RENAL FUNCTION PANEL
Albumin: 3.7 g/dL (ref 3.5–5.0)
Anion gap: 10 (ref 5–15)
BUN: 90 mg/dL — ABNORMAL HIGH (ref 8–23)
CO2: 17 mmol/L — ABNORMAL LOW (ref 22–32)
Calcium: 9.4 mg/dL (ref 8.9–10.3)
Chloride: 112 mmol/L — ABNORMAL HIGH (ref 98–111)
Creatinine, Ser: 3.32 mg/dL — ABNORMAL HIGH (ref 0.44–1.00)
GFR, Estimated: 14 mL/min — ABNORMAL LOW (ref >60)
Glucose, Bld: 147 mg/dL — ABNORMAL HIGH (ref 70–99)
Phosphorus: 5.5 mg/dL — ABNORMAL HIGH (ref 2.5–4.6)
Potassium: 5 mmol/L (ref 3.5–5.1)
Sodium: 139 mmol/L (ref 135–145)

## 2020-11-08 LAB — DIFFERENTIAL
Abs Immature Granulocytes: 0.03 10*3/uL (ref 0.00–0.07)
Basophils Absolute: 0 10*3/uL (ref 0.0–0.1)
Basophils Relative: 0 %
Eosinophils Absolute: 0.2 10*3/uL (ref 0.0–0.5)
Eosinophils Relative: 2 %
Immature Granulocytes: 0 %
Lymphocytes Relative: 19 %
Lymphs Abs: 1.8 10*3/uL (ref 0.7–4.0)
Monocytes Absolute: 0.6 10*3/uL (ref 0.1–1.0)
Monocytes Relative: 6 %
Neutro Abs: 6.8 10*3/uL (ref 1.7–7.7)
Neutrophils Relative %: 73 %

## 2020-11-08 LAB — FERRITIN: Ferritin: 456 ng/mL — ABNORMAL HIGH (ref 11–307)

## 2020-11-08 LAB — POCT HEMOGLOBIN-HEMACUE: Hemoglobin: 10.2 g/dL — ABNORMAL LOW (ref 12.0–15.0)

## 2020-11-08 MED ORDER — EPOETIN ALFA-EPBX 10000 UNIT/ML IJ SOLN
10000.0000 [IU] | INTRAMUSCULAR | Status: DC
  Administered 2020-11-08: 10000 [IU] via SUBCUTANEOUS

## 2020-11-08 MED ORDER — EPOETIN ALFA-EPBX 10000 UNIT/ML IJ SOLN
INTRAMUSCULAR | Status: AC
  Filled 2020-11-08: qty 1

## 2020-11-22 ENCOUNTER — Encounter (HOSPITAL_COMMUNITY)
Admission: RE | Admit: 2020-11-22 | Discharge: 2020-11-22 | Disposition: A | Discharge status: Home / Self Care | Payer: Medicare HMO | Source: Ambulatory Visit | Attending: Nephrology | Admitting: Nephrology

## 2020-11-22 ENCOUNTER — Other Ambulatory Visit: Payer: Self-pay

## 2020-11-22 VITALS — BP 153/53 | HR 64 | Temp 97.7°F | Resp 20

## 2020-11-22 DIAGNOSIS — D631 Anemia in chronic kidney disease: Secondary | ICD-10-CM | POA: Diagnosis not present

## 2020-11-22 DIAGNOSIS — N179 Acute kidney failure, unspecified: Secondary | ICD-10-CM

## 2020-11-22 DIAGNOSIS — N189 Chronic kidney disease, unspecified: Secondary | ICD-10-CM | POA: Diagnosis not present

## 2020-11-22 LAB — POCT HEMOGLOBIN-HEMACUE: Hemoglobin: 9.7 g/dL — ABNORMAL LOW (ref 12.0–15.0)

## 2020-11-22 MED ORDER — EPOETIN ALFA-EPBX 10000 UNIT/ML IJ SOLN
INTRAMUSCULAR | Status: AC
  Filled 2020-11-22: qty 1

## 2020-11-22 MED ORDER — EPOETIN ALFA-EPBX 10000 UNIT/ML IJ SOLN
10000.0000 [IU] | INTRAMUSCULAR | Status: DC
  Administered 2020-11-22: 10000 [IU] via SUBCUTANEOUS

## 2020-12-06 ENCOUNTER — Encounter (HOSPITAL_COMMUNITY)
Admission: RE | Admit: 2020-12-06 | Discharge: 2020-12-06 | Disposition: A | Discharge status: Home / Self Care | Payer: Medicare HMO | Source: Ambulatory Visit | Attending: Nephrology | Admitting: Nephrology

## 2020-12-06 ENCOUNTER — Other Ambulatory Visit: Payer: Self-pay

## 2020-12-06 VITALS — BP 138/44 | Resp 20

## 2020-12-06 DIAGNOSIS — D631 Anemia in chronic kidney disease: Secondary | ICD-10-CM | POA: Diagnosis not present

## 2020-12-06 DIAGNOSIS — N189 Chronic kidney disease, unspecified: Secondary | ICD-10-CM | POA: Insufficient documentation

## 2020-12-06 DIAGNOSIS — N179 Acute kidney failure, unspecified: Secondary | ICD-10-CM | POA: Insufficient documentation

## 2020-12-06 LAB — POCT HEMOGLOBIN-HEMACUE: Hemoglobin: 9.9 g/dL — ABNORMAL LOW (ref 12.0–15.0)

## 2020-12-06 LAB — RENAL FUNCTION PANEL
Albumin: 3.8 g/dL (ref 3.5–5.0)
Anion gap: 10 (ref 5–15)
BUN: 95 mg/dL — ABNORMAL HIGH (ref 8–23)
CO2: 18 mmol/L — ABNORMAL LOW (ref 22–32)
Calcium: 9.5 mg/dL (ref 8.9–10.3)
Chloride: 111 mmol/L (ref 98–111)
Creatinine, Ser: 3.33 mg/dL — ABNORMAL HIGH (ref 0.44–1.00)
GFR, Estimated: 14 mL/min — ABNORMAL LOW (ref >60)
Glucose, Bld: 87 mg/dL (ref 70–99)
Phosphorus: 5 mg/dL — ABNORMAL HIGH (ref 2.5–4.6)
Potassium: 5.4 mmol/L — ABNORMAL HIGH (ref 3.5–5.1)
Sodium: 139 mmol/L (ref 135–145)

## 2020-12-06 LAB — IRON AND TIBC
Iron: 87 ug/dL (ref 28–170)
Saturation Ratios: 31 % (ref 10.4–31.8)
TIBC: 279 ug/dL (ref 250–450)
UIBC: 192 ug/dL

## 2020-12-06 LAB — FERRITIN: Ferritin: 456 ng/mL — ABNORMAL HIGH (ref 11–307)

## 2020-12-06 LAB — MAGNESIUM: Magnesium: 1.7 mg/dL (ref 1.7–2.4)

## 2020-12-06 MED ORDER — EPOETIN ALFA-EPBX 10000 UNIT/ML IJ SOLN
INTRAMUSCULAR | Status: AC
  Administered 2020-12-06: 10000 [IU] via SUBCUTANEOUS
  Filled 2020-12-06: qty 1

## 2020-12-06 MED ORDER — EPOETIN ALFA-EPBX 10000 UNIT/ML IJ SOLN: 10000.0000 [IU] | INTRAMUSCULAR | Status: DC

## 2020-12-07 DIAGNOSIS — N184 Chronic kidney disease, stage 4 (severe): Secondary | ICD-10-CM | POA: Diagnosis not present

## 2020-12-07 DIAGNOSIS — D631 Anemia in chronic kidney disease: Secondary | ICD-10-CM | POA: Diagnosis not present

## 2020-12-07 DIAGNOSIS — N2581 Secondary hyperparathyroidism of renal origin: Secondary | ICD-10-CM | POA: Diagnosis not present

## 2020-12-07 DIAGNOSIS — I129 Hypertensive chronic kidney disease with stage 1 through stage 4 chronic kidney disease, or unspecified chronic kidney disease: Secondary | ICD-10-CM | POA: Diagnosis not present

## 2020-12-07 NOTE — Progress Notes (Signed)
Voicemail left by main lab after we had left for the day on potassium of 5.4.  I called and spoke to Angie at [facility] to see if lab had made them aware of the value.  They had not.  Angie noted the lab and will let Dr Posey Pronto know.

## 2020-12-20 ENCOUNTER — Other Ambulatory Visit: Payer: Self-pay

## 2020-12-20 ENCOUNTER — Encounter (HOSPITAL_COMMUNITY)
Admission: RE | Admit: 2020-12-20 | Discharge: 2020-12-20 | Disposition: A | Discharge status: Home / Self Care | Payer: Medicare HMO | Source: Ambulatory Visit | Attending: Nephrology | Admitting: Nephrology

## 2020-12-20 VITALS — BP 146/52 | HR 67 | Temp 97.8°F | Resp 18

## 2020-12-20 DIAGNOSIS — D631 Anemia in chronic kidney disease: Secondary | ICD-10-CM | POA: Diagnosis not present

## 2020-12-20 DIAGNOSIS — N179 Acute kidney failure, unspecified: Secondary | ICD-10-CM | POA: Diagnosis not present

## 2020-12-20 DIAGNOSIS — N189 Chronic kidney disease, unspecified: Secondary | ICD-10-CM

## 2020-12-20 LAB — POCT HEMOGLOBIN-HEMACUE: Hemoglobin: 10.5 g/dL — ABNORMAL LOW (ref 12.0–15.0)

## 2020-12-20 MED ORDER — EPOETIN ALFA-EPBX 10000 UNIT/ML IJ SOLN
INTRAMUSCULAR | Status: AC
  Administered 2020-12-20: 10000 [IU] via SUBCUTANEOUS
  Filled 2020-12-20: qty 1

## 2020-12-20 MED ORDER — EPOETIN ALFA-EPBX 10000 UNIT/ML IJ SOLN: 10000.0000 [IU] | INTRAMUSCULAR | Status: DC

## 2021-01-03 ENCOUNTER — Encounter (HOSPITAL_COMMUNITY)
Admission: RE | Admit: 2021-01-03 | Discharge: 2021-01-03 | Disposition: A | Discharge status: Home / Self Care | Payer: Medicare HMO | Source: Ambulatory Visit | Attending: Nephrology | Admitting: Nephrology

## 2021-01-03 ENCOUNTER — Other Ambulatory Visit: Payer: Self-pay

## 2021-01-03 VITALS — BP 161/57 | HR 66 | Temp 97.3°F | Resp 20

## 2021-01-03 DIAGNOSIS — D631 Anemia in chronic kidney disease: Secondary | ICD-10-CM | POA: Diagnosis not present

## 2021-01-03 DIAGNOSIS — N179 Acute kidney failure, unspecified: Secondary | ICD-10-CM | POA: Insufficient documentation

## 2021-01-03 DIAGNOSIS — N189 Chronic kidney disease, unspecified: Secondary | ICD-10-CM | POA: Diagnosis not present

## 2021-01-03 LAB — RENAL FUNCTION PANEL
Albumin: 3.9 g/dL (ref 3.5–5.0)
Anion gap: 12 (ref 5–15)
BUN: 98 mg/dL — ABNORMAL HIGH (ref 8–23)
CO2: 17 mmol/L — ABNORMAL LOW (ref 22–32)
Calcium: 9.4 mg/dL (ref 8.9–10.3)
Chloride: 108 mmol/L (ref 98–111)
Creatinine, Ser: 3.76 mg/dL — ABNORMAL HIGH (ref 0.44–1.00)
GFR, Estimated: 12 mL/min — ABNORMAL LOW (ref >60)
Glucose, Bld: 107 mg/dL — ABNORMAL HIGH (ref 70–99)
Phosphorus: 4.9 mg/dL — ABNORMAL HIGH (ref 2.5–4.6)
Potassium: 4.8 mmol/L (ref 3.5–5.1)
Sodium: 137 mmol/L (ref 135–145)

## 2021-01-03 LAB — CBC
HCT: 32.7 % — ABNORMAL LOW (ref 36.0–46.0)
Hemoglobin: 10 g/dL — ABNORMAL LOW (ref 12.0–15.0)
MCH: 30.4 pg (ref 26.0–34.0)
MCHC: 30.6 g/dL (ref 30.0–36.0)
MCV: 99.4 fL (ref 80.0–100.0)
Platelets: 133 10*3/uL — ABNORMAL LOW (ref 150–400)
RBC: 3.29 MIL/uL — ABNORMAL LOW (ref 3.87–5.11)
RDW: 12.8 % (ref 11.5–15.5)
WBC: 9.7 10*3/uL (ref 4.0–10.5)
nRBC: 0 % (ref 0.0–0.2)

## 2021-01-03 LAB — DIFFERENTIAL
Abs Immature Granulocytes: 0.04 10*3/uL (ref 0.00–0.07)
Basophils Absolute: 0 10*3/uL (ref 0.0–0.1)
Basophils Relative: 0 %
Eosinophils Absolute: 0.2 10*3/uL (ref 0.0–0.5)
Eosinophils Relative: 2 %
Immature Granulocytes: 0 %
Lymphocytes Relative: 24 %
Lymphs Abs: 2.3 10*3/uL (ref 0.7–4.0)
Monocytes Absolute: 0.7 10*3/uL (ref 0.1–1.0)
Monocytes Relative: 8 %
Neutro Abs: 6.3 10*3/uL (ref 1.7–7.7)
Neutrophils Relative %: 66 %

## 2021-01-03 LAB — IRON AND TIBC
Iron: 90 ug/dL (ref 28–170)
Saturation Ratios: 33 % — ABNORMAL HIGH (ref 10.4–31.8)
TIBC: 269 ug/dL (ref 250–450)
UIBC: 179 ug/dL

## 2021-01-03 LAB — POCT HEMOGLOBIN-HEMACUE: Hemoglobin: 10.1 g/dL — ABNORMAL LOW (ref 12.0–15.0)

## 2021-01-03 LAB — FERRITIN: Ferritin: 461 ng/mL — ABNORMAL HIGH (ref 11–307)

## 2021-01-03 LAB — MAGNESIUM: Magnesium: 1.6 mg/dL — ABNORMAL LOW (ref 1.7–2.4)

## 2021-01-03 MED ORDER — EPOETIN ALFA-EPBX 10000 UNIT/ML IJ SOLN
10000.0000 [IU] | INTRAMUSCULAR | Status: DC
Start: 2021-01-03 — End: 2021-01-04
  Administered 2021-01-03: 10000 [IU] via SUBCUTANEOUS

## 2021-01-03 MED ORDER — EPOETIN ALFA-EPBX 10000 UNIT/ML IJ SOLN
INTRAMUSCULAR | Status: AC
  Filled 2021-01-03: qty 1

## 2021-01-06 ENCOUNTER — Encounter: Payer: Self-pay | Admitting: Family Medicine

## 2021-01-06 ENCOUNTER — Other Ambulatory Visit: Payer: Self-pay | Admitting: Family Medicine

## 2021-01-06 NOTE — Telephone Encounter (Signed)
Not our pt

## 2021-01-06 NOTE — Telephone Encounter (Signed)
   Notes to clinic Is this pt associated with your practice? Last rx by Rory Percy.

## 2021-01-09 ENCOUNTER — Other Ambulatory Visit: Payer: Self-pay | Admitting: Family Medicine

## 2021-01-09 MED ORDER — "INSULIN SYRINGE-NEEDLE U-100 31G X 5/16"" 1 ML MISC": 11 refills | Status: DC

## 2021-01-16 ENCOUNTER — Other Ambulatory Visit: Payer: Self-pay

## 2021-01-17 ENCOUNTER — Other Ambulatory Visit: Payer: Self-pay

## 2021-01-17 ENCOUNTER — Encounter (HOSPITAL_COMMUNITY)
Admission: RE | Admit: 2021-01-17 | Discharge: 2021-01-17 | Disposition: A | Discharge status: Home / Self Care | Payer: Medicare HMO | Source: Ambulatory Visit | Attending: Nephrology | Admitting: Nephrology

## 2021-01-17 VITALS — BP 130/52 | HR 68 | Temp 97.2°F | Resp 20

## 2021-01-17 DIAGNOSIS — D631 Anemia in chronic kidney disease: Secondary | ICD-10-CM

## 2021-01-17 DIAGNOSIS — N179 Acute kidney failure, unspecified: Secondary | ICD-10-CM

## 2021-01-17 DIAGNOSIS — N189 Chronic kidney disease, unspecified: Secondary | ICD-10-CM | POA: Diagnosis not present

## 2021-01-17 LAB — POCT HEMOGLOBIN-HEMACUE: Hemoglobin: 9.8 g/dL — ABNORMAL LOW (ref 12.0–15.0)

## 2021-01-17 MED ORDER — EPOETIN ALFA-EPBX 10000 UNIT/ML IJ SOLN
10000.0000 [IU] | INTRAMUSCULAR | Status: DC
  Administered 2021-01-17: 10000 [IU] via SUBCUTANEOUS

## 2021-01-17 MED ORDER — HYDRALAZINE HCL 50 MG PO TABS
ORAL_TABLET | ORAL | 1 refills | Status: DC
Start: 2021-01-17 — End: 2021-05-01

## 2021-01-17 MED ORDER — FUROSEMIDE 40 MG PO TABS: 40.0000 mg | ORAL_TABLET | Freq: Every day | ORAL | 3 refills | Status: DC

## 2021-01-17 MED ORDER — EPOETIN ALFA-EPBX 10000 UNIT/ML IJ SOLN
INTRAMUSCULAR | Status: AC
  Filled 2021-01-17: qty 1

## 2021-01-31 ENCOUNTER — Other Ambulatory Visit: Payer: Self-pay

## 2021-01-31 ENCOUNTER — Encounter (HOSPITAL_COMMUNITY)
Admission: RE | Admit: 2021-01-31 | Discharge: 2021-01-31 | Disposition: A | Discharge status: Home / Self Care | Payer: Medicare HMO | Source: Ambulatory Visit | Attending: Nephrology | Admitting: Nephrology

## 2021-01-31 DIAGNOSIS — N189 Chronic kidney disease, unspecified: Secondary | ICD-10-CM | POA: Insufficient documentation

## 2021-01-31 DIAGNOSIS — D631 Anemia in chronic kidney disease: Secondary | ICD-10-CM | POA: Diagnosis not present

## 2021-01-31 DIAGNOSIS — N179 Acute kidney failure, unspecified: Secondary | ICD-10-CM | POA: Diagnosis not present

## 2021-01-31 LAB — RENAL FUNCTION PANEL
Albumin: 3.6 g/dL (ref 3.5–5.0)
Anion gap: 9 (ref 5–15)
BUN: 78 mg/dL — ABNORMAL HIGH (ref 8–23)
CO2: 21 mmol/L — ABNORMAL LOW (ref 22–32)
Calcium: 9.5 mg/dL (ref 8.9–10.3)
Chloride: 106 mmol/L (ref 98–111)
Creatinine, Ser: 3.16 mg/dL — ABNORMAL HIGH (ref 0.44–1.00)
GFR, Estimated: 15 mL/min — ABNORMAL LOW (ref >60)
Glucose, Bld: 111 mg/dL — ABNORMAL HIGH (ref 70–99)
Phosphorus: 4.4 mg/dL (ref 2.5–4.6)
Potassium: 4.6 mmol/L (ref 3.5–5.1)
Sodium: 136 mmol/L (ref 135–145)

## 2021-01-31 LAB — IRON AND TIBC
Iron: 97 ug/dL (ref 28–170)
Saturation Ratios: 37 % — ABNORMAL HIGH (ref 10.4–31.8)
TIBC: 260 ug/dL (ref 250–450)
UIBC: 163 ug/dL

## 2021-01-31 LAB — DIFFERENTIAL
Abs Immature Granulocytes: 0.04 10*3/uL (ref 0.00–0.07)
Basophils Absolute: 0 10*3/uL (ref 0.0–0.1)
Basophils Relative: 0 %
Eosinophils Absolute: 0.2 10*3/uL (ref 0.0–0.5)
Eosinophils Relative: 3 %
Immature Granulocytes: 1 %
Lymphocytes Relative: 24 %
Lymphs Abs: 2.1 10*3/uL (ref 0.7–4.0)
Monocytes Absolute: 0.7 10*3/uL (ref 0.1–1.0)
Monocytes Relative: 7 %
Neutro Abs: 5.7 10*3/uL (ref 1.7–7.7)
Neutrophils Relative %: 65 %

## 2021-01-31 LAB — CBC
HCT: 29.8 % — ABNORMAL LOW (ref 36.0–46.0)
Hemoglobin: 9.5 g/dL — ABNORMAL LOW (ref 12.0–15.0)
MCH: 31.6 pg (ref 26.0–34.0)
MCHC: 31.9 g/dL (ref 30.0–36.0)
MCV: 99 fL (ref 80.0–100.0)
Platelets: 151 10*3/uL (ref 150–400)
RBC: 3.01 MIL/uL — ABNORMAL LOW (ref 3.87–5.11)
RDW: 12.6 % (ref 11.5–15.5)
WBC: 8.8 10*3/uL (ref 4.0–10.5)
nRBC: 0 % (ref 0.0–0.2)

## 2021-01-31 LAB — POCT HEMOGLOBIN-HEMACUE: Hemoglobin: 9.7 g/dL — ABNORMAL LOW (ref 12.0–15.0)

## 2021-01-31 LAB — MAGNESIUM: Magnesium: 1.9 mg/dL (ref 1.7–2.4)

## 2021-01-31 LAB — FERRITIN: Ferritin: 406 ng/mL — ABNORMAL HIGH (ref 11–307)

## 2021-01-31 MED ORDER — EPOETIN ALFA-EPBX 10000 UNIT/ML IJ SOLN
INTRAMUSCULAR | Status: AC
  Filled 2021-01-31: qty 1

## 2021-01-31 MED ORDER — EPOETIN ALFA-EPBX 10000 UNIT/ML IJ SOLN
10000.0000 [IU] | INTRAMUSCULAR | Status: DC
  Administered 2021-01-31: 10000 [IU] via SUBCUTANEOUS

## 2021-02-14 ENCOUNTER — Encounter (HOSPITAL_COMMUNITY)
Admission: RE | Admit: 2021-02-14 | Discharge: 2021-02-14 | Disposition: A | Discharge status: Home / Self Care | Payer: Medicare HMO | Source: Ambulatory Visit | Attending: Nephrology | Admitting: Nephrology

## 2021-02-14 ENCOUNTER — Other Ambulatory Visit: Payer: Self-pay

## 2021-02-14 VITALS — BP 155/54 | HR 61 | Temp 97.1°F | Resp 20

## 2021-02-14 DIAGNOSIS — N189 Chronic kidney disease, unspecified: Secondary | ICD-10-CM | POA: Diagnosis not present

## 2021-02-14 DIAGNOSIS — N179 Acute kidney failure, unspecified: Secondary | ICD-10-CM | POA: Diagnosis not present

## 2021-02-14 DIAGNOSIS — D631 Anemia in chronic kidney disease: Secondary | ICD-10-CM

## 2021-02-14 LAB — POCT HEMOGLOBIN-HEMACUE: Hemoglobin: 10.1 g/dL — ABNORMAL LOW (ref 12.0–15.0)

## 2021-02-14 MED ORDER — EPOETIN ALFA-EPBX 10000 UNIT/ML IJ SOLN
INTRAMUSCULAR | Status: AC
  Administered 2021-02-14: 10000 [IU] via SUBCUTANEOUS
  Filled 2021-02-14: qty 1

## 2021-02-14 MED ORDER — EPOETIN ALFA-EPBX 10000 UNIT/ML IJ SOLN: 10000.0000 [IU] | INTRAMUSCULAR | Status: DC

## 2021-02-24 ENCOUNTER — Other Ambulatory Visit: Payer: Self-pay | Admitting: Family Medicine

## 2021-02-28 ENCOUNTER — Other Ambulatory Visit: Payer: Self-pay

## 2021-02-28 ENCOUNTER — Encounter (HOSPITAL_COMMUNITY)
Admission: RE | Admit: 2021-02-28 | Discharge: 2021-02-28 | Disposition: A | Discharge status: Home / Self Care | Payer: Medicare HMO | Source: Ambulatory Visit | Attending: Nephrology | Admitting: Nephrology

## 2021-02-28 VITALS — BP 154/60 | HR 66 | Temp 98.4°F | Resp 18

## 2021-02-28 DIAGNOSIS — D631 Anemia in chronic kidney disease: Secondary | ICD-10-CM | POA: Diagnosis not present

## 2021-02-28 DIAGNOSIS — N189 Chronic kidney disease, unspecified: Secondary | ICD-10-CM | POA: Insufficient documentation

## 2021-02-28 DIAGNOSIS — N179 Acute kidney failure, unspecified: Secondary | ICD-10-CM | POA: Insufficient documentation

## 2021-02-28 LAB — CBC
HCT: 31.2 % — ABNORMAL LOW (ref 36.0–46.0)
Hemoglobin: 9.6 g/dL — ABNORMAL LOW (ref 12.0–15.0)
MCH: 30.8 pg (ref 26.0–34.0)
MCHC: 30.8 g/dL (ref 30.0–36.0)
MCV: 100 fL (ref 80.0–100.0)
Platelets: 155 10*3/uL (ref 150–400)
RBC: 3.12 MIL/uL — ABNORMAL LOW (ref 3.87–5.11)
RDW: 12.3 % (ref 11.5–15.5)
WBC: 9.3 10*3/uL (ref 4.0–10.5)
nRBC: 0 % (ref 0.0–0.2)

## 2021-02-28 LAB — RENAL FUNCTION PANEL
Albumin: 3.6 g/dL (ref 3.5–5.0)
Anion gap: 8 (ref 5–15)
BUN: 73 mg/dL — ABNORMAL HIGH (ref 8–23)
CO2: 23 mmol/L (ref 22–32)
Calcium: 9.4 mg/dL (ref 8.9–10.3)
Chloride: 105 mmol/L (ref 98–111)
Creatinine, Ser: 3.13 mg/dL — ABNORMAL HIGH (ref 0.44–1.00)
GFR, Estimated: 15 mL/min — ABNORMAL LOW (ref >60)
Glucose, Bld: 137 mg/dL — ABNORMAL HIGH (ref 70–99)
Phosphorus: 4.2 mg/dL (ref 2.5–4.6)
Potassium: 4.9 mmol/L (ref 3.5–5.1)
Sodium: 136 mmol/L (ref 135–145)

## 2021-02-28 LAB — IRON AND TIBC
Iron: 93 ug/dL (ref 28–170)
Saturation Ratios: 34 % — ABNORMAL HIGH (ref 10.4–31.8)
TIBC: 273 ug/dL (ref 250–450)
UIBC: 180 ug/dL

## 2021-02-28 LAB — MAGNESIUM: Magnesium: 1.8 mg/dL (ref 1.7–2.4)

## 2021-02-28 LAB — DIFFERENTIAL
Abs Immature Granulocytes: 0.05 10*3/uL (ref 0.00–0.07)
Basophils Absolute: 0.1 10*3/uL (ref 0.0–0.1)
Basophils Relative: 1 %
Eosinophils Absolute: 0.2 10*3/uL (ref 0.0–0.5)
Eosinophils Relative: 3 %
Immature Granulocytes: 1 %
Lymphocytes Relative: 26 %
Lymphs Abs: 2.4 10*3/uL (ref 0.7–4.0)
Monocytes Absolute: 0.7 10*3/uL (ref 0.1–1.0)
Monocytes Relative: 7 %
Neutro Abs: 5.9 10*3/uL (ref 1.7–7.7)
Neutrophils Relative %: 62 %

## 2021-02-28 LAB — POCT HEMOGLOBIN-HEMACUE: Hemoglobin: 9.5 g/dL — ABNORMAL LOW (ref 12.0–15.0)

## 2021-02-28 LAB — FERRITIN: Ferritin: 459 ng/mL — ABNORMAL HIGH (ref 11–307)

## 2021-02-28 MED ORDER — EPOETIN ALFA-EPBX 10000 UNIT/ML IJ SOLN
10000.0000 [IU] | INTRAMUSCULAR | Status: DC
  Administered 2021-02-28: 10000 [IU] via SUBCUTANEOUS

## 2021-02-28 MED ORDER — EPOETIN ALFA-EPBX 10000 UNIT/ML IJ SOLN
INTRAMUSCULAR | Status: AC
  Filled 2021-02-28: qty 1

## 2021-03-14 ENCOUNTER — Encounter (HOSPITAL_COMMUNITY)
Admission: RE | Admit: 2021-03-14 | Discharge: 2021-03-14 | Disposition: A | Discharge status: Home / Self Care | Payer: Medicare HMO | Source: Ambulatory Visit | Attending: Nephrology | Admitting: Nephrology

## 2021-03-14 VITALS — BP 170/56 | HR 64 | Temp 97.2°F | Resp 18

## 2021-03-14 DIAGNOSIS — D631 Anemia in chronic kidney disease: Secondary | ICD-10-CM | POA: Insufficient documentation

## 2021-03-14 DIAGNOSIS — N189 Chronic kidney disease, unspecified: Secondary | ICD-10-CM | POA: Insufficient documentation

## 2021-03-14 DIAGNOSIS — N179 Acute kidney failure, unspecified: Secondary | ICD-10-CM | POA: Insufficient documentation

## 2021-03-14 LAB — POCT HEMOGLOBIN-HEMACUE: Hemoglobin: 9.6 g/dL — ABNORMAL LOW (ref 12.0–15.0)

## 2021-03-14 MED ORDER — EPOETIN ALFA-EPBX 10000 UNIT/ML IJ SOLN
10000.0000 [IU] | INTRAMUSCULAR | Status: DC
  Administered 2021-03-14: 10000 [IU] via SUBCUTANEOUS

## 2021-03-14 MED ORDER — EPOETIN ALFA-EPBX 10000 UNIT/ML IJ SOLN
INTRAMUSCULAR | Status: AC
  Filled 2021-03-14: qty 1

## 2021-03-15 DIAGNOSIS — Z961 Presence of intraocular lens: Secondary | ICD-10-CM | POA: Diagnosis not present

## 2021-03-15 DIAGNOSIS — H43812 Vitreous degeneration, left eye: Secondary | ICD-10-CM | POA: Diagnosis not present

## 2021-03-15 DIAGNOSIS — H25811 Combined forms of age-related cataract, right eye: Secondary | ICD-10-CM | POA: Diagnosis not present

## 2021-03-15 DIAGNOSIS — E119 Type 2 diabetes mellitus without complications: Secondary | ICD-10-CM | POA: Diagnosis not present

## 2021-03-15 DIAGNOSIS — Z794 Long term (current) use of insulin: Secondary | ICD-10-CM | POA: Diagnosis not present

## 2021-03-28 ENCOUNTER — Other Ambulatory Visit: Payer: Self-pay

## 2021-03-28 ENCOUNTER — Encounter (HOSPITAL_COMMUNITY)
Admission: RE | Admit: 2021-03-28 | Discharge: 2021-03-28 | Disposition: A | Discharge status: Home / Self Care | Payer: Medicare HMO | Source: Ambulatory Visit | Attending: Nephrology | Admitting: Nephrology

## 2021-03-28 VITALS — BP 156/61 | HR 64 | Resp 16

## 2021-03-28 DIAGNOSIS — N189 Chronic kidney disease, unspecified: Secondary | ICD-10-CM | POA: Diagnosis not present

## 2021-03-28 DIAGNOSIS — N179 Acute kidney failure, unspecified: Secondary | ICD-10-CM | POA: Diagnosis not present

## 2021-03-28 DIAGNOSIS — D631 Anemia in chronic kidney disease: Secondary | ICD-10-CM | POA: Diagnosis not present

## 2021-03-28 LAB — DIFFERENTIAL
Abs Immature Granulocytes: 0.03 10*3/uL (ref 0.00–0.07)
Basophils Absolute: 0 10*3/uL (ref 0.0–0.1)
Basophils Relative: 0 %
Eosinophils Absolute: 0.3 10*3/uL (ref 0.0–0.5)
Eosinophils Relative: 3 %
Immature Granulocytes: 0 %
Lymphocytes Relative: 23 %
Lymphs Abs: 2.1 10*3/uL (ref 0.7–4.0)
Monocytes Absolute: 0.6 10*3/uL (ref 0.1–1.0)
Monocytes Relative: 7 %
Neutro Abs: 5.9 10*3/uL (ref 1.7–7.7)
Neutrophils Relative %: 67 %

## 2021-03-28 LAB — IRON AND TIBC
Iron: 89 ug/dL (ref 28–170)
Saturation Ratios: 35 % — ABNORMAL HIGH (ref 10.4–31.8)
TIBC: 256 ug/dL (ref 250–450)
UIBC: 167 ug/dL

## 2021-03-28 LAB — CBC
HCT: 29.4 % — ABNORMAL LOW (ref 36.0–46.0)
Hemoglobin: 9.2 g/dL — ABNORMAL LOW (ref 12.0–15.0)
MCH: 31.4 pg (ref 26.0–34.0)
MCHC: 31.3 g/dL (ref 30.0–36.0)
MCV: 100.3 fL — ABNORMAL HIGH (ref 80.0–100.0)
Platelets: 139 10*3/uL — ABNORMAL LOW (ref 150–400)
RBC: 2.93 MIL/uL — ABNORMAL LOW (ref 3.87–5.11)
RDW: 12.2 % (ref 11.5–15.5)
WBC: 8.9 10*3/uL (ref 4.0–10.5)
nRBC: 0 % (ref 0.0–0.2)

## 2021-03-28 LAB — RENAL FUNCTION PANEL
Albumin: 3.6 g/dL (ref 3.5–5.0)
Anion gap: 9 (ref 5–15)
BUN: 62 mg/dL — ABNORMAL HIGH (ref 8–23)
CO2: 24 mmol/L (ref 22–32)
Calcium: 9.1 mg/dL (ref 8.9–10.3)
Chloride: 105 mmol/L (ref 98–111)
Creatinine, Ser: 2.97 mg/dL — ABNORMAL HIGH (ref 0.44–1.00)
GFR, Estimated: 16 mL/min — ABNORMAL LOW (ref >60)
Glucose, Bld: 107 mg/dL — ABNORMAL HIGH (ref 70–99)
Phosphorus: 4.3 mg/dL (ref 2.5–4.6)
Potassium: 4.5 mmol/L (ref 3.5–5.1)
Sodium: 138 mmol/L (ref 135–145)

## 2021-03-28 LAB — FERRITIN: Ferritin: 461 ng/mL — ABNORMAL HIGH (ref 11–307)

## 2021-03-28 LAB — MAGNESIUM: Magnesium: 1.6 mg/dL — ABNORMAL LOW (ref 1.7–2.4)

## 2021-03-28 LAB — POCT HEMOGLOBIN-HEMACUE: Hemoglobin: 9.4 g/dL — ABNORMAL LOW (ref 12.0–15.0)

## 2021-03-28 MED ORDER — EPOETIN ALFA-EPBX 10000 UNIT/ML IJ SOLN
INTRAMUSCULAR | Status: AC
  Administered 2021-03-28: 10000 [IU] via SUBCUTANEOUS
  Filled 2021-03-28: qty 1

## 2021-03-28 MED ORDER — EPOETIN ALFA-EPBX 10000 UNIT/ML IJ SOLN: 10000.0000 [IU] | INTRAMUSCULAR | Status: DC

## 2021-04-11 ENCOUNTER — Encounter (HOSPITAL_COMMUNITY)
Admission: RE | Admit: 2021-04-11 | Discharge: 2021-04-11 | Disposition: A | Discharge status: Home / Self Care | Payer: Medicare HMO | Source: Ambulatory Visit | Attending: Nephrology | Admitting: Nephrology

## 2021-04-11 ENCOUNTER — Other Ambulatory Visit: Payer: Self-pay

## 2021-04-11 VITALS — BP 160/64 | HR 61 | Temp 97.5°F | Resp 20

## 2021-04-11 DIAGNOSIS — N179 Acute kidney failure, unspecified: Secondary | ICD-10-CM | POA: Diagnosis not present

## 2021-04-11 DIAGNOSIS — D631 Anemia in chronic kidney disease: Secondary | ICD-10-CM

## 2021-04-11 DIAGNOSIS — N189 Chronic kidney disease, unspecified: Secondary | ICD-10-CM | POA: Diagnosis not present

## 2021-04-11 MED ORDER — EPOETIN ALFA-EPBX 10000 UNIT/ML IJ SOLN: 10000.0000 [IU] | INTRAMUSCULAR | Status: DC

## 2021-04-11 MED ORDER — EPOETIN ALFA-EPBX 10000 UNIT/ML IJ SOLN
INTRAMUSCULAR | Status: AC
  Administered 2021-04-11: 10000 [IU] via SUBCUTANEOUS
  Filled 2021-04-11: qty 1

## 2021-04-12 DIAGNOSIS — D631 Anemia in chronic kidney disease: Secondary | ICD-10-CM | POA: Diagnosis not present

## 2021-04-12 DIAGNOSIS — N184 Chronic kidney disease, stage 4 (severe): Secondary | ICD-10-CM | POA: Diagnosis not present

## 2021-04-12 DIAGNOSIS — I129 Hypertensive chronic kidney disease with stage 1 through stage 4 chronic kidney disease, or unspecified chronic kidney disease: Secondary | ICD-10-CM | POA: Diagnosis not present

## 2021-04-12 DIAGNOSIS — N2581 Secondary hyperparathyroidism of renal origin: Secondary | ICD-10-CM | POA: Diagnosis not present

## 2021-04-12 LAB — POCT HEMOGLOBIN-HEMACUE: Hemoglobin: 9.1 g/dL — ABNORMAL LOW (ref 12.0–15.0)

## 2021-04-25 ENCOUNTER — Other Ambulatory Visit: Payer: Self-pay

## 2021-04-25 ENCOUNTER — Encounter (HOSPITAL_COMMUNITY)
Admission: RE | Admit: 2021-04-25 | Discharge: 2021-04-25 | Disposition: A | Discharge status: Home / Self Care | Payer: Medicare HMO | Source: Ambulatory Visit | Attending: Nephrology | Admitting: Nephrology

## 2021-04-25 VITALS — BP 174/58 | HR 65 | Resp 18

## 2021-04-25 DIAGNOSIS — N189 Chronic kidney disease, unspecified: Secondary | ICD-10-CM

## 2021-04-25 DIAGNOSIS — D631 Anemia in chronic kidney disease: Secondary | ICD-10-CM | POA: Diagnosis not present

## 2021-04-25 DIAGNOSIS — N179 Acute kidney failure, unspecified: Secondary | ICD-10-CM | POA: Diagnosis not present

## 2021-04-25 LAB — POCT HEMOGLOBIN-HEMACUE: Hemoglobin: 9.4 g/dL — ABNORMAL LOW (ref 12.0–15.0)

## 2021-04-25 MED ORDER — EPOETIN ALFA-EPBX 10000 UNIT/ML IJ SOLN
INTRAMUSCULAR | Status: AC
  Administered 2021-04-25: 10000 [IU] via SUBCUTANEOUS
  Filled 2021-04-25: qty 1

## 2021-04-25 MED ORDER — EPOETIN ALFA-EPBX 10000 UNIT/ML IJ SOLN: 10000.0000 [IU] | INTRAMUSCULAR | Status: DC

## 2021-04-29 ENCOUNTER — Other Ambulatory Visit: Payer: Self-pay | Admitting: Family Medicine

## 2021-05-07 ENCOUNTER — Other Ambulatory Visit: Payer: Self-pay | Admitting: Family Medicine

## 2021-05-07 DIAGNOSIS — E7800 Pure hypercholesterolemia, unspecified: Secondary | ICD-10-CM

## 2021-05-09 ENCOUNTER — Encounter (HOSPITAL_COMMUNITY)
Admission: RE | Admit: 2021-05-09 | Discharge: 2021-05-09 | Disposition: A | Discharge status: Home / Self Care | Payer: Medicare HMO | Source: Ambulatory Visit | Attending: Nephrology | Admitting: Nephrology

## 2021-05-09 ENCOUNTER — Other Ambulatory Visit: Payer: Self-pay

## 2021-05-09 VITALS — BP 165/56 | HR 64 | Temp 97.4°F | Resp 20

## 2021-05-09 DIAGNOSIS — D631 Anemia in chronic kidney disease: Secondary | ICD-10-CM | POA: Insufficient documentation

## 2021-05-09 DIAGNOSIS — N189 Chronic kidney disease, unspecified: Secondary | ICD-10-CM | POA: Insufficient documentation

## 2021-05-09 DIAGNOSIS — N179 Acute kidney failure, unspecified: Secondary | ICD-10-CM | POA: Insufficient documentation

## 2021-05-09 LAB — RENAL FUNCTION PANEL
Albumin: 3.7 g/dL (ref 3.5–5.0)
Anion gap: 10 (ref 5–15)
BUN: 75 mg/dL — ABNORMAL HIGH (ref 8–23)
CO2: 22 mmol/L (ref 22–32)
Calcium: 9.6 mg/dL (ref 8.9–10.3)
Chloride: 106 mmol/L (ref 98–111)
Creatinine, Ser: 3.21 mg/dL — ABNORMAL HIGH (ref 0.44–1.00)
GFR, Estimated: 14 mL/min — ABNORMAL LOW (ref >60)
Glucose, Bld: 114 mg/dL — ABNORMAL HIGH (ref 70–99)
Phosphorus: 4.6 mg/dL (ref 2.5–4.6)
Potassium: 4.7 mmol/L (ref 3.5–5.1)
Sodium: 138 mmol/L (ref 135–145)

## 2021-05-09 LAB — DIFFERENTIAL
Abs Immature Granulocytes: 0.01 10*3/uL (ref 0.00–0.07)
Basophils Absolute: 0 10*3/uL (ref 0.0–0.1)
Basophils Relative: 1 %
Eosinophils Absolute: 0.3 10*3/uL (ref 0.0–0.5)
Eosinophils Relative: 3 %
Immature Granulocytes: 0 %
Lymphocytes Relative: 26 %
Lymphs Abs: 2 10*3/uL (ref 0.7–4.0)
Monocytes Absolute: 0.6 10*3/uL (ref 0.1–1.0)
Monocytes Relative: 8 %
Neutro Abs: 4.8 10*3/uL (ref 1.7–7.7)
Neutrophils Relative %: 62 %

## 2021-05-09 LAB — CBC
HCT: 30.5 % — ABNORMAL LOW (ref 36.0–46.0)
Hemoglobin: 9.5 g/dL — ABNORMAL LOW (ref 12.0–15.0)
MCH: 31.3 pg (ref 26.0–34.0)
MCHC: 31.1 g/dL (ref 30.0–36.0)
MCV: 100.3 fL — ABNORMAL HIGH (ref 80.0–100.0)
Platelets: 142 10*3/uL — ABNORMAL LOW (ref 150–400)
RBC: 3.04 MIL/uL — ABNORMAL LOW (ref 3.87–5.11)
RDW: 12.7 % (ref 11.5–15.5)
WBC: 7.6 10*3/uL (ref 4.0–10.5)
nRBC: 0 % (ref 0.0–0.2)

## 2021-05-09 LAB — POCT HEMOGLOBIN-HEMACUE: Hemoglobin: 9.7 g/dL — ABNORMAL LOW (ref 12.0–15.0)

## 2021-05-09 LAB — MAGNESIUM: Magnesium: 1.7 mg/dL (ref 1.7–2.4)

## 2021-05-09 LAB — IRON AND TIBC
Iron: 96 ug/dL (ref 28–170)
Saturation Ratios: 33 % — ABNORMAL HIGH (ref 10.4–31.8)
TIBC: 287 ug/dL (ref 250–450)
UIBC: 191 ug/dL

## 2021-05-09 LAB — FERRITIN: Ferritin: 384 ng/mL — ABNORMAL HIGH (ref 11–307)

## 2021-05-09 MED ORDER — EPOETIN ALFA-EPBX 10000 UNIT/ML IJ SOLN
10000.0000 [IU] | INTRAMUSCULAR | Status: DC
  Administered 2021-05-09: 10000 [IU] via SUBCUTANEOUS

## 2021-05-09 MED ORDER — EPOETIN ALFA-EPBX 10000 UNIT/ML IJ SOLN
INTRAMUSCULAR | Status: AC
  Filled 2021-05-09: qty 1

## 2021-05-10 ENCOUNTER — Other Ambulatory Visit: Payer: Self-pay

## 2021-05-10 DIAGNOSIS — E1165 Type 2 diabetes mellitus with hyperglycemia: Secondary | ICD-10-CM

## 2021-05-10 DIAGNOSIS — Z794 Long term (current) use of insulin: Secondary | ICD-10-CM

## 2021-05-10 MED ORDER — "INSULIN SYRINGE-NEEDLE U-100 31G X 5/16"" 1 ML MISC": 11 refills | Status: DC

## 2021-05-10 MED ORDER — INSULIN GLARGINE 100 UNIT/ML ~~LOC~~ SOLN: SUBCUTANEOUS | 3 refills | Status: DC

## 2021-05-15 ENCOUNTER — Telehealth: Payer: Medicare HMO | Admitting: Physician Assistant

## 2021-05-15 DIAGNOSIS — R0981 Nasal congestion: Secondary | ICD-10-CM

## 2021-05-15 MED ORDER — FLUTICASONE PROPIONATE 50 MCG/ACT NA SUSP: 2.0000 | Freq: Every day | NASAL | 0 refills | Status: DC

## 2021-05-15 NOTE — Progress Notes (Signed)
E visit for Allergic Rhinitis We are sorry that you are not feeling well.  Here is how we plan to help!  Without doing a physical exam in person to visualize your nose, it is hard to me to be able to diagnose you with polyps. There are many causes of nasal congestion, one cause could be related to allergic rhinitis and we can try to treat you for this to see if your symptoms improve. If your symptoms do not improve from this medication, it may be beneficial for you to follow up with your primary care provider or an ear nose and throat doctor for further evaluation.    Rhinitis is when a reaction occurs that causes nasal congestion, runny nose, sneezing, and itching.  Most types of rhinitis are caused by an inflammation and are associated with symptoms in the eyes ears or throat.  There are several types of rhinitis.  The most common are acute rhinitis, which is usually caused by a viral illness, allergic or seasonal rhinitis, and nonallergic or year-round rhinitis.  Nasal allergies occur certain times of the year.  Allergic rhinitis is caused when allergens in the air trigger the release of histamine in the body.  Histamine causes itching, swelling, and fluid to build up in the fragile linings of the nasal passages, sinuses and eyelids.  An itchy nose and clear discharge are common.  I recommend the following over the counter treatments: You should take a daily dose of antihistamine such a Claritin.  I also would recommend a nasal spray: Flonase 2 sprays into each nostril once daily, I have given you a prescription for this medicine.   HOME CARE:  You can use an over-the-counter saline nasal spray as needed Avoid areas where there is heavy dust, mites, or molds Stay indoors on windy days during the pollen season Keep windows closed in home, at least in bedroom; use air conditioner. Use high-efficiency house air filter Keep windows closed in car, turn AC on re-circulate Avoid playing out with  dog during pollen season  GET HELP RIGHT AWAY IF:  If your symptoms do not improve within 10 days You become short of breath You develop yellow or green discharge from your nose for over 3 days You have coughing fits  MAKE SURE YOU:  Understand these instructions Will watch your condition Will get help right away if you are not doing well or get worse  Thank you for choosing an e-visit. Your e-visit answers were reviewed by a board certified advanced clinical practitioner to complete your personal care plan. Depending upon the condition, your plan could have included both over the counter or prescription medications. Please review your pharmacy choice. Be sure that the pharmacy you have chosen is open so that you can pick up your prescription now.  If there is a problem you may message your provider in [place] to have the prescription routed to another pharmacy. Your safety is important to [place]. If you have drug allergies check your prescription carefully.  For the next 24 hours, you can use MyChart to ask questions about today's visit, request a non-urgent call back, or ask for a work or school excuse from your e-visit provider. You will get an email in the next two days asking about your experience. I hope that your e-visit has been valuable and will speed your recovery.   Approximately 5 minutes was spent documenting and reviewing patient's chart.

## 2021-05-16 ENCOUNTER — Encounter (HOSPITAL_COMMUNITY): Payer: Self-pay

## 2021-05-23 ENCOUNTER — Encounter (HOSPITAL_COMMUNITY)
Admission: RE | Admit: 2021-05-23 | Discharge: 2021-05-23 | Disposition: A | Discharge status: Home / Self Care | Payer: Medicare HMO | Source: Ambulatory Visit | Attending: Nephrology | Admitting: Nephrology

## 2021-05-23 ENCOUNTER — Other Ambulatory Visit: Payer: Self-pay

## 2021-05-23 VITALS — BP 155/65 | HR 73 | Resp 20

## 2021-05-23 DIAGNOSIS — N179 Acute kidney failure, unspecified: Secondary | ICD-10-CM | POA: Diagnosis not present

## 2021-05-23 DIAGNOSIS — D631 Anemia in chronic kidney disease: Secondary | ICD-10-CM

## 2021-05-23 DIAGNOSIS — N189 Chronic kidney disease, unspecified: Secondary | ICD-10-CM | POA: Diagnosis not present

## 2021-05-23 LAB — POCT HEMOGLOBIN-HEMACUE: Hemoglobin: 9.8 g/dL — ABNORMAL LOW (ref 12.0–15.0)

## 2021-05-23 MED ORDER — EPOETIN ALFA-EPBX 10000 UNIT/ML IJ SOLN: 10000.0000 [IU] | INTRAMUSCULAR | Status: DC

## 2021-05-23 MED ORDER — EPOETIN ALFA-EPBX 10000 UNIT/ML IJ SOLN
INTRAMUSCULAR | Status: AC
  Administered 2021-05-23: 10000 [IU] via SUBCUTANEOUS
  Filled 2021-05-23: qty 1

## 2021-06-06 ENCOUNTER — Encounter (HOSPITAL_COMMUNITY)
Admission: RE | Admit: 2021-06-06 | Discharge: 2021-06-06 | Disposition: A | Discharge status: Home / Self Care | Payer: Medicare HMO | Source: Ambulatory Visit | Attending: Nephrology | Admitting: Nephrology

## 2021-06-06 ENCOUNTER — Other Ambulatory Visit: Payer: Self-pay

## 2021-06-06 VITALS — BP 155/57 | HR 61 | Temp 97.2°F | Resp 20

## 2021-06-06 DIAGNOSIS — N179 Acute kidney failure, unspecified: Secondary | ICD-10-CM | POA: Insufficient documentation

## 2021-06-06 DIAGNOSIS — N189 Chronic kidney disease, unspecified: Secondary | ICD-10-CM | POA: Diagnosis not present

## 2021-06-06 DIAGNOSIS — D631 Anemia in chronic kidney disease: Secondary | ICD-10-CM | POA: Diagnosis not present

## 2021-06-06 LAB — DIFFERENTIAL
Abs Immature Granulocytes: 0.04 10*3/uL (ref 0.00–0.07)
Basophils Absolute: 0.1 10*3/uL (ref 0.0–0.1)
Basophils Relative: 1 %
Eosinophils Absolute: 0.2 10*3/uL (ref 0.0–0.5)
Eosinophils Relative: 2 %
Immature Granulocytes: 0 %
Lymphocytes Relative: 24 %
Lymphs Abs: 2.2 10*3/uL (ref 0.7–4.0)
Monocytes Absolute: 0.7 10*3/uL (ref 0.1–1.0)
Monocytes Relative: 8 %
Neutro Abs: 5.8 10*3/uL (ref 1.7–7.7)
Neutrophils Relative %: 65 %

## 2021-06-06 LAB — CBC
HCT: 30.5 % — ABNORMAL LOW (ref 36.0–46.0)
Hemoglobin: 9.5 g/dL — ABNORMAL LOW (ref 12.0–15.0)
MCH: 30.9 pg (ref 26.0–34.0)
MCHC: 31.1 g/dL (ref 30.0–36.0)
MCV: 99.3 fL (ref 80.0–100.0)
Platelets: 139 10*3/uL — ABNORMAL LOW (ref 150–400)
RBC: 3.07 MIL/uL — ABNORMAL LOW (ref 3.87–5.11)
RDW: 12.4 % (ref 11.5–15.5)
WBC: 8.9 10*3/uL (ref 4.0–10.5)
nRBC: 0 % (ref 0.0–0.2)

## 2021-06-06 LAB — RENAL FUNCTION PANEL
Albumin: 3.7 g/dL (ref 3.5–5.0)
Anion gap: 9 (ref 5–15)
BUN: 84 mg/dL — ABNORMAL HIGH (ref 8–23)
CO2: 20 mmol/L — ABNORMAL LOW (ref 22–32)
Calcium: 9.4 mg/dL (ref 8.9–10.3)
Chloride: 107 mmol/L (ref 98–111)
Creatinine, Ser: 3.32 mg/dL — ABNORMAL HIGH (ref 0.44–1.00)
GFR, Estimated: 14 mL/min — ABNORMAL LOW (ref >60)
Glucose, Bld: 137 mg/dL — ABNORMAL HIGH (ref 70–99)
Phosphorus: 4.9 mg/dL — ABNORMAL HIGH (ref 2.5–4.6)
Potassium: 4.9 mmol/L (ref 3.5–5.1)
Sodium: 136 mmol/L (ref 135–145)

## 2021-06-06 LAB — IRON AND TIBC
Iron: 86 ug/dL (ref 28–170)
Saturation Ratios: 33 % — ABNORMAL HIGH (ref 10.4–31.8)
TIBC: 262 ug/dL (ref 250–450)
UIBC: 176 ug/dL

## 2021-06-06 LAB — POCT HEMOGLOBIN-HEMACUE: Hemoglobin: 9.8 g/dL — ABNORMAL LOW (ref 12.0–15.0)

## 2021-06-06 LAB — MAGNESIUM: Magnesium: 1.8 mg/dL (ref 1.7–2.4)

## 2021-06-06 LAB — FERRITIN: Ferritin: 441 ng/mL — ABNORMAL HIGH (ref 11–307)

## 2021-06-06 MED ORDER — EPOETIN ALFA-EPBX 10000 UNIT/ML IJ SOLN
INTRAMUSCULAR | Status: AC
  Administered 2021-06-06: 10000 [IU] via SUBCUTANEOUS
  Filled 2021-06-06: qty 1

## 2021-06-06 MED ORDER — EPOETIN ALFA-EPBX 10000 UNIT/ML IJ SOLN: 10000.0000 [IU] | INTRAMUSCULAR | Status: DC

## 2021-06-20 ENCOUNTER — Other Ambulatory Visit: Payer: Self-pay

## 2021-06-20 ENCOUNTER — Encounter (HOSPITAL_COMMUNITY)
Admission: RE | Admit: 2021-06-20 | Discharge: 2021-06-20 | Disposition: A | Discharge status: Home / Self Care | Payer: Medicare HMO | Source: Ambulatory Visit | Attending: Nephrology | Admitting: Nephrology

## 2021-06-20 VITALS — BP 144/53 | HR 65 | Temp 98.4°F | Resp 20

## 2021-06-20 DIAGNOSIS — N189 Chronic kidney disease, unspecified: Secondary | ICD-10-CM | POA: Diagnosis not present

## 2021-06-20 DIAGNOSIS — D631 Anemia in chronic kidney disease: Secondary | ICD-10-CM | POA: Diagnosis not present

## 2021-06-20 DIAGNOSIS — N179 Acute kidney failure, unspecified: Secondary | ICD-10-CM

## 2021-06-20 LAB — POCT HEMOGLOBIN-HEMACUE: Hemoglobin: 9.6 g/dL — ABNORMAL LOW (ref 12.0–15.0)

## 2021-06-20 MED ORDER — EPOETIN ALFA-EPBX 10000 UNIT/ML IJ SOLN
INTRAMUSCULAR | Status: AC
  Administered 2021-06-20: 10000 [IU] via SUBCUTANEOUS
  Filled 2021-06-20: qty 1

## 2021-06-20 MED ORDER — EPOETIN ALFA-EPBX 10000 UNIT/ML IJ SOLN: 10000.0000 [IU] | INTRAMUSCULAR | Status: DC

## 2021-07-04 ENCOUNTER — Other Ambulatory Visit: Payer: Self-pay

## 2021-07-04 ENCOUNTER — Ambulatory Visit (HOSPITAL_COMMUNITY)
Admission: RE | Admit: 2021-07-04 | Discharge: 2021-07-04 | Disposition: A | Discharge status: Home / Self Care | Payer: Medicare HMO | Source: Ambulatory Visit | Attending: Nephrology | Admitting: Nephrology

## 2021-07-04 VITALS — BP 152/57 | HR 64 | Resp 20

## 2021-07-04 DIAGNOSIS — N189 Chronic kidney disease, unspecified: Secondary | ICD-10-CM | POA: Insufficient documentation

## 2021-07-04 DIAGNOSIS — D631 Anemia in chronic kidney disease: Secondary | ICD-10-CM | POA: Diagnosis not present

## 2021-07-04 DIAGNOSIS — N179 Acute kidney failure, unspecified: Secondary | ICD-10-CM | POA: Insufficient documentation

## 2021-07-04 LAB — CBC
HCT: 30.5 % — ABNORMAL LOW (ref 36.0–46.0)
Hemoglobin: 9.7 g/dL — ABNORMAL LOW (ref 12.0–15.0)
MCH: 31.5 pg (ref 26.0–34.0)
MCHC: 31.8 g/dL (ref 30.0–36.0)
MCV: 99 fL (ref 80.0–100.0)
Platelets: 127 10*3/uL — ABNORMAL LOW (ref 150–400)
RBC: 3.08 MIL/uL — ABNORMAL LOW (ref 3.87–5.11)
RDW: 12.4 % (ref 11.5–15.5)
WBC: 8.3 10*3/uL (ref 4.0–10.5)
nRBC: 0 % (ref 0.0–0.2)

## 2021-07-04 LAB — RENAL FUNCTION PANEL
Albumin: 3.8 g/dL (ref 3.5–5.0)
Anion gap: 12 (ref 5–15)
BUN: 94 mg/dL — ABNORMAL HIGH (ref 8–23)
CO2: 19 mmol/L — ABNORMAL LOW (ref 22–32)
Calcium: 9.7 mg/dL (ref 8.9–10.3)
Chloride: 106 mmol/L (ref 98–111)
Creatinine, Ser: 3.48 mg/dL — ABNORMAL HIGH (ref 0.44–1.00)
GFR, Estimated: 13 mL/min — ABNORMAL LOW (ref >60)
Glucose, Bld: 119 mg/dL — ABNORMAL HIGH (ref 70–99)
Phosphorus: 5.5 mg/dL — ABNORMAL HIGH (ref 2.5–4.6)
Potassium: 4.5 mmol/L (ref 3.5–5.1)
Sodium: 137 mmol/L (ref 135–145)

## 2021-07-04 LAB — POCT HEMOGLOBIN-HEMACUE: Hemoglobin: 9.8 g/dL — ABNORMAL LOW (ref 12.0–15.0)

## 2021-07-04 LAB — DIFFERENTIAL
Abs Immature Granulocytes: 0.03 10*3/uL (ref 0.00–0.07)
Basophils Absolute: 0 10*3/uL (ref 0.0–0.1)
Basophils Relative: 0 %
Eosinophils Absolute: 0.2 10*3/uL (ref 0.0–0.5)
Eosinophils Relative: 3 %
Immature Granulocytes: 0 %
Lymphocytes Relative: 28 %
Lymphs Abs: 2.3 10*3/uL (ref 0.7–4.0)
Monocytes Absolute: 0.7 10*3/uL (ref 0.1–1.0)
Monocytes Relative: 8 %
Neutro Abs: 5.1 10*3/uL (ref 1.7–7.7)
Neutrophils Relative %: 61 %

## 2021-07-04 LAB — MAGNESIUM: Magnesium: 1.9 mg/dL (ref 1.7–2.4)

## 2021-07-04 LAB — IRON AND TIBC
Iron: 88 ug/dL (ref 28–170)
Saturation Ratios: 32 % — ABNORMAL HIGH (ref 10.4–31.8)
TIBC: 272 ug/dL (ref 250–450)
UIBC: 184 ug/dL

## 2021-07-04 LAB — FERRITIN: Ferritin: 512 ng/mL — ABNORMAL HIGH (ref 11–307)

## 2021-07-04 MED ORDER — EPOETIN ALFA-EPBX 10000 UNIT/ML IJ SOLN
INTRAMUSCULAR | Status: AC
  Filled 2021-07-04: qty 1

## 2021-07-04 MED ORDER — EPOETIN ALFA-EPBX 10000 UNIT/ML IJ SOLN
10000.0000 [IU] | INTRAMUSCULAR | Status: DC
  Administered 2021-07-04: 10000 [IU] via SUBCUTANEOUS

## 2021-07-18 ENCOUNTER — Encounter (HOSPITAL_COMMUNITY)
Admission: RE | Admit: 2021-07-18 | Discharge: 2021-07-18 | Disposition: A | Discharge status: Home / Self Care | Payer: Medicare HMO | Source: Ambulatory Visit | Attending: Nephrology | Admitting: Nephrology

## 2021-07-18 ENCOUNTER — Other Ambulatory Visit: Payer: Self-pay

## 2021-07-18 VITALS — BP 137/48 | HR 63 | Temp 98.3°F | Resp 20

## 2021-07-18 DIAGNOSIS — N179 Acute kidney failure, unspecified: Secondary | ICD-10-CM | POA: Diagnosis not present

## 2021-07-18 DIAGNOSIS — D631 Anemia in chronic kidney disease: Secondary | ICD-10-CM

## 2021-07-18 DIAGNOSIS — N189 Chronic kidney disease, unspecified: Secondary | ICD-10-CM | POA: Diagnosis not present

## 2021-07-18 LAB — POCT HEMOGLOBIN-HEMACUE: Hemoglobin: 9.6 g/dL — ABNORMAL LOW (ref 12.0–15.0)

## 2021-07-18 MED ORDER — EPOETIN ALFA-EPBX 10000 UNIT/ML IJ SOLN
INTRAMUSCULAR | Status: AC
  Administered 2021-07-18: 10000 [IU] via SUBCUTANEOUS
  Filled 2021-07-18: qty 1

## 2021-07-18 MED ORDER — EPOETIN ALFA-EPBX 10000 UNIT/ML IJ SOLN: 10000.0000 [IU] | INTRAMUSCULAR | Status: DC

## 2021-07-30 ENCOUNTER — Other Ambulatory Visit: Payer: Self-pay | Admitting: Family Medicine

## 2021-08-01 ENCOUNTER — Encounter (HOSPITAL_COMMUNITY)
Admission: RE | Admit: 2021-08-01 | Discharge: 2021-08-01 | Disposition: A | Discharge status: Home / Self Care | Payer: Medicare HMO | Source: Ambulatory Visit | Attending: Nephrology | Admitting: Nephrology

## 2021-08-01 VITALS — BP 129/54 | HR 63

## 2021-08-01 DIAGNOSIS — N179 Acute kidney failure, unspecified: Secondary | ICD-10-CM | POA: Insufficient documentation

## 2021-08-01 DIAGNOSIS — D631 Anemia in chronic kidney disease: Secondary | ICD-10-CM

## 2021-08-01 DIAGNOSIS — N189 Chronic kidney disease, unspecified: Secondary | ICD-10-CM | POA: Diagnosis not present

## 2021-08-01 LAB — CBC
HCT: 31.5 % — ABNORMAL LOW (ref 36.0–46.0)
Hemoglobin: 9.9 g/dL — ABNORMAL LOW (ref 12.0–15.0)
MCH: 31.4 pg (ref 26.0–34.0)
MCHC: 31.4 g/dL (ref 30.0–36.0)
MCV: 100 fL (ref 80.0–100.0)
Platelets: 152 10*3/uL (ref 150–400)
RBC: 3.15 MIL/uL — ABNORMAL LOW (ref 3.87–5.11)
RDW: 12.7 % (ref 11.5–15.5)
WBC: 8.7 10*3/uL (ref 4.0–10.5)
nRBC: 0 % (ref 0.0–0.2)

## 2021-08-01 LAB — DIFFERENTIAL
Abs Immature Granulocytes: 0.03 10*3/uL (ref 0.00–0.07)
Basophils Absolute: 0 10*3/uL (ref 0.0–0.1)
Basophils Relative: 1 %
Eosinophils Absolute: 0.2 10*3/uL (ref 0.0–0.5)
Eosinophils Relative: 3 %
Immature Granulocytes: 0 %
Lymphocytes Relative: 21 %
Lymphs Abs: 1.8 10*3/uL (ref 0.7–4.0)
Monocytes Absolute: 0.6 10*3/uL (ref 0.1–1.0)
Monocytes Relative: 7 %
Neutro Abs: 5.9 10*3/uL (ref 1.7–7.7)
Neutrophils Relative %: 68 %

## 2021-08-01 LAB — RENAL FUNCTION PANEL
Albumin: 3.7 g/dL (ref 3.5–5.0)
Anion gap: 9 (ref 5–15)
BUN: 99 mg/dL — ABNORMAL HIGH (ref 8–23)
CO2: 20 mmol/L — ABNORMAL LOW (ref 22–32)
Calcium: 9.7 mg/dL (ref 8.9–10.3)
Chloride: 110 mmol/L (ref 98–111)
Creatinine, Ser: 3.29 mg/dL — ABNORMAL HIGH (ref 0.44–1.00)
GFR, Estimated: 14 mL/min — ABNORMAL LOW (ref >60)
Glucose, Bld: 91 mg/dL (ref 70–99)
Phosphorus: 4.8 mg/dL — ABNORMAL HIGH (ref 2.5–4.6)
Potassium: 4.8 mmol/L (ref 3.5–5.1)
Sodium: 139 mmol/L (ref 135–145)

## 2021-08-01 LAB — IRON AND TIBC
Iron: 85 ug/dL (ref 28–170)
Saturation Ratios: 31 % (ref 10.4–31.8)
TIBC: 276 ug/dL (ref 250–450)
UIBC: 191 ug/dL

## 2021-08-01 LAB — MAGNESIUM: Magnesium: 1.9 mg/dL (ref 1.7–2.4)

## 2021-08-01 LAB — FERRITIN: Ferritin: 447 ng/mL — ABNORMAL HIGH (ref 11–307)

## 2021-08-01 MED ORDER — EPOETIN ALFA-EPBX 10000 UNIT/ML IJ SOLN
10000.0000 [IU] | INTRAMUSCULAR | Status: DC
  Administered 2021-08-01: 10000 [IU] via SUBCUTANEOUS

## 2021-08-01 MED ORDER — EPOETIN ALFA-EPBX 10000 UNIT/ML IJ SOLN
INTRAMUSCULAR | Status: AC
  Filled 2021-08-01: qty 1

## 2021-08-02 LAB — POCT HEMOGLOBIN-HEMACUE: Hemoglobin: 9.8 g/dL — ABNORMAL LOW (ref 12.0–15.0)

## 2021-08-15 ENCOUNTER — Other Ambulatory Visit: Payer: Self-pay

## 2021-08-15 ENCOUNTER — Encounter (HOSPITAL_COMMUNITY)
Admission: RE | Admit: 2021-08-15 | Discharge: 2021-08-15 | Disposition: A | Discharge status: Home / Self Care | Payer: Medicare HMO | Source: Ambulatory Visit | Attending: Nephrology | Admitting: Nephrology

## 2021-08-15 VITALS — BP 153/61 | HR 62 | Resp 20

## 2021-08-15 DIAGNOSIS — N189 Chronic kidney disease, unspecified: Secondary | ICD-10-CM | POA: Diagnosis not present

## 2021-08-15 DIAGNOSIS — N179 Acute kidney failure, unspecified: Secondary | ICD-10-CM | POA: Diagnosis not present

## 2021-08-15 DIAGNOSIS — D631 Anemia in chronic kidney disease: Secondary | ICD-10-CM | POA: Diagnosis not present

## 2021-08-15 LAB — POCT HEMOGLOBIN-HEMACUE: Hemoglobin: 9.4 g/dL — ABNORMAL LOW (ref 12.0–15.0)

## 2021-08-15 MED ORDER — EPOETIN ALFA-EPBX 10000 UNIT/ML IJ SOLN
10000.0000 [IU] | INTRAMUSCULAR | Status: DC
  Administered 2021-08-15: 10000 [IU] via SUBCUTANEOUS

## 2021-08-15 MED ORDER — EPOETIN ALFA-EPBX 10000 UNIT/ML IJ SOLN
INTRAMUSCULAR | Status: AC
  Filled 2021-08-15: qty 1

## 2021-08-29 ENCOUNTER — Other Ambulatory Visit: Payer: Self-pay

## 2021-08-29 ENCOUNTER — Encounter (HOSPITAL_COMMUNITY)
Admission: RE | Admit: 2021-08-29 | Discharge: 2021-08-29 | Disposition: A | Discharge status: Home / Self Care | Payer: Medicare HMO | Source: Ambulatory Visit | Attending: Nephrology | Admitting: Nephrology

## 2021-08-29 VITALS — BP 160/60 | HR 80 | Temp 97.4°F | Resp 20

## 2021-08-29 DIAGNOSIS — D631 Anemia in chronic kidney disease: Secondary | ICD-10-CM | POA: Insufficient documentation

## 2021-08-29 DIAGNOSIS — N179 Acute kidney failure, unspecified: Secondary | ICD-10-CM | POA: Insufficient documentation

## 2021-08-29 DIAGNOSIS — N189 Chronic kidney disease, unspecified: Secondary | ICD-10-CM | POA: Insufficient documentation

## 2021-08-29 LAB — DIFFERENTIAL
Abs Immature Granulocytes: 0.03 10*3/uL (ref 0.00–0.07)
Basophils Absolute: 0 10*3/uL (ref 0.0–0.1)
Basophils Relative: 0 %
Eosinophils Absolute: 0.2 10*3/uL (ref 0.0–0.5)
Eosinophils Relative: 2 %
Immature Granulocytes: 0 %
Lymphocytes Relative: 22 %
Lymphs Abs: 2.1 10*3/uL (ref 0.7–4.0)
Monocytes Absolute: 0.7 10*3/uL (ref 0.1–1.0)
Monocytes Relative: 7 %
Neutro Abs: 6.5 10*3/uL (ref 1.7–7.7)
Neutrophils Relative %: 69 %

## 2021-08-29 LAB — IRON AND TIBC
Iron: 83 ug/dL (ref 28–170)
Saturation Ratios: 29 % (ref 10.4–31.8)
TIBC: 281 ug/dL (ref 250–450)
UIBC: 198 ug/dL

## 2021-08-29 LAB — RENAL FUNCTION PANEL
Albumin: 3.8 g/dL (ref 3.5–5.0)
Anion gap: 10 (ref 5–15)
BUN: 106 mg/dL — ABNORMAL HIGH (ref 8–23)
CO2: 18 mmol/L — ABNORMAL LOW (ref 22–32)
Calcium: 9.4 mg/dL (ref 8.9–10.3)
Chloride: 109 mmol/L (ref 98–111)
Creatinine, Ser: 3.56 mg/dL — ABNORMAL HIGH (ref 0.44–1.00)
GFR, Estimated: 13 mL/min — ABNORMAL LOW (ref >60)
Glucose, Bld: 102 mg/dL — ABNORMAL HIGH (ref 70–99)
Phosphorus: 4.9 mg/dL — ABNORMAL HIGH (ref 2.5–4.6)
Potassium: 4.6 mmol/L (ref 3.5–5.1)
Sodium: 137 mmol/L (ref 135–145)

## 2021-08-29 LAB — POCT HEMOGLOBIN-HEMACUE: Hemoglobin: 9.6 g/dL — ABNORMAL LOW (ref 12.0–15.0)

## 2021-08-29 LAB — CBC
HCT: 29.8 % — ABNORMAL LOW (ref 36.0–46.0)
Hemoglobin: 9.5 g/dL — ABNORMAL LOW (ref 12.0–15.0)
MCH: 31.1 pg (ref 26.0–34.0)
MCHC: 31.9 g/dL (ref 30.0–36.0)
MCV: 97.7 fL (ref 80.0–100.0)
Platelets: 143 10*3/uL — ABNORMAL LOW (ref 150–400)
RBC: 3.05 MIL/uL — ABNORMAL LOW (ref 3.87–5.11)
RDW: 12.4 % (ref 11.5–15.5)
WBC: 9.7 10*3/uL (ref 4.0–10.5)
nRBC: 0 % (ref 0.0–0.2)

## 2021-08-29 LAB — FERRITIN: Ferritin: 414 ng/mL — ABNORMAL HIGH (ref 11–307)

## 2021-08-29 LAB — MAGNESIUM: Magnesium: 1.8 mg/dL (ref 1.7–2.4)

## 2021-08-29 MED ORDER — EPOETIN ALFA-EPBX 10000 UNIT/ML IJ SOLN
INTRAMUSCULAR | Status: AC
  Filled 2021-08-29: qty 1

## 2021-08-29 MED ORDER — EPOETIN ALFA-EPBX 10000 UNIT/ML IJ SOLN
10000.0000 [IU] | INTRAMUSCULAR | Status: DC
  Administered 2021-08-29: 10000 [IU] via SUBCUTANEOUS

## 2021-09-12 ENCOUNTER — Other Ambulatory Visit: Payer: Self-pay

## 2021-09-12 ENCOUNTER — Encounter (HOSPITAL_COMMUNITY)
Admission: RE | Admit: 2021-09-12 | Discharge: 2021-09-12 | Disposition: A | Discharge status: Home / Self Care | Payer: Medicare HMO | Source: Ambulatory Visit | Attending: Nephrology | Admitting: Nephrology

## 2021-09-12 VITALS — BP 162/64 | HR 71 | Temp 97.3°F | Resp 20

## 2021-09-12 DIAGNOSIS — D631 Anemia in chronic kidney disease: Secondary | ICD-10-CM | POA: Diagnosis not present

## 2021-09-12 DIAGNOSIS — N179 Acute kidney failure, unspecified: Secondary | ICD-10-CM | POA: Diagnosis not present

## 2021-09-12 DIAGNOSIS — N189 Chronic kidney disease, unspecified: Secondary | ICD-10-CM | POA: Diagnosis not present

## 2021-09-12 LAB — POCT HEMOGLOBIN-HEMACUE: Hemoglobin: 9.2 g/dL — ABNORMAL LOW (ref 12.0–15.0)

## 2021-09-12 MED ORDER — EPOETIN ALFA-EPBX 10000 UNIT/ML IJ SOLN
INTRAMUSCULAR | Status: AC
  Filled 2021-09-12: qty 1

## 2021-09-12 MED ORDER — EPOETIN ALFA-EPBX 10000 UNIT/ML IJ SOLN
10000.0000 [IU] | INTRAMUSCULAR | Status: DC
  Administered 2021-09-12: 10000 [IU] via SUBCUTANEOUS

## 2021-09-13 ENCOUNTER — Ambulatory Visit (INDEPENDENT_AMBULATORY_CARE_PROVIDER_SITE_OTHER): Payer: Medicare HMO | Admitting: Family Medicine

## 2021-09-13 ENCOUNTER — Encounter: Payer: Self-pay | Admitting: Family Medicine

## 2021-09-13 ENCOUNTER — Other Ambulatory Visit: Payer: Self-pay

## 2021-09-13 VITALS — BP 166/62 | HR 75 | Wt 183.0 lb

## 2021-09-13 DIAGNOSIS — R040 Epistaxis: Secondary | ICD-10-CM

## 2021-09-13 DIAGNOSIS — R399 Unspecified symptoms and signs involving the genitourinary system: Secondary | ICD-10-CM

## 2021-09-13 DIAGNOSIS — I10 Essential (primary) hypertension: Secondary | ICD-10-CM | POA: Diagnosis not present

## 2021-09-13 LAB — POCT URINALYSIS DIP (MANUAL ENTRY)
Bilirubin, UA: NEGATIVE
Blood, UA: NEGATIVE
Glucose, UA: NEGATIVE mg/dL
Ketones, POC UA: NEGATIVE mg/dL
Nitrite, UA: NEGATIVE
Protein Ur, POC: 30 mg/dL — AB
Spec Grav, UA: 1.015 (ref 1.010–1.025)
Urobilinogen, UA: 0.2 E.U./dL
pH, UA: 5 (ref 5.0–8.0)

## 2021-09-13 LAB — POCT UA - MICROSCOPIC ONLY

## 2021-09-13 NOTE — Progress Notes (Signed)
    SUBJECTIVE:   CHIEF COMPLAINT / HPI:   Recurrent nose bleed- for the last four months she has had one per month, not been picking nose, did start Flonase several months ago. They will stop in 15 minutes. She does clean out her nose once they stop. It is her right nose that bleeds. She takes aspirin but is not on any blood thinners.  HTN - she notes her blood pressure "always runs this high." She follows with nephrology for her CKD. Notes she is also taking spironolactone but unsure the dose. Took all her medications today. No headaches, vision changes, chest pain, shortness of breath.  Increased urinary frequency- she feels like she has been urinating more recently, but drinking plenty of water, no back pain, no nausea or vomiting or fevers or chills. No dysuria or blood in urine. No foul smell to urine.  PERTINENT  PMH / PSH: HTN, T2DM, anemia, chronic kidney disease stage 4  OBJECTIVE:   BP (!) 166/62   Pulse 75   Wt 183 lb (83 kg)   LMP  (LMP Unknown)   BMI 32.42 kg/m   HEENT: nares clear, without obvious scab or vessel as sign of bleeding. Abd: no suprapubic tenderness, No CVA tenderness,  Neuro: CN II-XII intact, strength and sensation intact bilaterally  BP 166/62 on repeat  ASSESSMENT/PLAN:   Resistant hypertension - BP remains elevated and not at goal today, she is on max dose of her medications and has allergies to amlodipine and clonidine- discussed with her calling her nephrologist which she will do to see her dose of spironolactone and if he has other recommendations for better control - asymptomatic today with normal neuro exam- discussed ED precautions  UTI symptoms - patient notes increased frequency, no other UTI symptoms, UA with leukocytes but also many epithelial cells, will send for culture - no signs of pyelonephritis, discussed ED return precautions  Frequent nosebleeds - no obvious sign of bleeding, discussed discontinuing Flonase, humidifier in room,  with shared decision making she would like to start a referral to ENT - discussed proper technique for nosebleed management including 10 min continuous pressure and if not stopping in 15 min to go to ED     Lenoria Chime, MD [place]

## 2021-09-13 NOTE — Patient Instructions (Addendum)
It was wonderful to see you today.  Please bring ALL of your medications with you to every visit.   Today we talked about:  -Start use a humidifier and do not use her Flonase anymore, I will place referral to ear nose and throat, and we have reviewed the best way to hold pressure on your nose to stop the nosebleed, do not forget that you should hold for 5 to 10 minutes of continuous pressure before releasing unchecking and if bleeding for greater than 15 minutes please go to urgent care or the ED -Blood pressure remains elevated today, I would discuss with your nephrologist as you are already on the maximum dose of most medications or have allergies to others - If you have chest pain, vision changes, or a severe headache with elevated blood pressure please go to the ED - Your urinalysis today showed some white blood cells, but no bacteria, we will send a culture and I will call you if it is growing anything, I do not see any signs of kidney infection or pyelonephritis on exam with you today if you start having fevers, chills or worsening back pain please go to the ED -Schedule follow-up in 1 month to follow-up on other chronic issues   Thank you for choosing [facility].   Please call [phone] with any questions about today's appointment.  Please be sure to schedule follow up at the front  desk before you leave today.   Yehuda Savannah, MD  Family Medicine

## 2021-09-15 DIAGNOSIS — R399 Unspecified symptoms and signs involving the genitourinary system: Secondary | ICD-10-CM | POA: Insufficient documentation

## 2021-09-15 DIAGNOSIS — R040 Epistaxis: Secondary | ICD-10-CM | POA: Insufficient documentation

## 2021-09-15 HISTORY — DX: Epistaxis: R04.0

## 2021-09-15 NOTE — Assessment & Plan Note (Addendum)
-   BP remains elevated and not at goal today, she is on max dose of her medications and has allergies to amlodipine and clonidine- discussed with her calling her nephrologist which she will do to see her dose of spironolactone and if he has other recommendations for better control - asymptomatic today with normal neuro exam- discussed ED precautions

## 2021-09-15 NOTE — Assessment & Plan Note (Signed)
-   no obvious sign of bleeding, discussed discontinuing Flonase, humidifier in room, with shared decision making she would like to start a referral to ENT - discussed proper technique for nosebleed management including 10 min continuous pressure and if not stopping in 15 min to go to ED

## 2021-09-15 NOTE — Assessment & Plan Note (Signed)
-   patient notes increased frequency, no other UTI symptoms, UA with leukocytes but also many epithelial cells, will send for culture - no signs of pyelonephritis, discussed ED return precautions

## 2021-09-17 LAB — URINE CULTURE

## 2021-09-20 DIAGNOSIS — I120 Hypertensive chronic kidney disease with stage 5 chronic kidney disease or end stage renal disease: Secondary | ICD-10-CM | POA: Diagnosis not present

## 2021-09-20 DIAGNOSIS — M792 Neuralgia and neuritis, unspecified: Secondary | ICD-10-CM | POA: Diagnosis not present

## 2021-09-20 DIAGNOSIS — N185 Chronic kidney disease, stage 5: Secondary | ICD-10-CM | POA: Diagnosis not present

## 2021-09-20 DIAGNOSIS — D631 Anemia in chronic kidney disease: Secondary | ICD-10-CM | POA: Diagnosis not present

## 2021-09-20 DIAGNOSIS — E1122 Type 2 diabetes mellitus with diabetic chronic kidney disease: Secondary | ICD-10-CM | POA: Diagnosis not present

## 2021-09-20 DIAGNOSIS — N2581 Secondary hyperparathyroidism of renal origin: Secondary | ICD-10-CM | POA: Diagnosis not present

## 2021-09-26 ENCOUNTER — Other Ambulatory Visit: Payer: Self-pay

## 2021-09-26 ENCOUNTER — Encounter (HOSPITAL_COMMUNITY)
Admission: RE | Admit: 2021-09-26 | Discharge: 2021-09-26 | Disposition: A | Discharge status: Home / Self Care | Payer: Medicare HMO | Source: Ambulatory Visit | Attending: Nephrology | Admitting: Nephrology

## 2021-09-26 VITALS — BP 165/65 | HR 70 | Temp 97.4°F | Resp 20

## 2021-09-26 DIAGNOSIS — N189 Chronic kidney disease, unspecified: Secondary | ICD-10-CM | POA: Insufficient documentation

## 2021-09-26 DIAGNOSIS — N179 Acute kidney failure, unspecified: Secondary | ICD-10-CM | POA: Diagnosis not present

## 2021-09-26 DIAGNOSIS — D631 Anemia in chronic kidney disease: Secondary | ICD-10-CM | POA: Insufficient documentation

## 2021-09-26 LAB — DIFFERENTIAL
Abs Immature Granulocytes: 0.05 10*3/uL (ref 0.00–0.07)
Basophils Absolute: 0.1 10*3/uL (ref 0.0–0.1)
Basophils Relative: 1 %
Eosinophils Absolute: 0.3 10*3/uL (ref 0.0–0.5)
Eosinophils Relative: 3 %
Immature Granulocytes: 1 %
Lymphocytes Relative: 24 %
Lymphs Abs: 2.5 10*3/uL (ref 0.7–4.0)
Monocytes Absolute: 0.7 10*3/uL (ref 0.1–1.0)
Monocytes Relative: 7 %
Neutro Abs: 6.9 10*3/uL (ref 1.7–7.7)
Neutrophils Relative %: 64 %

## 2021-09-26 LAB — CBC
HCT: 30.1 % — ABNORMAL LOW (ref 36.0–46.0)
Hemoglobin: 9.4 g/dL — ABNORMAL LOW (ref 12.0–15.0)
MCH: 30.8 pg (ref 26.0–34.0)
MCHC: 31.2 g/dL (ref 30.0–36.0)
MCV: 98.7 fL (ref 80.0–100.0)
Platelets: 149 10*3/uL — ABNORMAL LOW (ref 150–400)
RBC: 3.05 MIL/uL — ABNORMAL LOW (ref 3.87–5.11)
RDW: 12.6 % (ref 11.5–15.5)
WBC: 10.5 10*3/uL (ref 4.0–10.5)
nRBC: 0 % (ref 0.0–0.2)

## 2021-09-26 LAB — RENAL FUNCTION PANEL
Albumin: 3.7 g/dL (ref 3.5–5.0)
Anion gap: 9 (ref 5–15)
BUN: 84 mg/dL — ABNORMAL HIGH (ref 8–23)
CO2: 18 mmol/L — ABNORMAL LOW (ref 22–32)
Calcium: 9.3 mg/dL (ref 8.9–10.3)
Chloride: 110 mmol/L (ref 98–111)
Creatinine, Ser: 2.84 mg/dL — ABNORMAL HIGH (ref 0.44–1.00)
GFR, Estimated: 17 mL/min — ABNORMAL LOW
Glucose, Bld: 82 mg/dL (ref 70–99)
Phosphorus: 4.3 mg/dL (ref 2.5–4.6)
Potassium: 4.8 mmol/L (ref 3.5–5.1)
Sodium: 137 mmol/L (ref 135–145)

## 2021-09-26 LAB — FERRITIN: Ferritin: 413 ng/mL — ABNORMAL HIGH (ref 11–307)

## 2021-09-26 LAB — POCT HEMOGLOBIN-HEMACUE: Hemoglobin: 9.6 g/dL — ABNORMAL LOW (ref 12.0–15.0)

## 2021-09-26 LAB — IRON AND TIBC
Iron: 93 ug/dL (ref 28–170)
Saturation Ratios: 33 % — ABNORMAL HIGH (ref 10.4–31.8)
TIBC: 283 ug/dL (ref 250–450)
UIBC: 190 ug/dL

## 2021-09-26 LAB — MAGNESIUM: Magnesium: 1.7 mg/dL (ref 1.7–2.4)

## 2021-09-26 MED ORDER — EPOETIN ALFA-EPBX 10000 UNIT/ML IJ SOLN
INTRAMUSCULAR | Status: AC
  Administered 2021-09-26: 10000 [IU] via SUBCUTANEOUS
  Filled 2021-09-26: qty 1

## 2021-09-26 MED ORDER — EPOETIN ALFA-EPBX 10000 UNIT/ML IJ SOLN: 10000.0000 [IU] | INTRAMUSCULAR | Status: DC

## 2021-10-10 ENCOUNTER — Other Ambulatory Visit: Payer: Self-pay

## 2021-10-10 ENCOUNTER — Encounter (HOSPITAL_COMMUNITY)
Admission: RE | Admit: 2021-10-10 | Discharge: 2021-10-10 | Disposition: A | Discharge status: Home / Self Care | Payer: Medicare HMO | Source: Ambulatory Visit | Attending: Nephrology | Admitting: Nephrology

## 2021-10-10 VITALS — BP 147/53 | HR 60 | Temp 97.8°F | Resp 20

## 2021-10-10 DIAGNOSIS — D631 Anemia in chronic kidney disease: Secondary | ICD-10-CM | POA: Diagnosis not present

## 2021-10-10 DIAGNOSIS — N179 Acute kidney failure, unspecified: Secondary | ICD-10-CM | POA: Diagnosis not present

## 2021-10-10 DIAGNOSIS — N189 Chronic kidney disease, unspecified: Secondary | ICD-10-CM | POA: Diagnosis not present

## 2021-10-10 LAB — POCT HEMOGLOBIN-HEMACUE: Hemoglobin: 9 g/dL — ABNORMAL LOW (ref 12.0–15.0)

## 2021-10-10 MED ORDER — EPOETIN ALFA-EPBX 10000 UNIT/ML IJ SOLN
10000.0000 [IU] | INTRAMUSCULAR | Status: DC
  Administered 2021-10-10: 10000 [IU] via SUBCUTANEOUS

## 2021-10-10 MED ORDER — EPOETIN ALFA-EPBX 10000 UNIT/ML IJ SOLN
INTRAMUSCULAR | Status: AC
  Filled 2021-10-10: qty 1

## 2021-10-24 ENCOUNTER — Encounter (HOSPITAL_COMMUNITY)
Admission: RE | Admit: 2021-10-24 | Discharge: 2021-10-24 | Disposition: A | Discharge status: Home / Self Care | Payer: Medicare HMO | Source: Ambulatory Visit | Attending: Nephrology | Admitting: Nephrology

## 2021-10-24 VITALS — BP 170/63 | HR 72 | Temp 97.2°F | Resp 18

## 2021-10-24 DIAGNOSIS — N189 Chronic kidney disease, unspecified: Secondary | ICD-10-CM

## 2021-10-24 DIAGNOSIS — D631 Anemia in chronic kidney disease: Secondary | ICD-10-CM | POA: Diagnosis not present

## 2021-10-24 DIAGNOSIS — N179 Acute kidney failure, unspecified: Secondary | ICD-10-CM | POA: Diagnosis not present

## 2021-10-24 LAB — POCT HEMOGLOBIN-HEMACUE: Hemoglobin: 9.4 g/dL — ABNORMAL LOW (ref 12.0–15.0)

## 2021-10-24 MED ORDER — EPOETIN ALFA-EPBX 10000 UNIT/ML IJ SOLN
10000.0000 [IU] | INTRAMUSCULAR | Status: DC
  Administered 2021-10-24: 10000 [IU] via SUBCUTANEOUS

## 2021-10-30 ENCOUNTER — Other Ambulatory Visit: Payer: Self-pay | Admitting: Family Medicine

## 2021-11-07 ENCOUNTER — Encounter (HOSPITAL_COMMUNITY)
Admission: RE | Admit: 2021-11-07 | Discharge: 2021-11-07 | Disposition: A | Discharge status: Home / Self Care | Payer: Medicare HMO | Source: Ambulatory Visit | Attending: Nephrology | Admitting: Nephrology

## 2021-11-07 ENCOUNTER — Other Ambulatory Visit: Payer: Self-pay

## 2021-11-07 VITALS — BP 152/59 | HR 65 | Temp 98.1°F

## 2021-11-07 DIAGNOSIS — N179 Acute kidney failure, unspecified: Secondary | ICD-10-CM | POA: Diagnosis not present

## 2021-11-07 DIAGNOSIS — D631 Anemia in chronic kidney disease: Secondary | ICD-10-CM | POA: Diagnosis not present

## 2021-11-07 DIAGNOSIS — N189 Chronic kidney disease, unspecified: Secondary | ICD-10-CM | POA: Insufficient documentation

## 2021-11-07 LAB — IRON AND TIBC
Iron: 109 ug/dL (ref 28–170)
Saturation Ratios: 38 % — ABNORMAL HIGH (ref 10.4–31.8)
TIBC: 284 ug/dL (ref 250–450)
UIBC: 175 ug/dL

## 2021-11-07 LAB — RENAL FUNCTION PANEL
Albumin: 3.5 g/dL (ref 3.5–5.0)
Anion gap: 9 (ref 5–15)
BUN: 76 mg/dL — ABNORMAL HIGH (ref 8–23)
CO2: 23 mmol/L (ref 22–32)
Calcium: 9.7 mg/dL (ref 8.9–10.3)
Chloride: 108 mmol/L (ref 98–111)
Creatinine, Ser: 3.12 mg/dL — ABNORMAL HIGH (ref 0.44–1.00)
GFR, Estimated: 15 mL/min — ABNORMAL LOW (ref >60)
Glucose, Bld: 60 mg/dL — ABNORMAL LOW (ref 70–99)
Phosphorus: 4.4 mg/dL (ref 2.5–4.6)
Potassium: 4.4 mmol/L (ref 3.5–5.1)
Sodium: 140 mmol/L (ref 135–145)

## 2021-11-07 LAB — DIFFERENTIAL
Abs Immature Granulocytes: 0.03 10*3/uL (ref 0.00–0.07)
Basophils Absolute: 0.1 10*3/uL (ref 0.0–0.1)
Basophils Relative: 1 %
Eosinophils Absolute: 0.3 10*3/uL (ref 0.0–0.5)
Eosinophils Relative: 3 %
Immature Granulocytes: 0 %
Lymphocytes Relative: 30 %
Lymphs Abs: 2.4 10*3/uL (ref 0.7–4.0)
Monocytes Absolute: 0.6 10*3/uL (ref 0.1–1.0)
Monocytes Relative: 8 %
Neutro Abs: 4.6 10*3/uL (ref 1.7–7.7)
Neutrophils Relative %: 58 %

## 2021-11-07 LAB — CBC
HCT: 29.7 % — ABNORMAL LOW (ref 36.0–46.0)
Hemoglobin: 9.4 g/dL — ABNORMAL LOW (ref 12.0–15.0)
MCH: 31 pg (ref 26.0–34.0)
MCHC: 31.6 g/dL (ref 30.0–36.0)
MCV: 98 fL (ref 80.0–100.0)
Platelets: 147 10*3/uL — ABNORMAL LOW (ref 150–400)
RBC: 3.03 MIL/uL — ABNORMAL LOW (ref 3.87–5.11)
RDW: 12.2 % (ref 11.5–15.5)
WBC: 8 10*3/uL (ref 4.0–10.5)
nRBC: 0 % (ref 0.0–0.2)

## 2021-11-07 LAB — MAGNESIUM: Magnesium: 1.7 mg/dL (ref 1.7–2.4)

## 2021-11-07 LAB — POCT HEMOGLOBIN-HEMACUE: Hemoglobin: 9.3 g/dL — ABNORMAL LOW (ref 12.0–15.0)

## 2021-11-07 LAB — FERRITIN: Ferritin: 395 ng/mL — ABNORMAL HIGH (ref 11–307)

## 2021-11-07 MED ORDER — EPOETIN ALFA-EPBX 10000 UNIT/ML IJ SOLN
10000.0000 [IU] | INTRAMUSCULAR | Status: DC
  Administered 2021-11-07: 10000 [IU] via SUBCUTANEOUS

## 2021-11-07 MED ORDER — EPOETIN ALFA-EPBX 10000 UNIT/ML IJ SOLN
INTRAMUSCULAR | Status: AC
  Filled 2021-11-07: qty 1

## 2021-11-21 ENCOUNTER — Encounter (HOSPITAL_COMMUNITY)
Admission: RE | Admit: 2021-11-21 | Discharge: 2021-11-21 | Disposition: A | Discharge status: Home / Self Care | Payer: Medicare HMO | Source: Ambulatory Visit | Attending: Nephrology | Admitting: Nephrology

## 2021-11-21 ENCOUNTER — Other Ambulatory Visit: Payer: Self-pay

## 2021-11-21 VITALS — BP 163/56 | HR 67 | Temp 97.2°F | Resp 18

## 2021-11-21 DIAGNOSIS — D631 Anemia in chronic kidney disease: Secondary | ICD-10-CM | POA: Diagnosis not present

## 2021-11-21 DIAGNOSIS — N189 Chronic kidney disease, unspecified: Secondary | ICD-10-CM

## 2021-11-21 DIAGNOSIS — N179 Acute kidney failure, unspecified: Secondary | ICD-10-CM | POA: Diagnosis not present

## 2021-11-21 LAB — POCT HEMOGLOBIN-HEMACUE: Hemoglobin: 9.2 g/dL — ABNORMAL LOW (ref 12.0–15.0)

## 2021-11-21 MED ORDER — EPOETIN ALFA-EPBX 10000 UNIT/ML IJ SOLN
INTRAMUSCULAR | Status: AC
  Filled 2021-11-21: qty 1

## 2021-11-21 MED ORDER — EPOETIN ALFA-EPBX 10000 UNIT/ML IJ SOLN
10000.0000 [IU] | INTRAMUSCULAR | Status: DC
  Administered 2021-11-21: 14:00:00 10000 [IU] via SUBCUTANEOUS

## 2021-12-05 ENCOUNTER — Encounter (HOSPITAL_COMMUNITY): Payer: Medicare HMO

## 2021-12-05 ENCOUNTER — Other Ambulatory Visit: Payer: Self-pay

## 2021-12-05 ENCOUNTER — Encounter (HOSPITAL_COMMUNITY)
Admission: RE | Admit: 2021-12-05 | Discharge: 2021-12-05 | Disposition: A | Discharge status: Home / Self Care | Payer: Medicare HMO | Source: Ambulatory Visit | Attending: Nephrology | Admitting: Nephrology

## 2021-12-05 VITALS — BP 157/66 | HR 67 | Temp 96.8°F

## 2021-12-05 DIAGNOSIS — D631 Anemia in chronic kidney disease: Secondary | ICD-10-CM | POA: Diagnosis not present

## 2021-12-05 DIAGNOSIS — N179 Acute kidney failure, unspecified: Secondary | ICD-10-CM | POA: Insufficient documentation

## 2021-12-05 DIAGNOSIS — N189 Chronic kidney disease, unspecified: Secondary | ICD-10-CM | POA: Diagnosis not present

## 2021-12-05 DIAGNOSIS — E7800 Pure hypercholesterolemia, unspecified: Secondary | ICD-10-CM

## 2021-12-05 LAB — IRON AND TIBC
Iron: 84 ug/dL (ref 28–170)
Saturation Ratios: 30 % (ref 10.4–31.8)
TIBC: 280 ug/dL (ref 250–450)
UIBC: 196 ug/dL

## 2021-12-05 LAB — DIFFERENTIAL
Abs Immature Granulocytes: 0.03 10*3/uL (ref 0.00–0.07)
Basophils Absolute: 0.1 10*3/uL (ref 0.0–0.1)
Basophils Relative: 1 %
Eosinophils Absolute: 0.3 10*3/uL (ref 0.0–0.5)
Eosinophils Relative: 3 %
Immature Granulocytes: 0 %
Lymphocytes Relative: 27 %
Lymphs Abs: 2.4 10*3/uL (ref 0.7–4.0)
Monocytes Absolute: 0.7 10*3/uL (ref 0.1–1.0)
Monocytes Relative: 8 %
Neutro Abs: 5.5 10*3/uL (ref 1.7–7.7)
Neutrophils Relative %: 61 %

## 2021-12-05 LAB — RENAL FUNCTION PANEL
Albumin: 3.8 g/dL (ref 3.5–5.0)
Anion gap: 10 (ref 5–15)
BUN: 75 mg/dL — ABNORMAL HIGH (ref 8–23)
CO2: 24 mmol/L (ref 22–32)
Calcium: 9.3 mg/dL (ref 8.9–10.3)
Chloride: 106 mmol/L (ref 98–111)
Creatinine, Ser: 3.32 mg/dL — ABNORMAL HIGH (ref 0.44–1.00)
GFR, Estimated: 14 mL/min — ABNORMAL LOW (ref >60)
Glucose, Bld: 79 mg/dL (ref 70–99)
Phosphorus: 4.8 mg/dL — ABNORMAL HIGH (ref 2.5–4.6)
Potassium: 4.7 mmol/L (ref 3.5–5.1)
Sodium: 140 mmol/L (ref 135–145)

## 2021-12-05 LAB — CBC
HCT: 30.6 % — ABNORMAL LOW (ref 36.0–46.0)
Hemoglobin: 9.5 g/dL — ABNORMAL LOW (ref 12.0–15.0)
MCH: 30.6 pg (ref 26.0–34.0)
MCHC: 31 g/dL (ref 30.0–36.0)
MCV: 98.7 fL (ref 80.0–100.0)
Platelets: 143 10*3/uL — ABNORMAL LOW (ref 150–400)
RBC: 3.1 MIL/uL — ABNORMAL LOW (ref 3.87–5.11)
RDW: 12.5 % (ref 11.5–15.5)
WBC: 9 10*3/uL (ref 4.0–10.5)
nRBC: 0 % (ref 0.0–0.2)

## 2021-12-05 LAB — POCT HEMOGLOBIN-HEMACUE: Hemoglobin: 9.4 g/dL — ABNORMAL LOW (ref 12.0–15.0)

## 2021-12-05 LAB — MAGNESIUM: Magnesium: 1.8 mg/dL (ref 1.7–2.4)

## 2021-12-05 LAB — FERRITIN: Ferritin: 409 ng/mL — ABNORMAL HIGH (ref 11–307)

## 2021-12-05 MED ORDER — EPOETIN ALFA-EPBX 10000 UNIT/ML IJ SOLN
INTRAMUSCULAR | Status: AC
  Filled 2021-12-05: qty 1

## 2021-12-05 MED ORDER — EPOETIN ALFA-EPBX 10000 UNIT/ML IJ SOLN
10000.0000 [IU] | INTRAMUSCULAR | Status: DC
  Administered 2021-12-05: 10000 [IU] via SUBCUTANEOUS

## 2021-12-05 MED ORDER — "INSULIN SYRINGE-NEEDLE U-100 31G X 5/16"" 1 ML MISC": 11 refills | Status: DC

## 2021-12-05 MED ORDER — ATORVASTATIN CALCIUM 40 MG PO TABS: ORAL_TABLET | ORAL | 3 refills | Status: DC

## 2021-12-16 ENCOUNTER — Encounter: Payer: Self-pay | Admitting: Family Medicine

## 2021-12-16 DIAGNOSIS — E1122 Type 2 diabetes mellitus with diabetic chronic kidney disease: Secondary | ICD-10-CM

## 2021-12-16 DIAGNOSIS — Z794 Long term (current) use of insulin: Secondary | ICD-10-CM

## 2021-12-18 MED ORDER — GLUCOSE BLOOD VI STRP: ORAL_STRIP | 3 refills | Status: DC

## 2021-12-19 ENCOUNTER — Encounter (HOSPITAL_COMMUNITY)
Admission: RE | Admit: 2021-12-19 | Discharge: 2021-12-19 | Disposition: A | Discharge status: Home / Self Care | Payer: Medicare HMO | Source: Ambulatory Visit | Attending: Nephrology | Admitting: Nephrology

## 2021-12-19 ENCOUNTER — Other Ambulatory Visit: Payer: Self-pay

## 2021-12-19 VITALS — BP 166/63 | HR 70 | Temp 97.0°F | Resp 18

## 2021-12-19 DIAGNOSIS — D631 Anemia in chronic kidney disease: Secondary | ICD-10-CM

## 2021-12-19 DIAGNOSIS — N179 Acute kidney failure, unspecified: Secondary | ICD-10-CM

## 2021-12-19 DIAGNOSIS — N189 Chronic kidney disease, unspecified: Secondary | ICD-10-CM | POA: Diagnosis not present

## 2021-12-19 LAB — POCT HEMOGLOBIN-HEMACUE: Hemoglobin: 9.5 g/dL — ABNORMAL LOW (ref 12.0–15.0)

## 2021-12-19 MED ORDER — EPOETIN ALFA-EPBX 10000 UNIT/ML IJ SOLN
10000.0000 [IU] | INTRAMUSCULAR | Status: DC
  Administered 2021-12-19: 14:00:00 10000 [IU] via SUBCUTANEOUS

## 2021-12-19 MED ORDER — EPOETIN ALFA-EPBX 10000 UNIT/ML IJ SOLN
INTRAMUSCULAR | Status: AC
  Filled 2021-12-19: qty 1

## 2021-12-20 DIAGNOSIS — N2581 Secondary hyperparathyroidism of renal origin: Secondary | ICD-10-CM | POA: Diagnosis not present

## 2021-12-20 DIAGNOSIS — N185 Chronic kidney disease, stage 5: Secondary | ICD-10-CM | POA: Diagnosis not present

## 2021-12-20 DIAGNOSIS — D631 Anemia in chronic kidney disease: Secondary | ICD-10-CM | POA: Diagnosis not present

## 2021-12-20 DIAGNOSIS — I120 Hypertensive chronic kidney disease with stage 5 chronic kidney disease or end stage renal disease: Secondary | ICD-10-CM | POA: Diagnosis not present

## 2021-12-27 MED ORDER — ACCU-CHEK GUIDE ME W/DEVICE KIT: PACK | 0 refills | Status: DC

## 2021-12-27 MED ORDER — ACCU-CHEK SOFTCLIX LANCETS MISC: 12 refills | Status: DC

## 2021-12-27 MED ORDER — ACCU-CHEK GUIDE VI STRP: ORAL_STRIP | 12 refills | Status: DC

## 2022-01-02 ENCOUNTER — Encounter: Payer: Self-pay | Admitting: Family Medicine

## 2022-01-02 ENCOUNTER — Other Ambulatory Visit: Payer: Self-pay

## 2022-01-02 ENCOUNTER — Encounter (HOSPITAL_COMMUNITY)
Admission: RE | Admit: 2022-01-02 | Discharge: 2022-01-02 | Disposition: A | Discharge status: Home / Self Care | Payer: Medicare HMO | Source: Ambulatory Visit | Attending: Nephrology | Admitting: Nephrology

## 2022-01-02 VITALS — BP 151/50 | HR 70 | Temp 98.5°F | Resp 18

## 2022-01-02 DIAGNOSIS — D631 Anemia in chronic kidney disease: Secondary | ICD-10-CM | POA: Insufficient documentation

## 2022-01-02 DIAGNOSIS — N189 Chronic kidney disease, unspecified: Secondary | ICD-10-CM | POA: Insufficient documentation

## 2022-01-02 DIAGNOSIS — N179 Acute kidney failure, unspecified: Secondary | ICD-10-CM | POA: Insufficient documentation

## 2022-01-02 LAB — FERRITIN: Ferritin: 338 ng/mL — ABNORMAL HIGH (ref 11–307)

## 2022-01-02 LAB — CBC
HCT: 29.5 % — ABNORMAL LOW (ref 36.0–46.0)
Hemoglobin: 9.1 g/dL — ABNORMAL LOW (ref 12.0–15.0)
MCH: 30.3 pg (ref 26.0–34.0)
MCHC: 30.8 g/dL (ref 30.0–36.0)
MCV: 98.3 fL (ref 80.0–100.0)
Platelets: 139 10*3/uL — ABNORMAL LOW (ref 150–400)
RBC: 3 MIL/uL — ABNORMAL LOW (ref 3.87–5.11)
RDW: 12.6 % (ref 11.5–15.5)
WBC: 9 10*3/uL (ref 4.0–10.5)
nRBC: 0 % (ref 0.0–0.2)

## 2022-01-02 LAB — MAGNESIUM: Magnesium: 1.9 mg/dL (ref 1.7–2.4)

## 2022-01-02 LAB — RENAL FUNCTION PANEL
Albumin: 3.8 g/dL (ref 3.5–5.0)
Anion gap: 11 (ref 5–15)
BUN: 116 mg/dL — ABNORMAL HIGH (ref 8–23)
CO2: 19 mmol/L — ABNORMAL LOW (ref 22–32)
Calcium: 9.3 mg/dL (ref 8.9–10.3)
Chloride: 108 mmol/L (ref 98–111)
Creatinine, Ser: 4.14 mg/dL — ABNORMAL HIGH (ref 0.44–1.00)
GFR, Estimated: 10 mL/min — ABNORMAL LOW (ref >60)
Glucose, Bld: 92 mg/dL (ref 70–99)
Phosphorus: 5.4 mg/dL — ABNORMAL HIGH (ref 2.5–4.6)
Potassium: 5 mmol/L (ref 3.5–5.1)
Sodium: 138 mmol/L (ref 135–145)

## 2022-01-02 LAB — DIFFERENTIAL
Abs Immature Granulocytes: 0.02 10*3/uL (ref 0.00–0.07)
Basophils Absolute: 0 10*3/uL (ref 0.0–0.1)
Basophils Relative: 0 %
Eosinophils Absolute: 0.2 10*3/uL (ref 0.0–0.5)
Eosinophils Relative: 3 %
Immature Granulocytes: 0 %
Lymphocytes Relative: 30 %
Lymphs Abs: 2.7 10*3/uL (ref 0.7–4.0)
Monocytes Absolute: 0.6 10*3/uL (ref 0.1–1.0)
Monocytes Relative: 7 %
Neutro Abs: 5.5 10*3/uL (ref 1.7–7.7)
Neutrophils Relative %: 60 %

## 2022-01-02 LAB — IRON AND TIBC
Iron: 81 ug/dL (ref 28–170)
Saturation Ratios: 28 % (ref 10.4–31.8)
TIBC: 287 ug/dL (ref 250–450)
UIBC: 206 ug/dL

## 2022-01-02 LAB — POCT HEMOGLOBIN-HEMACUE: Hemoglobin: 9.5 g/dL — ABNORMAL LOW (ref 12.0–15.0)

## 2022-01-02 MED ORDER — EPOETIN ALFA-EPBX 10000 UNIT/ML IJ SOLN
INTRAMUSCULAR | Status: AC
  Administered 2022-01-02: 10000 [IU] via SUBCUTANEOUS
  Filled 2022-01-02: qty 1

## 2022-01-02 MED ORDER — EPOETIN ALFA-EPBX 10000 UNIT/ML IJ SOLN: 10000.0000 [IU] | INTRAMUSCULAR | Status: DC

## 2022-01-12 MED ORDER — ACCU-CHEK GUIDE VI STRP: ORAL_STRIP | 12 refills | Status: DC

## 2022-01-12 MED ORDER — ACCU-CHEK SOFTCLIX LANCETS MISC: 12 refills | Status: DC

## 2022-01-12 MED ORDER — ACCU-CHEK GUIDE ME W/DEVICE KIT: PACK | 0 refills | Status: AC

## 2022-01-16 ENCOUNTER — Encounter (HOSPITAL_COMMUNITY)
Admission: RE | Admit: 2022-01-16 | Discharge: 2022-01-16 | Disposition: A | Discharge status: Home / Self Care | Payer: Medicare HMO | Source: Ambulatory Visit | Attending: Nephrology | Admitting: Nephrology

## 2022-01-16 VITALS — BP 143/50 | HR 67 | Temp 97.4°F

## 2022-01-16 DIAGNOSIS — N189 Chronic kidney disease, unspecified: Secondary | ICD-10-CM | POA: Diagnosis not present

## 2022-01-16 DIAGNOSIS — N179 Acute kidney failure, unspecified: Secondary | ICD-10-CM | POA: Diagnosis not present

## 2022-01-16 DIAGNOSIS — D631 Anemia in chronic kidney disease: Secondary | ICD-10-CM

## 2022-01-16 MED ORDER — EPOETIN ALFA-EPBX 10000 UNIT/ML IJ SOLN
10000.0000 [IU] | INTRAMUSCULAR | Status: DC
  Administered 2022-01-16: 10000 [IU] via SUBCUTANEOUS

## 2022-01-16 MED ORDER — EPOETIN ALFA-EPBX 10000 UNIT/ML IJ SOLN
INTRAMUSCULAR | Status: DC
Start: 2022-01-16 — End: 2022-01-17
  Filled 2022-01-16: qty 1

## 2022-01-17 LAB — POCT HEMOGLOBIN-HEMACUE: Hemoglobin: 8.8 g/dL — ABNORMAL LOW (ref 12.0–15.0)

## 2022-01-22 ENCOUNTER — Encounter (HOSPITAL_COMMUNITY): Payer: Self-pay

## 2022-01-22 ENCOUNTER — Telehealth: Payer: Self-pay

## 2022-01-22 ENCOUNTER — Other Ambulatory Visit (HOSPITAL_COMMUNITY): Payer: Self-pay

## 2022-01-22 NOTE — Telephone Encounter (Signed)
A Prior Authorization was initiated for this patients ACCU-CHEK GUIDE TEST STRIPS through CoverMyMeds.   Key: [id number]

## 2022-01-23 NOTE — Telephone Encounter (Signed)
Prior Auth for patients medication ACCU-CHEK GUIDE TEST STRIPS approved by [employer] until 11/25/22.  Key: [id number]  Approval letter added to pt's chart.

## 2022-01-30 ENCOUNTER — Encounter (HOSPITAL_COMMUNITY)
Admission: RE | Admit: 2022-01-30 | Discharge: 2022-01-30 | Disposition: A | Discharge status: Home / Self Care | Payer: Medicare HMO | Source: Ambulatory Visit | Attending: Nephrology | Admitting: Nephrology

## 2022-01-30 ENCOUNTER — Other Ambulatory Visit: Payer: Self-pay

## 2022-01-30 VITALS — BP 161/63 | HR 68 | Temp 97.8°F | Resp 18

## 2022-01-30 DIAGNOSIS — D631 Anemia in chronic kidney disease: Secondary | ICD-10-CM | POA: Insufficient documentation

## 2022-01-30 DIAGNOSIS — N189 Chronic kidney disease, unspecified: Secondary | ICD-10-CM | POA: Insufficient documentation

## 2022-01-30 DIAGNOSIS — N179 Acute kidney failure, unspecified: Secondary | ICD-10-CM | POA: Diagnosis not present

## 2022-01-30 LAB — RENAL FUNCTION PANEL
Albumin: 3.5 g/dL (ref 3.5–5.0)
Anion gap: 8 (ref 5–15)
BUN: 67 mg/dL — ABNORMAL HIGH (ref 8–23)
CO2: 22 mmol/L (ref 22–32)
Calcium: 9.2 mg/dL (ref 8.9–10.3)
Chloride: 110 mmol/L (ref 98–111)
Creatinine, Ser: 3.03 mg/dL — ABNORMAL HIGH (ref 0.44–1.00)
GFR, Estimated: 15 mL/min — ABNORMAL LOW (ref >60)
Glucose, Bld: 93 mg/dL (ref 70–99)
Phosphorus: 4.6 mg/dL (ref 2.5–4.6)
Potassium: 4.5 mmol/L (ref 3.5–5.1)
Sodium: 140 mmol/L (ref 135–145)

## 2022-01-30 LAB — DIFFERENTIAL
Abs Immature Granulocytes: 0.03 10*3/uL (ref 0.00–0.07)
Basophils Absolute: 0 10*3/uL (ref 0.0–0.1)
Basophils Relative: 1 %
Eosinophils Absolute: 0.2 10*3/uL (ref 0.0–0.5)
Eosinophils Relative: 2 %
Immature Granulocytes: 0 %
Lymphocytes Relative: 24 %
Lymphs Abs: 2.2 10*3/uL (ref 0.7–4.0)
Monocytes Absolute: 0.7 10*3/uL (ref 0.1–1.0)
Monocytes Relative: 7 %
Neutro Abs: 5.7 10*3/uL (ref 1.7–7.7)
Neutrophils Relative %: 66 %

## 2022-01-30 LAB — CBC
HCT: 28.6 % — ABNORMAL LOW (ref 36.0–46.0)
Hemoglobin: 8.8 g/dL — ABNORMAL LOW (ref 12.0–15.0)
MCH: 31 pg (ref 26.0–34.0)
MCHC: 30.8 g/dL (ref 30.0–36.0)
MCV: 100.7 fL — ABNORMAL HIGH (ref 80.0–100.0)
Platelets: 130 10*3/uL — ABNORMAL LOW (ref 150–400)
RBC: 2.84 MIL/uL — ABNORMAL LOW (ref 3.87–5.11)
RDW: 12.8 % (ref 11.5–15.5)
WBC: 8.8 10*3/uL (ref 4.0–10.5)
nRBC: 0 % (ref 0.0–0.2)

## 2022-01-30 LAB — MAGNESIUM: Magnesium: 1.6 mg/dL — ABNORMAL LOW (ref 1.7–2.4)

## 2022-01-30 LAB — FERRITIN: Ferritin: 386 ng/mL — ABNORMAL HIGH (ref 11–307)

## 2022-01-30 LAB — POCT HEMOGLOBIN-HEMACUE: Hemoglobin: 9.1 g/dL — ABNORMAL LOW (ref 12.0–15.0)

## 2022-01-30 LAB — IRON AND TIBC
Iron: 69 ug/dL (ref 28–170)
Saturation Ratios: 27 % (ref 10.4–31.8)
TIBC: 253 ug/dL (ref 250–450)
UIBC: 184 ug/dL

## 2022-01-30 MED ORDER — EPOETIN ALFA-EPBX 10000 UNIT/ML IJ SOLN
INTRAMUSCULAR | Status: AC
  Filled 2022-01-30: qty 1

## 2022-01-30 MED ORDER — EPOETIN ALFA-EPBX 10000 UNIT/ML IJ SOLN
10000.0000 [IU] | INTRAMUSCULAR | Status: DC
  Administered 2022-01-30: 10000 [IU] via SUBCUTANEOUS

## 2022-01-31 ENCOUNTER — Encounter: Payer: Self-pay | Admitting: Family Medicine

## 2022-01-31 MED ORDER — FUROSEMIDE 40 MG PO TABS: 40.0000 mg | ORAL_TABLET | Freq: Every day | ORAL | 3 refills | Status: DC

## 2022-02-08 ENCOUNTER — Other Ambulatory Visit: Payer: Self-pay | Admitting: Family Medicine

## 2022-02-08 ENCOUNTER — Encounter: Payer: Self-pay | Admitting: Family Medicine

## 2022-02-08 MED ORDER — OSELTAMIVIR PHOSPHATE 75 MG PO CAPS: 75.0000 mg | ORAL_CAPSULE | Freq: Every day | ORAL | 0 refills | Status: AC

## 2022-02-13 ENCOUNTER — Other Ambulatory Visit: Payer: Self-pay

## 2022-02-13 ENCOUNTER — Encounter (HOSPITAL_COMMUNITY)
Admission: RE | Admit: 2022-02-13 | Discharge: 2022-02-13 | Disposition: A | Discharge status: Home / Self Care | Payer: Medicare HMO | Source: Ambulatory Visit | Attending: Nephrology | Admitting: Nephrology

## 2022-02-13 VITALS — BP 161/51 | HR 68 | Temp 97.1°F | Resp 18

## 2022-02-13 DIAGNOSIS — D631 Anemia in chronic kidney disease: Secondary | ICD-10-CM

## 2022-02-13 DIAGNOSIS — N179 Acute kidney failure, unspecified: Secondary | ICD-10-CM | POA: Diagnosis not present

## 2022-02-13 DIAGNOSIS — N189 Chronic kidney disease, unspecified: Secondary | ICD-10-CM | POA: Diagnosis not present

## 2022-02-13 LAB — POCT HEMOGLOBIN-HEMACUE: Hemoglobin: 9.4 g/dL — ABNORMAL LOW (ref 12.0–15.0)

## 2022-02-13 MED ORDER — EPOETIN ALFA-EPBX 10000 UNIT/ML IJ SOLN
10000.0000 [IU] | INTRAMUSCULAR | Status: DC
  Administered 2022-02-13: 10000 [IU] via SUBCUTANEOUS

## 2022-02-13 MED ORDER — EPOETIN ALFA-EPBX 10000 UNIT/ML IJ SOLN
INTRAMUSCULAR | Status: AC
  Filled 2022-02-13: qty 1

## 2022-02-27 ENCOUNTER — Encounter (HOSPITAL_COMMUNITY)
Admission: RE | Admit: 2022-02-27 | Discharge: 2022-02-27 | Disposition: A | Discharge status: Home / Self Care | Payer: Medicare HMO | Source: Ambulatory Visit | Attending: Nephrology | Admitting: Nephrology

## 2022-02-27 VITALS — BP 148/57 | HR 65 | Temp 97.6°F | Resp 18

## 2022-02-27 DIAGNOSIS — N179 Acute kidney failure, unspecified: Secondary | ICD-10-CM | POA: Insufficient documentation

## 2022-02-27 DIAGNOSIS — D631 Anemia in chronic kidney disease: Secondary | ICD-10-CM | POA: Insufficient documentation

## 2022-02-27 DIAGNOSIS — N189 Chronic kidney disease, unspecified: Secondary | ICD-10-CM | POA: Diagnosis not present

## 2022-02-27 LAB — IRON AND TIBC
Iron: 93 ug/dL (ref 28–170)
Saturation Ratios: 37 % — ABNORMAL HIGH (ref 10.4–31.8)
TIBC: 249 ug/dL — ABNORMAL LOW (ref 250–450)
UIBC: 156 ug/dL

## 2022-02-27 LAB — CBC
HCT: 26.8 % — ABNORMAL LOW (ref 36.0–46.0)
Hemoglobin: 8.5 g/dL — ABNORMAL LOW (ref 12.0–15.0)
MCH: 31 pg (ref 26.0–34.0)
MCHC: 31.7 g/dL (ref 30.0–36.0)
MCV: 97.8 fL (ref 80.0–100.0)
Platelets: 134 10*3/uL — ABNORMAL LOW (ref 150–400)
RBC: 2.74 MIL/uL — ABNORMAL LOW (ref 3.87–5.11)
RDW: 12.7 % (ref 11.5–15.5)
WBC: 7.6 10*3/uL (ref 4.0–10.5)
nRBC: 0 % (ref 0.0–0.2)

## 2022-02-27 LAB — DIFFERENTIAL
Abs Immature Granulocytes: 0.02 10*3/uL (ref 0.00–0.07)
Basophils Absolute: 0 10*3/uL (ref 0.0–0.1)
Basophils Relative: 1 %
Eosinophils Absolute: 0.2 10*3/uL (ref 0.0–0.5)
Eosinophils Relative: 3 %
Immature Granulocytes: 0 %
Lymphocytes Relative: 30 %
Lymphs Abs: 2.3 10*3/uL (ref 0.7–4.0)
Monocytes Absolute: 0.6 10*3/uL (ref 0.1–1.0)
Monocytes Relative: 7 %
Neutro Abs: 4.5 10*3/uL (ref 1.7–7.7)
Neutrophils Relative %: 59 %

## 2022-02-27 LAB — RENAL FUNCTION PANEL
Albumin: 3.6 g/dL (ref 3.5–5.0)
Anion gap: 11 (ref 5–15)
BUN: 75 mg/dL — ABNORMAL HIGH (ref 8–23)
CO2: 21 mmol/L — ABNORMAL LOW (ref 22–32)
Calcium: 9.1 mg/dL (ref 8.9–10.3)
Chloride: 110 mmol/L (ref 98–111)
Creatinine, Ser: 3.18 mg/dL — ABNORMAL HIGH (ref 0.44–1.00)
GFR, Estimated: 14 mL/min — ABNORMAL LOW (ref >60)
Glucose, Bld: 98 mg/dL (ref 70–99)
Phosphorus: 4.2 mg/dL (ref 2.5–4.6)
Potassium: 4.1 mmol/L (ref 3.5–5.1)
Sodium: 142 mmol/L (ref 135–145)

## 2022-02-27 LAB — FERRITIN: Ferritin: 332 ng/mL — ABNORMAL HIGH (ref 11–307)

## 2022-02-27 LAB — MAGNESIUM: Magnesium: 1.6 mg/dL — ABNORMAL LOW (ref 1.7–2.4)

## 2022-02-27 LAB — POCT HEMOGLOBIN-HEMACUE: Hemoglobin: 8.9 g/dL — ABNORMAL LOW (ref 12.0–15.0)

## 2022-02-27 MED ORDER — EPOETIN ALFA-EPBX 10000 UNIT/ML IJ SOLN
10000.0000 [IU] | INTRAMUSCULAR | Status: DC
  Administered 2022-02-27: 10000 [IU] via SUBCUTANEOUS

## 2022-02-27 MED ORDER — EPOETIN ALFA-EPBX 10000 UNIT/ML IJ SOLN
INTRAMUSCULAR | Status: AC
  Filled 2022-02-27: qty 1

## 2022-03-13 ENCOUNTER — Encounter (HOSPITAL_COMMUNITY)
Admission: RE | Admit: 2022-03-13 | Discharge: 2022-03-13 | Disposition: A | Discharge status: Home / Self Care | Payer: Medicare HMO | Source: Ambulatory Visit | Attending: Nephrology | Admitting: Nephrology

## 2022-03-13 VITALS — BP 165/52 | HR 71 | Temp 97.4°F | Resp 18

## 2022-03-13 DIAGNOSIS — D631 Anemia in chronic kidney disease: Secondary | ICD-10-CM | POA: Diagnosis not present

## 2022-03-13 DIAGNOSIS — N179 Acute kidney failure, unspecified: Secondary | ICD-10-CM | POA: Diagnosis not present

## 2022-03-13 DIAGNOSIS — N189 Chronic kidney disease, unspecified: Secondary | ICD-10-CM | POA: Diagnosis not present

## 2022-03-13 LAB — POCT HEMOGLOBIN-HEMACUE: Hemoglobin: 8.8 g/dL — ABNORMAL LOW (ref 12.0–15.0)

## 2022-03-13 MED ORDER — EPOETIN ALFA-EPBX 10000 UNIT/ML IJ SOLN
10000.0000 [IU] | INTRAMUSCULAR | Status: DC
  Administered 2022-03-13: 10000 [IU] via SUBCUTANEOUS

## 2022-03-13 MED ORDER — EPOETIN ALFA-EPBX 10000 UNIT/ML IJ SOLN
INTRAMUSCULAR | Status: AC
  Filled 2022-03-13: qty 1

## 2022-03-16 ENCOUNTER — Other Ambulatory Visit: Payer: Self-pay

## 2022-03-16 MED ORDER — "INSULIN SYRINGE-NEEDLE U-100 31G X 5/16"" 1 ML MISC": 11 refills | Status: DC

## 2022-03-20 DIAGNOSIS — N185 Chronic kidney disease, stage 5: Secondary | ICD-10-CM | POA: Diagnosis not present

## 2022-03-20 DIAGNOSIS — I120 Hypertensive chronic kidney disease with stage 5 chronic kidney disease or end stage renal disease: Secondary | ICD-10-CM | POA: Diagnosis not present

## 2022-03-20 DIAGNOSIS — N2581 Secondary hyperparathyroidism of renal origin: Secondary | ICD-10-CM | POA: Diagnosis not present

## 2022-03-20 DIAGNOSIS — D631 Anemia in chronic kidney disease: Secondary | ICD-10-CM | POA: Diagnosis not present

## 2022-03-27 ENCOUNTER — Encounter (HOSPITAL_COMMUNITY)
Admission: RE | Admit: 2022-03-27 | Discharge: 2022-03-27 | Disposition: A | Discharge status: Home / Self Care | Payer: Medicare HMO | Source: Ambulatory Visit | Attending: Nephrology | Admitting: Nephrology

## 2022-03-27 VITALS — BP 154/47 | HR 73 | Temp 97.6°F | Resp 18

## 2022-03-27 DIAGNOSIS — N189 Chronic kidney disease, unspecified: Secondary | ICD-10-CM | POA: Diagnosis not present

## 2022-03-27 DIAGNOSIS — D631 Anemia in chronic kidney disease: Secondary | ICD-10-CM | POA: Diagnosis not present

## 2022-03-27 DIAGNOSIS — N179 Acute kidney failure, unspecified: Secondary | ICD-10-CM | POA: Insufficient documentation

## 2022-03-27 LAB — DIFFERENTIAL
Abs Immature Granulocytes: 0.02 10*3/uL (ref 0.00–0.07)
Basophils Absolute: 0 10*3/uL (ref 0.0–0.1)
Basophils Relative: 1 %
Eosinophils Absolute: 0.2 10*3/uL (ref 0.0–0.5)
Eosinophils Relative: 3 %
Immature Granulocytes: 0 %
Lymphocytes Relative: 25 %
Lymphs Abs: 2.1 10*3/uL (ref 0.7–4.0)
Monocytes Absolute: 0.6 10*3/uL (ref 0.1–1.0)
Monocytes Relative: 7 %
Neutro Abs: 5.3 10*3/uL (ref 1.7–7.7)
Neutrophils Relative %: 64 %

## 2022-03-27 LAB — CBC
HCT: 28.5 % — ABNORMAL LOW (ref 36.0–46.0)
Hemoglobin: 9.1 g/dL — ABNORMAL LOW (ref 12.0–15.0)
MCH: 31.5 pg (ref 26.0–34.0)
MCHC: 31.9 g/dL (ref 30.0–36.0)
MCV: 98.6 fL (ref 80.0–100.0)
Platelets: 135 10*3/uL — ABNORMAL LOW (ref 150–400)
RBC: 2.89 MIL/uL — ABNORMAL LOW (ref 3.87–5.11)
RDW: 13 % (ref 11.5–15.5)
WBC: 8.3 10*3/uL (ref 4.0–10.5)
nRBC: 0 % (ref 0.0–0.2)

## 2022-03-27 LAB — RENAL FUNCTION PANEL
Albumin: 3.6 g/dL (ref 3.5–5.0)
Anion gap: 10 (ref 5–15)
BUN: 76 mg/dL — ABNORMAL HIGH (ref 8–23)
CO2: 24 mmol/L (ref 22–32)
Calcium: 9.3 mg/dL (ref 8.9–10.3)
Chloride: 108 mmol/L (ref 98–111)
Creatinine, Ser: 3.44 mg/dL — ABNORMAL HIGH (ref 0.44–1.00)
GFR, Estimated: 13 mL/min — ABNORMAL LOW (ref >60)
Glucose, Bld: 121 mg/dL — ABNORMAL HIGH (ref 70–99)
Phosphorus: 4.5 mg/dL (ref 2.5–4.6)
Potassium: 4.2 mmol/L (ref 3.5–5.1)
Sodium: 142 mmol/L (ref 135–145)

## 2022-03-27 LAB — IRON AND TIBC
Iron: 74 ug/dL (ref 28–170)
Saturation Ratios: 29 % (ref 10.4–31.8)
TIBC: 258 ug/dL (ref 250–450)
UIBC: 184 ug/dL

## 2022-03-27 LAB — POCT HEMOGLOBIN-HEMACUE: Hemoglobin: 9 g/dL — ABNORMAL LOW (ref 12.0–15.0)

## 2022-03-27 LAB — MAGNESIUM: Magnesium: 1.7 mg/dL (ref 1.7–2.4)

## 2022-03-27 LAB — FERRITIN: Ferritin: 385 ng/mL — ABNORMAL HIGH (ref 11–307)

## 2022-03-27 MED ORDER — EPOETIN ALFA-EPBX 10000 UNIT/ML IJ SOLN
INTRAMUSCULAR | Status: AC
  Filled 2022-03-27: qty 2

## 2022-03-27 MED ORDER — EPOETIN ALFA-EPBX 10000 UNIT/ML IJ SOLN
20000.0000 [IU] | INTRAMUSCULAR | Status: DC
  Administered 2022-03-27: 20000 [IU] via SUBCUTANEOUS

## 2022-04-10 ENCOUNTER — Encounter (HOSPITAL_COMMUNITY)
Admission: RE | Admit: 2022-04-10 | Discharge: 2022-04-10 | Disposition: A | Discharge status: Home / Self Care | Payer: Medicare HMO | Source: Ambulatory Visit | Attending: Nephrology | Admitting: Nephrology

## 2022-04-10 VITALS — BP 143/52 | HR 70 | Temp 97.5°F | Resp 16

## 2022-04-10 DIAGNOSIS — N179 Acute kidney failure, unspecified: Secondary | ICD-10-CM | POA: Diagnosis not present

## 2022-04-10 DIAGNOSIS — D631 Anemia in chronic kidney disease: Secondary | ICD-10-CM | POA: Diagnosis not present

## 2022-04-10 DIAGNOSIS — N189 Chronic kidney disease, unspecified: Secondary | ICD-10-CM | POA: Diagnosis not present

## 2022-04-10 LAB — POCT HEMOGLOBIN-HEMACUE: Hemoglobin: 9.3 g/dL — ABNORMAL LOW (ref 12.0–15.0)

## 2022-04-10 MED ORDER — EPOETIN ALFA-EPBX 10000 UNIT/ML IJ SOLN
20000.0000 [IU] | INTRAMUSCULAR | Status: DC
  Administered 2022-04-10: 20000 [IU] via SUBCUTANEOUS

## 2022-04-10 MED ORDER — EPOETIN ALFA-EPBX 10000 UNIT/ML IJ SOLN
INTRAMUSCULAR | Status: AC
  Filled 2022-04-10: qty 2

## 2022-04-24 ENCOUNTER — Encounter (HOSPITAL_COMMUNITY)
Admission: RE | Admit: 2022-04-24 | Discharge: 2022-04-24 | Disposition: A | Discharge status: Home / Self Care | Payer: Medicare HMO | Source: Ambulatory Visit | Attending: Nephrology | Admitting: Nephrology

## 2022-04-24 VITALS — BP 152/61 | HR 74 | Temp 97.3°F | Resp 18

## 2022-04-24 DIAGNOSIS — N179 Acute kidney failure, unspecified: Secondary | ICD-10-CM

## 2022-04-24 DIAGNOSIS — D631 Anemia in chronic kidney disease: Secondary | ICD-10-CM

## 2022-04-24 DIAGNOSIS — N189 Chronic kidney disease, unspecified: Secondary | ICD-10-CM | POA: Diagnosis not present

## 2022-04-24 LAB — POCT HEMOGLOBIN-HEMACUE: Hemoglobin: 10 g/dL — ABNORMAL LOW (ref 12.0–15.0)

## 2022-04-24 MED ORDER — EPOETIN ALFA-EPBX 10000 UNIT/ML IJ SOLN
20000.0000 [IU] | INTRAMUSCULAR | Status: DC
  Administered 2022-04-24: 20000 [IU] via SUBCUTANEOUS

## 2022-04-24 MED ORDER — EPOETIN ALFA-EPBX 10000 UNIT/ML IJ SOLN
INTRAMUSCULAR | Status: AC
  Filled 2022-04-24: qty 2

## 2022-05-01 ENCOUNTER — Encounter: Payer: Self-pay | Admitting: *Deleted

## 2022-05-07 ENCOUNTER — Encounter (HOSPITAL_COMMUNITY): Payer: Medicare HMO

## 2022-05-08 ENCOUNTER — Encounter (HOSPITAL_COMMUNITY)
Admission: RE | Admit: 2022-05-08 | Discharge: 2022-05-08 | Disposition: A | Discharge status: Home / Self Care | Payer: Medicare HMO | Source: Ambulatory Visit | Attending: Nephrology | Admitting: Nephrology

## 2022-05-08 VITALS — BP 157/55 | HR 65 | Temp 97.1°F | Resp 17

## 2022-05-08 DIAGNOSIS — N189 Chronic kidney disease, unspecified: Secondary | ICD-10-CM | POA: Diagnosis not present

## 2022-05-08 DIAGNOSIS — N179 Acute kidney failure, unspecified: Secondary | ICD-10-CM | POA: Insufficient documentation

## 2022-05-08 DIAGNOSIS — D631 Anemia in chronic kidney disease: Secondary | ICD-10-CM | POA: Diagnosis not present

## 2022-05-08 LAB — RENAL FUNCTION PANEL
Albumin: 3.6 g/dL (ref 3.5–5.0)
Anion gap: 10 (ref 5–15)
BUN: 90 mg/dL — ABNORMAL HIGH (ref 8–23)
CO2: 24 mmol/L (ref 22–32)
Calcium: 9.5 mg/dL (ref 8.9–10.3)
Chloride: 105 mmol/L (ref 98–111)
Creatinine, Ser: 3.88 mg/dL — ABNORMAL HIGH (ref 0.44–1.00)
GFR, Estimated: 11 mL/min — ABNORMAL LOW (ref >60)
Glucose, Bld: 101 mg/dL — ABNORMAL HIGH (ref 70–99)
Phosphorus: 4.9 mg/dL — ABNORMAL HIGH (ref 2.5–4.6)
Potassium: 4.7 mmol/L (ref 3.5–5.1)
Sodium: 139 mmol/L (ref 135–145)

## 2022-05-08 LAB — CBC
HCT: 31.5 % — ABNORMAL LOW (ref 36.0–46.0)
Hemoglobin: 9.9 g/dL — ABNORMAL LOW (ref 12.0–15.0)
MCH: 31.2 pg (ref 26.0–34.0)
MCHC: 31.4 g/dL (ref 30.0–36.0)
MCV: 99.4 fL (ref 80.0–100.0)
Platelets: 155 10*3/uL (ref 150–400)
RBC: 3.17 MIL/uL — ABNORMAL LOW (ref 3.87–5.11)
RDW: 13.2 % (ref 11.5–15.5)
WBC: 8.7 10*3/uL (ref 4.0–10.5)
nRBC: 0 % (ref 0.0–0.2)

## 2022-05-08 LAB — IRON AND TIBC
Iron: 89 ug/dL (ref 28–170)
Saturation Ratios: 32 % — ABNORMAL HIGH (ref 10.4–31.8)
TIBC: 277 ug/dL (ref 250–450)
UIBC: 188 ug/dL

## 2022-05-08 LAB — DIFFERENTIAL
Abs Immature Granulocytes: 0.03 10*3/uL (ref 0.00–0.07)
Basophils Absolute: 0 10*3/uL (ref 0.0–0.1)
Basophils Relative: 1 %
Eosinophils Absolute: 0.2 10*3/uL (ref 0.0–0.5)
Eosinophils Relative: 3 %
Immature Granulocytes: 0 %
Lymphocytes Relative: 28 %
Lymphs Abs: 2.4 10*3/uL (ref 0.7–4.0)
Monocytes Absolute: 0.6 10*3/uL (ref 0.1–1.0)
Monocytes Relative: 7 %
Neutro Abs: 5.3 10*3/uL (ref 1.7–7.7)
Neutrophils Relative %: 61 %

## 2022-05-08 LAB — POCT HEMOGLOBIN-HEMACUE: Hemoglobin: 10.1 g/dL — ABNORMAL LOW (ref 12.0–15.0)

## 2022-05-08 LAB — FERRITIN: Ferritin: 317 ng/mL — ABNORMAL HIGH (ref 11–307)

## 2022-05-08 LAB — MAGNESIUM: Magnesium: 1.8 mg/dL (ref 1.7–2.4)

## 2022-05-08 MED ORDER — EPOETIN ALFA-EPBX 10000 UNIT/ML IJ SOLN: 20000.0000 [IU] | INTRAMUSCULAR | Status: DC

## 2022-05-08 MED ORDER — EPOETIN ALFA-EPBX 10000 UNIT/ML IJ SOLN
INTRAMUSCULAR | Status: AC
  Administered 2022-05-08: 20000 [IU] via SUBCUTANEOUS
  Filled 2022-05-08: qty 2

## 2022-05-21 ENCOUNTER — Other Ambulatory Visit: Payer: Self-pay

## 2022-05-21 DIAGNOSIS — E7800 Pure hypercholesterolemia, unspecified: Secondary | ICD-10-CM

## 2022-05-21 MED ORDER — ATORVASTATIN CALCIUM 40 MG PO TABS: ORAL_TABLET | ORAL | 3 refills | Status: DC

## 2022-05-22 ENCOUNTER — Encounter (HOSPITAL_COMMUNITY)
Admission: RE | Admit: 2022-05-22 | Discharge: 2022-05-22 | Disposition: A | Discharge status: Home / Self Care | Payer: Medicare HMO | Source: Ambulatory Visit | Attending: Nephrology | Admitting: Nephrology

## 2022-05-22 VITALS — BP 161/61 | HR 74 | Temp 97.4°F | Resp 18

## 2022-05-22 DIAGNOSIS — N179 Acute kidney failure, unspecified: Secondary | ICD-10-CM | POA: Diagnosis not present

## 2022-05-22 DIAGNOSIS — D631 Anemia in chronic kidney disease: Secondary | ICD-10-CM | POA: Diagnosis not present

## 2022-05-22 DIAGNOSIS — N189 Chronic kidney disease, unspecified: Secondary | ICD-10-CM | POA: Diagnosis not present

## 2022-05-22 LAB — POCT HEMOGLOBIN-HEMACUE: Hemoglobin: 9.1 g/dL — ABNORMAL LOW (ref 12.0–15.0)

## 2022-05-22 MED ORDER — EPOETIN ALFA-EPBX 10000 UNIT/ML IJ SOLN
INTRAMUSCULAR | Status: AC
  Filled 2022-05-22: qty 2

## 2022-05-22 MED ORDER — EPOETIN ALFA-EPBX 10000 UNIT/ML IJ SOLN
20000.0000 [IU] | INTRAMUSCULAR | Status: DC
  Administered 2022-05-22: 20000 [IU] via SUBCUTANEOUS

## 2022-06-05 ENCOUNTER — Encounter (HOSPITAL_COMMUNITY)
Admission: RE | Admit: 2022-06-05 | Discharge: 2022-06-05 | Disposition: A | Discharge status: Home / Self Care | Payer: Medicare HMO | Source: Ambulatory Visit | Attending: Nephrology | Admitting: Nephrology

## 2022-06-05 VITALS — BP 147/53 | HR 65 | Temp 98.5°F | Resp 12

## 2022-06-05 DIAGNOSIS — D631 Anemia in chronic kidney disease: Secondary | ICD-10-CM | POA: Diagnosis not present

## 2022-06-05 DIAGNOSIS — N179 Acute kidney failure, unspecified: Secondary | ICD-10-CM | POA: Diagnosis not present

## 2022-06-05 DIAGNOSIS — N189 Chronic kidney disease, unspecified: Secondary | ICD-10-CM | POA: Insufficient documentation

## 2022-06-05 LAB — MAGNESIUM: Magnesium: 1.8 mg/dL (ref 1.7–2.4)

## 2022-06-05 LAB — DIFFERENTIAL
Abs Immature Granulocytes: 0.03 10*3/uL (ref 0.00–0.07)
Basophils Absolute: 0 10*3/uL (ref 0.0–0.1)
Basophils Relative: 0 %
Eosinophils Absolute: 0.4 10*3/uL (ref 0.0–0.5)
Eosinophils Relative: 4 %
Immature Granulocytes: 0 %
Lymphocytes Relative: 27 %
Lymphs Abs: 2.6 10*3/uL (ref 0.7–4.0)
Monocytes Absolute: 0.7 10*3/uL (ref 0.1–1.0)
Monocytes Relative: 8 %
Neutro Abs: 5.8 10*3/uL (ref 1.7–7.7)
Neutrophils Relative %: 61 %

## 2022-06-05 LAB — CBC
HCT: 32.2 % — ABNORMAL LOW (ref 36.0–46.0)
Hemoglobin: 10.3 g/dL — ABNORMAL LOW (ref 12.0–15.0)
MCH: 30.8 pg (ref 26.0–34.0)
MCHC: 32 g/dL (ref 30.0–36.0)
MCV: 96.4 fL (ref 80.0–100.0)
Platelets: 143 10*3/uL — ABNORMAL LOW (ref 150–400)
RBC: 3.34 MIL/uL — ABNORMAL LOW (ref 3.87–5.11)
RDW: 13.7 % (ref 11.5–15.5)
WBC: 9.5 10*3/uL (ref 4.0–10.5)
nRBC: 0 % (ref 0.0–0.2)

## 2022-06-05 LAB — IRON AND TIBC
Iron: 59 ug/dL (ref 28–170)
Saturation Ratios: 22 % (ref 10.4–31.8)
TIBC: 270 ug/dL (ref 250–450)
UIBC: 211 ug/dL

## 2022-06-05 LAB — RENAL FUNCTION PANEL
Albumin: 3.8 g/dL (ref 3.5–5.0)
Anion gap: 13 (ref 5–15)
BUN: 99 mg/dL — ABNORMAL HIGH (ref 8–23)
CO2: 19 mmol/L — ABNORMAL LOW (ref 22–32)
Calcium: 9.3 mg/dL (ref 8.9–10.3)
Chloride: 107 mmol/L (ref 98–111)
Creatinine, Ser: 3.95 mg/dL — ABNORMAL HIGH (ref 0.44–1.00)
GFR, Estimated: 11 mL/min — ABNORMAL LOW (ref >60)
Glucose, Bld: 97 mg/dL (ref 70–99)
Phosphorus: 4.9 mg/dL — ABNORMAL HIGH (ref 2.5–4.6)
Potassium: 4.9 mmol/L (ref 3.5–5.1)
Sodium: 139 mmol/L (ref 135–145)

## 2022-06-05 LAB — FERRITIN: Ferritin: 289 ng/mL (ref 11–307)

## 2022-06-05 LAB — POCT HEMOGLOBIN-HEMACUE: Hemoglobin: 10.9 g/dL — ABNORMAL LOW (ref 12.0–15.0)

## 2022-06-05 MED ORDER — EPOETIN ALFA 20000 UNIT/ML IJ SOLN
20000.0000 [IU] | Freq: Once | INTRAMUSCULAR | Status: AC
  Administered 2022-06-05: 20000 [IU] via SUBCUTANEOUS

## 2022-06-05 MED ORDER — EPOETIN ALFA 20000 UNIT/ML IJ SOLN
INTRAMUSCULAR | Status: AC
  Filled 2022-06-05: qty 1

## 2022-06-05 MED ORDER — EPOETIN ALFA-EPBX 40000 UNIT/ML IJ SOLN: 20000.0000 [IU] | INTRAMUSCULAR | Status: DC

## 2022-06-06 ENCOUNTER — Other Ambulatory Visit: Payer: Self-pay | Admitting: Family Medicine

## 2022-06-19 ENCOUNTER — Encounter (HOSPITAL_COMMUNITY)
Admission: RE | Admit: 2022-06-19 | Discharge: 2022-06-19 | Disposition: A | Discharge status: Home / Self Care | Payer: Medicare HMO | Source: Ambulatory Visit | Attending: Nephrology | Admitting: Nephrology

## 2022-06-19 VITALS — BP 138/49 | HR 62 | Temp 97.6°F | Resp 18

## 2022-06-19 DIAGNOSIS — D631 Anemia in chronic kidney disease: Secondary | ICD-10-CM | POA: Diagnosis not present

## 2022-06-19 DIAGNOSIS — N179 Acute kidney failure, unspecified: Secondary | ICD-10-CM | POA: Diagnosis not present

## 2022-06-19 DIAGNOSIS — N189 Chronic kidney disease, unspecified: Secondary | ICD-10-CM

## 2022-06-19 LAB — POCT HEMOGLOBIN-HEMACUE: Hemoglobin: 11 g/dL — ABNORMAL LOW (ref 12.0–15.0)

## 2022-06-19 MED ORDER — EPOETIN ALFA 20000 UNIT/ML IJ SOLN
INTRAMUSCULAR | Status: AC
  Filled 2022-06-19: qty 1

## 2022-06-19 MED ORDER — EPOETIN ALFA 20000 UNIT/ML IJ SOLN
20000.0000 [IU] | Freq: Once | INTRAMUSCULAR | Status: AC
  Administered 2022-06-19: 20000 [IU] via SUBCUTANEOUS

## 2022-06-19 MED ORDER — EPOETIN ALFA-EPBX 40000 UNIT/ML IJ SOLN: 20000.0000 [IU] | INTRAMUSCULAR | Status: DC

## 2022-07-03 ENCOUNTER — Encounter (HOSPITAL_COMMUNITY)
Admission: RE | Admit: 2022-07-03 | Discharge: 2022-07-03 | Disposition: A | Discharge status: Home / Self Care | Payer: Medicare HMO | Source: Ambulatory Visit | Attending: Nephrology | Admitting: Nephrology

## 2022-07-03 VITALS — BP 144/47 | HR 61

## 2022-07-03 DIAGNOSIS — N179 Acute kidney failure, unspecified: Secondary | ICD-10-CM | POA: Diagnosis not present

## 2022-07-03 DIAGNOSIS — D631 Anemia in chronic kidney disease: Secondary | ICD-10-CM | POA: Insufficient documentation

## 2022-07-03 DIAGNOSIS — N189 Chronic kidney disease, unspecified: Secondary | ICD-10-CM | POA: Insufficient documentation

## 2022-07-03 LAB — DIFFERENTIAL
Abs Immature Granulocytes: 0.04 10*3/uL (ref 0.00–0.07)
Basophils Absolute: 0.1 10*3/uL (ref 0.0–0.1)
Basophils Relative: 1 %
Eosinophils Absolute: 0.4 10*3/uL (ref 0.0–0.5)
Eosinophils Relative: 4 %
Immature Granulocytes: 1 %
Lymphocytes Relative: 29 %
Lymphs Abs: 2.5 10*3/uL (ref 0.7–4.0)
Monocytes Absolute: 0.7 10*3/uL (ref 0.1–1.0)
Monocytes Relative: 8 %
Neutro Abs: 5.1 10*3/uL (ref 1.7–7.7)
Neutrophils Relative %: 57 %

## 2022-07-03 LAB — RENAL FUNCTION PANEL
Albumin: 3.8 g/dL (ref 3.5–5.0)
Anion gap: 10 (ref 5–15)
BUN: 101 mg/dL — ABNORMAL HIGH (ref 8–23)
CO2: 19 mmol/L — ABNORMAL LOW (ref 22–32)
Calcium: 9.5 mg/dL (ref 8.9–10.3)
Chloride: 110 mmol/L (ref 98–111)
Creatinine, Ser: 3.66 mg/dL — ABNORMAL HIGH (ref 0.44–1.00)
GFR, Estimated: 12 mL/min — ABNORMAL LOW (ref >60)
Glucose, Bld: 108 mg/dL — ABNORMAL HIGH (ref 70–99)
Phosphorus: 5.3 mg/dL — ABNORMAL HIGH (ref 2.5–4.6)
Potassium: 4.4 mmol/L (ref 3.5–5.1)
Sodium: 139 mmol/L (ref 135–145)

## 2022-07-03 LAB — IRON AND TIBC
Iron: 80 ug/dL (ref 28–170)
Saturation Ratios: 30 % (ref 10.4–31.8)
TIBC: 267 ug/dL (ref 250–450)
UIBC: 187 ug/dL

## 2022-07-03 LAB — CBC
HCT: 33.4 % — ABNORMAL LOW (ref 36.0–46.0)
Hemoglobin: 10.5 g/dL — ABNORMAL LOW (ref 12.0–15.0)
MCH: 30.7 pg (ref 26.0–34.0)
MCHC: 31.4 g/dL (ref 30.0–36.0)
MCV: 97.7 fL (ref 80.0–100.0)
Platelets: 129 10*3/uL — ABNORMAL LOW (ref 150–400)
RBC: 3.42 MIL/uL — ABNORMAL LOW (ref 3.87–5.11)
RDW: 13.8 % (ref 11.5–15.5)
WBC: 8.7 10*3/uL (ref 4.0–10.5)
nRBC: 0 % (ref 0.0–0.2)

## 2022-07-03 LAB — MAGNESIUM: Magnesium: 1.8 mg/dL (ref 1.7–2.4)

## 2022-07-03 LAB — POCT HEMOGLOBIN-HEMACUE: Hemoglobin: 11 g/dL — ABNORMAL LOW (ref 12.0–15.0)

## 2022-07-03 LAB — FERRITIN: Ferritin: 197 ng/mL (ref 11–307)

## 2022-07-03 MED ORDER — EPOETIN ALFA-EPBX 40000 UNIT/ML IJ SOLN: 20000.0000 [IU] | INTRAMUSCULAR | Status: DC

## 2022-07-03 MED ORDER — EPOETIN ALFA 20000 UNIT/ML IJ SOLN
INTRAMUSCULAR | Status: AC
  Administered 2022-07-03: 20000 [IU] via SUBCUTANEOUS
  Filled 2022-07-03: qty 1

## 2022-07-11 ENCOUNTER — Ambulatory Visit: Payer: Self-pay

## 2022-07-11 NOTE — Patient Outreach (Signed)
  Care Coordination   07/11/2022 Name: Kimberly Carlson MRN: [id number] DOB: [birth year]   Care Coordination Outreach Attempts:  An unsuccessful telephone outreach was attempted today to offer the patient information about available care coordination services as a benefit of their health plan.   Follow Up Plan:  Additional outreach attempts will be made to offer the patient care coordination information and services.   Encounter Outcome:  No Answer  Care Coordination Interventions Activated:  No   Care Coordination Interventions:  No, not indicated    [name], RN, BSN, CCM Care Management Coordinator [facility] Phone: [phone]: [phone]

## 2022-07-16 DIAGNOSIS — I120 Hypertensive chronic kidney disease with stage 5 chronic kidney disease or end stage renal disease: Secondary | ICD-10-CM | POA: Diagnosis not present

## 2022-07-16 DIAGNOSIS — D631 Anemia in chronic kidney disease: Secondary | ICD-10-CM | POA: Diagnosis not present

## 2022-07-16 DIAGNOSIS — N2581 Secondary hyperparathyroidism of renal origin: Secondary | ICD-10-CM | POA: Diagnosis not present

## 2022-07-16 DIAGNOSIS — E1122 Type 2 diabetes mellitus with diabetic chronic kidney disease: Secondary | ICD-10-CM | POA: Diagnosis not present

## 2022-07-16 DIAGNOSIS — N185 Chronic kidney disease, stage 5: Secondary | ICD-10-CM | POA: Diagnosis not present

## 2022-07-17 ENCOUNTER — Encounter (HOSPITAL_COMMUNITY)
Admission: RE | Admit: 2022-07-17 | Discharge: 2022-07-17 | Disposition: A | Discharge status: Home / Self Care | Payer: Medicare HMO | Source: Ambulatory Visit | Attending: Nephrology | Admitting: Nephrology

## 2022-07-17 VITALS — BP 126/38 | HR 65 | Resp 16

## 2022-07-17 DIAGNOSIS — N179 Acute kidney failure, unspecified: Secondary | ICD-10-CM | POA: Diagnosis not present

## 2022-07-17 DIAGNOSIS — N189 Chronic kidney disease, unspecified: Secondary | ICD-10-CM | POA: Diagnosis not present

## 2022-07-17 DIAGNOSIS — D631 Anemia in chronic kidney disease: Secondary | ICD-10-CM

## 2022-07-17 LAB — POCT HEMOGLOBIN-HEMACUE: Hemoglobin: 11.5 g/dL — ABNORMAL LOW (ref 12.0–15.0)

## 2022-07-17 MED ORDER — EPOETIN ALFA 20000 UNIT/ML IJ SOLN
INTRAMUSCULAR | Status: AC
  Administered 2022-07-17: 20000 [IU]
  Filled 2022-07-17: qty 1

## 2022-07-17 MED ORDER — EPOETIN ALFA 20000 UNIT/ML IJ SOLN: 20000.0000 [IU] | Freq: Once | INTRAMUSCULAR | Status: DC

## 2022-07-17 MED ORDER — EPOETIN ALFA-EPBX 40000 UNIT/ML IJ SOLN: 20000.0000 [IU] | INTRAMUSCULAR | Status: DC

## 2022-07-31 ENCOUNTER — Encounter (HOSPITAL_COMMUNITY)
Admission: RE | Admit: 2022-07-31 | Discharge: 2022-07-31 | Disposition: A | Discharge status: Home / Self Care | Payer: Medicare HMO | Source: Ambulatory Visit | Attending: Nephrology | Admitting: Nephrology

## 2022-07-31 VITALS — BP 123/51 | HR 70 | Resp 16

## 2022-07-31 DIAGNOSIS — N179 Acute kidney failure, unspecified: Secondary | ICD-10-CM | POA: Diagnosis not present

## 2022-07-31 DIAGNOSIS — N189 Chronic kidney disease, unspecified: Secondary | ICD-10-CM | POA: Insufficient documentation

## 2022-07-31 DIAGNOSIS — D631 Anemia in chronic kidney disease: Secondary | ICD-10-CM | POA: Insufficient documentation

## 2022-07-31 LAB — RENAL FUNCTION PANEL
Albumin: 3.4 g/dL — ABNORMAL LOW (ref 3.5–5.0)
Anion gap: 10 (ref 5–15)
BUN: 86 mg/dL — ABNORMAL HIGH (ref 8–23)
CO2: 23 mmol/L (ref 22–32)
Calcium: 9.5 mg/dL (ref 8.9–10.3)
Chloride: 107 mmol/L (ref 98–111)
Creatinine, Ser: 3.34 mg/dL — ABNORMAL HIGH (ref 0.44–1.00)
GFR, Estimated: 14 mL/min — ABNORMAL LOW (ref >60)
Glucose, Bld: 120 mg/dL — ABNORMAL HIGH (ref 70–99)
Phosphorus: 4.5 mg/dL (ref 2.5–4.6)
Potassium: 4.5 mmol/L (ref 3.5–5.1)
Sodium: 140 mmol/L (ref 135–145)

## 2022-07-31 LAB — DIFFERENTIAL
Abs Immature Granulocytes: 0.03 10*3/uL (ref 0.00–0.07)
Basophils Absolute: 0.1 10*3/uL (ref 0.0–0.1)
Basophils Relative: 1 %
Eosinophils Absolute: 0.4 10*3/uL (ref 0.0–0.5)
Eosinophils Relative: 4 %
Immature Granulocytes: 0 %
Lymphocytes Relative: 24 %
Lymphs Abs: 2.1 10*3/uL (ref 0.7–4.0)
Monocytes Absolute: 0.7 10*3/uL (ref 0.1–1.0)
Monocytes Relative: 8 %
Neutro Abs: 5.6 10*3/uL (ref 1.7–7.7)
Neutrophils Relative %: 63 %

## 2022-07-31 LAB — CBC
HCT: 33 % — ABNORMAL LOW (ref 36.0–46.0)
Hemoglobin: 10.5 g/dL — ABNORMAL LOW (ref 12.0–15.0)
MCH: 31.2 pg (ref 26.0–34.0)
MCHC: 31.8 g/dL (ref 30.0–36.0)
MCV: 97.9 fL (ref 80.0–100.0)
Platelets: 156 10*3/uL (ref 150–400)
RBC: 3.37 MIL/uL — ABNORMAL LOW (ref 3.87–5.11)
RDW: 13.4 % (ref 11.5–15.5)
WBC: 8.8 10*3/uL (ref 4.0–10.5)
nRBC: 0 % (ref 0.0–0.2)

## 2022-07-31 LAB — MAGNESIUM: Magnesium: 1.7 mg/dL (ref 1.7–2.4)

## 2022-07-31 LAB — IRON AND TIBC
Iron: 68 ug/dL (ref 28–170)
Saturation Ratios: 27 % (ref 10.4–31.8)
TIBC: 253 ug/dL (ref 250–450)
UIBC: 185 ug/dL

## 2022-07-31 LAB — FERRITIN: Ferritin: 241 ng/mL (ref 11–307)

## 2022-07-31 MED ORDER — EPOETIN ALFA-EPBX 10000 UNIT/ML IJ SOLN: 20000.0000 [IU] | INTRAMUSCULAR | Status: DC

## 2022-07-31 MED ORDER — EPOETIN ALFA-EPBX 10000 UNIT/ML IJ SOLN
INTRAMUSCULAR | Status: AC
  Administered 2022-07-31: 20000 [IU] via SUBCUTANEOUS
  Filled 2022-07-31: qty 2

## 2022-08-09 LAB — POCT HEMOGLOBIN-HEMACUE: Hemoglobin: 10.7 g/dL — ABNORMAL LOW (ref 12.0–15.0)

## 2022-08-14 ENCOUNTER — Encounter (HOSPITAL_COMMUNITY)
Admission: RE | Admit: 2022-08-14 | Discharge: 2022-08-14 | Disposition: A | Discharge status: Home / Self Care | Payer: Medicare HMO | Source: Ambulatory Visit | Attending: Nephrology | Admitting: Nephrology

## 2022-08-14 VITALS — BP 138/49 | HR 71 | Temp 96.8°F | Resp 18

## 2022-08-14 DIAGNOSIS — N189 Chronic kidney disease, unspecified: Secondary | ICD-10-CM

## 2022-08-14 DIAGNOSIS — N179 Acute kidney failure, unspecified: Secondary | ICD-10-CM | POA: Diagnosis not present

## 2022-08-14 DIAGNOSIS — D631 Anemia in chronic kidney disease: Secondary | ICD-10-CM | POA: Diagnosis not present

## 2022-08-14 LAB — POCT HEMOGLOBIN-HEMACUE: Hemoglobin: 11.1 g/dL — ABNORMAL LOW (ref 12.0–15.0)

## 2022-08-14 MED ORDER — EPOETIN ALFA-EPBX 10000 UNIT/ML IJ SOLN
20000.0000 [IU] | INTRAMUSCULAR | Status: DC
  Administered 2022-08-14: 20000 [IU] via SUBCUTANEOUS

## 2022-08-14 MED ORDER — EPOETIN ALFA-EPBX 10000 UNIT/ML IJ SOLN
INTRAMUSCULAR | Status: AC
  Filled 2022-08-14: qty 2

## 2022-08-28 ENCOUNTER — Encounter (HOSPITAL_COMMUNITY)
Admission: RE | Admit: 2022-08-28 | Discharge: 2022-08-28 | Disposition: A | Discharge status: Home / Self Care | Payer: Medicare HMO | Source: Ambulatory Visit | Attending: Nephrology | Admitting: Nephrology

## 2022-08-28 VITALS — BP 136/52 | HR 65 | Temp 97.4°F

## 2022-08-28 DIAGNOSIS — N189 Chronic kidney disease, unspecified: Secondary | ICD-10-CM | POA: Diagnosis not present

## 2022-08-28 DIAGNOSIS — N170 Acute kidney failure with tubular necrosis: Secondary | ICD-10-CM | POA: Insufficient documentation

## 2022-08-28 DIAGNOSIS — N183 Chronic kidney disease, stage 3 unspecified: Secondary | ICD-10-CM | POA: Diagnosis not present

## 2022-08-28 DIAGNOSIS — D631 Anemia in chronic kidney disease: Secondary | ICD-10-CM | POA: Diagnosis not present

## 2022-08-28 LAB — CBC
HCT: 34.8 % — ABNORMAL LOW (ref 36.0–46.0)
Hemoglobin: 10.9 g/dL — ABNORMAL LOW (ref 12.0–15.0)
MCH: 31 pg (ref 26.0–34.0)
MCHC: 31.3 g/dL (ref 30.0–36.0)
MCV: 98.9 fL (ref 80.0–100.0)
Platelets: 139 10*3/uL — ABNORMAL LOW (ref 150–400)
RBC: 3.52 MIL/uL — ABNORMAL LOW (ref 3.87–5.11)
RDW: 13.9 % (ref 11.5–15.5)
WBC: 9.3 10*3/uL (ref 4.0–10.5)
nRBC: 0 % (ref 0.0–0.2)

## 2022-08-28 LAB — DIFFERENTIAL
Abs Immature Granulocytes: 0.03 10*3/uL (ref 0.00–0.07)
Basophils Absolute: 0.1 10*3/uL (ref 0.0–0.1)
Basophils Relative: 1 %
Eosinophils Absolute: 0.4 10*3/uL (ref 0.0–0.5)
Eosinophils Relative: 4 %
Immature Granulocytes: 0 %
Lymphocytes Relative: 25 %
Lymphs Abs: 2.3 10*3/uL (ref 0.7–4.0)
Monocytes Absolute: 0.7 10*3/uL (ref 0.1–1.0)
Monocytes Relative: 8 %
Neutro Abs: 5.8 10*3/uL (ref 1.7–7.7)
Neutrophils Relative %: 62 %

## 2022-08-28 LAB — RENAL FUNCTION PANEL
Albumin: 3.7 g/dL (ref 3.5–5.0)
Anion gap: 12 (ref 5–15)
BUN: 94 mg/dL — ABNORMAL HIGH (ref 8–23)
CO2: 22 mmol/L (ref 22–32)
Calcium: 9.9 mg/dL (ref 8.9–10.3)
Chloride: 104 mmol/L (ref 98–111)
Creatinine, Ser: 3.67 mg/dL — ABNORMAL HIGH (ref 0.44–1.00)
GFR, Estimated: 12 mL/min — ABNORMAL LOW (ref >60)
Glucose, Bld: 80 mg/dL (ref 70–99)
Phosphorus: 5 mg/dL — ABNORMAL HIGH (ref 2.5–4.6)
Potassium: 4.4 mmol/L (ref 3.5–5.1)
Sodium: 138 mmol/L (ref 135–145)

## 2022-08-28 LAB — FERRITIN: Ferritin: 248 ng/mL (ref 11–307)

## 2022-08-28 LAB — IRON AND TIBC
Iron: 87 ug/dL (ref 28–170)
Saturation Ratios: 31 % (ref 10.4–31.8)
TIBC: 279 ug/dL (ref 250–450)
UIBC: 192 ug/dL

## 2022-08-28 LAB — POCT HEMOGLOBIN-HEMACUE: Hemoglobin: 11.4 g/dL — ABNORMAL LOW (ref 12.0–15.0)

## 2022-08-28 LAB — MAGNESIUM: Magnesium: 1.8 mg/dL (ref 1.7–2.4)

## 2022-08-28 MED ORDER — EPOETIN ALFA-EPBX 40000 UNIT/ML IJ SOLN
INTRAMUSCULAR | Status: AC
  Administered 2022-08-28: 20000 [IU]
  Filled 2022-08-28: qty 1

## 2022-08-28 MED ORDER — EPOETIN ALFA-EPBX 10000 UNIT/ML IJ SOLN: 20000.0000 [IU] | INTRAMUSCULAR | Status: DC

## 2022-08-30 ENCOUNTER — Other Ambulatory Visit: Payer: Self-pay

## 2022-08-30 DIAGNOSIS — E1165 Type 2 diabetes mellitus with hyperglycemia: Secondary | ICD-10-CM

## 2022-08-30 MED ORDER — INSULIN GLARGINE 100 UNIT/ML ~~LOC~~ SOLN: SUBCUTANEOUS | 1 refills | Status: DC

## 2022-09-11 ENCOUNTER — Encounter (HOSPITAL_COMMUNITY)
Admission: RE | Admit: 2022-09-11 | Discharge: 2022-09-11 | Disposition: A | Discharge status: Home / Self Care | Payer: Medicare HMO | Source: Ambulatory Visit | Attending: Nephrology | Admitting: Nephrology

## 2022-09-11 VITALS — BP 127/47 | HR 64 | Temp 97.8°F | Resp 18

## 2022-09-11 DIAGNOSIS — D631 Anemia in chronic kidney disease: Secondary | ICD-10-CM | POA: Diagnosis not present

## 2022-09-11 DIAGNOSIS — N189 Chronic kidney disease, unspecified: Secondary | ICD-10-CM

## 2022-09-11 DIAGNOSIS — N170 Acute kidney failure with tubular necrosis: Secondary | ICD-10-CM | POA: Diagnosis not present

## 2022-09-11 DIAGNOSIS — N183 Chronic kidney disease, stage 3 unspecified: Secondary | ICD-10-CM

## 2022-09-11 LAB — POCT HEMOGLOBIN-HEMACUE: Hemoglobin: 11 g/dL — ABNORMAL LOW (ref 12.0–15.0)

## 2022-09-11 MED ORDER — EPOETIN ALFA-EPBX 10000 UNIT/ML IJ SOLN: 20000.0000 [IU] | INTRAMUSCULAR | Status: DC

## 2022-09-11 MED ORDER — EPOETIN ALFA-EPBX 10000 UNIT/ML IJ SOLN
INTRAMUSCULAR | Status: AC
  Administered 2022-09-11: 20000 [IU] via SUBCUTANEOUS
  Filled 2022-09-11: qty 2

## 2022-09-20 ENCOUNTER — Telehealth: Payer: Self-pay

## 2022-09-20 NOTE — Patient Outreach (Signed)
  Care Coordination   09/20/2022 Name: DEMRI POULTON MRN: [id number] DOB: 05-04-1946   Care Coordination Outreach Attempts:  A second unsuccessful outreach was attempted today to offer the patient with information about available care coordination services as a benefit of their health plan.     Follow Up Plan:  Additional outreach attempts will be made to offer the patient care coordination information and services.   Encounter Outcome:  No Answer  Care Coordination Interventions Activated:  No   Care Coordination Interventions:  No, not indicated    Jone Baseman, RN, MSN [facility] Care Management Care Management Coordinator Direct Line [phone]

## 2022-09-25 ENCOUNTER — Encounter (HOSPITAL_COMMUNITY)
Admission: RE | Admit: 2022-09-25 | Discharge: 2022-09-25 | Disposition: A | Discharge status: Home / Self Care | Payer: Medicare HMO | Source: Ambulatory Visit | Attending: Nephrology | Admitting: Nephrology

## 2022-09-25 VITALS — BP 133/53 | HR 62 | Temp 96.7°F | Resp 18

## 2022-09-25 DIAGNOSIS — N170 Acute kidney failure with tubular necrosis: Secondary | ICD-10-CM

## 2022-09-25 DIAGNOSIS — N189 Chronic kidney disease, unspecified: Secondary | ICD-10-CM | POA: Diagnosis not present

## 2022-09-25 DIAGNOSIS — N183 Chronic kidney disease, stage 3 unspecified: Secondary | ICD-10-CM | POA: Diagnosis not present

## 2022-09-25 DIAGNOSIS — D631 Anemia in chronic kidney disease: Secondary | ICD-10-CM | POA: Diagnosis not present

## 2022-09-25 LAB — POCT HEMOGLOBIN-HEMACUE: Hemoglobin: 11.8 g/dL — ABNORMAL LOW (ref 12.0–15.0)

## 2022-09-25 MED ORDER — EPOETIN ALFA-EPBX 40000 UNIT/ML IJ SOLN
INTRAMUSCULAR | Status: AC
  Administered 2022-09-25: 20000 [IU] via SUBCUTANEOUS
  Filled 2022-09-25: qty 1

## 2022-09-25 MED ORDER — EPOETIN ALFA-EPBX 10000 UNIT/ML IJ SOLN: 20000.0000 [IU] | INTRAMUSCULAR | Status: DC

## 2022-10-09 ENCOUNTER — Encounter (HOSPITAL_COMMUNITY)
Admission: RE | Admit: 2022-10-09 | Discharge: 2022-10-09 | Disposition: A | Discharge status: Home / Self Care | Payer: Medicare HMO | Source: Ambulatory Visit | Attending: Nephrology | Admitting: Nephrology

## 2022-10-09 ENCOUNTER — Telehealth: Payer: Self-pay

## 2022-10-09 VITALS — BP 132/41 | HR 66 | Temp 97.5°F | Resp 18

## 2022-10-09 DIAGNOSIS — D631 Anemia in chronic kidney disease: Secondary | ICD-10-CM | POA: Insufficient documentation

## 2022-10-09 DIAGNOSIS — N183 Chronic kidney disease, stage 3 unspecified: Secondary | ICD-10-CM | POA: Insufficient documentation

## 2022-10-09 DIAGNOSIS — N170 Acute kidney failure with tubular necrosis: Secondary | ICD-10-CM | POA: Diagnosis not present

## 2022-10-09 DIAGNOSIS — N189 Chronic kidney disease, unspecified: Secondary | ICD-10-CM | POA: Insufficient documentation

## 2022-10-09 LAB — RENAL FUNCTION PANEL
Albumin: 3.8 g/dL (ref 3.5–5.0)
Anion gap: 12 (ref 5–15)
BUN: 122 mg/dL — ABNORMAL HIGH (ref 8–23)
CO2: 17 mmol/L — ABNORMAL LOW (ref 22–32)
Calcium: 9.6 mg/dL (ref 8.9–10.3)
Chloride: 110 mmol/L (ref 98–111)
Creatinine, Ser: 3.89 mg/dL — ABNORMAL HIGH (ref 0.44–1.00)
GFR, Estimated: 11 mL/min — ABNORMAL LOW (ref >60)
Glucose, Bld: 121 mg/dL — ABNORMAL HIGH (ref 70–99)
Phosphorus: 6.2 mg/dL — ABNORMAL HIGH (ref 2.5–4.6)
Potassium: 4.7 mmol/L (ref 3.5–5.1)
Sodium: 139 mmol/L (ref 135–145)

## 2022-10-09 LAB — CBC
HCT: 37 % (ref 36.0–46.0)
Hemoglobin: 11.5 g/dL — ABNORMAL LOW (ref 12.0–15.0)
MCH: 30.7 pg (ref 26.0–34.0)
MCHC: 31.1 g/dL (ref 30.0–36.0)
MCV: 98.7 fL (ref 80.0–100.0)
Platelets: 133 10*3/uL — ABNORMAL LOW (ref 150–400)
RBC: 3.75 MIL/uL — ABNORMAL LOW (ref 3.87–5.11)
RDW: 13.9 % (ref 11.5–15.5)
WBC: 8.1 10*3/uL (ref 4.0–10.5)
nRBC: 0 % (ref 0.0–0.2)

## 2022-10-09 LAB — DIFFERENTIAL
Abs Immature Granulocytes: 0.03 10*3/uL (ref 0.00–0.07)
Basophils Absolute: 0 10*3/uL (ref 0.0–0.1)
Basophils Relative: 1 %
Eosinophils Absolute: 0.3 10*3/uL (ref 0.0–0.5)
Eosinophils Relative: 3 %
Immature Granulocytes: 0 %
Lymphocytes Relative: 19 %
Lymphs Abs: 1.6 10*3/uL (ref 0.7–4.0)
Monocytes Absolute: 0.6 10*3/uL (ref 0.1–1.0)
Monocytes Relative: 7 %
Neutro Abs: 5.6 10*3/uL (ref 1.7–7.7)
Neutrophils Relative %: 70 %

## 2022-10-09 LAB — IRON AND TIBC
Iron: 81 ug/dL (ref 28–170)
Saturation Ratios: 28 % (ref 10.4–31.8)
TIBC: 287 ug/dL (ref 250–450)
UIBC: 206 ug/dL

## 2022-10-09 LAB — MAGNESIUM: Magnesium: 1.8 mg/dL (ref 1.7–2.4)

## 2022-10-09 LAB — POCT HEMOGLOBIN-HEMACUE: Hemoglobin: 11.7 g/dL — ABNORMAL LOW (ref 12.0–15.0)

## 2022-10-09 LAB — FERRITIN: Ferritin: 217 ng/mL (ref 11–307)

## 2022-10-09 MED ORDER — EPOETIN ALFA-EPBX 10000 UNIT/ML IJ SOLN
INTRAMUSCULAR | Status: AC
  Filled 2022-10-09: qty 2

## 2022-10-09 MED ORDER — EPOETIN ALFA-EPBX 10000 UNIT/ML IJ SOLN
20000.0000 [IU] | INTRAMUSCULAR | Status: DC
  Administered 2022-10-09: 20000 [IU] via SUBCUTANEOUS

## 2022-10-09 NOTE — Patient Outreach (Addendum)
  Care Coordination   10/09/2022 Name: Kimberly Carlson MRN: [id number] DOB: 1946/05/14   Care Coordination Outreach Attempts:  A third unsuccessful outreach was attempted today to offer the patient with information about available care coordination services as a benefit of their health plan.   Follow Up Plan:  No further outreach attempts will be made at this time. We have been unable to contact the patient to offer or enroll patient in care coordination services  Encounter Outcome:  No Answer  Care Coordination Interventions Activated:  No   Care Coordination Interventions:  No, not indicated    Jone Baseman, RN, MSN [facility] [facility] Care Management Coordinator Direct Line [phone]

## 2022-10-23 ENCOUNTER — Encounter (HOSPITAL_COMMUNITY)
Admission: RE | Admit: 2022-10-23 | Discharge: 2022-10-23 | Disposition: A | Discharge status: Home / Self Care | Payer: Medicare HMO | Source: Ambulatory Visit | Attending: Nephrology | Admitting: Nephrology

## 2022-10-23 VITALS — BP 135/51 | HR 65 | Temp 97.9°F | Resp 18

## 2022-10-23 DIAGNOSIS — N183 Chronic kidney disease, stage 3 unspecified: Secondary | ICD-10-CM | POA: Diagnosis not present

## 2022-10-23 DIAGNOSIS — N189 Chronic kidney disease, unspecified: Secondary | ICD-10-CM | POA: Diagnosis not present

## 2022-10-23 DIAGNOSIS — N170 Acute kidney failure with tubular necrosis: Secondary | ICD-10-CM | POA: Diagnosis not present

## 2022-10-23 DIAGNOSIS — D631 Anemia in chronic kidney disease: Secondary | ICD-10-CM | POA: Diagnosis not present

## 2022-10-23 LAB — POCT HEMOGLOBIN-HEMACUE: Hemoglobin: 11.1 g/dL — ABNORMAL LOW (ref 12.0–15.0)

## 2022-10-23 MED ORDER — EPOETIN ALFA-EPBX 10000 UNIT/ML IJ SOLN
20000.0000 [IU] | INTRAMUSCULAR | Status: DC
  Administered 2022-10-23: 20000 [IU] via SUBCUTANEOUS

## 2022-10-23 MED ORDER — EPOETIN ALFA-EPBX 10000 UNIT/ML IJ SOLN
INTRAMUSCULAR | Status: AC
  Filled 2022-10-23: qty 2

## 2022-10-28 ENCOUNTER — Other Ambulatory Visit: Payer: Self-pay | Admitting: Family Medicine

## 2022-11-03 ENCOUNTER — Other Ambulatory Visit: Payer: Self-pay | Admitting: Family Medicine

## 2022-11-03 DIAGNOSIS — E7800 Pure hypercholesterolemia, unspecified: Secondary | ICD-10-CM

## 2022-11-06 ENCOUNTER — Encounter (HOSPITAL_COMMUNITY)
Admission: RE | Admit: 2022-11-06 | Discharge: 2022-11-06 | Disposition: A | Discharge status: Home / Self Care | Payer: Medicare HMO | Source: Ambulatory Visit | Attending: Nephrology | Admitting: Nephrology

## 2022-11-06 VITALS — BP 144/50 | HR 69 | Temp 97.1°F | Resp 18

## 2022-11-06 DIAGNOSIS — N170 Acute kidney failure with tubular necrosis: Secondary | ICD-10-CM | POA: Diagnosis not present

## 2022-11-06 DIAGNOSIS — N189 Chronic kidney disease, unspecified: Secondary | ICD-10-CM | POA: Insufficient documentation

## 2022-11-06 DIAGNOSIS — N183 Chronic kidney disease, stage 3 unspecified: Secondary | ICD-10-CM | POA: Insufficient documentation

## 2022-11-06 DIAGNOSIS — D631 Anemia in chronic kidney disease: Secondary | ICD-10-CM | POA: Diagnosis not present

## 2022-11-06 LAB — DIFFERENTIAL
Abs Immature Granulocytes: 0.03 10*3/uL (ref 0.00–0.07)
Basophils Absolute: 0 10*3/uL (ref 0.0–0.1)
Basophils Relative: 0 %
Eosinophils Absolute: 0.2 10*3/uL (ref 0.0–0.5)
Eosinophils Relative: 3 %
Immature Granulocytes: 0 %
Lymphocytes Relative: 23 %
Lymphs Abs: 2 10*3/uL (ref 0.7–4.0)
Monocytes Absolute: 0.7 10*3/uL (ref 0.1–1.0)
Monocytes Relative: 8 %
Neutro Abs: 5.9 10*3/uL (ref 1.7–7.7)
Neutrophils Relative %: 66 %

## 2022-11-06 LAB — CBC
HCT: 35.9 % — ABNORMAL LOW (ref 36.0–46.0)
Hemoglobin: 11.1 g/dL — ABNORMAL LOW (ref 12.0–15.0)
MCH: 30.8 pg (ref 26.0–34.0)
MCHC: 30.9 g/dL (ref 30.0–36.0)
MCV: 99.7 fL (ref 80.0–100.0)
Platelets: 151 10*3/uL (ref 150–400)
RBC: 3.6 MIL/uL — ABNORMAL LOW (ref 3.87–5.11)
RDW: 13.5 % (ref 11.5–15.5)
WBC: 8.9 10*3/uL (ref 4.0–10.5)
nRBC: 0 % (ref 0.0–0.2)

## 2022-11-06 LAB — IRON AND TIBC
Iron: 83 ug/dL (ref 28–170)
Saturation Ratios: 32 % — ABNORMAL HIGH (ref 10.4–31.8)
TIBC: 256 ug/dL (ref 250–450)
UIBC: 173 ug/dL

## 2022-11-06 LAB — RENAL FUNCTION PANEL
Albumin: 3.5 g/dL (ref 3.5–5.0)
Anion gap: 10 (ref 5–15)
BUN: 83 mg/dL — ABNORMAL HIGH (ref 8–23)
CO2: 19 mmol/L — ABNORMAL LOW (ref 22–32)
Calcium: 9.2 mg/dL (ref 8.9–10.3)
Chloride: 111 mmol/L (ref 98–111)
Creatinine, Ser: 3.51 mg/dL — ABNORMAL HIGH (ref 0.44–1.00)
GFR, Estimated: 13 mL/min — ABNORMAL LOW (ref >60)
Glucose, Bld: 117 mg/dL — ABNORMAL HIGH (ref 70–99)
Phosphorus: 5 mg/dL — ABNORMAL HIGH (ref 2.5–4.6)
Potassium: 5.2 mmol/L — ABNORMAL HIGH (ref 3.5–5.1)
Sodium: 140 mmol/L (ref 135–145)

## 2022-11-06 LAB — POCT HEMOGLOBIN-HEMACUE: Hemoglobin: 11.2 g/dL — ABNORMAL LOW (ref 12.0–15.0)

## 2022-11-06 LAB — MAGNESIUM: Magnesium: 1.8 mg/dL (ref 1.7–2.4)

## 2022-11-06 LAB — FERRITIN: Ferritin: 188 ng/mL (ref 11–307)

## 2022-11-06 MED ORDER — EPOETIN ALFA-EPBX 10000 UNIT/ML IJ SOLN
20000.0000 [IU] | INTRAMUSCULAR | Status: DC
  Administered 2022-11-06: 20000 [IU] via SUBCUTANEOUS

## 2022-11-06 MED ORDER — EPOETIN ALFA-EPBX 10000 UNIT/ML IJ SOLN
INTRAMUSCULAR | Status: AC
  Filled 2022-11-06: qty 2

## 2022-11-14 ENCOUNTER — Emergency Department (HOSPITAL_COMMUNITY): Payer: Medicare HMO

## 2022-11-14 ENCOUNTER — Encounter (HOSPITAL_COMMUNITY): Payer: Self-pay

## 2022-11-14 ENCOUNTER — Emergency Department (HOSPITAL_COMMUNITY)
Admission: EM | Admit: 2022-11-14 | Discharge: 2022-11-15 | Disposition: A | Discharge status: Home / Self Care | Payer: Medicare HMO | Attending: Emergency Medicine | Admitting: Emergency Medicine

## 2022-11-14 ENCOUNTER — Other Ambulatory Visit: Payer: Self-pay

## 2022-11-14 DIAGNOSIS — R410 Disorientation, unspecified: Secondary | ICD-10-CM | POA: Diagnosis not present

## 2022-11-14 DIAGNOSIS — R58 Hemorrhage, not elsewhere classified: Secondary | ICD-10-CM | POA: Diagnosis not present

## 2022-11-14 DIAGNOSIS — E162 Hypoglycemia, unspecified: Secondary | ICD-10-CM | POA: Diagnosis not present

## 2022-11-14 DIAGNOSIS — Z794 Long term (current) use of insulin: Secondary | ICD-10-CM | POA: Diagnosis not present

## 2022-11-14 DIAGNOSIS — R531 Weakness: Secondary | ICD-10-CM | POA: Diagnosis not present

## 2022-11-14 DIAGNOSIS — Z7982 Long term (current) use of aspirin: Secondary | ICD-10-CM | POA: Diagnosis not present

## 2022-11-14 DIAGNOSIS — E875 Hyperkalemia: Secondary | ICD-10-CM | POA: Diagnosis not present

## 2022-11-14 DIAGNOSIS — E161 Other hypoglycemia: Secondary | ICD-10-CM | POA: Diagnosis not present

## 2022-11-14 DIAGNOSIS — R4182 Altered mental status, unspecified: Secondary | ICD-10-CM | POA: Diagnosis not present

## 2022-11-14 DIAGNOSIS — R4781 Slurred speech: Secondary | ICD-10-CM | POA: Diagnosis not present

## 2022-11-14 DIAGNOSIS — E11649 Type 2 diabetes mellitus with hypoglycemia without coma: Secondary | ICD-10-CM | POA: Diagnosis not present

## 2022-11-14 DIAGNOSIS — N390 Urinary tract infection, site not specified: Secondary | ICD-10-CM | POA: Diagnosis not present

## 2022-11-14 LAB — CBC WITH DIFFERENTIAL/PLATELET
Abs Immature Granulocytes: 0.04 10*3/uL (ref 0.00–0.07)
Basophils Absolute: 0 10*3/uL (ref 0.0–0.1)
Basophils Relative: 0 %
Eosinophils Absolute: 0.2 10*3/uL (ref 0.0–0.5)
Eosinophils Relative: 2 %
HCT: 42.3 % (ref 36.0–46.0)
Hemoglobin: 12.7 g/dL (ref 12.0–15.0)
Immature Granulocytes: 0 %
Lymphocytes Relative: 14 %
Lymphs Abs: 1.7 10*3/uL (ref 0.7–4.0)
MCH: 30.5 pg (ref 26.0–34.0)
MCHC: 30 g/dL (ref 30.0–36.0)
MCV: 101.7 fL — ABNORMAL HIGH (ref 80.0–100.0)
Monocytes Absolute: 0.6 10*3/uL (ref 0.1–1.0)
Monocytes Relative: 5 %
Neutro Abs: 9.9 10*3/uL — ABNORMAL HIGH (ref 1.7–7.7)
Neutrophils Relative %: 79 %
Platelets: 163 10*3/uL (ref 150–400)
RBC: 4.16 MIL/uL (ref 3.87–5.11)
RDW: 14.1 % (ref 11.5–15.5)
WBC: 12.5 10*3/uL — ABNORMAL HIGH (ref 4.0–10.5)
nRBC: 0 % (ref 0.0–0.2)

## 2022-11-14 LAB — RAPID URINE DRUG SCREEN, HOSP PERFORMED
Amphetamines: NOT DETECTED
Barbiturates: NOT DETECTED
Benzodiazepines: NOT DETECTED
Cocaine: NOT DETECTED
Opiates: NOT DETECTED
Tetrahydrocannabinol: NOT DETECTED

## 2022-11-14 LAB — URINALYSIS, ROUTINE W REFLEX MICROSCOPIC
Bilirubin Urine: NEGATIVE
Glucose, UA: NEGATIVE mg/dL
Hgb urine dipstick: NEGATIVE
Ketones, ur: NEGATIVE mg/dL
Nitrite: NEGATIVE
Protein, ur: NEGATIVE mg/dL
Specific Gravity, Urine: 1.009 (ref 1.005–1.030)
pH: 5 (ref 5.0–8.0)

## 2022-11-14 LAB — COMPREHENSIVE METABOLIC PANEL
ALT: 18 U/L (ref 0–44)
AST: 28 U/L (ref 15–41)
Albumin: 4.1 g/dL (ref 3.5–5.0)
Alkaline Phosphatase: 82 U/L (ref 38–126)
Anion gap: 14 (ref 5–15)
BUN: 85 mg/dL — ABNORMAL HIGH (ref 8–23)
CO2: 18 mmol/L — ABNORMAL LOW (ref 22–32)
Calcium: 10 mg/dL (ref 8.9–10.3)
Chloride: 108 mmol/L (ref 98–111)
Creatinine, Ser: 3.8 mg/dL — ABNORMAL HIGH (ref 0.44–1.00)
GFR, Estimated: 12 mL/min — ABNORMAL LOW (ref >60)
Glucose, Bld: 104 mg/dL — ABNORMAL HIGH (ref 70–99)
Potassium: 5.5 mmol/L — ABNORMAL HIGH (ref 3.5–5.1)
Sodium: 140 mmol/L (ref 135–145)
Total Bilirubin: 0.7 mg/dL (ref 0.3–1.2)
Total Protein: 7.6 g/dL (ref 6.5–8.1)

## 2022-11-14 LAB — CBG MONITORING, ED
Glucose-Capillary: 124 mg/dL — ABNORMAL HIGH (ref 70–99)
Glucose-Capillary: 94 mg/dL (ref 70–99)

## 2022-11-14 LAB — LACTIC ACID, PLASMA: Lactic Acid, Venous: 1.7 mmol/L (ref 0.5–1.9)

## 2022-11-14 NOTE — ED Triage Notes (Signed)
PER EMS: pt is from home, family called EMS stating she was confused, had slurred speech and was hallucinating and saying she was seeing children. She has been sleeping all day but that is not abnormal for her. EMS reports she was barely responsive and only responded to sternal rub. Initial blood sugar was 50 with the fire dept. EMS arrived and checked a blood sugar and it was 125 without treatment. She reports numbness to her mouth. Pt appears very tired but she is alert and able to use her cell phone without difficulty.   CBG on arrival wasc56 and was given juice.

## 2022-11-14 NOTE — ED Provider Triage Note (Signed)
Emergency Medicine Provider Triage Evaluation Note  Kimberly Carlson , a 76 y.o. female  was evaluated in triage.  Pt complains of Fatigue.  Patient has been fatigued throughout the day, family is concerned has she has been confused, they report she was having hallucinations never happened before.  Feeling numbness to her lips but no lateralized weakness, headache or vision changes.  Oriented x 3.  Denies any changes in her medications.  EMS states she was hypoglycemic at 50 when they arrived.  Given D50 and to increase.  Review of Systems  Per HPI  Physical Exam  Ht 5\' 3"  (1.6 m)   Wt 80.3 kg   LMP  (LMP Unknown)   BMI 31.35 kg/m  Gen:   Awake, no distress   Resp:  Normal effort  MSK:   Moves extremities without difficulty  Other:  Cranial nerves II through XII are grossly intact.  Oriented x 3.  Grip strength symmetric bilaterally, able to raise with lower extremities.  Medical Decision Making  Medically screening exam initiated at 8:12 PM.  Appropriate orders placed.  Kimberly Carlson was informed that the remainder of the evaluation will be completed by another provider, this initial triage assessment does not replace that evaluation, and the importance of remaining in the ED until their evaluation is complete.     Sherrill Raring, PA-C 11/14/22 2015

## 2022-11-15 LAB — CBG MONITORING, ED: Glucose-Capillary: 192 mg/dL — ABNORMAL HIGH (ref 70–99)

## 2022-11-15 LAB — LACTIC ACID, PLASMA: Lactic Acid, Venous: 0.8 mmol/L (ref 0.5–1.9)

## 2022-11-15 LAB — GLUCOSE, CAPILLARY: Glucose-Capillary: 56 mg/dL — ABNORMAL LOW (ref 70–99)

## 2022-11-15 MED ORDER — SODIUM BICARBONATE 8.4 % IV SOLN
25.0000 meq | Freq: Once | INTRAVENOUS | Status: AC
  Administered 2022-11-15: 25 meq via INTRAVENOUS
  Filled 2022-11-15: qty 50

## 2022-11-15 MED ORDER — SODIUM ZIRCONIUM CYCLOSILICATE 5 G PO PACK
5.0000 g | PACK | Freq: Once | ORAL | Status: AC
  Administered 2022-11-15: 5 g via ORAL
  Filled 2022-11-15: qty 1

## 2022-11-15 MED ORDER — CEPHALEXIN 500 MG PO CAPS: 500.0000 mg | ORAL_CAPSULE | Freq: Four times a day (QID) | ORAL | 0 refills | Status: DC

## 2022-11-15 MED ORDER — SODIUM CHLORIDE 0.9 % IV BOLUS
500.0000 mL | Freq: Once | INTRAVENOUS | Status: AC
  Administered 2022-11-15: 500 mL via INTRAVENOUS

## 2022-11-15 MED ORDER — SODIUM CHLORIDE 0.9 % IV SOLN
2.0000 g | Freq: Once | INTRAVENOUS | Status: AC
  Administered 2022-11-15: 2 g via INTRAVENOUS
  Filled 2022-11-15: qty 20

## 2022-11-15 NOTE — ED Notes (Signed)
RN reviewed discharge instructions with pt. Pt verbalized understanding and had no further questions. VSS upon discharge.  

## 2022-11-16 LAB — URINE CULTURE

## 2022-11-16 NOTE — ED Provider Notes (Signed)
[facility] EMERGENCY DEPARTMENT Provider Note   CSN: [id number] Arrival date & time: 11/14/22  2004     History  Chief Complaint  Patient presents with   Hypoglycemia   Altered Mental Status    Kimberly Carlson is a 76 y.o. female.  76 year old female presents the ER today for altered mental status earlier today.  Patient woke up was confused and not like himself and her face looked abnormal.  She has history of TIAs and thought this might be what is going on they called EMS.  On EMS arrival her blood sugar was 50.  After being corrected her symptoms are pretty much resolved completely.  Still feels little bit tired and weak on my exam however had been in the ER for 6 hours at that time it was the middle of the night.  Has polyuria but no other urinary symptoms.  No fevers.  No other neurologic changes.   Hypoglycemia Associated symptoms: altered mental status   Altered Mental Status      Home Medications Prior to Admission medications   Medication Sig Start Date End Date Taking? Authorizing Provider  cephALEXin (KEFLEX) 500 MG capsule Take 1 capsule (500 mg total) by mouth 4 (four) times daily. 11/15/22  Yes Haeven Nickle, Corene Cornea, MD  ACCU-CHEK GUIDE test strip USE TO CHECK BLOOD SUGAR 6 TIMES PER DAY . 10/29/22   Donney Dice, DO  Accu-Chek Softclix Lancets lancets USE TO CHECK BLOOD SUGAR 6 TIMES PER DAY 06/06/22   Ganta, Anupa, DO  acetaminophen (TYLENOL) 500 MG tablet Take 1,000 mg by mouth every 6 (six) hours as needed (for headaches).    [provider]  aspirin 81 MG chewable tablet Chew 81 mg by mouth daily.    [provider]  atorvastatin (LIPITOR) 40 MG tablet TAKE 1 TABLET BY MOUTH EVERY DAY AT 6 PM 11/05/22   Ganta, Anupa, DO  Blood Glucose Monitoring Suppl (ACCU-CHEK GUIDE ME) w/Device KIT Use to check blood sugar 6x per day. E11.65 01/12/22   Dickie La, MD  calcium-vitamin D (OSCAL 500/200 D-3) 500-200 MG-UNIT tablet Take 2  tablets by mouth daily with breakfast. Patient taking differently: Take 1 tablet by mouth daily with breakfast. 02/27/16   Olam Idler, MD  carvedilol (COREG) 6.25 MG tablet Take 4 tablets (25 mg total) by mouth 2 (two) times daily. 07/03/18 07/03/19  Lucila Maine C, DO  diclofenac sodium (VOLTAREN) 1 % GEL Apply 4 g topically 4 (four) times daily. Patient not taking: No sig reported 07/09/18   Lucila Maine C, DO  fluticasone (FLONASE) 50 MCG/ACT nasal spray Place 2 sprays into both nostrils daily. 05/15/21   Couture, Cortni S, PA-C  furosemide (LASIX) 40 MG tablet Take 1 tablet (40 mg total) by mouth daily. 01/31/22   Ganta, Anupa, DO  gabapentin (NEURONTIN) 100 MG capsule Take 100 mg by mouth at bedtime.    [provider]  hydrALAZINE (APRESOLINE) 50 MG tablet TAKE 1 AND 1/2 TABLETS THREE TIMES DAILY Patient taking differently: 100 mg 3 (three) times daily. TAKE 1 AND 1/2 TABLETS THREE TIMES DAILY 10/30/21   Larae Grooms, Anupa, DO  insulin glargine (LANTUS) 100 UNIT/ML injection INJECT 28 UNITS UNDER THE SKIN EVERY DAY 08/30/22 11/25/22  Ganta, Anupa, DO  Insulin Syringe-Needle U-100 (BD INSULIN SYRINGE U/F) 31G X 5/16" 1 ML MISC USE EVERY DAY 03/16/22   Ganta, Anupa, DO  lidocaine (LIDODERM) 5 % Place 1 patch onto the skin daily. Remove & Discard patch  within 12 hours or as directed by MD 07/02/18   Matilde Haymaker, MD  polyethylene glycol [facility] / Floria Raveling) packet Take 17 g by mouth daily as needed for mild constipation. 02/06/18   Mayo, Pete Pelt, MD      Allergies    Amlodipine, Augmentin [amoxicillin-pot clavulanate], Clonidine derivatives, Doxycycline, and Metformin    Review of Systems   Review of Systems  Physical Exam Updated Vital Signs BP (!) 130/46 (BP Location: Right Arm)   Pulse 68   Temp 98.4 F (36.9 C) (Oral)   Resp 14   Ht _0  (1.6 m)   Wt 80.3 kg   LMP  (LMP Unknown)   SpO2 96%   BMI 31.35 kg/m  Physical Exam Vitals and nursing note reviewed.  Constitutional:       Appearance: She is well-developed.  HENT:     Head: Normocephalic and atraumatic.     Mouth/Throat:     Mouth: Mucous membranes are moist.     Pharynx: Oropharynx is clear.  Eyes:     Pupils: Pupils are equal, round, and reactive to light.  Cardiovascular:     Rate and Rhythm: Normal rate and regular rhythm.  Pulmonary:     Effort: No respiratory distress.     Breath sounds: No stridor.  Abdominal:     General: Abdomen is flat. There is no distension.  Musculoskeletal:     Cervical back: Normal range of motion.  Skin:    General: Skin is warm and dry.  Neurological:     Mental Status: She is alert.     Comments: Symmetric eyebrow raise, tongue protrusion, palatal elevation, smile, extraocular movements.  Normal speech.  Symmetric and appropriate grip strength, bicep flexion, tricep extension, dorsiflexion and plantarflexion.  Symmetric sensation to light touch in distal extremities.     ED Results / Procedures / Treatments   Labs (all labs ordered are listed, but only abnormal results are displayed) Labs Reviewed  COMPREHENSIVE METABOLIC PANEL - Abnormal; Notable for the following components:      Result Value   Potassium 5.5 (*)    CO2 18 (*)    Glucose, Bld 104 (*)    BUN 85 (*)    Creatinine, Ser 3.80 (*)    GFR, Estimated 12 (*)    All other components within normal limits  CBC WITH DIFFERENTIAL/PLATELET - Abnormal; Notable for the following components:   WBC 12.5 (*)    MCV 101.7 (*)    Neutro Abs 9.9 (*)    All other components within normal limits  URINALYSIS, ROUTINE W REFLEX MICROSCOPIC - Abnormal; Notable for the following components:   APPearance HAZY (*)    Leukocytes,Ua MODERATE (*)    Bacteria, UA RARE (*)    All other components within normal limits  CBG MONITORING, ED - Abnormal; Notable for the following components:   Glucose-Capillary 124 (*)    All other components within normal limits  CBG MONITORING, ED - Abnormal; Notable for the  following components:   Glucose-Capillary 192 (*)    All other components within normal limits  URINE CULTURE  RAPID URINE DRUG SCREEN, HOSP PERFORMED  LACTIC ACID, PLASMA  LACTIC ACID, PLASMA  CBG MONITORING, ED    EKG EKG Interpretation  Date/Time:  Wednesday November 14 2022 20:49:48 EST Ventricular Rate:  67 PR Interval:  170 QRS Duration: 86 QT Interval:  406 QTC Calculation: 429 R Axis:   25 Text Interpretation: Normal sinus rhythm Minimal voltage criteria for  LVH, may be normal variant ( R in aVL ) Nonspecific ST abnormality Abnormal ECG When compared with ECG of 30-Jun-2018 18:25, PREVIOUS ECG IS PRESENT Confirmed by Merrily Pew [phone]) on 11/15/2022 2:12:30 AM  Radiology DG Chest 2 View  Result Date: 11/14/2022 CLINICAL DATA:  Weakness EXAM: CHEST - 2 VIEW COMPARISON:  Chest x-ray 06/30/2018 FINDINGS: The heart size and mediastinal contours are within normal limits. Both lungs are clear. The visualized skeletal structures are unremarkable. IMPRESSION: No active cardiopulmonary disease. Electronically Signed   By: Ronney Asters M.D.   On: 11/14/2022 21:43   CT Head Wo Contrast  Result Date: 11/14/2022 CLINICAL DATA:  Mental status change, unknown cause. EXAM: CT HEAD WITHOUT CONTRAST TECHNIQUE: Contiguous axial images were obtained from the base of the skull through the vertex without intravenous contrast. RADIATION DOSE REDUCTION: This exam was performed according to the departmental dose-optimization program which includes automated exposure control, adjustment of the mA and/or kV according to patient size and/or use of iterative reconstruction technique. COMPARISON:  12/22/2017. FINDINGS: Brain: No acute intracranial hemorrhage, midline shift or mass effect. No extra-axial fluid collection. Mild diffuse atrophy is noted. Periventricular white matter hypodensities are present bilaterally. No hydrocephalus. Vascular: No hyperdense vessel or unexpected calcification. Skull:  Normal. Negative for fracture or focal lesion. Sinuses/Orbits: No acute finding. Other: None. IMPRESSION: 1. No acute intracranial process. 2. Atrophy with chronic microvascular ischemic changes. Electronically Signed   By: Brett Fairy M.D.   On: 11/14/2022 21:25    Procedures Procedures    Medications Ordered in ED Medications  cefTRIAXone (ROCEPHIN) 2 g in sodium chloride 0.9 % 100 mL IVPB (0 g Intravenous Stopped 11/15/22 0346)  sodium chloride 0.9 % bolus 500 mL (0 mLs Intravenous Stopped 11/15/22 0444)  sodium zirconium cyclosilicate (LOKELMA) packet 5 g (5 g Oral Given 11/15/22 0508)  sodium bicarbonate injection 25 mEq (25 mEq Intravenous Given 11/15/22 0509)    ED Course/ Medical Decision Making/ A&P                           Medical Decision Making Amount and/or Complexity of Data Reviewed Labs: ordered.  Risk Prescription drug management.   Patient had found to be mild really hyperkalemic, treated with Lokelma and bicarb and fluids.  Also found to have likely UTI as instigating factor for the hypoglycemia started on Rocephin and given Keflex at home.  Patient observed for couple hours in the ER with continued resolution of symptoms and no other complaints.  Will need PCP follow-up for urine and potassium recheck.  No EKG changes with other indication for admission to the hospital.  Final Clinical Impression(s) / ED Diagnoses Final diagnoses:  Hyperkalemia  Urinary tract infection without hematuria, site unspecified    Rx / DC Orders ED Discharge Orders          Ordered    cephALEXin (KEFLEX) 500 MG capsule  4 times daily        11/15/22 0627              Geet Hosking, Corene Cornea, MD 11/16/22 0011

## 2022-11-20 ENCOUNTER — Encounter (HOSPITAL_COMMUNITY)
Admission: RE | Admit: 2022-11-20 | Discharge: 2022-11-20 | Disposition: A | Discharge status: Home / Self Care | Payer: Medicare HMO | Source: Ambulatory Visit | Attending: Nephrology | Admitting: Nephrology

## 2022-11-20 VITALS — BP 141/59 | HR 66 | Temp 97.3°F | Resp 17

## 2022-11-20 DIAGNOSIS — N183 Chronic kidney disease, stage 3 unspecified: Secondary | ICD-10-CM

## 2022-11-20 DIAGNOSIS — N170 Acute kidney failure with tubular necrosis: Secondary | ICD-10-CM | POA: Diagnosis not present

## 2022-11-20 DIAGNOSIS — D631 Anemia in chronic kidney disease: Secondary | ICD-10-CM | POA: Diagnosis not present

## 2022-11-20 DIAGNOSIS — N189 Chronic kidney disease, unspecified: Secondary | ICD-10-CM | POA: Diagnosis not present

## 2022-11-20 MED ORDER — EPOETIN ALFA-EPBX 10000 UNIT/ML IJ SOLN
20000.0000 [IU] | INTRAMUSCULAR | Status: DC
  Administered 2022-11-20: 20000 [IU] via SUBCUTANEOUS

## 2022-11-20 MED ORDER — EPOETIN ALFA-EPBX 10000 UNIT/ML IJ SOLN
INTRAMUSCULAR | Status: AC
  Filled 2022-11-20: qty 2

## 2022-11-21 LAB — POCT HEMOGLOBIN-HEMACUE: Hemoglobin: 11.6 g/dL — ABNORMAL LOW (ref 12.0–15.0)

## 2022-11-30 ENCOUNTER — Ambulatory Visit (INDEPENDENT_AMBULATORY_CARE_PROVIDER_SITE_OTHER): Payer: Medicare HMO | Admitting: Family Medicine

## 2022-11-30 ENCOUNTER — Encounter: Payer: Self-pay | Admitting: Family Medicine

## 2022-11-30 VITALS — BP 144/55 | HR 77 | Ht 63.0 in | Wt 178.2 lb

## 2022-11-30 DIAGNOSIS — R350 Frequency of micturition: Secondary | ICD-10-CM | POA: Diagnosis not present

## 2022-11-30 DIAGNOSIS — E875 Hyperkalemia: Secondary | ICD-10-CM

## 2022-11-30 DIAGNOSIS — R399 Unspecified symptoms and signs involving the genitourinary system: Secondary | ICD-10-CM

## 2022-11-30 LAB — POCT URINALYSIS DIP (MANUAL ENTRY)
Bilirubin, UA: NEGATIVE
Blood, UA: NEGATIVE
Glucose, UA: NEGATIVE mg/dL
Ketones, POC UA: NEGATIVE mg/dL
Nitrite, UA: NEGATIVE
Protein Ur, POC: NEGATIVE mg/dL
Spec Grav, UA: 1.01 (ref 1.010–1.025)
Urobilinogen, UA: 0.2 E.U./dL
pH, UA: 5 (ref 5.0–8.0)

## 2022-11-30 LAB — POCT UA - MICROSCOPIC ONLY: RBC, Urine, Miroscopic: NONE SEEN (ref 0–2)

## 2022-11-30 NOTE — Assessment & Plan Note (Addendum)
-  likely multifactorial from chronic diuretic use and possible UTI -repeating UA with microscopy along with urine culture -instructed to maintain follow up with nephrology as she may be approaching need for dialysis within the next few years but Cr from last BMP seems to be stable  -discuss need for continued diuresis at future visits -follow up in 1 month for DM follow up

## 2022-11-30 NOTE — Assessment & Plan Note (Signed)
-  BMP noted to have K 5.5 recently which was treated with lokelma and bicarbonate, EKG appropriate -pending repeat BMP

## 2022-11-30 NOTE — Progress Notes (Signed)
    SUBJECTIVE:   CHIEF COMPLAINT / HPI:   Patient presents for follow up from the ED with UTI and elevated potassium levels. She experienced some mental status changes at the time and received antibiotics and treatment for her electrolyte abnormalities at the time. An EKG in the ED did not denote any changes as well. Today she feels better. She follows up with nephrologist, they are not planning for dialysis yet but have had discussions regarding this. Denies fever, chills and dysuria. Continues to have increased urinary frequency which she has some at baseline as she is on lasix. Denies hematuria, pelvic pain, abdominal pain, chest pain or dyspnea currently, but has some shortness of breath with activity as she is still recovering from her last ED visit. Completed Keflex course that she was given in the ED.   OBJECTIVE:   BP (!) 144/55   Pulse 77   Ht 5\' 3"  (1.6 m)   Wt 178 lb 4 oz (80.9 kg)   LMP  (LMP Unknown)   SpO2 97%   BMI 31.58 kg/m   General: Patient well-appearing, in no acute distress. CV: RRR, no murmurs or gallops auscultated Resp: CTAB, no wheezing, rales or rhonchi  Abdomen: soft, nontender, nondistended, presence of bowel sounds Ext: no LE edema noted bilaterally   ASSESSMENT/PLAN:   Hyperkalemia -BMP noted to have K 5.5 recently which was treated with lokelma and bicarbonate, EKG appropriate -pending repeat BMP  Increased urinary frequency -likely multifactorial from chronic diuretic use and possible UTI -repeating UA with microscopy along with urine culture -instructed to maintain follow up with nephrology as she may be approaching need for dialysis within the next few years but Cr from last BMP seems to be stable  -discuss need for continued diuresis at future visits -follow up in 1 month for DM follow up    -PHQ-9 score of 3 with negative question 9 reviewed.   Donney Dice, [place]

## 2022-11-30 NOTE — Patient Instructions (Signed)
It was great seeing you today!  Today we had a follow up from your last ED visit, I am glad that you are doing better. We will recheck a urine and blood work to make sure things look good after being in the hospital.   Please follow up at your next scheduled appointment in 1 month, if anything arises between now and then, please don't hesitate to contact our office.   Thank you for allowing [place] to be a part of your medical care!  Thank you, Dr. Larae Grooms  Also a reminder of our clinic's no-show policy. Please make sure to arrive at least 15 minutes prior to your scheduled appointment time. Please try to cancel before 24 hours if you are not able to make it. If you no-show for 2 appointments then you will be receiving a warning letter. If you no-show after 3 visits, then you may be at risk of being dismissed from our clinic. This is to ensure that everyone is able to be seen in a timely manner. Thank you, we appreciate your assistance with this!

## 2022-12-01 LAB — BASIC METABOLIC PANEL
BUN/Creatinine Ratio: 26 (ref 12–28)
BUN: 86 mg/dL (ref 8–27)
CO2: 19 mmol/L — ABNORMAL LOW (ref 20–29)
Calcium: 9.6 mg/dL (ref 8.7–10.3)
Chloride: 106 mmol/L (ref 96–106)
Creatinine, Ser: 3.3 mg/dL — ABNORMAL HIGH (ref 0.57–1.00)
Glucose: 109 mg/dL — ABNORMAL HIGH (ref 70–99)
Potassium: 4.8 mmol/L (ref 3.5–5.2)
Sodium: 142 mmol/L (ref 134–144)
eGFR: 14 mL/min/{1.73_m2} — ABNORMAL LOW (ref >59)

## 2022-12-03 LAB — URINE CULTURE

## 2022-12-04 ENCOUNTER — Encounter (HOSPITAL_COMMUNITY): Payer: Medicare HMO

## 2022-12-04 ENCOUNTER — Encounter (HOSPITAL_COMMUNITY): Payer: Self-pay

## 2022-12-05 ENCOUNTER — Other Ambulatory Visit: Payer: Self-pay | Admitting: Family Medicine

## 2022-12-05 ENCOUNTER — Encounter (HOSPITAL_COMMUNITY)
Admission: RE | Admit: 2022-12-05 | Discharge: 2022-12-05 | Disposition: A | Discharge status: Home / Self Care | Payer: Medicare PPO | Source: Ambulatory Visit | Attending: Nephrology | Admitting: Nephrology

## 2022-12-05 VITALS — BP 145/51 | HR 64 | Temp 97.1°F | Resp 17

## 2022-12-05 DIAGNOSIS — N170 Acute kidney failure with tubular necrosis: Secondary | ICD-10-CM | POA: Insufficient documentation

## 2022-12-05 DIAGNOSIS — N189 Chronic kidney disease, unspecified: Secondary | ICD-10-CM | POA: Insufficient documentation

## 2022-12-05 DIAGNOSIS — N183 Chronic kidney disease, stage 3 unspecified: Secondary | ICD-10-CM | POA: Insufficient documentation

## 2022-12-05 DIAGNOSIS — D631 Anemia in chronic kidney disease: Secondary | ICD-10-CM | POA: Insufficient documentation

## 2022-12-05 LAB — RENAL FUNCTION PANEL
Albumin: 3.7 g/dL (ref 3.5–5.0)
Anion gap: 9 (ref 5–15)
BUN: 70 mg/dL — ABNORMAL HIGH (ref 8–23)
CO2: 22 mmol/L (ref 22–32)
Calcium: 9.3 mg/dL (ref 8.9–10.3)
Chloride: 109 mmol/L (ref 98–111)
Creatinine, Ser: 2.91 mg/dL — ABNORMAL HIGH (ref 0.44–1.00)
GFR, Estimated: 16 mL/min — ABNORMAL LOW (ref >60)
Glucose, Bld: 50 mg/dL — ABNORMAL LOW (ref 70–99)
Phosphorus: 4.5 mg/dL (ref 2.5–4.6)
Potassium: 4.8 mmol/L (ref 3.5–5.1)
Sodium: 140 mmol/L (ref 135–145)

## 2022-12-05 LAB — MAGNESIUM: Magnesium: 1.7 mg/dL (ref 1.7–2.4)

## 2022-12-05 LAB — POCT HEMOGLOBIN-HEMACUE: Hemoglobin: 11.5 g/dL — ABNORMAL LOW (ref 12.0–15.0)

## 2022-12-05 LAB — CBC
HCT: 35.9 % — ABNORMAL LOW (ref 36.0–46.0)
Hemoglobin: 11 g/dL — ABNORMAL LOW (ref 12.0–15.0)
MCH: 31.1 pg (ref 26.0–34.0)
MCHC: 30.6 g/dL (ref 30.0–36.0)
MCV: 101.4 fL — ABNORMAL HIGH (ref 80.0–100.0)
Platelets: 133 10*3/uL — ABNORMAL LOW (ref 150–400)
RBC: 3.54 MIL/uL — ABNORMAL LOW (ref 3.87–5.11)
RDW: 13.2 % (ref 11.5–15.5)
WBC: 8.8 10*3/uL (ref 4.0–10.5)
nRBC: 0 % (ref 0.0–0.2)

## 2022-12-05 LAB — DIFFERENTIAL
Abs Immature Granulocytes: 0.01 10*3/uL (ref 0.00–0.07)
Basophils Absolute: 0 10*3/uL (ref 0.0–0.1)
Basophils Relative: 1 %
Eosinophils Absolute: 0.3 10*3/uL (ref 0.0–0.5)
Eosinophils Relative: 3 %
Immature Granulocytes: 0 %
Lymphocytes Relative: 24 %
Lymphs Abs: 2.1 10*3/uL (ref 0.7–4.0)
Monocytes Absolute: 0.7 10*3/uL (ref 0.1–1.0)
Monocytes Relative: 8 %
Neutro Abs: 5.7 10*3/uL (ref 1.7–7.7)
Neutrophils Relative %: 64 %

## 2022-12-05 LAB — IRON AND TIBC
Iron: 94 ug/dL (ref 28–170)
Saturation Ratios: 34 % — ABNORMAL HIGH (ref 10.4–31.8)
TIBC: 273 ug/dL (ref 250–450)
UIBC: 179 ug/dL

## 2022-12-05 LAB — FERRITIN: Ferritin: 218 ng/mL (ref 11–307)

## 2022-12-05 MED ORDER — EPOETIN ALFA-EPBX 10000 UNIT/ML IJ SOLN
20000.0000 [IU] | INTRAMUSCULAR | Status: DC
  Administered 2022-12-05: 20000 [IU] via SUBCUTANEOUS

## 2022-12-05 MED ORDER — EPOETIN ALFA-EPBX 10000 UNIT/ML IJ SOLN
INTRAMUSCULAR | Status: AC
  Filled 2022-12-05: qty 2

## 2022-12-07 ENCOUNTER — Encounter (HOSPITAL_COMMUNITY): Payer: Self-pay

## 2022-12-18 ENCOUNTER — Encounter (HOSPITAL_COMMUNITY)
Admission: RE | Admit: 2022-12-18 | Discharge: 2022-12-18 | Disposition: A | Discharge status: Home / Self Care | Payer: Medicare PPO | Source: Ambulatory Visit | Attending: Nephrology | Admitting: Nephrology

## 2022-12-18 VITALS — BP 151/50 | HR 69 | Temp 96.2°F

## 2022-12-18 DIAGNOSIS — D631 Anemia in chronic kidney disease: Secondary | ICD-10-CM | POA: Diagnosis not present

## 2022-12-18 DIAGNOSIS — N183 Chronic kidney disease, stage 3 unspecified: Secondary | ICD-10-CM | POA: Diagnosis not present

## 2022-12-18 DIAGNOSIS — N170 Acute kidney failure with tubular necrosis: Secondary | ICD-10-CM

## 2022-12-18 DIAGNOSIS — N189 Chronic kidney disease, unspecified: Secondary | ICD-10-CM | POA: Diagnosis not present

## 2022-12-18 LAB — POCT HEMOGLOBIN-HEMACUE: Hemoglobin: 11.4 g/dL — ABNORMAL LOW (ref 12.0–15.0)

## 2022-12-18 MED ORDER — EPOETIN ALFA-EPBX 10000 UNIT/ML IJ SOLN
INTRAMUSCULAR | Status: AC
  Filled 2022-12-18: qty 2

## 2022-12-18 MED ORDER — EPOETIN ALFA-EPBX 10000 UNIT/ML IJ SOLN
20000.0000 [IU] | INTRAMUSCULAR | Status: DC
  Administered 2022-12-18: 20000 [IU] via SUBCUTANEOUS

## 2022-12-31 ENCOUNTER — Encounter: Payer: Self-pay | Admitting: Family Medicine

## 2022-12-31 ENCOUNTER — Ambulatory Visit (INDEPENDENT_AMBULATORY_CARE_PROVIDER_SITE_OTHER): Payer: Medicare PPO | Admitting: Family Medicine

## 2022-12-31 VITALS — BP 147/53 | HR 75 | Ht 63.0 in | Wt 184.2 lb

## 2022-12-31 DIAGNOSIS — Z794 Long term (current) use of insulin: Secondary | ICD-10-CM | POA: Diagnosis not present

## 2022-12-31 DIAGNOSIS — E1122 Type 2 diabetes mellitus with diabetic chronic kidney disease: Secondary | ICD-10-CM

## 2022-12-31 DIAGNOSIS — I1A0 Resistant hypertension: Secondary | ICD-10-CM | POA: Diagnosis not present

## 2022-12-31 LAB — POCT GLYCOSYLATED HEMOGLOBIN (HGB A1C): HbA1c, POC (controlled diabetic range): 5.3 % (ref 0.0–7.0)

## 2022-12-31 NOTE — Assessment & Plan Note (Signed)
-  A1c 5.3, at goal -discussed titrating insulin down which patient agrees, discussed this extensively with patient  -foot exam normal -continue statin therapy -instructed patient to schedule ophthalmology follow up  -diet and exercise counseling provided -follow up in 2 weeks

## 2022-12-31 NOTE — Progress Notes (Signed)
    SUBJECTIVE:   CHIEF COMPLAINT / HPI:   Patient presents for DM follow up, current regimen includes lantus 28 units daily which she endorses compliance on. CBGs range around 160-170s recently. Last saw the ophthalmologist a year ago and needs to schedule another follow up. Gabapentin for neuropathy which remains stable and has not worsened. Typical diet consists of not things that she should be eating, she admits she is trying to do better. Eats vegetables.   Denies chest pain, worsening dyspnea or leg swelling. Taking lasix so does not have swelling. Denies headaches, weakness, vision changes or other symptoms. She has taken her BP meds this morning. Complaint on coreg and hydralazine.   OBJECTIVE:   BP (!) 147/53   Pulse 75   Ht 5\' 3"  (1.6 m)   Wt 184 lb 3.2 oz (83.6 kg)   LMP  (LMP Unknown)   SpO2 98%   BMI 32.63 kg/m   General: Patient well-appearing, in no acute distress. CV: RRR, no murmurs or gallops auscultated Resp: CTAB, no wheezing, rales or rhonchi noted Ext: no LE edema noted bilaterally, normal foot exam without ulcers or wounds noted Neuro: normal microfilament testing, gross sensation intact  Psych: mood appropriate   ASSESSMENT/PLAN:   Resistant hypertension -BP 164/56 and on repeat 147/53 -continue coreg and hydralazine -instructed to maintain BP log and bring to next visit  -follow up in 2 weeks  DM2 (diabetes mellitus, type 2) ([place]) -A1c 5.3, at goal -discussed titrating insulin down which patient agrees, discussed this extensively with patient  -foot exam normal -continue statin therapy -instructed patient to schedule ophthalmology follow up  -diet and exercise counseling provided -follow up in 2 weeks   -Med rec reviewed and updated appropriately  -PHQ-9 score of 1 with negative question 9 reviewed.   Donney Dice, [place]

## 2022-12-31 NOTE — Assessment & Plan Note (Signed)
-  BP 164/56 and on repeat 147/53 -continue coreg and hydralazine -instructed to maintain BP log and bring to next visit  -follow up in 2 weeks

## 2022-12-31 NOTE — Patient Instructions (Addendum)
It was great seeing you today!  Today we discussed your diabetes, congratulations on having an A1c of 5.3! This is great news. Please try to incorporate more healthy foods and continued to eat plenty of vegetables. Please schedule an appointment to see the eye doctor at your earliest convenience. As we discussed, we can go down on your insulin by 1 unit a day for any glucose under 180. For any sugar over 180, please continue to prior day's insulin. We will go down to 20 units and see how you do with this. Please check sugars at least twice a day and bring this record into your next visit.   Please continue to take your stain medication.   Your blood pressure was a little high, please check this at least twice a day and record it. Bring this record into your next visit.   Please follow up at your next scheduled appointment in 2 weeks, if anything arises between now and then, please don't hesitate to contact our office.   Thank you for allowing [place] to be a part of your medical care!  Thank you, Dr. Larae Grooms  Also a reminder of our clinic's no-show policy. Please make sure to arrive at least 15 minutes prior to your scheduled appointment time. Please try to cancel before 24 hours if you are not able to make it. If you no-show for 2 appointments then you will be receiving a warning letter. If you no-show after 3 visits, then you may be at risk of being dismissed from our clinic. This is to ensure that everyone is able to be seen in a timely manner. Thank you, we appreciate your assistance with this!

## 2023-01-01 ENCOUNTER — Encounter (HOSPITAL_COMMUNITY)
Admission: RE | Admit: 2023-01-01 | Discharge: 2023-01-01 | Disposition: A | Discharge status: Home / Self Care | Payer: Medicare PPO | Source: Ambulatory Visit | Attending: Nephrology | Admitting: Nephrology

## 2023-01-01 VITALS — BP 145/56 | HR 64 | Temp 97.1°F | Resp 17

## 2023-01-01 DIAGNOSIS — N189 Chronic kidney disease, unspecified: Secondary | ICD-10-CM | POA: Diagnosis not present

## 2023-01-01 DIAGNOSIS — D631 Anemia in chronic kidney disease: Secondary | ICD-10-CM | POA: Insufficient documentation

## 2023-01-01 DIAGNOSIS — N183 Chronic kidney disease, stage 3 unspecified: Secondary | ICD-10-CM | POA: Insufficient documentation

## 2023-01-01 DIAGNOSIS — N170 Acute kidney failure with tubular necrosis: Secondary | ICD-10-CM | POA: Diagnosis not present

## 2023-01-01 LAB — DIFFERENTIAL
Abs Immature Granulocytes: 0.02 10*3/uL (ref 0.00–0.07)
Basophils Absolute: 0.1 10*3/uL (ref 0.0–0.1)
Basophils Relative: 1 %
Eosinophils Absolute: 0.3 10*3/uL (ref 0.0–0.5)
Eosinophils Relative: 4 %
Immature Granulocytes: 0 %
Lymphocytes Relative: 28 %
Lymphs Abs: 2.1 10*3/uL (ref 0.7–4.0)
Monocytes Absolute: 0.6 10*3/uL (ref 0.1–1.0)
Monocytes Relative: 7 %
Neutro Abs: 4.6 10*3/uL (ref 1.7–7.7)
Neutrophils Relative %: 60 %

## 2023-01-01 LAB — RENAL FUNCTION PANEL
Albumin: 3.4 g/dL — ABNORMAL LOW (ref 3.5–5.0)
Anion gap: 10 (ref 5–15)
BUN: 74 mg/dL — ABNORMAL HIGH (ref 8–23)
CO2: 20 mmol/L — ABNORMAL LOW (ref 22–32)
Calcium: 9.1 mg/dL (ref 8.9–10.3)
Chloride: 108 mmol/L (ref 98–111)
Creatinine, Ser: 3.21 mg/dL — ABNORMAL HIGH (ref 0.44–1.00)
GFR, Estimated: 14 mL/min — ABNORMAL LOW (ref >60)
Glucose, Bld: 86 mg/dL (ref 70–99)
Phosphorus: 5 mg/dL — ABNORMAL HIGH (ref 2.5–4.6)
Potassium: 4.6 mmol/L (ref 3.5–5.1)
Sodium: 138 mmol/L (ref 135–145)

## 2023-01-01 LAB — IRON AND TIBC
Iron: 107 ug/dL (ref 28–170)
Saturation Ratios: 43 % — ABNORMAL HIGH (ref 10.4–31.8)
TIBC: 249 ug/dL — ABNORMAL LOW (ref 250–450)
UIBC: 142 ug/dL

## 2023-01-01 LAB — CBC
HCT: 35.6 % — ABNORMAL LOW (ref 36.0–46.0)
Hemoglobin: 11.2 g/dL — ABNORMAL LOW (ref 12.0–15.0)
MCH: 31.3 pg (ref 26.0–34.0)
MCHC: 31.5 g/dL (ref 30.0–36.0)
MCV: 99.4 fL (ref 80.0–100.0)
Platelets: 129 10*3/uL — ABNORMAL LOW (ref 150–400)
RBC: 3.58 MIL/uL — ABNORMAL LOW (ref 3.87–5.11)
RDW: 13.3 % (ref 11.5–15.5)
WBC: 7.5 10*3/uL (ref 4.0–10.5)
nRBC: 0 % (ref 0.0–0.2)

## 2023-01-01 LAB — POCT HEMOGLOBIN-HEMACUE: Hemoglobin: 11.2 g/dL — ABNORMAL LOW (ref 12.0–15.0)

## 2023-01-01 LAB — MAGNESIUM: Magnesium: 1.6 mg/dL — ABNORMAL LOW (ref 1.7–2.4)

## 2023-01-01 LAB — FERRITIN: Ferritin: 148 ng/mL (ref 11–307)

## 2023-01-01 MED ORDER — EPOETIN ALFA-EPBX 10000 UNIT/ML IJ SOLN
20000.0000 [IU] | INTRAMUSCULAR | Status: DC
  Administered 2023-01-01: 20000 [IU] via SUBCUTANEOUS

## 2023-01-01 MED ORDER — EPOETIN ALFA-EPBX 10000 UNIT/ML IJ SOLN
INTRAMUSCULAR | Status: AC
  Filled 2023-01-01: qty 2

## 2023-01-03 ENCOUNTER — Other Ambulatory Visit: Payer: Self-pay | Admitting: Family Medicine

## 2023-01-10 ENCOUNTER — Other Ambulatory Visit: Payer: Self-pay | Admitting: Family Medicine

## 2023-01-10 ENCOUNTER — Telehealth: Payer: Self-pay | Admitting: *Deleted

## 2023-01-10 NOTE — Patient Outreach (Signed)
  Care Coordination   01/10/2023 Name: Kimberly Carlson MRN: [id number] DOB: 04/03/1946   Care Coordination Outreach Attempts:  An unsuccessful telephone outreach was attempted today to offer the patient information about available care coordination services as a benefit of their health plan.   Follow Up Plan:  Additional outreach attempts will be made to offer the patient care coordination information and services.   Encounter Outcome:  No Answer   Care Coordination Interventions:  No, not indicated    Eduard Clos, MSW, [place] Worker [employer] [employer] [phone]

## 2023-01-14 ENCOUNTER — Ambulatory Visit (INDEPENDENT_AMBULATORY_CARE_PROVIDER_SITE_OTHER): Payer: Medicare PPO | Admitting: Family Medicine

## 2023-01-14 ENCOUNTER — Encounter: Payer: Self-pay | Admitting: Family Medicine

## 2023-01-14 VITALS — BP 157/61 | HR 74 | Ht 63.0 in | Wt 182.5 lb

## 2023-01-14 DIAGNOSIS — I10 Essential (primary) hypertension: Secondary | ICD-10-CM

## 2023-01-14 DIAGNOSIS — M79606 Pain in leg, unspecified: Secondary | ICD-10-CM | POA: Diagnosis not present

## 2023-01-14 NOTE — Progress Notes (Signed)
    SUBJECTIVE:   CHIEF COMPLAINT / HPI:   Patient with history of hypertension presents for BP follow up. Compliant on coreg and hydralazine twice daily.Denies chest pain, dyspnea or worsening leg swelling. She took her meds this morning, she checked her BP this morning at home and it was 138/57. Home BP have ranged between systolic [id number] and diastolic [id number].   Reports right sided leg pain a week ago when she lifted her granddaughter's wheelchair. Pain occurs occasionally, certain movements and positions make it worse. Aching sensation down her right leg. Sometimes has numbness and tingling going down to her foot.   OBJECTIVE:   BP (!) 157/61   Pulse 74   Ht 5' 3"$  (1.6 m)   Wt 182 lb 8 oz (82.8 kg)   LMP  (LMP Unknown)   SpO2 95%   BMI 32.33 kg/m   General: Patient well-appearing, in no acute distress. CV: RRR, no murmurs or gallops auscultated Resp: CTAB, no wheezing, rales or rhonchi noted MSK: no gross deformity noted along legs bilaterally, no edema or erythema noted, no rash noted, no point tenderness or crepitus noted, full active ROM noted bilaterally along knees and hips Neuro: 5/5 LE strength bilaterally, gross sensation intact, normal gait with assistance of cane   ASSESSMENT/PLAN:   Hypertension -BP 180/59, on repeat improved to 157/61 -reassuringly asymptomatic -concern for white coat hypertension, I feel I would like more data before adjusting her antihypertensive regimen so follow up scheduled with Dr. Valentina Lucks for ambulatory BP monitoring. Concerned at this time to adjust regimen as her diastolics are a bit on the low side  -follow up with PCP In 2 weeks   Leg pain -symptoms likely secondary to recent inciting injury -sciatica rehab handout provided as these stretching exercises best fit what she needs -follow up in 2 weeks and consider PT referral if no improvement demonstrated   -PHQ-9 score of 0 reviewed.    Donney Dice, [place]

## 2023-01-14 NOTE — Assessment & Plan Note (Signed)
-  symptoms likely secondary to recent inciting injury -sciatica rehab handout provided as these stretching exercises best fit what she needs -follow up in 2 weeks and consider PT referral if no improvement demonstrated

## 2023-01-14 NOTE — Patient Instructions (Addendum)
It was great seeing you today!  Today we discussed your blood pressure, it is high so I wanted to get more data to check your blood pressures over a 24 hour period before making a decision on changing your blood pressure medications. For now, continue to take coreg and hydralazine as prescribed.  Attached are some exercises for your leg pain, if these do not help then we can place a referral to physical therapy.  Please see Dr. Valentina Lucks on 2/27 at 9:45 am.   Please follow up at your next scheduled appointment in 2 weeks, if anything arises between now and then, please don't hesitate to contact our office.   Thank you for allowing [place] to be a part of your medical care!  Thank you, Dr. Larae Grooms  Also a reminder of our clinic's no-show policy. Please make sure to arrive at least 15 minutes prior to your scheduled appointment time. Please try to cancel before 24 hours if you are not able to make it. If you no-show for 2 appointments then you will be receiving a warning letter. If you no-show after 3 visits, then you may be at risk of being dismissed from our clinic. This is to ensure that everyone is able to be seen in a timely manner. Thank you, we appreciate your assistance with this!

## 2023-01-14 NOTE — Assessment & Plan Note (Signed)
-  BP 180/59, on repeat improved to 157/61 -reassuringly asymptomatic -concern for white coat hypertension, I feel I would like more data before adjusting her antihypertensive regimen so follow up scheduled with Dr. Valentina Lucks for ambulatory BP monitoring. Concerned at this time to adjust regimen as her diastolics are a bit on the low side  -follow up with PCP In 2 weeks

## 2023-01-15 ENCOUNTER — Encounter (HOSPITAL_COMMUNITY)
Admission: RE | Admit: 2023-01-15 | Discharge: 2023-01-15 | Disposition: A | Discharge status: Home / Self Care | Payer: Medicare PPO | Source: Ambulatory Visit | Attending: Nephrology | Admitting: Nephrology

## 2023-01-15 VITALS — BP 144/49 | HR 67 | Temp 97.1°F | Resp 17

## 2023-01-15 DIAGNOSIS — N170 Acute kidney failure with tubular necrosis: Secondary | ICD-10-CM

## 2023-01-15 DIAGNOSIS — N189 Chronic kidney disease, unspecified: Secondary | ICD-10-CM

## 2023-01-15 DIAGNOSIS — N183 Chronic kidney disease, stage 3 unspecified: Secondary | ICD-10-CM | POA: Diagnosis not present

## 2023-01-15 DIAGNOSIS — D631 Anemia in chronic kidney disease: Secondary | ICD-10-CM | POA: Diagnosis not present

## 2023-01-15 LAB — POCT HEMOGLOBIN-HEMACUE: Hemoglobin: 11.2 g/dL — ABNORMAL LOW (ref 12.0–15.0)

## 2023-01-15 MED ORDER — EPOETIN ALFA-EPBX 10000 UNIT/ML IJ SOLN
20000.0000 [IU] | INTRAMUSCULAR | Status: DC
  Administered 2023-01-15: 20000 [IU] via SUBCUTANEOUS

## 2023-01-15 MED ORDER — EPOETIN ALFA-EPBX 10000 UNIT/ML IJ SOLN
INTRAMUSCULAR | Status: AC
  Filled 2023-01-15: qty 2

## 2023-01-22 ENCOUNTER — Ambulatory Visit: Payer: Medicare PPO | Admitting: Pharmacist

## 2023-01-22 ENCOUNTER — Telehealth: Payer: Self-pay | Admitting: Pharmacist

## 2023-01-22 NOTE — Telephone Encounter (Signed)
Attempted to contact patient RE missed appointment for Ambulatory BP monitoring.   Left message requesting her to reschedule by calling main number  [phone].   Informed her that tomorrow was not available as another patient had been scheduled.

## 2023-01-23 ENCOUNTER — Ambulatory Visit: Payer: Medicare PPO | Admitting: Pharmacist

## 2023-01-28 ENCOUNTER — Ambulatory Visit: Payer: Self-pay | Admitting: Family Medicine

## 2023-01-29 ENCOUNTER — Encounter (HOSPITAL_COMMUNITY)
Admission: RE | Admit: 2023-01-29 | Discharge: 2023-01-29 | Disposition: A | Discharge status: Home / Self Care | Payer: Medicare PPO | Source: Ambulatory Visit | Attending: Nephrology | Admitting: Nephrology

## 2023-01-29 VITALS — BP 159/57 | HR 67 | Temp 97.1°F | Resp 17

## 2023-01-29 DIAGNOSIS — N170 Acute kidney failure with tubular necrosis: Secondary | ICD-10-CM | POA: Insufficient documentation

## 2023-01-29 DIAGNOSIS — N183 Chronic kidney disease, stage 3 unspecified: Secondary | ICD-10-CM | POA: Insufficient documentation

## 2023-01-29 DIAGNOSIS — R300 Dysuria: Secondary | ICD-10-CM | POA: Diagnosis not present

## 2023-01-29 DIAGNOSIS — D631 Anemia in chronic kidney disease: Secondary | ICD-10-CM | POA: Insufficient documentation

## 2023-01-29 DIAGNOSIS — Z87440 Personal history of urinary (tract) infections: Secondary | ICD-10-CM | POA: Diagnosis not present

## 2023-01-29 DIAGNOSIS — R350 Frequency of micturition: Secondary | ICD-10-CM | POA: Diagnosis not present

## 2023-01-29 DIAGNOSIS — N189 Chronic kidney disease, unspecified: Secondary | ICD-10-CM | POA: Insufficient documentation

## 2023-01-29 DIAGNOSIS — M5416 Radiculopathy, lumbar region: Secondary | ICD-10-CM | POA: Diagnosis not present

## 2023-01-29 LAB — RENAL FUNCTION PANEL
Albumin: 3.6 g/dL (ref 3.5–5.0)
Anion gap: 11 (ref 5–15)
BUN: 78 mg/dL — ABNORMAL HIGH (ref 8–23)
CO2: 21 mmol/L — ABNORMAL LOW (ref 22–32)
Calcium: 9.4 mg/dL (ref 8.9–10.3)
Chloride: 107 mmol/L (ref 98–111)
Creatinine, Ser: 3.23 mg/dL — ABNORMAL HIGH (ref 0.44–1.00)
GFR, Estimated: 14 mL/min — ABNORMAL LOW (ref >60)
Glucose, Bld: 97 mg/dL (ref 70–99)
Phosphorus: 5.2 mg/dL — ABNORMAL HIGH (ref 2.5–4.6)
Potassium: 4.3 mmol/L (ref 3.5–5.1)
Sodium: 139 mmol/L (ref 135–145)

## 2023-01-29 LAB — DIFFERENTIAL
Abs Immature Granulocytes: 0.01 10*3/uL (ref 0.00–0.07)
Basophils Absolute: 0 10*3/uL (ref 0.0–0.1)
Basophils Relative: 1 %
Eosinophils Absolute: 0.3 10*3/uL (ref 0.0–0.5)
Eosinophils Relative: 3 %
Immature Granulocytes: 0 %
Lymphocytes Relative: 29 %
Lymphs Abs: 2.2 10*3/uL (ref 0.7–4.0)
Monocytes Absolute: 0.6 10*3/uL (ref 0.1–1.0)
Monocytes Relative: 8 %
Neutro Abs: 4.4 10*3/uL (ref 1.7–7.7)
Neutrophils Relative %: 59 %

## 2023-01-29 LAB — CBC
HCT: 35.6 % — ABNORMAL LOW (ref 36.0–46.0)
Hemoglobin: 11.2 g/dL — ABNORMAL LOW (ref 12.0–15.0)
MCH: 31 pg (ref 26.0–34.0)
MCHC: 31.5 g/dL (ref 30.0–36.0)
MCV: 98.6 fL (ref 80.0–100.0)
Platelets: 129 10*3/uL — ABNORMAL LOW (ref 150–400)
RBC: 3.61 MIL/uL — ABNORMAL LOW (ref 3.87–5.11)
RDW: 13.5 % (ref 11.5–15.5)
WBC: 7.5 10*3/uL (ref 4.0–10.5)
nRBC: 0 % (ref 0.0–0.2)

## 2023-01-29 LAB — MAGNESIUM: Magnesium: 1.7 mg/dL (ref 1.7–2.4)

## 2023-01-29 LAB — IRON AND TIBC
Iron: 79 ug/dL (ref 28–170)
Saturation Ratios: 29 % (ref 10.4–31.8)
TIBC: 269 ug/dL (ref 250–450)
UIBC: 190 ug/dL

## 2023-01-29 LAB — FERRITIN: Ferritin: 156 ng/mL (ref 11–307)

## 2023-01-29 MED ORDER — EPOETIN ALFA-EPBX 10000 UNIT/ML IJ SOLN
20000.0000 [IU] | INTRAMUSCULAR | Status: DC
  Administered 2023-01-29: 20000 [IU] via SUBCUTANEOUS

## 2023-01-29 MED ORDER — EPOETIN ALFA-EPBX 10000 UNIT/ML IJ SOLN
INTRAMUSCULAR | Status: AC
  Filled 2023-01-29: qty 2

## 2023-01-30 LAB — POCT HEMOGLOBIN-HEMACUE: Hemoglobin: 10.2 g/dL — ABNORMAL LOW (ref 12.0–15.0)

## 2023-02-12 ENCOUNTER — Encounter (HOSPITAL_COMMUNITY)
Admission: RE | Admit: 2023-02-12 | Discharge: 2023-02-12 | Disposition: A | Discharge status: Home / Self Care | Payer: Medicare PPO | Source: Ambulatory Visit | Attending: Nephrology | Admitting: Nephrology

## 2023-02-12 VITALS — BP 172/58 | HR 69 | Temp 97.3°F | Resp 17

## 2023-02-12 DIAGNOSIS — D631 Anemia in chronic kidney disease: Secondary | ICD-10-CM

## 2023-02-12 DIAGNOSIS — N170 Acute kidney failure with tubular necrosis: Secondary | ICD-10-CM | POA: Diagnosis not present

## 2023-02-12 DIAGNOSIS — N183 Chronic kidney disease, stage 3 unspecified: Secondary | ICD-10-CM | POA: Diagnosis not present

## 2023-02-12 DIAGNOSIS — N189 Chronic kidney disease, unspecified: Secondary | ICD-10-CM | POA: Diagnosis not present

## 2023-02-12 LAB — POCT HEMOGLOBIN-HEMACUE: Hemoglobin: 11.7 g/dL — ABNORMAL LOW (ref 12.0–15.0)

## 2023-02-12 MED ORDER — EPOETIN ALFA-EPBX 10000 UNIT/ML IJ SOLN
INTRAMUSCULAR | Status: AC
  Filled 2023-02-12: qty 2

## 2023-02-12 MED ORDER — EPOETIN ALFA-EPBX 10000 UNIT/ML IJ SOLN
20000.0000 [IU] | INTRAMUSCULAR | Status: DC
  Administered 2023-02-12: 20000 [IU] via SUBCUTANEOUS

## 2023-02-13 ENCOUNTER — Ambulatory Visit: Payer: Medicare PPO | Admitting: Pharmacist

## 2023-02-14 ENCOUNTER — Ambulatory Visit: Payer: Medicare PPO | Admitting: Pharmacist

## 2023-02-26 ENCOUNTER — Encounter (HOSPITAL_COMMUNITY)
Admission: RE | Admit: 2023-02-26 | Discharge: 2023-02-26 | Disposition: A | Discharge status: Home / Self Care | Payer: Medicare PPO | Source: Ambulatory Visit | Attending: Nephrology | Admitting: Nephrology

## 2023-02-26 VITALS — BP 178/61 | HR 70 | Temp 97.3°F | Resp 17

## 2023-02-26 DIAGNOSIS — N170 Acute kidney failure with tubular necrosis: Secondary | ICD-10-CM | POA: Diagnosis not present

## 2023-02-26 DIAGNOSIS — D631 Anemia in chronic kidney disease: Secondary | ICD-10-CM | POA: Insufficient documentation

## 2023-02-26 DIAGNOSIS — N183 Chronic kidney disease, stage 3 unspecified: Secondary | ICD-10-CM | POA: Diagnosis not present

## 2023-02-26 DIAGNOSIS — N189 Chronic kidney disease, unspecified: Secondary | ICD-10-CM | POA: Diagnosis not present

## 2023-02-26 LAB — RENAL FUNCTION PANEL
Albumin: 3.6 g/dL (ref 3.5–5.0)
Anion gap: 11 (ref 5–15)
BUN: 72 mg/dL — ABNORMAL HIGH (ref 8–23)
CO2: 18 mmol/L — ABNORMAL LOW (ref 22–32)
Calcium: 9 mg/dL (ref 8.9–10.3)
Chloride: 110 mmol/L (ref 98–111)
Creatinine, Ser: 3.04 mg/dL — ABNORMAL HIGH (ref 0.44–1.00)
GFR, Estimated: 15 mL/min — ABNORMAL LOW (ref >60)
Glucose, Bld: 107 mg/dL — ABNORMAL HIGH (ref 70–99)
Phosphorus: 4.7 mg/dL — ABNORMAL HIGH (ref 2.5–4.6)
Potassium: 4.7 mmol/L (ref 3.5–5.1)
Sodium: 139 mmol/L (ref 135–145)

## 2023-02-26 LAB — CBC
HCT: 36.7 % (ref 36.0–46.0)
Hemoglobin: 11.8 g/dL — ABNORMAL LOW (ref 12.0–15.0)
MCH: 31.6 pg (ref 26.0–34.0)
MCHC: 32.2 g/dL (ref 30.0–36.0)
MCV: 98.4 fL (ref 80.0–100.0)
Platelets: 126 10*3/uL — ABNORMAL LOW (ref 150–400)
RBC: 3.73 MIL/uL — ABNORMAL LOW (ref 3.87–5.11)
RDW: 13.2 % (ref 11.5–15.5)
WBC: 8.2 10*3/uL (ref 4.0–10.5)
nRBC: 0 % (ref 0.0–0.2)

## 2023-02-26 LAB — DIFFERENTIAL
Abs Immature Granulocytes: 0.02 10*3/uL (ref 0.00–0.07)
Basophils Absolute: 0.1 10*3/uL (ref 0.0–0.1)
Basophils Relative: 1 %
Eosinophils Absolute: 0.3 10*3/uL (ref 0.0–0.5)
Eosinophils Relative: 3 %
Immature Granulocytes: 0 %
Lymphocytes Relative: 28 %
Lymphs Abs: 2.3 10*3/uL (ref 0.7–4.0)
Monocytes Absolute: 0.6 10*3/uL (ref 0.1–1.0)
Monocytes Relative: 8 %
Neutro Abs: 4.9 10*3/uL (ref 1.7–7.7)
Neutrophils Relative %: 60 %

## 2023-02-26 LAB — IRON AND TIBC
Iron: 92 ug/dL (ref 28–170)
Saturation Ratios: 33 % — ABNORMAL HIGH (ref 10.4–31.8)
TIBC: 280 ug/dL (ref 250–450)
UIBC: 188 ug/dL

## 2023-02-26 LAB — POCT HEMOGLOBIN-HEMACUE: Hemoglobin: 11.7 g/dL — ABNORMAL LOW (ref 12.0–15.0)

## 2023-02-26 LAB — FERRITIN: Ferritin: 155 ng/mL (ref 11–307)

## 2023-02-26 LAB — MAGNESIUM: Magnesium: 1.7 mg/dL (ref 1.7–2.4)

## 2023-02-26 MED ORDER — EPOETIN ALFA-EPBX 10000 UNIT/ML IJ SOLN: 20000.0000 [IU] | INTRAMUSCULAR | Status: DC

## 2023-02-26 MED ORDER — EPOETIN ALFA-EPBX 10000 UNIT/ML IJ SOLN
INTRAMUSCULAR | Status: AC
  Administered 2023-02-26: 20000 [IU] via SUBCUTANEOUS
  Filled 2023-02-26: qty 2

## 2023-03-12 ENCOUNTER — Encounter (HOSPITAL_COMMUNITY)
Admission: RE | Admit: 2023-03-12 | Discharge: 2023-03-12 | Disposition: A | Discharge status: Home / Self Care | Payer: Medicare PPO | Source: Ambulatory Visit | Attending: Nephrology | Admitting: Nephrology

## 2023-03-12 VITALS — BP 154/54 | HR 67 | Temp 97.1°F | Resp 17

## 2023-03-12 DIAGNOSIS — N170 Acute kidney failure with tubular necrosis: Secondary | ICD-10-CM

## 2023-03-12 DIAGNOSIS — D631 Anemia in chronic kidney disease: Secondary | ICD-10-CM | POA: Diagnosis not present

## 2023-03-12 DIAGNOSIS — N189 Chronic kidney disease, unspecified: Secondary | ICD-10-CM | POA: Diagnosis not present

## 2023-03-12 DIAGNOSIS — N183 Chronic kidney disease, stage 3 unspecified: Secondary | ICD-10-CM | POA: Diagnosis not present

## 2023-03-12 LAB — POCT HEMOGLOBIN-HEMACUE: Hemoglobin: 11.7 g/dL — ABNORMAL LOW (ref 12.0–15.0)

## 2023-03-12 MED ORDER — EPOETIN ALFA-EPBX 10000 UNIT/ML IJ SOLN
20000.0000 [IU] | INTRAMUSCULAR | Status: DC
  Administered 2023-03-12: 20000 [IU] via SUBCUTANEOUS

## 2023-03-12 MED ORDER — EPOETIN ALFA-EPBX 10000 UNIT/ML IJ SOLN
INTRAMUSCULAR | Status: AC
  Filled 2023-03-12: qty 2

## 2023-03-13 DIAGNOSIS — E119 Type 2 diabetes mellitus without complications: Secondary | ICD-10-CM | POA: Diagnosis not present

## 2023-03-18 ENCOUNTER — Other Ambulatory Visit: Payer: Self-pay | Admitting: Family Medicine

## 2023-03-18 DIAGNOSIS — E1165 Type 2 diabetes mellitus with hyperglycemia: Secondary | ICD-10-CM

## 2023-03-26 ENCOUNTER — Encounter (HOSPITAL_COMMUNITY)
Admission: RE | Admit: 2023-03-26 | Discharge: 2023-03-26 | Disposition: A | Discharge status: Home / Self Care | Payer: Medicare PPO | Source: Ambulatory Visit | Attending: Nephrology | Admitting: Nephrology

## 2023-03-26 VITALS — BP 147/61 | HR 73 | Temp 97.4°F

## 2023-03-26 DIAGNOSIS — D631 Anemia in chronic kidney disease: Secondary | ICD-10-CM

## 2023-03-26 DIAGNOSIS — N183 Chronic kidney disease, stage 3 unspecified: Secondary | ICD-10-CM | POA: Diagnosis not present

## 2023-03-26 DIAGNOSIS — N170 Acute kidney failure with tubular necrosis: Secondary | ICD-10-CM | POA: Diagnosis not present

## 2023-03-26 DIAGNOSIS — N189 Chronic kidney disease, unspecified: Secondary | ICD-10-CM | POA: Diagnosis not present

## 2023-03-26 LAB — POCT HEMOGLOBIN-HEMACUE: Hemoglobin: 11.5 g/dL — ABNORMAL LOW (ref 12.0–15.0)

## 2023-03-26 MED ORDER — EPOETIN ALFA-EPBX 10000 UNIT/ML IJ SOLN
INTRAMUSCULAR | Status: AC
  Administered 2023-03-26: 20000 [IU] via SUBCUTANEOUS
  Filled 2023-03-26: qty 2

## 2023-03-26 MED ORDER — EPOETIN ALFA-EPBX 10000 UNIT/ML IJ SOLN: 20000.0000 [IU] | INTRAMUSCULAR | Status: DC

## 2023-04-04 ENCOUNTER — Telehealth: Payer: Self-pay | Admitting: Family Medicine

## 2023-04-04 NOTE — Telephone Encounter (Signed)
Called patient to schedule Medicare Annual Wellness Visit (AWV). Left message for patient to call back and schedule Medicare Annual Wellness Visit (AWV).  Last date of AWV:  AWVI eligible as of 11/26/2009  Please schedule an AWVI appointment at any time with [facility] ANNUAL WELLNESS VISIT.  If any questions, please contact me at [phone].    Thank you,  Jacquelynn Friend  Ambulatory Clinic Support [facility] Direct dial  [phone]   

## 2023-04-09 ENCOUNTER — Ambulatory Visit (HOSPITAL_COMMUNITY)
Admission: RE | Admit: 2023-04-09 | Discharge: 2023-04-09 | Disposition: A | Discharge status: Home / Self Care | Payer: Medicare PPO | Source: Ambulatory Visit | Attending: Nephrology | Admitting: Nephrology

## 2023-04-09 VITALS — BP 136/56 | HR 64 | Temp 97.4°F

## 2023-04-09 DIAGNOSIS — N170 Acute kidney failure with tubular necrosis: Secondary | ICD-10-CM | POA: Insufficient documentation

## 2023-04-09 DIAGNOSIS — N189 Chronic kidney disease, unspecified: Secondary | ICD-10-CM | POA: Diagnosis not present

## 2023-04-09 DIAGNOSIS — N183 Chronic kidney disease, stage 3 unspecified: Secondary | ICD-10-CM | POA: Diagnosis not present

## 2023-04-09 DIAGNOSIS — D631 Anemia in chronic kidney disease: Secondary | ICD-10-CM | POA: Insufficient documentation

## 2023-04-09 LAB — DIFFERENTIAL
Abs Immature Granulocytes: 0.02 10*3/uL (ref 0.00–0.07)
Basophils Absolute: 0 10*3/uL (ref 0.0–0.1)
Basophils Relative: 1 %
Eosinophils Absolute: 0.3 10*3/uL (ref 0.0–0.5)
Eosinophils Relative: 4 %
Immature Granulocytes: 0 %
Lymphocytes Relative: 29 %
Lymphs Abs: 2.2 10*3/uL (ref 0.7–4.0)
Monocytes Absolute: 0.5 10*3/uL (ref 0.1–1.0)
Monocytes Relative: 7 %
Neutro Abs: 4.4 10*3/uL (ref 1.7–7.7)
Neutrophils Relative %: 59 %

## 2023-04-09 LAB — RENAL FUNCTION PANEL
Albumin: 3.6 g/dL (ref 3.5–5.0)
Anion gap: 9 (ref 5–15)
BUN: 91 mg/dL — ABNORMAL HIGH (ref 8–23)
CO2: 18 mmol/L — ABNORMAL LOW (ref 22–32)
Calcium: 8.9 mg/dL (ref 8.9–10.3)
Chloride: 109 mmol/L (ref 98–111)
Creatinine, Ser: 3.5 mg/dL — ABNORMAL HIGH (ref 0.44–1.00)
GFR, Estimated: 13 mL/min — ABNORMAL LOW (ref >60)
Glucose, Bld: 137 mg/dL — ABNORMAL HIGH (ref 70–99)
Phosphorus: 4.5 mg/dL (ref 2.5–4.6)
Potassium: 4.6 mmol/L (ref 3.5–5.1)
Sodium: 136 mmol/L (ref 135–145)

## 2023-04-09 LAB — MAGNESIUM: Magnesium: 1.7 mg/dL (ref 1.7–2.4)

## 2023-04-09 LAB — CBC
HCT: 39.1 % (ref 36.0–46.0)
Hemoglobin: 12 g/dL (ref 12.0–15.0)
MCH: 31.2 pg (ref 26.0–34.0)
MCHC: 30.7 g/dL (ref 30.0–36.0)
MCV: 101.6 fL — ABNORMAL HIGH (ref 80.0–100.0)
Platelets: 121 10*3/uL — ABNORMAL LOW (ref 150–400)
RBC: 3.85 MIL/uL — ABNORMAL LOW (ref 3.87–5.11)
RDW: 13.7 % (ref 11.5–15.5)
WBC: 7.4 10*3/uL (ref 4.0–10.5)
nRBC: 0 % (ref 0.0–0.2)

## 2023-04-09 LAB — POCT HEMOGLOBIN-HEMACUE: Hemoglobin: 12.3 g/dL (ref 12.0–15.0)

## 2023-04-09 LAB — IRON AND TIBC
Iron: 83 ug/dL (ref 28–170)
Saturation Ratios: 31 % (ref 10.4–31.8)
TIBC: 265 ug/dL (ref 250–450)
UIBC: 182 ug/dL

## 2023-04-09 LAB — FERRITIN: Ferritin: 146 ng/mL (ref 11–307)

## 2023-04-09 MED ORDER — EPOETIN ALFA-EPBX 10000 UNIT/ML IJ SOLN
INTRAMUSCULAR | Status: AC
  Filled 2023-04-09: qty 2

## 2023-04-09 MED ORDER — EPOETIN ALFA-EPBX 10000 UNIT/ML IJ SOLN: 20000.0000 [IU] | INTRAMUSCULAR | Status: DC

## 2023-04-23 ENCOUNTER — Encounter (HOSPITAL_COMMUNITY)
Admission: RE | Admit: 2023-04-23 | Discharge: 2023-04-23 | Disposition: A | Discharge status: Home / Self Care | Payer: Medicare PPO | Source: Ambulatory Visit | Attending: Nephrology | Admitting: Nephrology

## 2023-04-23 VITALS — BP 140/55 | HR 72 | Temp 97.5°F | Resp 17

## 2023-04-23 DIAGNOSIS — N189 Chronic kidney disease, unspecified: Secondary | ICD-10-CM | POA: Diagnosis not present

## 2023-04-23 DIAGNOSIS — N170 Acute kidney failure with tubular necrosis: Secondary | ICD-10-CM | POA: Diagnosis not present

## 2023-04-23 DIAGNOSIS — N183 Chronic kidney disease, stage 3 unspecified: Secondary | ICD-10-CM | POA: Diagnosis not present

## 2023-04-23 DIAGNOSIS — D631 Anemia in chronic kidney disease: Secondary | ICD-10-CM | POA: Diagnosis not present

## 2023-04-23 LAB — POCT HEMOGLOBIN-HEMACUE: Hemoglobin: 10.4 g/dL — ABNORMAL LOW (ref 12.0–15.0)

## 2023-04-23 MED ORDER — EPOETIN ALFA-EPBX 10000 UNIT/ML IJ SOLN
20000.0000 [IU] | INTRAMUSCULAR | Status: DC
  Administered 2023-04-23: 20000 [IU] via SUBCUTANEOUS

## 2023-04-23 MED ORDER — EPOETIN ALFA-EPBX 10000 UNIT/ML IJ SOLN
INTRAMUSCULAR | Status: AC
  Filled 2023-04-23: qty 2

## 2023-05-07 ENCOUNTER — Encounter (HOSPITAL_COMMUNITY)
Admission: RE | Admit: 2023-05-07 | Discharge: 2023-05-07 | Disposition: A | Discharge status: Home / Self Care | Payer: Medicare PPO | Source: Ambulatory Visit | Attending: Nephrology | Admitting: Nephrology

## 2023-05-07 VITALS — BP 155/58 | HR 69 | Temp 97.6°F | Resp 16

## 2023-05-07 DIAGNOSIS — D631 Anemia in chronic kidney disease: Secondary | ICD-10-CM | POA: Diagnosis not present

## 2023-05-07 DIAGNOSIS — N170 Acute kidney failure with tubular necrosis: Secondary | ICD-10-CM | POA: Insufficient documentation

## 2023-05-07 DIAGNOSIS — N183 Chronic kidney disease, stage 3 unspecified: Secondary | ICD-10-CM | POA: Diagnosis not present

## 2023-05-07 DIAGNOSIS — N189 Chronic kidney disease, unspecified: Secondary | ICD-10-CM | POA: Diagnosis not present

## 2023-05-07 LAB — DIFFERENTIAL
Abs Immature Granulocytes: 0.01 10*3/uL (ref 0.00–0.07)
Basophils Absolute: 0 10*3/uL (ref 0.0–0.1)
Basophils Relative: 1 %
Eosinophils Absolute: 0.4 10*3/uL (ref 0.0–0.5)
Eosinophils Relative: 4 %
Immature Granulocytes: 0 %
Lymphocytes Relative: 24 %
Lymphs Abs: 2 10*3/uL (ref 0.7–4.0)
Monocytes Absolute: 0.6 10*3/uL (ref 0.1–1.0)
Monocytes Relative: 8 %
Neutro Abs: 5.3 10*3/uL (ref 1.7–7.7)
Neutrophils Relative %: 63 %

## 2023-05-07 LAB — CBC
HCT: 32.4 % — ABNORMAL LOW (ref 36.0–46.0)
Hemoglobin: 10.3 g/dL — ABNORMAL LOW (ref 12.0–15.0)
MCH: 31.7 pg (ref 26.0–34.0)
MCHC: 31.8 g/dL (ref 30.0–36.0)
MCV: 99.7 fL (ref 80.0–100.0)
Platelets: 115 10*3/uL — ABNORMAL LOW (ref 150–400)
RBC: 3.25 MIL/uL — ABNORMAL LOW (ref 3.87–5.11)
RDW: 13.4 % (ref 11.5–15.5)
WBC: 8.3 10*3/uL (ref 4.0–10.5)
nRBC: 0 % (ref 0.0–0.2)

## 2023-05-07 LAB — RENAL FUNCTION PANEL
Albumin: 3.6 g/dL (ref 3.5–5.0)
Anion gap: 10 (ref 5–15)
BUN: 72 mg/dL — ABNORMAL HIGH (ref 8–23)
CO2: 19 mmol/L — ABNORMAL LOW (ref 22–32)
Calcium: 8.9 mg/dL (ref 8.9–10.3)
Chloride: 110 mmol/L (ref 98–111)
Creatinine, Ser: 2.94 mg/dL — ABNORMAL HIGH (ref 0.44–1.00)
GFR, Estimated: 16 mL/min — ABNORMAL LOW (ref >60)
Glucose, Bld: 75 mg/dL (ref 70–99)
Phosphorus: 4.9 mg/dL — ABNORMAL HIGH (ref 2.5–4.6)
Potassium: 4.3 mmol/L (ref 3.5–5.1)
Sodium: 139 mmol/L (ref 135–145)

## 2023-05-07 LAB — IRON AND TIBC
Iron: 88 ug/dL (ref 28–170)
Saturation Ratios: 36 % — ABNORMAL HIGH (ref 10.4–31.8)
TIBC: 248 ug/dL — ABNORMAL LOW (ref 250–450)
UIBC: 160 ug/dL

## 2023-05-07 LAB — POCT HEMOGLOBIN-HEMACUE: Hemoglobin: 10.4 g/dL — ABNORMAL LOW (ref 12.0–15.0)

## 2023-05-07 LAB — MAGNESIUM: Magnesium: 1.5 mg/dL — ABNORMAL LOW (ref 1.7–2.4)

## 2023-05-07 LAB — FERRITIN: Ferritin: 267 ng/mL (ref 11–307)

## 2023-05-07 MED ORDER — EPOETIN ALFA-EPBX 10000 UNIT/ML IJ SOLN
INTRAMUSCULAR | Status: AC
  Filled 2023-05-07: qty 2

## 2023-05-07 MED ORDER — EPOETIN ALFA-EPBX 10000 UNIT/ML IJ SOLN
20000.0000 [IU] | INTRAMUSCULAR | Status: DC
  Administered 2023-05-07: 20000 [IU] via SUBCUTANEOUS

## 2023-05-21 ENCOUNTER — Ambulatory Visit (HOSPITAL_COMMUNITY)
Admission: RE | Admit: 2023-05-21 | Discharge: 2023-05-21 | Disposition: A | Discharge status: Home / Self Care | Payer: Medicare PPO | Source: Ambulatory Visit | Attending: Nephrology | Admitting: Nephrology

## 2023-05-21 VITALS — BP 157/59 | HR 65 | Temp 97.9°F | Resp 17

## 2023-05-21 DIAGNOSIS — N183 Chronic kidney disease, stage 3 unspecified: Secondary | ICD-10-CM | POA: Diagnosis not present

## 2023-05-21 DIAGNOSIS — D631 Anemia in chronic kidney disease: Secondary | ICD-10-CM | POA: Insufficient documentation

## 2023-05-21 DIAGNOSIS — N170 Acute kidney failure with tubular necrosis: Secondary | ICD-10-CM | POA: Insufficient documentation

## 2023-05-21 DIAGNOSIS — N189 Chronic kidney disease, unspecified: Secondary | ICD-10-CM | POA: Diagnosis not present

## 2023-05-21 LAB — POCT HEMOGLOBIN-HEMACUE: Hemoglobin: 10.5 g/dL — ABNORMAL LOW (ref 12.0–15.0)

## 2023-05-21 MED ORDER — EPOETIN ALFA-EPBX 10000 UNIT/ML IJ SOLN: 20000.0000 [IU] | INTRAMUSCULAR | Status: DC

## 2023-05-21 MED ORDER — EPOETIN ALFA-EPBX 10000 UNIT/ML IJ SOLN
INTRAMUSCULAR | Status: AC
  Administered 2023-05-21: 20000 [IU] via SUBCUTANEOUS
  Filled 2023-05-21: qty 2

## 2023-06-04 ENCOUNTER — Encounter (HOSPITAL_COMMUNITY)
Admission: RE | Admit: 2023-06-04 | Discharge: 2023-06-04 | Disposition: A | Discharge status: Home / Self Care | Payer: Medicare PPO | Source: Ambulatory Visit | Attending: Nephrology | Admitting: Nephrology

## 2023-06-04 VITALS — BP 155/56 | HR 64 | Temp 97.9°F | Resp 17

## 2023-06-04 DIAGNOSIS — N170 Acute kidney failure with tubular necrosis: Secondary | ICD-10-CM | POA: Insufficient documentation

## 2023-06-04 DIAGNOSIS — R3915 Urgency of urination: Secondary | ICD-10-CM | POA: Diagnosis not present

## 2023-06-04 DIAGNOSIS — M545 Low back pain, unspecified: Secondary | ICD-10-CM | POA: Diagnosis not present

## 2023-06-04 DIAGNOSIS — D631 Anemia in chronic kidney disease: Secondary | ICD-10-CM | POA: Insufficient documentation

## 2023-06-04 DIAGNOSIS — N183 Chronic kidney disease, stage 3 unspecified: Secondary | ICD-10-CM | POA: Insufficient documentation

## 2023-06-04 DIAGNOSIS — R300 Dysuria: Secondary | ICD-10-CM | POA: Diagnosis not present

## 2023-06-04 DIAGNOSIS — N189 Chronic kidney disease, unspecified: Secondary | ICD-10-CM | POA: Diagnosis not present

## 2023-06-04 DIAGNOSIS — R350 Frequency of micturition: Secondary | ICD-10-CM | POA: Diagnosis not present

## 2023-06-04 DIAGNOSIS — Z87440 Personal history of urinary (tract) infections: Secondary | ICD-10-CM | POA: Diagnosis not present

## 2023-06-04 LAB — CBC
HCT: 33.3 % — ABNORMAL LOW (ref 36.0–46.0)
Hemoglobin: 10.5 g/dL — ABNORMAL LOW (ref 12.0–15.0)
MCH: 32.1 pg (ref 26.0–34.0)
MCHC: 31.5 g/dL (ref 30.0–36.0)
MCV: 101.8 fL — ABNORMAL HIGH (ref 80.0–100.0)
Platelets: 119 10*3/uL — ABNORMAL LOW (ref 150–400)
RBC: 3.27 MIL/uL — ABNORMAL LOW (ref 3.87–5.11)
RDW: 13.7 % (ref 11.5–15.5)
WBC: 9.1 10*3/uL (ref 4.0–10.5)
nRBC: 0 % (ref 0.0–0.2)

## 2023-06-04 LAB — RENAL FUNCTION PANEL
Albumin: 3.6 g/dL (ref 3.5–5.0)
Anion gap: 10 (ref 5–15)
BUN: 88 mg/dL — ABNORMAL HIGH (ref 8–23)
CO2: 18 mmol/L — ABNORMAL LOW (ref 22–32)
Calcium: 9.2 mg/dL (ref 8.9–10.3)
Chloride: 108 mmol/L (ref 98–111)
Creatinine, Ser: 3.17 mg/dL — ABNORMAL HIGH (ref 0.44–1.00)
GFR, Estimated: 14 mL/min — ABNORMAL LOW (ref >60)
Glucose, Bld: 129 mg/dL — ABNORMAL HIGH (ref 70–99)
Phosphorus: 4.5 mg/dL (ref 2.5–4.6)
Potassium: 4.5 mmol/L (ref 3.5–5.1)
Sodium: 136 mmol/L (ref 135–145)

## 2023-06-04 LAB — IRON AND TIBC
Iron: 89 ug/dL (ref 28–170)
Saturation Ratios: 35 % — ABNORMAL HIGH (ref 10.4–31.8)
TIBC: 258 ug/dL (ref 250–450)
UIBC: 169 ug/dL

## 2023-06-04 LAB — DIFFERENTIAL
Abs Immature Granulocytes: 0.03 10*3/uL (ref 0.00–0.07)
Basophils Absolute: 0.1 10*3/uL (ref 0.0–0.1)
Basophils Relative: 1 %
Eosinophils Absolute: 0.3 10*3/uL (ref 0.0–0.5)
Eosinophils Relative: 3 %
Immature Granulocytes: 0 %
Lymphocytes Relative: 26 %
Lymphs Abs: 2.4 10*3/uL (ref 0.7–4.0)
Monocytes Absolute: 0.7 10*3/uL (ref 0.1–1.0)
Monocytes Relative: 8 %
Neutro Abs: 5.7 10*3/uL (ref 1.7–7.7)
Neutrophils Relative %: 62 %

## 2023-06-04 LAB — MAGNESIUM: Magnesium: 1.8 mg/dL (ref 1.7–2.4)

## 2023-06-04 LAB — FERRITIN: Ferritin: 276 ng/mL (ref 11–307)

## 2023-06-04 LAB — POCT HEMOGLOBIN-HEMACUE: Hemoglobin: 10.9 g/dL — ABNORMAL LOW (ref 12.0–15.0)

## 2023-06-04 MED ORDER — EPOETIN ALFA-EPBX 10000 UNIT/ML IJ SOLN: 20000.0000 [IU] | INTRAMUSCULAR | Status: DC

## 2023-06-04 MED ORDER — EPOETIN ALFA-EPBX 10000 UNIT/ML IJ SOLN
INTRAMUSCULAR | Status: AC
  Administered 2023-06-04: 20000 [IU] via SUBCUTANEOUS
  Filled 2023-06-04: qty 2

## 2023-06-18 ENCOUNTER — Encounter (HOSPITAL_COMMUNITY)
Admission: RE | Admit: 2023-06-18 | Discharge: 2023-06-18 | Disposition: A | Discharge status: Home / Self Care | Payer: Medicare PPO | Source: Ambulatory Visit | Attending: Nephrology | Admitting: Nephrology

## 2023-06-18 VITALS — BP 147/53 | HR 74 | Temp 97.4°F | Resp 17

## 2023-06-18 DIAGNOSIS — N189 Chronic kidney disease, unspecified: Secondary | ICD-10-CM

## 2023-06-18 DIAGNOSIS — N170 Acute kidney failure with tubular necrosis: Secondary | ICD-10-CM

## 2023-06-18 DIAGNOSIS — N183 Chronic kidney disease, stage 3 unspecified: Secondary | ICD-10-CM | POA: Diagnosis not present

## 2023-06-18 DIAGNOSIS — D631 Anemia in chronic kidney disease: Secondary | ICD-10-CM | POA: Diagnosis not present

## 2023-06-18 LAB — POCT HEMOGLOBIN-HEMACUE: Hemoglobin: 10.9 g/dL — ABNORMAL LOW (ref 12.0–15.0)

## 2023-06-18 MED ORDER — EPOETIN ALFA-EPBX 10000 UNIT/ML IJ SOLN
20000.0000 [IU] | INTRAMUSCULAR | Status: DC
  Administered 2023-06-18: 20000 [IU] via SUBCUTANEOUS

## 2023-06-18 MED ORDER — EPOETIN ALFA-EPBX 10000 UNIT/ML IJ SOLN
INTRAMUSCULAR | Status: AC
  Filled 2023-06-18: qty 2

## 2023-07-02 ENCOUNTER — Ambulatory Visit (HOSPITAL_COMMUNITY)
Admission: RE | Admit: 2023-07-02 | Discharge: 2023-07-02 | Disposition: A | Discharge status: Home / Self Care | Payer: Medicare PPO | Source: Ambulatory Visit | Attending: Nephrology | Admitting: Nephrology

## 2023-07-02 VITALS — BP 131/50 | HR 64 | Temp 97.3°F | Resp 17

## 2023-07-02 DIAGNOSIS — N170 Acute kidney failure with tubular necrosis: Secondary | ICD-10-CM | POA: Diagnosis not present

## 2023-07-02 DIAGNOSIS — D631 Anemia in chronic kidney disease: Secondary | ICD-10-CM | POA: Diagnosis not present

## 2023-07-02 DIAGNOSIS — N189 Chronic kidney disease, unspecified: Secondary | ICD-10-CM | POA: Diagnosis not present

## 2023-07-02 DIAGNOSIS — N183 Chronic kidney disease, stage 3 unspecified: Secondary | ICD-10-CM | POA: Diagnosis not present

## 2023-07-02 LAB — CBC
HCT: 33.8 % — ABNORMAL LOW (ref 36.0–46.0)
Hemoglobin: 10.6 g/dL — ABNORMAL LOW (ref 12.0–15.0)
MCH: 31.5 pg (ref 26.0–34.0)
MCHC: 31.4 g/dL (ref 30.0–36.0)
MCV: 100.6 fL — ABNORMAL HIGH (ref 80.0–100.0)
Platelets: 126 10*3/uL — ABNORMAL LOW (ref 150–400)
RBC: 3.36 MIL/uL — ABNORMAL LOW (ref 3.87–5.11)
RDW: 13.6 % (ref 11.5–15.5)
WBC: 7.8 10*3/uL (ref 4.0–10.5)
nRBC: 0 % (ref 0.0–0.2)

## 2023-07-02 LAB — DIFFERENTIAL
Abs Immature Granulocytes: 0.03 10*3/uL (ref 0.00–0.07)
Basophils Absolute: 0 10*3/uL (ref 0.0–0.1)
Basophils Relative: 1 %
Eosinophils Absolute: 0.3 10*3/uL (ref 0.0–0.5)
Eosinophils Relative: 4 %
Immature Granulocytes: 0 %
Lymphocytes Relative: 25 %
Lymphs Abs: 2 10*3/uL (ref 0.7–4.0)
Monocytes Absolute: 0.7 10*3/uL (ref 0.1–1.0)
Monocytes Relative: 9 %
Neutro Abs: 4.8 10*3/uL (ref 1.7–7.7)
Neutrophils Relative %: 61 %

## 2023-07-02 LAB — RENAL FUNCTION PANEL
Albumin: 3.6 g/dL (ref 3.5–5.0)
Anion gap: 10 (ref 5–15)
BUN: 89 mg/dL — ABNORMAL HIGH (ref 8–23)
CO2: 17 mmol/L — ABNORMAL LOW (ref 22–32)
Calcium: 8.9 mg/dL (ref 8.9–10.3)
Chloride: 110 mmol/L (ref 98–111)
Creatinine, Ser: 3.38 mg/dL — ABNORMAL HIGH (ref 0.44–1.00)
GFR, Estimated: 13 mL/min — ABNORMAL LOW (ref >60)
Glucose, Bld: 134 mg/dL — ABNORMAL HIGH (ref 70–99)
Phosphorus: 5.2 mg/dL — ABNORMAL HIGH (ref 2.5–4.6)
Potassium: 4.5 mmol/L (ref 3.5–5.1)
Sodium: 137 mmol/L (ref 135–145)

## 2023-07-02 LAB — IRON AND TIBC
Iron: 117 ug/dL (ref 28–170)
Saturation Ratios: 42 % — ABNORMAL HIGH (ref 10.4–31.8)
TIBC: 279 ug/dL (ref 250–450)
UIBC: 162 ug/dL

## 2023-07-02 LAB — FERRITIN: Ferritin: 204 ng/mL (ref 11–307)

## 2023-07-02 LAB — POCT HEMOGLOBIN-HEMACUE: Hemoglobin: 10.8 g/dL — ABNORMAL LOW (ref 12.0–15.0)

## 2023-07-02 LAB — MAGNESIUM: Magnesium: 1.7 mg/dL (ref 1.7–2.4)

## 2023-07-02 MED ORDER — EPOETIN ALFA-EPBX 10000 UNIT/ML IJ SOLN: 20000.0000 [IU] | INTRAMUSCULAR | Status: DC

## 2023-07-02 MED ORDER — EPOETIN ALFA-EPBX 10000 UNIT/ML IJ SOLN
INTRAMUSCULAR | Status: AC
  Administered 2023-07-02: 20000 [IU] via SUBCUTANEOUS
  Filled 2023-07-02: qty 2

## 2023-07-16 ENCOUNTER — Encounter (HOSPITAL_COMMUNITY)
Admission: RE | Admit: 2023-07-16 | Discharge: 2023-07-16 | Disposition: A | Discharge status: Home / Self Care | Payer: Medicare PPO | Source: Ambulatory Visit | Attending: Nephrology | Admitting: Nephrology

## 2023-07-16 VITALS — BP 159/54 | HR 67 | Temp 97.9°F | Resp 17

## 2023-07-16 DIAGNOSIS — N189 Chronic kidney disease, unspecified: Secondary | ICD-10-CM | POA: Insufficient documentation

## 2023-07-16 DIAGNOSIS — N170 Acute kidney failure with tubular necrosis: Secondary | ICD-10-CM | POA: Diagnosis not present

## 2023-07-16 DIAGNOSIS — N183 Chronic kidney disease, stage 3 unspecified: Secondary | ICD-10-CM | POA: Insufficient documentation

## 2023-07-16 DIAGNOSIS — D631 Anemia in chronic kidney disease: Secondary | ICD-10-CM | POA: Insufficient documentation

## 2023-07-16 LAB — POCT HEMOGLOBIN-HEMACUE: Hemoglobin: 11.1 g/dL — ABNORMAL LOW (ref 12.0–15.0)

## 2023-07-16 MED ORDER — EPOETIN ALFA-EPBX 10000 UNIT/ML IJ SOLN
INTRAMUSCULAR | Status: AC
  Filled 2023-07-16: qty 2

## 2023-07-16 MED ORDER — EPOETIN ALFA-EPBX 10000 UNIT/ML IJ SOLN
20000.0000 [IU] | INTRAMUSCULAR | Status: DC
  Administered 2023-07-16: 20000 [IU] via SUBCUTANEOUS

## 2023-07-31 ENCOUNTER — Ambulatory Visit (HOSPITAL_COMMUNITY)
Admission: RE | Admit: 2023-07-31 | Discharge: 2023-07-31 | Disposition: A | Discharge status: Home / Self Care | Payer: Medicare PPO | Source: Ambulatory Visit | Attending: Nephrology | Admitting: Nephrology

## 2023-07-31 VITALS — BP 150/61 | HR 67 | Temp 97.1°F | Resp 17

## 2023-07-31 DIAGNOSIS — N170 Acute kidney failure with tubular necrosis: Secondary | ICD-10-CM | POA: Diagnosis not present

## 2023-07-31 DIAGNOSIS — D631 Anemia in chronic kidney disease: Secondary | ICD-10-CM | POA: Insufficient documentation

## 2023-07-31 DIAGNOSIS — N189 Chronic kidney disease, unspecified: Secondary | ICD-10-CM | POA: Diagnosis not present

## 2023-07-31 DIAGNOSIS — N183 Chronic kidney disease, stage 3 unspecified: Secondary | ICD-10-CM | POA: Insufficient documentation

## 2023-07-31 LAB — CBC
HCT: 36.3 % (ref 36.0–46.0)
Hemoglobin: 11.3 g/dL — ABNORMAL LOW (ref 12.0–15.0)
MCH: 32.1 pg (ref 26.0–34.0)
MCHC: 31.1 g/dL (ref 30.0–36.0)
MCV: 103.1 fL — ABNORMAL HIGH (ref 80.0–100.0)
Platelets: 131 10*3/uL — ABNORMAL LOW (ref 150–400)
RBC: 3.52 MIL/uL — ABNORMAL LOW (ref 3.87–5.11)
RDW: 13.2 % (ref 11.5–15.5)
WBC: 8.4 10*3/uL (ref 4.0–10.5)
nRBC: 0 % (ref 0.0–0.2)

## 2023-07-31 LAB — DIFFERENTIAL
Abs Immature Granulocytes: 0.02 10*3/uL (ref 0.00–0.07)
Basophils Absolute: 0 10*3/uL (ref 0.0–0.1)
Basophils Relative: 1 %
Eosinophils Absolute: 0.3 10*3/uL (ref 0.0–0.5)
Eosinophils Relative: 3 %
Immature Granulocytes: 0 %
Lymphocytes Relative: 26 %
Lymphs Abs: 2.2 10*3/uL (ref 0.7–4.0)
Monocytes Absolute: 0.6 10*3/uL (ref 0.1–1.0)
Monocytes Relative: 8 %
Neutro Abs: 5.2 10*3/uL (ref 1.7–7.7)
Neutrophils Relative %: 62 %

## 2023-07-31 LAB — FERRITIN: Ferritin: 199 ng/mL (ref 11–307)

## 2023-07-31 LAB — POCT HEMOGLOBIN-HEMACUE: Hemoglobin: 11.5 g/dL — ABNORMAL LOW (ref 12.0–15.0)

## 2023-07-31 LAB — RENAL FUNCTION PANEL
Albumin: 3.5 g/dL (ref 3.5–5.0)
Anion gap: 11 (ref 5–15)
BUN: 93 mg/dL — ABNORMAL HIGH (ref 8–23)
CO2: 17 mmol/L — ABNORMAL LOW (ref 22–32)
Calcium: 8.9 mg/dL (ref 8.9–10.3)
Chloride: 109 mmol/L (ref 98–111)
Creatinine, Ser: 3.57 mg/dL — ABNORMAL HIGH (ref 0.44–1.00)
GFR, Estimated: 12 mL/min — ABNORMAL LOW (ref >60)
Glucose, Bld: 157 mg/dL — ABNORMAL HIGH (ref 70–99)
Phosphorus: 5.4 mg/dL — ABNORMAL HIGH (ref 2.5–4.6)
Potassium: 4.9 mmol/L (ref 3.5–5.1)
Sodium: 137 mmol/L (ref 135–145)

## 2023-07-31 LAB — IRON AND TIBC
Iron: 95 ug/dL (ref 28–170)
Saturation Ratios: 33 % — ABNORMAL HIGH (ref 10.4–31.8)
TIBC: 291 ug/dL (ref 250–450)
UIBC: 196 ug/dL

## 2023-07-31 LAB — MAGNESIUM: Magnesium: 1.9 mg/dL (ref 1.7–2.4)

## 2023-07-31 MED ORDER — EPOETIN ALFA-EPBX 10000 UNIT/ML IJ SOLN
INTRAMUSCULAR | Status: AC
  Administered 2023-07-31: 20000 [IU] via SUBCUTANEOUS
  Filled 2023-07-31: qty 2

## 2023-07-31 MED ORDER — EPOETIN ALFA-EPBX 10000 UNIT/ML IJ SOLN: 20000.0000 [IU] | INTRAMUSCULAR | Status: DC

## 2023-08-08 ENCOUNTER — Other Ambulatory Visit: Payer: Self-pay

## 2023-08-08 MED ORDER — ACCU-CHEK GUIDE VI STRP: ORAL_STRIP | 3 refills | Status: DC

## 2023-08-13 ENCOUNTER — Other Ambulatory Visit: Payer: Self-pay

## 2023-08-13 DIAGNOSIS — E7800 Pure hypercholesterolemia, unspecified: Secondary | ICD-10-CM

## 2023-08-13 MED ORDER — ATORVASTATIN CALCIUM 40 MG PO TABS
ORAL_TABLET | ORAL | 3 refills | Status: DC
Start: 2023-08-13 — End: 2024-06-15

## 2023-08-14 ENCOUNTER — Encounter (HOSPITAL_COMMUNITY)
Admission: RE | Admit: 2023-08-14 | Discharge: 2023-08-14 | Disposition: A | Discharge status: Home / Self Care | Payer: Medicare PPO | Source: Ambulatory Visit | Attending: Nephrology | Admitting: Nephrology

## 2023-08-14 VITALS — BP 153/58 | HR 72 | Temp 97.4°F | Resp 17

## 2023-08-14 DIAGNOSIS — N170 Acute kidney failure with tubular necrosis: Secondary | ICD-10-CM | POA: Insufficient documentation

## 2023-08-14 DIAGNOSIS — D631 Anemia in chronic kidney disease: Secondary | ICD-10-CM | POA: Insufficient documentation

## 2023-08-14 DIAGNOSIS — N189 Chronic kidney disease, unspecified: Secondary | ICD-10-CM | POA: Diagnosis not present

## 2023-08-14 DIAGNOSIS — N183 Chronic kidney disease, stage 3 unspecified: Secondary | ICD-10-CM | POA: Diagnosis not present

## 2023-08-14 LAB — POCT HEMOGLOBIN-HEMACUE: Hemoglobin: 11.5 g/dL — ABNORMAL LOW (ref 12.0–15.0)

## 2023-08-14 MED ORDER — EPOETIN ALFA-EPBX 10000 UNIT/ML IJ SOLN: 20000.0000 [IU] | INTRAMUSCULAR | Status: DC

## 2023-08-14 MED ORDER — EPOETIN ALFA-EPBX 10000 UNIT/ML IJ SOLN
INTRAMUSCULAR | Status: AC
  Administered 2023-08-14: 20000 [IU] via SUBCUTANEOUS
  Filled 2023-08-14: qty 2

## 2023-08-28 ENCOUNTER — Ambulatory Visit (HOSPITAL_COMMUNITY)
Admission: RE | Admit: 2023-08-28 | Discharge: 2023-08-28 | Disposition: A | Discharge status: Home / Self Care | Payer: Medicare PPO | Source: Ambulatory Visit | Attending: Nephrology | Admitting: Nephrology

## 2023-08-28 VITALS — BP 145/44 | HR 70 | Temp 97.0°F | Resp 16

## 2023-08-28 DIAGNOSIS — N189 Chronic kidney disease, unspecified: Secondary | ICD-10-CM | POA: Diagnosis not present

## 2023-08-28 DIAGNOSIS — N183 Chronic kidney disease, stage 3 unspecified: Secondary | ICD-10-CM | POA: Diagnosis not present

## 2023-08-28 DIAGNOSIS — D631 Anemia in chronic kidney disease: Secondary | ICD-10-CM | POA: Diagnosis not present

## 2023-08-28 DIAGNOSIS — N170 Acute kidney failure with tubular necrosis: Secondary | ICD-10-CM | POA: Insufficient documentation

## 2023-08-28 LAB — RENAL FUNCTION PANEL
Albumin: 3.5 g/dL (ref 3.5–5.0)
Anion gap: 11 (ref 5–15)
BUN: 87 mg/dL — ABNORMAL HIGH (ref 8–23)
CO2: 17 mmol/L — ABNORMAL LOW (ref 22–32)
Calcium: 9 mg/dL (ref 8.9–10.3)
Chloride: 109 mmol/L (ref 98–111)
Creatinine, Ser: 3.18 mg/dL — ABNORMAL HIGH (ref 0.44–1.00)
GFR, Estimated: 14 mL/min — ABNORMAL LOW (ref >60)
Glucose, Bld: 187 mg/dL — ABNORMAL HIGH (ref 70–99)
Phosphorus: 5.3 mg/dL — ABNORMAL HIGH (ref 2.5–4.6)
Potassium: 4.4 mmol/L (ref 3.5–5.1)
Sodium: 137 mmol/L (ref 135–145)

## 2023-08-28 LAB — IRON AND TIBC
Iron: 116 ug/dL (ref 28–170)
Saturation Ratios: 43 % — ABNORMAL HIGH (ref 10.4–31.8)
TIBC: 272 ug/dL (ref 250–450)
UIBC: 156 ug/dL

## 2023-08-28 LAB — CBC
HCT: 36.9 % (ref 36.0–46.0)
Hemoglobin: 11.4 g/dL — ABNORMAL LOW (ref 12.0–15.0)
MCH: 31.1 pg (ref 26.0–34.0)
MCHC: 30.9 g/dL (ref 30.0–36.0)
MCV: 100.8 fL — ABNORMAL HIGH (ref 80.0–100.0)
Platelets: 128 10*3/uL — ABNORMAL LOW (ref 150–400)
RBC: 3.66 MIL/uL — ABNORMAL LOW (ref 3.87–5.11)
RDW: 13.2 % (ref 11.5–15.5)
WBC: 8.1 10*3/uL (ref 4.0–10.5)
nRBC: 0 % (ref 0.0–0.2)

## 2023-08-28 LAB — DIFFERENTIAL
Abs Immature Granulocytes: 0.03 10*3/uL (ref 0.00–0.07)
Basophils Absolute: 0.1 10*3/uL (ref 0.0–0.1)
Basophils Relative: 1 %
Eosinophils Absolute: 0.3 10*3/uL (ref 0.0–0.5)
Eosinophils Relative: 3 %
Immature Granulocytes: 0 %
Lymphocytes Relative: 20 %
Lymphs Abs: 1.6 10*3/uL (ref 0.7–4.0)
Monocytes Absolute: 0.6 10*3/uL (ref 0.1–1.0)
Monocytes Relative: 7 %
Neutro Abs: 5.6 10*3/uL (ref 1.7–7.7)
Neutrophils Relative %: 69 %

## 2023-08-28 LAB — POCT HEMOGLOBIN-HEMACUE: Hemoglobin: 11.8 g/dL — ABNORMAL LOW (ref 12.0–15.0)

## 2023-08-28 LAB — FERRITIN: Ferritin: 206 ng/mL (ref 11–307)

## 2023-08-28 LAB — MAGNESIUM: Magnesium: 1.8 mg/dL (ref 1.7–2.4)

## 2023-08-28 MED ORDER — EPOETIN ALFA-EPBX 10000 UNIT/ML IJ SOLN
INTRAMUSCULAR | Status: AC
  Administered 2023-08-28: 20000 [IU] via SUBCUTANEOUS
  Filled 2023-08-28: qty 2

## 2023-08-28 MED ORDER — EPOETIN ALFA-EPBX 10000 UNIT/ML IJ SOLN: 20000.0000 [IU] | INTRAMUSCULAR | Status: DC

## 2023-09-10 ENCOUNTER — Encounter (HOSPITAL_COMMUNITY)
Admission: RE | Admit: 2023-09-10 | Discharge: 2023-09-10 | Disposition: A | Discharge status: Home / Self Care | Payer: Medicare PPO | Source: Ambulatory Visit | Attending: Nephrology | Admitting: Nephrology

## 2023-09-10 VITALS — BP 142/55 | HR 67 | Temp 97.1°F | Resp 16

## 2023-09-10 DIAGNOSIS — N189 Chronic kidney disease, unspecified: Secondary | ICD-10-CM | POA: Insufficient documentation

## 2023-09-10 DIAGNOSIS — N170 Acute kidney failure with tubular necrosis: Secondary | ICD-10-CM | POA: Diagnosis not present

## 2023-09-10 DIAGNOSIS — N183 Chronic kidney disease, stage 3 unspecified: Secondary | ICD-10-CM | POA: Diagnosis not present

## 2023-09-10 DIAGNOSIS — D631 Anemia in chronic kidney disease: Secondary | ICD-10-CM | POA: Insufficient documentation

## 2023-09-10 LAB — POCT HEMOGLOBIN-HEMACUE: Hemoglobin: 11.7 g/dL — ABNORMAL LOW (ref 12.0–15.0)

## 2023-09-10 MED ORDER — EPOETIN ALFA-EPBX 10000 UNIT/ML IJ SOLN
INTRAMUSCULAR | Status: AC
  Filled 2023-09-10: qty 2

## 2023-09-10 MED ORDER — EPOETIN ALFA-EPBX 10000 UNIT/ML IJ SOLN
20000.0000 [IU] | INTRAMUSCULAR | Status: DC
  Administered 2023-09-10: 20000 [IU] via SUBCUTANEOUS

## 2023-09-11 ENCOUNTER — Encounter (HOSPITAL_COMMUNITY): Payer: Medicare PPO

## 2023-09-24 ENCOUNTER — Encounter (HOSPITAL_COMMUNITY)
Admission: RE | Admit: 2023-09-24 | Discharge: 2023-09-24 | Disposition: A | Discharge status: Home / Self Care | Payer: Medicare PPO | Source: Ambulatory Visit | Attending: Nephrology | Admitting: Nephrology

## 2023-09-24 VITALS — BP 164/56 | HR 64 | Temp 97.7°F | Resp 16

## 2023-09-24 DIAGNOSIS — N183 Chronic kidney disease, stage 3 unspecified: Secondary | ICD-10-CM

## 2023-09-24 DIAGNOSIS — D631 Anemia in chronic kidney disease: Secondary | ICD-10-CM | POA: Diagnosis not present

## 2023-09-24 DIAGNOSIS — N170 Acute kidney failure with tubular necrosis: Secondary | ICD-10-CM | POA: Diagnosis not present

## 2023-09-24 DIAGNOSIS — N189 Chronic kidney disease, unspecified: Secondary | ICD-10-CM | POA: Diagnosis not present

## 2023-09-24 LAB — POCT HEMOGLOBIN-HEMACUE: Hemoglobin: 11.5 g/dL — ABNORMAL LOW (ref 12.0–15.0)

## 2023-09-24 MED ORDER — EPOETIN ALFA-EPBX 10000 UNIT/ML IJ SOLN
INTRAMUSCULAR | Status: AC
  Filled 2023-09-24: qty 2

## 2023-09-24 MED ORDER — EPOETIN ALFA-EPBX 10000 UNIT/ML IJ SOLN
20000.0000 [IU] | INTRAMUSCULAR | Status: DC
  Administered 2023-09-24: 20000 [IU] via SUBCUTANEOUS

## 2023-10-08 ENCOUNTER — Ambulatory Visit (HOSPITAL_COMMUNITY)
Admission: RE | Admit: 2023-10-08 | Discharge: 2023-10-08 | Disposition: A | Discharge status: Home / Self Care | Payer: Medicare PPO | Source: Ambulatory Visit | Attending: Nephrology | Admitting: Nephrology

## 2023-10-08 VITALS — BP 146/47 | HR 66 | Temp 97.4°F | Resp 16

## 2023-10-08 DIAGNOSIS — N189 Chronic kidney disease, unspecified: Secondary | ICD-10-CM | POA: Diagnosis not present

## 2023-10-08 DIAGNOSIS — N183 Chronic kidney disease, stage 3 unspecified: Secondary | ICD-10-CM | POA: Diagnosis not present

## 2023-10-08 DIAGNOSIS — N170 Acute kidney failure with tubular necrosis: Secondary | ICD-10-CM | POA: Insufficient documentation

## 2023-10-08 DIAGNOSIS — D631 Anemia in chronic kidney disease: Secondary | ICD-10-CM | POA: Diagnosis not present

## 2023-10-08 LAB — CBC
HCT: 36.4 % (ref 36.0–46.0)
Hemoglobin: 11 g/dL — ABNORMAL LOW (ref 12.0–15.0)
MCH: 30.4 pg (ref 26.0–34.0)
MCHC: 30.2 g/dL (ref 30.0–36.0)
MCV: 100.6 fL — ABNORMAL HIGH (ref 80.0–100.0)
Platelets: 121 10*3/uL — ABNORMAL LOW (ref 150–400)
RBC: 3.62 MIL/uL — ABNORMAL LOW (ref 3.87–5.11)
RDW: 13.4 % (ref 11.5–15.5)
WBC: 9.1 10*3/uL (ref 4.0–10.5)
nRBC: 0 % (ref 0.0–0.2)

## 2023-10-08 LAB — IRON AND TIBC
Iron: 119 ug/dL (ref 28–170)
Saturation Ratios: 42 % — ABNORMAL HIGH (ref 10.4–31.8)
TIBC: 281 ug/dL (ref 250–450)
UIBC: 162 ug/dL

## 2023-10-08 LAB — DIFFERENTIAL
Abs Immature Granulocytes: 0.03 10*3/uL (ref 0.00–0.07)
Basophils Absolute: 0.1 10*3/uL (ref 0.0–0.1)
Basophils Relative: 1 %
Eosinophils Absolute: 0.2 10*3/uL (ref 0.0–0.5)
Eosinophils Relative: 3 %
Immature Granulocytes: 0 %
Lymphocytes Relative: 17 %
Lymphs Abs: 1.5 10*3/uL (ref 0.7–4.0)
Monocytes Absolute: 0.6 10*3/uL (ref 0.1–1.0)
Monocytes Relative: 6 %
Neutro Abs: 6.8 10*3/uL (ref 1.7–7.7)
Neutrophils Relative %: 73 %

## 2023-10-08 LAB — RENAL FUNCTION PANEL
Albumin: 3.5 g/dL (ref 3.5–5.0)
Anion gap: 12 (ref 5–15)
BUN: 105 mg/dL — ABNORMAL HIGH (ref 8–23)
CO2: 16 mmol/L — ABNORMAL LOW (ref 22–32)
Calcium: 9.2 mg/dL (ref 8.9–10.3)
Chloride: 110 mmol/L (ref 98–111)
Creatinine, Ser: 3.61 mg/dL — ABNORMAL HIGH (ref 0.44–1.00)
GFR, Estimated: 12 mL/min — ABNORMAL LOW (ref >60)
Glucose, Bld: 169 mg/dL — ABNORMAL HIGH (ref 70–99)
Phosphorus: 4.6 mg/dL (ref 2.5–4.6)
Potassium: 4.5 mmol/L (ref 3.5–5.1)
Sodium: 138 mmol/L (ref 135–145)

## 2023-10-08 LAB — MAGNESIUM: Magnesium: 1.8 mg/dL (ref 1.7–2.4)

## 2023-10-08 LAB — POCT HEMOGLOBIN-HEMACUE: Hemoglobin: 11.6 g/dL — ABNORMAL LOW (ref 12.0–15.0)

## 2023-10-08 LAB — FERRITIN: Ferritin: 201 ng/mL (ref 11–307)

## 2023-10-08 MED ORDER — EPOETIN ALFA-EPBX 10000 UNIT/ML IJ SOLN
INTRAMUSCULAR | Status: AC
  Filled 2023-10-08: qty 2

## 2023-10-08 MED ORDER — EPOETIN ALFA-EPBX 10000 UNIT/ML IJ SOLN
20000.0000 [IU] | INTRAMUSCULAR | Status: DC
  Administered 2023-10-08: 20000 [IU] via SUBCUTANEOUS

## 2023-10-22 ENCOUNTER — Encounter (HOSPITAL_COMMUNITY)
Admission: RE | Admit: 2023-10-22 | Discharge: 2023-10-22 | Disposition: A | Discharge status: Home / Self Care | Payer: Medicare PPO | Source: Ambulatory Visit | Attending: Nephrology | Admitting: Nephrology

## 2023-10-22 VITALS — BP 150/61 | HR 67 | Temp 97.4°F | Resp 16

## 2023-10-22 DIAGNOSIS — N183 Chronic kidney disease, stage 3 unspecified: Secondary | ICD-10-CM | POA: Diagnosis not present

## 2023-10-22 DIAGNOSIS — D631 Anemia in chronic kidney disease: Secondary | ICD-10-CM | POA: Diagnosis not present

## 2023-10-22 DIAGNOSIS — N189 Chronic kidney disease, unspecified: Secondary | ICD-10-CM | POA: Insufficient documentation

## 2023-10-22 DIAGNOSIS — N170 Acute kidney failure with tubular necrosis: Secondary | ICD-10-CM | POA: Diagnosis not present

## 2023-10-22 LAB — POCT HEMOGLOBIN-HEMACUE: Hemoglobin: 11.6 g/dL — ABNORMAL LOW (ref 12.0–15.0)

## 2023-10-22 MED ORDER — EPOETIN ALFA-EPBX 10000 UNIT/ML IJ SOLN
INTRAMUSCULAR | Status: AC
  Filled 2023-10-22: qty 2

## 2023-10-22 MED ORDER — EPOETIN ALFA-EPBX 10000 UNIT/ML IJ SOLN
20000.0000 [IU] | INTRAMUSCULAR | Status: DC
  Administered 2023-10-22: 20000 [IU] via SUBCUTANEOUS

## 2023-11-05 ENCOUNTER — Ambulatory Visit (HOSPITAL_COMMUNITY)
Admission: RE | Admit: 2023-11-05 | Discharge: 2023-11-05 | Disposition: A | Discharge status: Home / Self Care | Payer: Medicare PPO | Source: Ambulatory Visit | Attending: Nephrology | Admitting: Nephrology

## 2023-11-05 VITALS — BP 150/50 | HR 76 | Temp 97.1°F | Resp 16

## 2023-11-05 DIAGNOSIS — D631 Anemia in chronic kidney disease: Secondary | ICD-10-CM | POA: Diagnosis not present

## 2023-11-05 DIAGNOSIS — N189 Chronic kidney disease, unspecified: Secondary | ICD-10-CM | POA: Insufficient documentation

## 2023-11-05 DIAGNOSIS — N170 Acute kidney failure with tubular necrosis: Secondary | ICD-10-CM | POA: Insufficient documentation

## 2023-11-05 DIAGNOSIS — N183 Chronic kidney disease, stage 3 unspecified: Secondary | ICD-10-CM | POA: Insufficient documentation

## 2023-11-05 LAB — DIFFERENTIAL
Abs Immature Granulocytes: 0.01 10*3/uL (ref 0.00–0.07)
Basophils Absolute: 0 10*3/uL (ref 0.0–0.1)
Basophils Relative: 0 %
Eosinophils Absolute: 0.3 10*3/uL (ref 0.0–0.5)
Eosinophils Relative: 3 %
Immature Granulocytes: 0 %
Lymphocytes Relative: 23 %
Lymphs Abs: 2 10*3/uL (ref 0.7–4.0)
Monocytes Absolute: 0.6 10*3/uL (ref 0.1–1.0)
Monocytes Relative: 7 %
Neutro Abs: 5.9 10*3/uL (ref 1.7–7.7)
Neutrophils Relative %: 67 %

## 2023-11-05 LAB — POCT HEMOGLOBIN-HEMACUE: Hemoglobin: 11.4 g/dL — ABNORMAL LOW (ref 12.0–15.0)

## 2023-11-05 LAB — CBC
HCT: 35.9 % — ABNORMAL LOW (ref 36.0–46.0)
Hemoglobin: 11.4 g/dL — ABNORMAL LOW (ref 12.0–15.0)
MCH: 31.9 pg (ref 26.0–34.0)
MCHC: 31.8 g/dL (ref 30.0–36.0)
MCV: 100.6 fL — ABNORMAL HIGH (ref 80.0–100.0)
Platelets: 131 10*3/uL — ABNORMAL LOW (ref 150–400)
RBC: 3.57 MIL/uL — ABNORMAL LOW (ref 3.87–5.11)
RDW: 13.2 % (ref 11.5–15.5)
WBC: 8.7 10*3/uL (ref 4.0–10.5)
nRBC: 0 % (ref 0.0–0.2)

## 2023-11-05 LAB — RENAL FUNCTION PANEL
Albumin: 3.6 g/dL (ref 3.5–5.0)
Anion gap: 10 (ref 5–15)
BUN: 83 mg/dL — ABNORMAL HIGH (ref 8–23)
CO2: 19 mmol/L — ABNORMAL LOW (ref 22–32)
Calcium: 9.3 mg/dL (ref 8.9–10.3)
Chloride: 110 mmol/L (ref 98–111)
Creatinine, Ser: 3.26 mg/dL — ABNORMAL HIGH (ref 0.44–1.00)
GFR, Estimated: 14 mL/min — ABNORMAL LOW (ref >60)
Glucose, Bld: 131 mg/dL — ABNORMAL HIGH (ref 70–99)
Phosphorus: 5.3 mg/dL — ABNORMAL HIGH (ref 2.5–4.6)
Potassium: 4.4 mmol/L (ref 3.5–5.1)
Sodium: 139 mmol/L (ref 135–145)

## 2023-11-05 LAB — IRON AND TIBC
Iron: 79 ug/dL (ref 28–170)
Saturation Ratios: 29 % (ref 10.4–31.8)
TIBC: 273 ug/dL (ref 250–450)
UIBC: 194 ug/dL

## 2023-11-05 LAB — MAGNESIUM: Magnesium: 1.8 mg/dL (ref 1.7–2.4)

## 2023-11-05 LAB — FERRITIN: Ferritin: 202 ng/mL (ref 11–307)

## 2023-11-05 MED ORDER — EPOETIN ALFA-EPBX 10000 UNIT/ML IJ SOLN
20000.0000 [IU] | INTRAMUSCULAR | Status: DC
  Administered 2023-11-05: 20000 [IU] via SUBCUTANEOUS

## 2023-11-05 MED ORDER — EPOETIN ALFA-EPBX 10000 UNIT/ML IJ SOLN
INTRAMUSCULAR | Status: AC
  Filled 2023-11-05: qty 2

## 2023-11-19 ENCOUNTER — Ambulatory Visit (HOSPITAL_COMMUNITY)
Admission: RE | Admit: 2023-11-19 | Discharge: 2023-11-19 | Disposition: A | Discharge status: Home / Self Care | Payer: Medicare PPO | Source: Ambulatory Visit | Attending: Nephrology

## 2023-11-19 VITALS — BP 153/54 | HR 70 | Temp 97.4°F | Resp 17

## 2023-11-19 DIAGNOSIS — D631 Anemia in chronic kidney disease: Secondary | ICD-10-CM

## 2023-11-19 DIAGNOSIS — N183 Chronic kidney disease, stage 3 unspecified: Secondary | ICD-10-CM | POA: Diagnosis not present

## 2023-11-19 DIAGNOSIS — I10 Essential (primary) hypertension: Secondary | ICD-10-CM | POA: Diagnosis not present

## 2023-11-19 DIAGNOSIS — N170 Acute kidney failure with tubular necrosis: Secondary | ICD-10-CM | POA: Diagnosis not present

## 2023-11-19 DIAGNOSIS — M79601 Pain in right arm: Secondary | ICD-10-CM | POA: Diagnosis not present

## 2023-11-19 DIAGNOSIS — N189 Chronic kidney disease, unspecified: Secondary | ICD-10-CM | POA: Diagnosis not present

## 2023-11-19 LAB — POCT HEMOGLOBIN-HEMACUE: Hemoglobin: 11.4 g/dL — ABNORMAL LOW (ref 12.0–15.0)

## 2023-11-19 MED ORDER — EPOETIN ALFA-EPBX 10000 UNIT/ML IJ SOLN
20000.0000 [IU] | INTRAMUSCULAR | Status: DC
  Administered 2023-11-19: 20000 [IU] via SUBCUTANEOUS

## 2023-11-19 MED ORDER — EPOETIN ALFA-EPBX 10000 UNIT/ML IJ SOLN
INTRAMUSCULAR | Status: AC
  Filled 2023-11-19: qty 2

## 2023-11-21 ENCOUNTER — Encounter (HOSPITAL_COMMUNITY): Payer: Medicare PPO

## 2023-11-25 DIAGNOSIS — D631 Anemia in chronic kidney disease: Secondary | ICD-10-CM | POA: Diagnosis not present

## 2023-11-25 DIAGNOSIS — N2581 Secondary hyperparathyroidism of renal origin: Secondary | ICD-10-CM | POA: Diagnosis not present

## 2023-11-25 DIAGNOSIS — N185 Chronic kidney disease, stage 5: Secondary | ICD-10-CM | POA: Diagnosis not present

## 2023-11-25 DIAGNOSIS — I120 Hypertensive chronic kidney disease with stage 5 chronic kidney disease or end stage renal disease: Secondary | ICD-10-CM | POA: Diagnosis not present

## 2023-12-03 ENCOUNTER — Ambulatory Visit (HOSPITAL_COMMUNITY)
Admission: RE | Admit: 2023-12-03 | Discharge: 2023-12-03 | Disposition: A | Discharge status: Home / Self Care | Payer: Medicare PPO | Source: Ambulatory Visit | Attending: Nephrology | Admitting: Nephrology

## 2023-12-03 VITALS — BP 141/44 | HR 9 | Temp 97.3°F | Resp 18

## 2023-12-03 DIAGNOSIS — N170 Acute kidney failure with tubular necrosis: Secondary | ICD-10-CM | POA: Insufficient documentation

## 2023-12-03 DIAGNOSIS — N189 Chronic kidney disease, unspecified: Secondary | ICD-10-CM | POA: Diagnosis not present

## 2023-12-03 DIAGNOSIS — D631 Anemia in chronic kidney disease: Secondary | ICD-10-CM | POA: Insufficient documentation

## 2023-12-03 DIAGNOSIS — N183 Chronic kidney disease, stage 3 unspecified: Secondary | ICD-10-CM | POA: Insufficient documentation

## 2023-12-03 LAB — DIFFERENTIAL
Abs Immature Granulocytes: 0.02 10*3/uL (ref 0.00–0.07)
Basophils Absolute: 0.1 10*3/uL (ref 0.0–0.1)
Basophils Relative: 1 %
Eosinophils Absolute: 0.2 10*3/uL (ref 0.0–0.5)
Eosinophils Relative: 2 %
Immature Granulocytes: 0 %
Lymphocytes Relative: 21 %
Lymphs Abs: 2.2 10*3/uL (ref 0.7–4.0)
Monocytes Absolute: 0.7 10*3/uL (ref 0.1–1.0)
Monocytes Relative: 7 %
Neutro Abs: 7.2 10*3/uL (ref 1.7–7.7)
Neutrophils Relative %: 69 %

## 2023-12-03 LAB — MAGNESIUM: Magnesium: 1.7 mg/dL (ref 1.7–2.4)

## 2023-12-03 LAB — RENAL FUNCTION PANEL
Albumin: 3.6 g/dL (ref 3.5–5.0)
Anion gap: 10 (ref 5–15)
BUN: 79 mg/dL — ABNORMAL HIGH (ref 8–23)
CO2: 21 mmol/L — ABNORMAL LOW (ref 22–32)
Calcium: 9.4 mg/dL (ref 8.9–10.3)
Chloride: 107 mmol/L (ref 98–111)
Creatinine, Ser: 3.29 mg/dL — ABNORMAL HIGH (ref 0.44–1.00)
GFR, Estimated: 14 mL/min — ABNORMAL LOW (ref >60)
Glucose, Bld: 91 mg/dL (ref 70–99)
Phosphorus: 4.9 mg/dL — ABNORMAL HIGH (ref 2.5–4.6)
Potassium: 4.9 mmol/L (ref 3.5–5.1)
Sodium: 138 mmol/L (ref 135–145)

## 2023-12-03 LAB — CBC
HCT: 37 % (ref 36.0–46.0)
Hemoglobin: 11.4 g/dL — ABNORMAL LOW (ref 12.0–15.0)
MCH: 31.5 pg (ref 26.0–34.0)
MCHC: 30.8 g/dL (ref 30.0–36.0)
MCV: 102.2 fL — ABNORMAL HIGH (ref 80.0–100.0)
Platelets: 132 10*3/uL — ABNORMAL LOW (ref 150–400)
RBC: 3.62 MIL/uL — ABNORMAL LOW (ref 3.87–5.11)
RDW: 13.2 % (ref 11.5–15.5)
WBC: 10.5 10*3/uL (ref 4.0–10.5)
nRBC: 0 % (ref 0.0–0.2)

## 2023-12-03 LAB — IRON AND TIBC
Iron: 94 ug/dL (ref 28–170)
Saturation Ratios: 33 % — ABNORMAL HIGH (ref 10.4–31.8)
TIBC: 284 ug/dL (ref 250–450)
UIBC: 190 ug/dL

## 2023-12-03 LAB — FERRITIN: Ferritin: 234 ng/mL (ref 11–307)

## 2023-12-03 LAB — POCT HEMOGLOBIN-HEMACUE: Hemoglobin: 11.3 g/dL — ABNORMAL LOW (ref 12.0–15.0)

## 2023-12-03 MED ORDER — EPOETIN ALFA-EPBX 10000 UNIT/ML IJ SOLN
20000.0000 [IU] | INTRAMUSCULAR | Status: DC
  Administered 2023-12-03: 20000 [IU] via SUBCUTANEOUS

## 2023-12-03 MED ORDER — EPOETIN ALFA-EPBX 10000 UNIT/ML IJ SOLN
INTRAMUSCULAR | Status: AC
  Filled 2023-12-03: qty 2

## 2023-12-12 ENCOUNTER — Ambulatory Visit: Payer: Medicare PPO

## 2023-12-12 VITALS — Ht 63.0 in | Wt 182.0 lb

## 2023-12-12 DIAGNOSIS — Z0000 Encounter for general adult medical examination without abnormal findings: Secondary | ICD-10-CM

## 2023-12-12 DIAGNOSIS — E66811 Obesity, class 1: Secondary | ICD-10-CM

## 2023-12-12 DIAGNOSIS — E1122 Type 2 diabetes mellitus with diabetic chronic kidney disease: Secondary | ICD-10-CM

## 2023-12-12 DIAGNOSIS — E1159 Type 2 diabetes mellitus with other circulatory complications: Secondary | ICD-10-CM

## 2023-12-12 NOTE — Progress Notes (Signed)
Subjective:   Kimberly Carlson is a 78 y.o. female who presents for an Initial Medicare Annual Wellness Visit.  Visit Complete: Virtual I connected with  Kimberly Carlson on 12/12/23 by a audio enabled telemedicine application and verified that I am speaking with the correct person using two identifiers.  Patient Location: Home  Provider Location: Office/Clinic  I discussed the limitations of evaluation and management by telemedicine. The patient expressed understanding and agreed to proceed.  Vital Signs: Because this visit was a virtual/telehealth visit, some criteria may be missing or patient reported. Any vitals not documented were not able to be obtained and vitals that have been documented are patient reported.  Interactive audio and video telecommunications were attempted between this provider and patient, however failed, due to patient having technical difficulties OR patient did not have access to video capability.  We continued and completed visit with audio only.  Cardiac Risk Factors include: advanced age (>79men, >77 women);sedentary lifestyle     Objective:    Today's Vitals   12/12/23 1045  Weight: 182 lb (82.6 kg)  Height: 5\' 3"  (1.6 m)  PainSc: 0-No pain   Body mass index is 32.24 kg/m.     12/12/2023   10:47 AM 01/14/2023    2:18 PM 12/31/2022   10:54 AM 11/30/2022   10:19 AM 11/14/2022    8:13 PM 09/13/2021    3:11 PM 10/30/2018    3:01 PM  Advanced Directives  Does Patient Have a Medical Advance Directive? No No No No No No No  Would patient like information on creating a medical advance directive? No - Patient declined No - Patient declined No - Patient declined No - Patient declined No - Patient declined No - Patient declined No - Patient declined    Current Medications (verified) Outpatient Encounter Medications as of 12/12/2023  Medication Sig   Accu-Chek Softclix Lancets lancets USE TO CHECK BLOOD SUGAR 6 TIMES PER DAY   acetaminophen  (TYLENOL) 500 MG tablet Take 1,000 mg by mouth every 6 (six) hours as needed (for headaches).   aspirin 81 MG chewable tablet Chew 81 mg by mouth daily.   atorvastatin (LIPITOR) 40 MG tablet TAKE 1 TABLET BY MOUTH EVERY DAY AT 6 PM   Blood Glucose Monitoring Suppl (ACCU-CHEK GUIDE ME) w/Device KIT Use to check blood sugar 6x per day. E11.65   calcium-vitamin D (OSCAL 500/200 D-3) 500-200 MG-UNIT tablet Take 2 tablets by mouth daily with breakfast. (Patient taking differently: Take 1 tablet by mouth daily with breakfast.)   carvedilol (COREG) 6.25 MG tablet Take 4 tablets (25 mg total) by mouth 2 (two) times daily.   cephALEXin (KEFLEX) 500 MG capsule Take 1 capsule (500 mg total) by mouth 4 (four) times daily.   diclofenac sodium (VOLTAREN) 1 % GEL Apply 4 g topically 4 (four) times daily. (Patient not taking: No sig reported)   DROPLET INSULIN SYRINGE 31G X 5/16" 1 ML MISC USE DAILY   fluticasone (FLONASE) 50 MCG/ACT nasal spray Place 2 sprays into both nostrils daily.   furosemide (LASIX) 40 MG tablet TAKE 1 TABLET (40 MG TOTAL) BY MOUTH DAILY.   gabapentin (NEURONTIN) 100 MG capsule Take 100 mg by mouth at bedtime.   glucose blood (ACCU-CHEK GUIDE) test strip USE TO CHECK BLOOD SUGAR 6 TIMES PER DAY   hydrALAZINE (APRESOLINE) 50 MG tablet TAKE 1 AND 1/2 TABLETS THREE TIMES DAILY (Patient taking differently: 100 mg in the morning and at bedtime. TAKE 1 AND 1/2  TABLETS THREE TIMES DAILY)   insulin glargine (LANTUS) 100 UNIT/ML injection INJECT 28 UNITS UNDER THE SKIN EVERY DAY (DISCARD VIAL 28 DAYS AFTER OPENING) NEED MD APPOINTMENT FOR REFILLS   lidocaine (LIDODERM) 5 % Place 1 patch onto the skin daily. Remove & Discard patch within 12 hours or as directed by MD   polyethylene glycol (MIRALAX / GLYCOLAX) packet Take 17 g by mouth daily as needed for mild constipation.   No facility-administered encounter medications on file as of 12/12/2023.    Allergies (verified) Amlodipine, Augmentin  [amoxicillin-pot clavulanate], Clonidine derivatives, Doxycycline, and Metformin   History: Past Medical History:  Diagnosis Date   Back pain    herniated disc lower back   Diabetes mellitus without complication (HCC)    Hypertension    Past Surgical History:  Procedure Laterality Date   ABDOMINAL HYSTERECTOMY     Family History  Adopted: Yes   Social History   Socioeconomic History   Marital status: Widowed    Spouse name: Not on file   Number of children: Not on file   Years of education: Not on file   Highest education level: Not on file  Occupational History   Not on file  Tobacco Use   Smoking status: Former    Current packs/day: 0.00    Average packs/day: 1 pack/day for 1.2 years (1.2 ttl pk-yrs)    Types: Cigarettes    Start date: 04/26/2014    Quit date: 07/23/2015    Years since quitting: 8.3   Smokeless tobacco: Never   Tobacco comments:    Started in her 51's with multiple quit attempts for about 1 year at a time.  Vaping Use   Vaping status: Never Used  Substance and Sexual Activity   Alcohol use: No   Drug use: No   Sexual activity: Not on file  Other Topics Concern   Not on file  Social History Narrative   Not on file   Social Drivers of Health   Financial Resource Strain: Low Risk  (12/12/2023)   Overall Financial Resource Strain (CARDIA)    Difficulty of Paying Living Expenses: Not hard at all  Food Insecurity: No Food Insecurity (12/12/2023)   Hunger Vital Sign    Worried About Running Out of Food in the Last Year: Never true    Ran Out of Food in the Last Year: Never true  Transportation Needs: No Transportation Needs (12/12/2023)   PRAPARE - Administrator, Civil Service (Medical): No    Lack of Transportation (Non-Medical): No  Physical Activity: Inactive (12/12/2023)   Exercise Vital Sign    Days of Exercise per Week: 0 days    Minutes of Exercise per Session: 0 min  Stress: No Stress Concern Present (12/12/2023)   [employer] of Occupational Health - Occupational Stress Questionnaire    Feeling of Stress : Not at all  Social Connections: Socially Integrated (12/12/2023)   Social Connection and Isolation Panel [NHANES]    Frequency of Communication with Friends and Family: More than three times a week    Frequency of Social Gatherings with Friends and Family: More than three times a week    Attends Religious Services: More than 4 times per year    Active Member of [employer] or Organizations: Yes    Attends Engineer, structural: More than 4 times per year    Marital Status: Married    Tobacco Counseling Counseling given: Not Answered Tobacco comments: Started in her 29's  with multiple quit attempts for about 1 year at a time.   Clinical Intake:  Pre-visit preparation completed: Yes  Pain : No/denies pain Pain Score: 0-No pain     BMI - recorded: 32.34 Nutritional Status: BMI > 30  Obese Nutritional Risks: None Diabetes: Yes CBG done?: No Did pt. bring in CBG monitor from home?: No  How often do you need to have someone help you when you read instructions, pamphlets, or other written materials from your doctor or pharmacy?: 1 - Never What is the last grade level you completed in school?: HSG  Interpreter Needed?: No  Information entered by :: Lakyra Tippins N. Osbaldo Mark, LPN.   Activities of Daily Living    12/12/2023   10:50 AM  In your present state of health, do you have any difficulty performing the following activities:  Hearing? 0  Vision? 0  Difficulty concentrating or making decisions? 0  Walking or climbing stairs? 1  Dressing or bathing? 0  Doing errands, shopping? 0  Preparing Food and eating ? N  Using the Toilet? N  In the past six months, have you accidently leaked urine? N  Do you have problems with loss of bowel control? N  Managing your Medications? N  Managing your Finances? N  Housekeeping or managing your Housekeeping? N    Patient Care Team: Tiffany Kocher, DO as PCP - General (Family Medicine)  Indicate any recent Medical Services you may have received from other than [facility] providers in the past year (date may be approximate).     Assessment:   This is a routine wellness examination for [place].  Hearing/Vision screen Hearing Screening - Comments:: Denies hearing difficulties.   Vision Screening - Comments:: Wears rx glasses - up to date with routine eye exams with [facility]    Goals Addressed             This Visit's Progress    Client understands the importance of follow-up with providers by attending scheduled visits        Depression Screen    12/12/2023   10:49 AM 01/14/2023    2:19 PM 12/31/2022   10:54 AM 11/30/2022   10:19 AM 09/13/2021    3:12 PM 04/07/2020    1:39 PM 07/30/2019    2:02 PM  PHQ 2/9 Scores  PHQ - 2 Score 0 0 0 0 0 0 0  PHQ- 9 Score 1 0 1 3 6       Fall Risk    12/12/2023   10:48 AM 01/14/2023    2:19 PM 12/31/2022   10:54 AM 11/30/2022   10:19 AM 09/13/2021    3:11 PM  Fall Risk   Falls in the past year? 0 0 0 0 1  Number falls in past yr: 0 0 0 0 1  Injury with Fall? 0 0 0 0 1  Risk for fall due to : No Fall Risks    History of fall(s)  Follow up Falls prevention discussed        MEDICARE RISK AT HOME: Medicare Risk at Home Any stairs in or around the home?: No If so, are there any without handrails?: No Home free of loose throw rugs in walkways, pet beds, electrical cords, etc?: Yes Adequate lighting in your home to reduce risk of falls?: Yes Life alert?: No Use of a cane, walker or w/c?: Yes Grab bars in the bathroom?: No (very careful) Shower chair or bench in shower?: No Elevated toilet seat or  a handicapped toilet?: No  TIMED UP AND GO:  Was the test performed? No    Cognitive Function:    12/12/2023   10:49 AM  MMSE - Mini Mental State Exam  Not completed: Unable to complete        12/12/2023   10:48 AM  6CIT Screen  What Year? 0 points  What month? 0 points   What time? 0 points  Count back from 20 0 points  Months in reverse 0 points  Repeat phrase 0 points  Total Score 0 points    Immunizations Immunization History  Administered Date(s) Administered   Td 07/31/2003   Tdap 01/24/2014    TDAP status: Up to date  Flu Vaccine status: Declined, Education has been provided regarding the importance of this vaccine but patient still declined. Advised may receive this vaccine at local pharmacy or Health Dept. Aware to provide a copy of the vaccination record if obtained from local pharmacy or Health Dept. Verbalized acceptance and understanding.  Pneumococcal vaccine status: Declined,  Education has been provided regarding the importance of this vaccine but patient still declined. Advised may receive this vaccine at local pharmacy or Health Dept. Aware to provide a copy of the vaccination record if obtained from local pharmacy or Health Dept. Verbalized acceptance and understanding.   Covid-19 vaccine status: Declined, Education has been provided regarding the importance of this vaccine but patient still declined. Advised may receive this vaccine at local pharmacy or Health Dept.or vaccine clinic. Aware to provide a copy of the vaccination record if obtained from local pharmacy or Health Dept. Verbalized acceptance and understanding.  Qualifies for Shingles Vaccine? Yes   Zostavax completed No   Shingrix Completed?: No.    Education has been provided regarding the importance of this vaccine. Patient has been advised to call insurance company to determine out of pocket expense if they have not yet received this vaccine. Advised may also receive vaccine at local pharmacy or Health Dept. Verbalized acceptance and understanding.  Screening Tests Health Maintenance  Topic Date Due   Pneumonia Vaccine 29+ Years old (1 of 2 - PCV) Never done   Zoster Vaccines- Shingrix (1 of 2) Never done   DEXA SCAN  Never done   Diabetic kidney evaluation - Urine  ACR  01/28/2019   OPHTHALMOLOGY EXAM  03/14/2021   INFLUENZA VACCINE  Never done   HEMOGLOBIN A1C  07/01/2023   COVID-19 Vaccine (1 - 2024-25 season) Never done   FOOT EXAM  01/01/2024   DTaP/Tdap/Td (3 - Td or Tdap) 01/25/2024   Diabetic kidney evaluation - eGFR measurement  12/02/2024   Medicare Annual Wellness (AWV)  12/11/2024   HPV VACCINES  Aged Out   Hepatitis C Screening  Discontinued    Health Maintenance  Health Maintenance Due  Topic Date Due   Pneumonia Vaccine 58+ Years old (1 of 2 - PCV) Never done   Zoster Vaccines- Shingrix (1 of 2) Never done   DEXA SCAN  Never done   Diabetic kidney evaluation - Urine ACR  01/28/2019   OPHTHALMOLOGY EXAM  03/14/2021   INFLUENZA VACCINE  Never done   HEMOGLOBIN A1C  07/01/2023   COVID-19 Vaccine (1 - 2024-25 season) Never done    Colorectal cancer screening: No longer required.   Mammogram status: No longer required due to age.  Bone Density status: Never done.  Lung Cancer Screening: (Low Dose CT Chest recommended if Age 48-80 years, 20 pack-year currently smoking OR have quit w/in  15years.) does not qualify.   Lung Cancer Screening Referral: no  Additional Screening:  Hepatitis C Screening: does not qualify; Completed 11/11/2017  Vision Screening: Recommended annual ophthalmology exams for early detection of glaucoma and other disorders of the eye. Is the patient up to date with their annual eye exam?  No  Who is the provider or what is the name of the office in which the patient attends annual eye exams? [facility] If pt is not established with a provider, would they like to be referred to a provider to establish care? No .   Dental Screening: Recommended annual dental exams for proper oral hygiene  Diabetic Foot Exam: Diabetic Foot Exam: Completed 12/31/2022  Community Resource Referral / Chronic Care Management: CRR required this visit?  No   CCM required this visit?  No     Plan:     I have  personally reviewed and noted the following in the patient's chart:   Medical and social history Use of alcohol, tobacco or illicit drugs  Current medications and supplements including opioid prescriptions. Patient is not currently taking opioid prescriptions. Functional ability and status Nutritional status Physical activity Advanced directives List of other physicians Hospitalizations, surgeries, and ER visits in previous 12 months Vitals Screenings to include cognitive, depression, and falls Referrals and appointments  In addition, I have reviewed and discussed with patient certain preventive protocols, quality metrics, and best practice recommendations. A written personalized care plan for preventive services as well as general preventive health recommendations were provided to patient.     Mickeal Needy, LPN   8/41/3244   After Visit Summary: (MyChart) Due to this being a telephonic visit, the after visit summary with patients personalized plan was offered to patient via MyChart   Nurse Notes: See routing comments.

## 2023-12-17 ENCOUNTER — Encounter (HOSPITAL_COMMUNITY)
Admission: RE | Admit: 2023-12-17 | Discharge: 2023-12-17 | Disposition: A | Discharge status: Home / Self Care | Payer: Medicare PPO | Source: Ambulatory Visit | Attending: Nephrology | Admitting: Nephrology

## 2023-12-17 VITALS — BP 164/53 | HR 66 | Temp 97.7°F | Resp 17

## 2023-12-17 DIAGNOSIS — N170 Acute kidney failure with tubular necrosis: Secondary | ICD-10-CM | POA: Diagnosis not present

## 2023-12-17 DIAGNOSIS — N189 Chronic kidney disease, unspecified: Secondary | ICD-10-CM | POA: Insufficient documentation

## 2023-12-17 DIAGNOSIS — N183 Chronic kidney disease, stage 3 unspecified: Secondary | ICD-10-CM | POA: Insufficient documentation

## 2023-12-17 DIAGNOSIS — D631 Anemia in chronic kidney disease: Secondary | ICD-10-CM | POA: Insufficient documentation

## 2023-12-17 LAB — POCT HEMOGLOBIN-HEMACUE: Hemoglobin: 11.3 g/dL — ABNORMAL LOW (ref 12.0–15.0)

## 2023-12-17 MED ORDER — EPOETIN ALFA-EPBX 10000 UNIT/ML IJ SOLN
INTRAMUSCULAR | Status: AC
  Filled 2023-12-17: qty 2

## 2023-12-17 MED ORDER — EPOETIN ALFA-EPBX 10000 UNIT/ML IJ SOLN
20000.0000 [IU] | INTRAMUSCULAR | Status: DC
  Administered 2023-12-17: 20000 [IU] via SUBCUTANEOUS

## 2023-12-26 DIAGNOSIS — N184 Chronic kidney disease, stage 4 (severe): Secondary | ICD-10-CM | POA: Insufficient documentation

## 2023-12-26 DIAGNOSIS — E1122 Type 2 diabetes mellitus with diabetic chronic kidney disease: Secondary | ICD-10-CM | POA: Insufficient documentation

## 2023-12-26 DIAGNOSIS — E66811 Obesity, class 1: Secondary | ICD-10-CM | POA: Insufficient documentation

## 2023-12-26 NOTE — Progress Notes (Signed)
I have reviewed the patient's updated history, problem list, medications and allergies. I have reviewed the AWV provider's notations.

## 2023-12-26 NOTE — Addendum Note (Signed)
Addended byPerley Jain, Chieko Neises D on: 12/26/2023 11:00 AM   Modules accepted: Orders

## 2023-12-31 ENCOUNTER — Ambulatory Visit (HOSPITAL_COMMUNITY)
Admission: RE | Admit: 2023-12-31 | Discharge: 2023-12-31 | Disposition: A | Discharge status: Home / Self Care | Payer: Medicare PPO | Source: Ambulatory Visit | Attending: Nephrology | Admitting: Nephrology

## 2023-12-31 VITALS — BP 150/52 | HR 72 | Temp 97.3°F | Resp 17

## 2023-12-31 DIAGNOSIS — N183 Chronic kidney disease, stage 3 unspecified: Secondary | ICD-10-CM | POA: Insufficient documentation

## 2023-12-31 DIAGNOSIS — D631 Anemia in chronic kidney disease: Secondary | ICD-10-CM | POA: Diagnosis not present

## 2023-12-31 DIAGNOSIS — N189 Chronic kidney disease, unspecified: Secondary | ICD-10-CM | POA: Diagnosis not present

## 2023-12-31 DIAGNOSIS — N170 Acute kidney failure with tubular necrosis: Secondary | ICD-10-CM | POA: Insufficient documentation

## 2023-12-31 LAB — DIFFERENTIAL
Abs Immature Granulocytes: 0.01 10*3/uL (ref 0.00–0.07)
Basophils Absolute: 0.1 10*3/uL (ref 0.0–0.1)
Basophils Relative: 1 %
Eosinophils Absolute: 0.3 10*3/uL (ref 0.0–0.5)
Eosinophils Relative: 3 %
Immature Granulocytes: 0 %
Lymphocytes Relative: 24 %
Lymphs Abs: 1.8 10*3/uL (ref 0.7–4.0)
Monocytes Absolute: 0.5 10*3/uL (ref 0.1–1.0)
Monocytes Relative: 6 %
Neutro Abs: 4.9 10*3/uL (ref 1.7–7.7)
Neutrophils Relative %: 66 %

## 2023-12-31 LAB — POCT HEMOGLOBIN-HEMACUE: Hemoglobin: 11.1 g/dL — ABNORMAL LOW (ref 12.0–15.0)

## 2023-12-31 LAB — RENAL FUNCTION PANEL
Albumin: 3.4 g/dL — ABNORMAL LOW (ref 3.5–5.0)
Anion gap: 12 (ref 5–15)
BUN: 62 mg/dL — ABNORMAL HIGH (ref 8–23)
CO2: 19 mmol/L — ABNORMAL LOW (ref 22–32)
Calcium: 9.2 mg/dL (ref 8.9–10.3)
Chloride: 108 mmol/L (ref 98–111)
Creatinine, Ser: 2.94 mg/dL — ABNORMAL HIGH (ref 0.44–1.00)
GFR, Estimated: 16 mL/min — ABNORMAL LOW (ref >60)
Glucose, Bld: 139 mg/dL — ABNORMAL HIGH (ref 70–99)
Phosphorus: 4 mg/dL (ref 2.5–4.6)
Potassium: 4.1 mmol/L (ref 3.5–5.1)
Sodium: 139 mmol/L (ref 135–145)

## 2023-12-31 LAB — IRON AND TIBC
Iron: 114 ug/dL (ref 28–170)
Saturation Ratios: 43 % — ABNORMAL HIGH (ref 10.4–31.8)
TIBC: 263 ug/dL (ref 250–450)
UIBC: 149 ug/dL

## 2023-12-31 LAB — CBC
HCT: 36 % (ref 36.0–46.0)
Hemoglobin: 11 g/dL — ABNORMAL LOW (ref 12.0–15.0)
MCH: 31.3 pg (ref 26.0–34.0)
MCHC: 30.6 g/dL (ref 30.0–36.0)
MCV: 102.3 fL — ABNORMAL HIGH (ref 80.0–100.0)
Platelets: 126 10*3/uL — ABNORMAL LOW (ref 150–400)
RBC: 3.52 MIL/uL — ABNORMAL LOW (ref 3.87–5.11)
RDW: 13.4 % (ref 11.5–15.5)
WBC: 7.5 10*3/uL (ref 4.0–10.5)
nRBC: 0 % (ref 0.0–0.2)

## 2023-12-31 LAB — FERRITIN: Ferritin: 221 ng/mL (ref 11–307)

## 2023-12-31 LAB — MAGNESIUM: Magnesium: 1.6 mg/dL — ABNORMAL LOW (ref 1.7–2.4)

## 2023-12-31 MED ORDER — EPOETIN ALFA-EPBX 10000 UNIT/ML IJ SOLN
INTRAMUSCULAR | Status: AC
  Filled 2023-12-31: qty 2

## 2023-12-31 MED ORDER — EPOETIN ALFA-EPBX 10000 UNIT/ML IJ SOLN
20000.0000 [IU] | INTRAMUSCULAR | Status: DC
Start: 2023-12-31 — End: 2025-01-12
  Administered 2023-12-31: 20000 [IU] via SUBCUTANEOUS

## 2024-01-14 ENCOUNTER — Encounter (HOSPITAL_COMMUNITY)
Admission: RE | Admit: 2024-01-14 | Discharge: 2024-01-14 | Disposition: A | Discharge status: Home / Self Care | Payer: Medicare PPO | Source: Ambulatory Visit | Attending: Nephrology | Admitting: Nephrology

## 2024-01-14 VITALS — BP 158/58 | HR 70 | Temp 97.0°F | Resp 16

## 2024-01-14 DIAGNOSIS — N170 Acute kidney failure with tubular necrosis: Secondary | ICD-10-CM | POA: Insufficient documentation

## 2024-01-14 DIAGNOSIS — N189 Chronic kidney disease, unspecified: Secondary | ICD-10-CM | POA: Diagnosis not present

## 2024-01-14 DIAGNOSIS — D631 Anemia in chronic kidney disease: Secondary | ICD-10-CM | POA: Insufficient documentation

## 2024-01-14 DIAGNOSIS — N183 Chronic kidney disease, stage 3 unspecified: Secondary | ICD-10-CM | POA: Insufficient documentation

## 2024-01-14 LAB — POCT HEMOGLOBIN-HEMACUE: Hemoglobin: 11.6 g/dL — ABNORMAL LOW (ref 12.0–15.0)

## 2024-01-14 MED ORDER — EPOETIN ALFA-EPBX 10000 UNIT/ML IJ SOLN: 20000.0000 [IU] | INTRAMUSCULAR | Status: DC

## 2024-01-14 MED ORDER — EPOETIN ALFA-EPBX 10000 UNIT/ML IJ SOLN
INTRAMUSCULAR | Status: AC
  Administered 2024-01-14: 20000 [IU] via SUBCUTANEOUS
  Filled 2024-01-14: qty 2

## 2024-01-28 ENCOUNTER — Encounter (HOSPITAL_COMMUNITY)
Admission: RE | Admit: 2024-01-28 | Discharge: 2024-01-28 | Disposition: A | Discharge status: Home / Self Care | Payer: Medicare PPO | Source: Ambulatory Visit | Attending: Nephrology | Admitting: Nephrology

## 2024-01-28 VITALS — BP 159/55 | HR 66 | Temp 97.1°F | Resp 16

## 2024-01-28 DIAGNOSIS — N170 Acute kidney failure with tubular necrosis: Secondary | ICD-10-CM | POA: Insufficient documentation

## 2024-01-28 DIAGNOSIS — D631 Anemia in chronic kidney disease: Secondary | ICD-10-CM | POA: Insufficient documentation

## 2024-01-28 DIAGNOSIS — N189 Chronic kidney disease, unspecified: Secondary | ICD-10-CM | POA: Insufficient documentation

## 2024-01-28 DIAGNOSIS — N183 Chronic kidney disease, stage 3 unspecified: Secondary | ICD-10-CM | POA: Insufficient documentation

## 2024-01-28 LAB — DIFFERENTIAL
Abs Immature Granulocytes: 0.03 10*3/uL (ref 0.00–0.07)
Basophils Absolute: 0.1 10*3/uL (ref 0.0–0.1)
Basophils Relative: 1 %
Eosinophils Absolute: 0.2 10*3/uL (ref 0.0–0.5)
Eosinophils Relative: 2 %
Immature Granulocytes: 0 %
Lymphocytes Relative: 27 %
Lymphs Abs: 2.2 10*3/uL (ref 0.7–4.0)
Monocytes Absolute: 0.6 10*3/uL (ref 0.1–1.0)
Monocytes Relative: 7 %
Neutro Abs: 5.2 10*3/uL (ref 1.7–7.7)
Neutrophils Relative %: 63 %

## 2024-01-28 LAB — FERRITIN: Ferritin: 189 ng/mL (ref 11–307)

## 2024-01-28 LAB — IRON AND TIBC
Iron: 89 ug/dL (ref 28–170)
Saturation Ratios: 34 % — ABNORMAL HIGH (ref 10.4–31.8)
TIBC: 263 ug/dL (ref 250–450)
UIBC: 174 ug/dL

## 2024-01-28 LAB — RENAL FUNCTION PANEL
Albumin: 3.4 g/dL — ABNORMAL LOW (ref 3.5–5.0)
Anion gap: 7 (ref 5–15)
BUN: 65 mg/dL — ABNORMAL HIGH (ref 8–23)
CO2: 21 mmol/L — ABNORMAL LOW (ref 22–32)
Calcium: 8.5 mg/dL — ABNORMAL LOW (ref 8.9–10.3)
Chloride: 109 mmol/L (ref 98–111)
Creatinine, Ser: 2.98 mg/dL — ABNORMAL HIGH (ref 0.44–1.00)
GFR, Estimated: 15 mL/min — ABNORMAL LOW (ref >60)
Glucose, Bld: 127 mg/dL — ABNORMAL HIGH (ref 70–99)
Phosphorus: 4.5 mg/dL (ref 2.5–4.6)
Potassium: 4.7 mmol/L (ref 3.5–5.1)
Sodium: 137 mmol/L (ref 135–145)

## 2024-01-28 LAB — CBC
HCT: 35.5 % — ABNORMAL LOW (ref 36.0–46.0)
Hemoglobin: 11 g/dL — ABNORMAL LOW (ref 12.0–15.0)
MCH: 31.4 pg (ref 26.0–34.0)
MCHC: 31 g/dL (ref 30.0–36.0)
MCV: 101.4 fL — ABNORMAL HIGH (ref 80.0–100.0)
Platelets: 128 10*3/uL — ABNORMAL LOW (ref 150–400)
RBC: 3.5 MIL/uL — ABNORMAL LOW (ref 3.87–5.11)
RDW: 13.6 % (ref 11.5–15.5)
WBC: 8.2 10*3/uL (ref 4.0–10.5)
nRBC: 0 % (ref 0.0–0.2)

## 2024-01-28 LAB — MAGNESIUM: Magnesium: 1.8 mg/dL (ref 1.7–2.4)

## 2024-01-28 LAB — POCT HEMOGLOBIN-HEMACUE: Hemoglobin: 11.1 g/dL — ABNORMAL LOW (ref 12.0–15.0)

## 2024-01-28 MED ORDER — EPOETIN ALFA-EPBX 10000 UNIT/ML IJ SOLN
INTRAMUSCULAR | Status: AC
  Filled 2024-01-28: qty 2

## 2024-01-28 MED ORDER — EPOETIN ALFA-EPBX 10000 UNIT/ML IJ SOLN
20000.0000 [IU] | INTRAMUSCULAR | Status: DC
  Administered 2024-01-28: 20000 [IU] via SUBCUTANEOUS

## 2024-02-11 ENCOUNTER — Encounter (HOSPITAL_COMMUNITY)
Admission: RE | Admit: 2024-02-11 | Discharge: 2024-02-11 | Disposition: A | Discharge status: Home / Self Care | Payer: Medicare PPO | Source: Ambulatory Visit | Attending: Nephrology

## 2024-02-11 VITALS — BP 151/55 | HR 64 | Temp 97.2°F | Resp 16

## 2024-02-11 DIAGNOSIS — D631 Anemia in chronic kidney disease: Secondary | ICD-10-CM

## 2024-02-11 DIAGNOSIS — N189 Chronic kidney disease, unspecified: Secondary | ICD-10-CM | POA: Diagnosis not present

## 2024-02-11 DIAGNOSIS — N170 Acute kidney failure with tubular necrosis: Secondary | ICD-10-CM | POA: Diagnosis not present

## 2024-02-11 DIAGNOSIS — N183 Chronic kidney disease, stage 3 unspecified: Secondary | ICD-10-CM | POA: Diagnosis not present

## 2024-02-11 LAB — POCT HEMOGLOBIN-HEMACUE: Hemoglobin: 11.7 g/dL — ABNORMAL LOW (ref 12.0–15.0)

## 2024-02-11 MED ORDER — EPOETIN ALFA-EPBX 10000 UNIT/ML IJ SOLN
INTRAMUSCULAR | Status: AC
  Filled 2024-02-11: qty 2

## 2024-02-11 MED ORDER — EPOETIN ALFA-EPBX 10000 UNIT/ML IJ SOLN
20000.0000 [IU] | INTRAMUSCULAR | Status: DC
  Administered 2024-02-11: 20000 [IU] via SUBCUTANEOUS

## 2024-02-19 DIAGNOSIS — E119 Type 2 diabetes mellitus without complications: Secondary | ICD-10-CM | POA: Diagnosis not present

## 2024-02-19 DIAGNOSIS — H25811 Combined forms of age-related cataract, right eye: Secondary | ICD-10-CM | POA: Diagnosis not present

## 2024-02-19 DIAGNOSIS — Z961 Presence of intraocular lens: Secondary | ICD-10-CM | POA: Diagnosis not present

## 2024-02-19 DIAGNOSIS — H43812 Vitreous degeneration, left eye: Secondary | ICD-10-CM | POA: Diagnosis not present

## 2024-02-23 DIAGNOSIS — H2511 Age-related nuclear cataract, right eye: Secondary | ICD-10-CM | POA: Diagnosis not present

## 2024-02-25 ENCOUNTER — Ambulatory Visit (HOSPITAL_COMMUNITY)
Admission: RE | Admit: 2024-02-25 | Discharge: 2024-02-25 | Disposition: A | Discharge status: Home / Self Care | Source: Ambulatory Visit | Attending: Nephrology | Admitting: Nephrology

## 2024-02-25 VITALS — BP 170/49 | HR 62 | Temp 97.0°F | Resp 16

## 2024-02-25 DIAGNOSIS — N183 Chronic kidney disease, stage 3 unspecified: Secondary | ICD-10-CM | POA: Insufficient documentation

## 2024-02-25 DIAGNOSIS — N170 Acute kidney failure with tubular necrosis: Secondary | ICD-10-CM | POA: Insufficient documentation

## 2024-02-25 DIAGNOSIS — D631 Anemia in chronic kidney disease: Secondary | ICD-10-CM | POA: Insufficient documentation

## 2024-02-25 DIAGNOSIS — N189 Chronic kidney disease, unspecified: Secondary | ICD-10-CM | POA: Diagnosis not present

## 2024-02-25 LAB — RENAL FUNCTION PANEL
Albumin: 3.7 g/dL (ref 3.5–5.0)
Anion gap: 12 (ref 5–15)
BUN: 82 mg/dL — ABNORMAL HIGH (ref 8–23)
CO2: 19 mmol/L — ABNORMAL LOW (ref 22–32)
Calcium: 9.1 mg/dL (ref 8.9–10.3)
Chloride: 108 mmol/L (ref 98–111)
Creatinine, Ser: 3.28 mg/dL — ABNORMAL HIGH (ref 0.44–1.00)
GFR, Estimated: 14 mL/min — ABNORMAL LOW (ref >60)
Glucose, Bld: 156 mg/dL — ABNORMAL HIGH (ref 70–99)
Phosphorus: 4.3 mg/dL (ref 2.5–4.6)
Potassium: 4.1 mmol/L (ref 3.5–5.1)
Sodium: 139 mmol/L (ref 135–145)

## 2024-02-25 LAB — DIFFERENTIAL
Abs Immature Granulocytes: 0.01 10*3/uL (ref 0.00–0.07)
Basophils Absolute: 0 10*3/uL (ref 0.0–0.1)
Basophils Relative: 0 %
Eosinophils Absolute: 0.2 10*3/uL (ref 0.0–0.5)
Eosinophils Relative: 3 %
Immature Granulocytes: 0 %
Lymphocytes Relative: 22 %
Lymphs Abs: 1.7 10*3/uL (ref 0.7–4.0)
Monocytes Absolute: 0.5 10*3/uL (ref 0.1–1.0)
Monocytes Relative: 7 %
Neutro Abs: 5.3 10*3/uL (ref 1.7–7.7)
Neutrophils Relative %: 68 %

## 2024-02-25 LAB — CBC
HCT: 37 % (ref 36.0–46.0)
Hemoglobin: 11.6 g/dL — ABNORMAL LOW (ref 12.0–15.0)
MCH: 31.6 pg (ref 26.0–34.0)
MCHC: 31.4 g/dL (ref 30.0–36.0)
MCV: 100.8 fL — ABNORMAL HIGH (ref 80.0–100.0)
Platelets: 121 10*3/uL — ABNORMAL LOW (ref 150–400)
RBC: 3.67 MIL/uL — ABNORMAL LOW (ref 3.87–5.11)
RDW: 13.7 % (ref 11.5–15.5)
WBC: 7.7 10*3/uL (ref 4.0–10.5)
nRBC: 0 % (ref 0.0–0.2)

## 2024-02-25 LAB — FERRITIN: Ferritin: 187 ng/mL (ref 11–307)

## 2024-02-25 LAB — POCT HEMOGLOBIN-HEMACUE: Hemoglobin: 12 g/dL (ref 12.0–15.0)

## 2024-02-25 LAB — MAGNESIUM: Magnesium: 1.7 mg/dL (ref 1.7–2.4)

## 2024-02-25 LAB — IRON AND TIBC
Iron: 116 ug/dL (ref 28–170)
Saturation Ratios: 43 % — ABNORMAL HIGH (ref 10.4–31.8)
TIBC: 273 ug/dL (ref 250–450)
UIBC: 157 ug/dL

## 2024-02-25 MED ORDER — EPOETIN ALFA-EPBX 10000 UNIT/ML IJ SOLN: 20000.0000 [IU] | INTRAMUSCULAR | Status: DC

## 2024-02-25 MED ORDER — EPOETIN ALFA-EPBX 10000 UNIT/ML IJ SOLN
INTRAMUSCULAR | Status: DC
Start: 2024-02-25 — End: 2024-02-25
  Filled 2024-02-25: qty 2

## 2024-02-27 DIAGNOSIS — H2511 Age-related nuclear cataract, right eye: Secondary | ICD-10-CM | POA: Diagnosis not present

## 2024-02-27 DIAGNOSIS — H268 Other specified cataract: Secondary | ICD-10-CM | POA: Diagnosis not present

## 2024-02-27 DIAGNOSIS — H25811 Combined forms of age-related cataract, right eye: Secondary | ICD-10-CM | POA: Diagnosis not present

## 2024-03-10 ENCOUNTER — Encounter (HOSPITAL_COMMUNITY)
Admission: RE | Admit: 2024-03-10 | Discharge: 2024-03-10 | Disposition: A | Discharge status: Home / Self Care | Source: Ambulatory Visit | Attending: Nephrology | Admitting: Nephrology

## 2024-03-10 VITALS — BP 154/55 | HR 62 | Temp 97.8°F | Resp 16

## 2024-03-10 DIAGNOSIS — N170 Acute kidney failure with tubular necrosis: Secondary | ICD-10-CM | POA: Insufficient documentation

## 2024-03-10 DIAGNOSIS — D631 Anemia in chronic kidney disease: Secondary | ICD-10-CM | POA: Insufficient documentation

## 2024-03-10 DIAGNOSIS — N183 Chronic kidney disease, stage 3 unspecified: Secondary | ICD-10-CM | POA: Insufficient documentation

## 2024-03-10 DIAGNOSIS — N189 Chronic kidney disease, unspecified: Secondary | ICD-10-CM | POA: Insufficient documentation

## 2024-03-10 LAB — POCT HEMOGLOBIN-HEMACUE: Hemoglobin: 10.7 g/dL — ABNORMAL LOW (ref 12.0–15.0)

## 2024-03-10 MED ORDER — EPOETIN ALFA-EPBX 10000 UNIT/ML IJ SOLN: 20000.0000 [IU] | INTRAMUSCULAR | Status: DC

## 2024-03-10 MED ORDER — EPOETIN ALFA-EPBX 40000 UNIT/ML IJ SOLN
INTRAMUSCULAR | Status: AC
Start: 2024-03-10 — End: 2024-03-10
  Administered 2024-03-10: 20000 [IU] via SUBCUTANEOUS
  Filled 2024-03-10: qty 1

## 2024-03-24 ENCOUNTER — Encounter (HOSPITAL_COMMUNITY)
Admission: RE | Admit: 2024-03-24 | Discharge: 2024-03-24 | Disposition: A | Discharge status: Home / Self Care | Source: Ambulatory Visit | Attending: Nephrology | Admitting: Nephrology

## 2024-03-24 VITALS — BP 164/61 | HR 68 | Temp 97.2°F | Resp 16

## 2024-03-24 DIAGNOSIS — N189 Chronic kidney disease, unspecified: Secondary | ICD-10-CM

## 2024-03-24 DIAGNOSIS — D631 Anemia in chronic kidney disease: Secondary | ICD-10-CM | POA: Diagnosis not present

## 2024-03-24 DIAGNOSIS — N183 Chronic kidney disease, stage 3 unspecified: Secondary | ICD-10-CM | POA: Diagnosis not present

## 2024-03-24 DIAGNOSIS — N170 Acute kidney failure with tubular necrosis: Secondary | ICD-10-CM | POA: Diagnosis not present

## 2024-03-24 LAB — POCT HEMOGLOBIN-HEMACUE: Hemoglobin: 10.1 g/dL — ABNORMAL LOW (ref 12.0–15.0)

## 2024-03-24 MED ORDER — EPOETIN ALFA 20000 UNIT/ML IJ SOLN
INTRAMUSCULAR | Status: AC
  Administered 2024-03-24: 20000 [IU]
  Filled 2024-03-24: qty 1

## 2024-03-24 MED ORDER — EPOETIN ALFA 20000 UNIT/ML IJ SOLN: 20000.0000 [IU] | Freq: Once | INTRAMUSCULAR | Status: DC

## 2024-03-24 MED ORDER — EPOETIN ALFA-EPBX 10000 UNIT/ML IJ SOLN: 20000.0000 [IU] | INTRAMUSCULAR | Status: DC

## 2024-04-07 ENCOUNTER — Encounter (HOSPITAL_COMMUNITY)
Admission: RE | Admit: 2024-04-07 | Discharge: 2024-04-07 | Disposition: A | Discharge status: Home / Self Care | Source: Ambulatory Visit | Attending: Nephrology

## 2024-04-07 VITALS — BP 111/93 | HR 60 | Temp 97.3°F | Resp 17

## 2024-04-07 DIAGNOSIS — N183 Chronic kidney disease, stage 3 unspecified: Secondary | ICD-10-CM | POA: Diagnosis not present

## 2024-04-07 DIAGNOSIS — D631 Anemia in chronic kidney disease: Secondary | ICD-10-CM | POA: Diagnosis not present

## 2024-04-07 DIAGNOSIS — N189 Chronic kidney disease, unspecified: Secondary | ICD-10-CM | POA: Insufficient documentation

## 2024-04-07 DIAGNOSIS — N170 Acute kidney failure with tubular necrosis: Secondary | ICD-10-CM | POA: Insufficient documentation

## 2024-04-07 LAB — POCT HEMOGLOBIN-HEMACUE: Hemoglobin: 10.5 g/dL — ABNORMAL LOW (ref 12.0–15.0)

## 2024-04-07 LAB — CBC
HCT: 33.5 % — ABNORMAL LOW (ref 36.0–46.0)
Hemoglobin: 10.6 g/dL — ABNORMAL LOW (ref 12.0–15.0)
MCH: 32.3 pg (ref 26.0–34.0)
MCHC: 31.6 g/dL (ref 30.0–36.0)
MCV: 102.1 fL — ABNORMAL HIGH (ref 80.0–100.0)
Platelets: 126 10*3/uL — ABNORMAL LOW (ref 150–400)
RBC: 3.28 MIL/uL — ABNORMAL LOW (ref 3.87–5.11)
RDW: 14.5 % (ref 11.5–15.5)
WBC: 8.5 10*3/uL (ref 4.0–10.5)
nRBC: 0 % (ref 0.0–0.2)

## 2024-04-07 LAB — DIFFERENTIAL
Abs Immature Granulocytes: 0.02 10*3/uL (ref 0.00–0.07)
Basophils Absolute: 0 10*3/uL (ref 0.0–0.1)
Basophils Relative: 0 %
Eosinophils Absolute: 0.2 10*3/uL (ref 0.0–0.5)
Eosinophils Relative: 2 %
Immature Granulocytes: 0 %
Lymphocytes Relative: 27 %
Lymphs Abs: 2.3 10*3/uL (ref 0.7–4.0)
Monocytes Absolute: 0.7 10*3/uL (ref 0.1–1.0)
Monocytes Relative: 8 %
Neutro Abs: 5.3 10*3/uL (ref 1.7–7.7)
Neutrophils Relative %: 63 %

## 2024-04-07 LAB — MAGNESIUM: Magnesium: 1.8 mg/dL (ref 1.7–2.4)

## 2024-04-07 LAB — RENAL FUNCTION PANEL
Albumin: 3.6 g/dL (ref 3.5–5.0)
Anion gap: 8 (ref 5–15)
BUN: 93 mg/dL — ABNORMAL HIGH (ref 8–23)
CO2: 17 mmol/L — ABNORMAL LOW (ref 22–32)
Calcium: 9.1 mg/dL (ref 8.9–10.3)
Chloride: 114 mmol/L — ABNORMAL HIGH (ref 98–111)
Creatinine, Ser: 3.78 mg/dL — ABNORMAL HIGH (ref 0.44–1.00)
GFR, Estimated: 12 mL/min — ABNORMAL LOW (ref >60)
Glucose, Bld: 121 mg/dL — ABNORMAL HIGH (ref 70–99)
Phosphorus: 4.8 mg/dL — ABNORMAL HIGH (ref 2.5–4.6)
Potassium: 4.4 mmol/L (ref 3.5–5.1)
Sodium: 139 mmol/L (ref 135–145)

## 2024-04-07 LAB — IRON AND TIBC
Iron: 96 ug/dL (ref 28–170)
Saturation Ratios: 37 % — ABNORMAL HIGH (ref 10.4–31.8)
TIBC: 260 ug/dL (ref 250–450)
UIBC: 164 ug/dL

## 2024-04-07 LAB — FERRITIN: Ferritin: 221 ng/mL (ref 11–307)

## 2024-04-07 MED ORDER — EPOETIN ALFA-EPBX 10000 UNIT/ML IJ SOLN
20000.0000 [IU] | Freq: Once | INTRAMUSCULAR | Status: AC
  Administered 2024-04-07: 20000 [IU] via SUBCUTANEOUS
  Filled 2024-04-07: qty 1

## 2024-04-07 MED ORDER — EPOETIN ALFA-EPBX 10000 UNIT/ML IJ SOLN
INTRAMUSCULAR | Status: AC
Start: 2024-04-07 — End: ?
  Filled 2024-04-07: qty 2

## 2024-04-21 ENCOUNTER — Encounter (HOSPITAL_COMMUNITY)
Admission: RE | Admit: 2024-04-21 | Discharge: 2024-04-21 | Disposition: A | Discharge status: Home / Self Care | Source: Ambulatory Visit | Attending: Nephrology | Admitting: Nephrology

## 2024-04-21 VITALS — BP 150/52 | HR 68 | Temp 97.7°F | Resp 17

## 2024-04-21 DIAGNOSIS — N189 Chronic kidney disease, unspecified: Secondary | ICD-10-CM | POA: Diagnosis not present

## 2024-04-21 DIAGNOSIS — N170 Acute kidney failure with tubular necrosis: Secondary | ICD-10-CM

## 2024-04-21 DIAGNOSIS — D631 Anemia in chronic kidney disease: Secondary | ICD-10-CM | POA: Diagnosis not present

## 2024-04-21 DIAGNOSIS — N183 Chronic kidney disease, stage 3 unspecified: Secondary | ICD-10-CM | POA: Diagnosis not present

## 2024-04-21 LAB — POCT HEMOGLOBIN-HEMACUE: Hemoglobin: 11 g/dL — ABNORMAL LOW (ref 12.0–15.0)

## 2024-04-21 MED ORDER — EPOETIN ALFA-EPBX 10000 UNIT/ML IJ SOLN
INTRAMUSCULAR | Status: AC
Start: 2024-04-21 — End: ?
  Filled 2024-04-21: qty 2

## 2024-04-21 MED ORDER — EPOETIN ALFA-EPBX 10000 UNIT/ML IJ SOLN
20000.0000 [IU] | Freq: Once | INTRAMUSCULAR | Status: AC
  Administered 2024-04-21: 20000 [IU] via SUBCUTANEOUS

## 2024-05-05 ENCOUNTER — Ambulatory Visit (HOSPITAL_COMMUNITY)
Admission: RE | Admit: 2024-05-05 | Discharge: 2024-05-05 | Disposition: A | Discharge status: Home / Self Care | Source: Ambulatory Visit | Attending: Nephrology | Admitting: Nephrology

## 2024-05-05 VITALS — BP 140/50 | HR 67 | Temp 97.7°F | Resp 16

## 2024-05-05 DIAGNOSIS — N183 Chronic kidney disease, stage 3 unspecified: Secondary | ICD-10-CM | POA: Diagnosis not present

## 2024-05-05 DIAGNOSIS — N184 Chronic kidney disease, stage 4 (severe): Secondary | ICD-10-CM | POA: Diagnosis not present

## 2024-05-05 DIAGNOSIS — N189 Chronic kidney disease, unspecified: Secondary | ICD-10-CM | POA: Insufficient documentation

## 2024-05-05 DIAGNOSIS — D631 Anemia in chronic kidney disease: Secondary | ICD-10-CM | POA: Diagnosis not present

## 2024-05-05 DIAGNOSIS — N170 Acute kidney failure with tubular necrosis: Secondary | ICD-10-CM | POA: Diagnosis not present

## 2024-05-05 LAB — RENAL FUNCTION PANEL
Albumin: 3.6 g/dL (ref 3.5–5.0)
Anion gap: 9 (ref 5–15)
BUN: 94 mg/dL — ABNORMAL HIGH (ref 8–23)
CO2: 18 mmol/L — ABNORMAL LOW (ref 22–32)
Calcium: 9.1 mg/dL (ref 8.9–10.3)
Chloride: 110 mmol/L (ref 98–111)
Creatinine, Ser: 3.5 mg/dL — ABNORMAL HIGH (ref 0.44–1.00)
GFR, Estimated: 13 mL/min — ABNORMAL LOW (ref >60)
Glucose, Bld: 90 mg/dL (ref 70–99)
Phosphorus: 4.8 mg/dL — ABNORMAL HIGH (ref 2.5–4.6)
Potassium: 4.5 mmol/L (ref 3.5–5.1)
Sodium: 137 mmol/L (ref 135–145)

## 2024-05-05 LAB — CBC
HCT: 35.6 % — ABNORMAL LOW (ref 36.0–46.0)
Hemoglobin: 11.1 g/dL — ABNORMAL LOW (ref 12.0–15.0)
MCH: 32.1 pg (ref 26.0–34.0)
MCHC: 31.2 g/dL (ref 30.0–36.0)
MCV: 102.9 fL — ABNORMAL HIGH (ref 80.0–100.0)
Platelets: 128 10*3/uL — ABNORMAL LOW (ref 150–400)
RBC: 3.46 MIL/uL — ABNORMAL LOW (ref 3.87–5.11)
RDW: 13.6 % (ref 11.5–15.5)
WBC: 8.3 10*3/uL (ref 4.0–10.5)
nRBC: 0 % (ref 0.0–0.2)

## 2024-05-05 LAB — DIFFERENTIAL
Abs Immature Granulocytes: 0.04 10*3/uL (ref 0.00–0.07)
Basophils Absolute: 0.1 10*3/uL (ref 0.0–0.1)
Basophils Relative: 1 %
Eosinophils Absolute: 0.2 10*3/uL (ref 0.0–0.5)
Eosinophils Relative: 3 %
Immature Granulocytes: 1 %
Lymphocytes Relative: 27 %
Lymphs Abs: 2.2 10*3/uL (ref 0.7–4.0)
Monocytes Absolute: 0.6 10*3/uL (ref 0.1–1.0)
Monocytes Relative: 7 %
Neutro Abs: 5.2 10*3/uL (ref 1.7–7.7)
Neutrophils Relative %: 61 %

## 2024-05-05 LAB — IRON AND TIBC
Iron: 102 ug/dL (ref 28–170)
Saturation Ratios: 39 % — ABNORMAL HIGH (ref 10.4–31.8)
TIBC: 265 ug/dL (ref 250–450)
UIBC: 163 ug/dL

## 2024-05-05 LAB — FERRITIN: Ferritin: 221 ng/mL (ref 11–307)

## 2024-05-05 LAB — POCT HEMOGLOBIN-HEMACUE: Hemoglobin: 10.9 g/dL — ABNORMAL LOW (ref 12.0–15.0)

## 2024-05-05 LAB — MAGNESIUM: Magnesium: 1.8 mg/dL (ref 1.7–2.4)

## 2024-05-05 MED ORDER — EPOETIN ALFA-EPBX 10000 UNIT/ML IJ SOLN
INTRAMUSCULAR | Status: AC
Start: 2024-05-05 — End: ?
  Filled 2024-05-05: qty 2

## 2024-05-05 MED ORDER — EPOETIN ALFA-EPBX 10000 UNIT/ML IJ SOLN
20000.0000 [IU] | Freq: Once | INTRAMUSCULAR | Status: AC
  Administered 2024-05-05: 20000 [IU] via SUBCUTANEOUS
  Filled 2024-05-05: qty 2

## 2024-05-19 ENCOUNTER — Encounter (HOSPITAL_COMMUNITY)
Admission: RE | Admit: 2024-05-19 | Discharge: 2024-05-19 | Disposition: A | Discharge status: Home / Self Care | Source: Ambulatory Visit | Attending: Nephrology | Admitting: Nephrology

## 2024-05-19 VITALS — BP 166/55 | HR 63 | Temp 97.6°F | Resp 16

## 2024-05-19 DIAGNOSIS — D631 Anemia in chronic kidney disease: Secondary | ICD-10-CM | POA: Insufficient documentation

## 2024-05-19 DIAGNOSIS — N170 Acute kidney failure with tubular necrosis: Secondary | ICD-10-CM | POA: Insufficient documentation

## 2024-05-19 DIAGNOSIS — N183 Chronic kidney disease, stage 3 unspecified: Secondary | ICD-10-CM | POA: Insufficient documentation

## 2024-05-19 DIAGNOSIS — N189 Chronic kidney disease, unspecified: Secondary | ICD-10-CM | POA: Insufficient documentation

## 2024-05-19 LAB — POCT HEMOGLOBIN-HEMACUE: Hemoglobin: 11.1 g/dL — ABNORMAL LOW (ref 12.0–15.0)

## 2024-05-19 MED ORDER — EPOETIN ALFA-EPBX 10000 UNIT/ML IJ SOLN
INTRAMUSCULAR | Status: AC
  Filled 2024-05-19: qty 2

## 2024-05-19 MED ORDER — EPOETIN ALFA-EPBX 10000 UNIT/ML IJ SOLN
20000.0000 [IU] | Freq: Once | INTRAMUSCULAR | Status: AC
  Administered 2024-05-19: 20000 [IU] via SUBCUTANEOUS

## 2024-05-26 ENCOUNTER — Ambulatory Visit: Admitting: Student

## 2024-05-26 ENCOUNTER — Encounter: Payer: Self-pay | Admitting: Family Medicine

## 2024-05-26 VITALS — BP 157/52 | HR 72 | Ht 63.0 in | Wt 177.8 lb

## 2024-05-26 DIAGNOSIS — R040 Epistaxis: Secondary | ICD-10-CM | POA: Diagnosis not present

## 2024-05-26 DIAGNOSIS — E1122 Type 2 diabetes mellitus with diabetic chronic kidney disease: Secondary | ICD-10-CM

## 2024-05-26 DIAGNOSIS — N184 Chronic kidney disease, stage 4 (severe): Secondary | ICD-10-CM

## 2024-05-26 DIAGNOSIS — G629 Polyneuropathy, unspecified: Secondary | ICD-10-CM | POA: Diagnosis not present

## 2024-05-26 DIAGNOSIS — E1165 Type 2 diabetes mellitus with hyperglycemia: Secondary | ICD-10-CM

## 2024-05-26 DIAGNOSIS — N183 Chronic kidney disease, stage 3 unspecified: Secondary | ICD-10-CM

## 2024-05-26 DIAGNOSIS — Z794 Long term (current) use of insulin: Secondary | ICD-10-CM | POA: Diagnosis not present

## 2024-05-26 MED ORDER — ACCU-CHEK SOFTCLIX LANCETS MISC: 3 refills | Status: AC

## 2024-05-26 MED ORDER — FUROSEMIDE 40 MG PO TABS: 40.0000 mg | ORAL_TABLET | Freq: Every day | ORAL | 3 refills | Status: DC

## 2024-05-26 MED ORDER — INSULIN GLARGINE 100 UNIT/ML ~~LOC~~ SOLN: SUBCUTANEOUS | 3 refills | Status: DC

## 2024-05-26 MED ORDER — INSULIN SYRINGE-NEEDLE U-100 31G X 5/16" 1 ML MISC: 1.0000 | Freq: Every day | 3 refills | Status: AC

## 2024-05-26 NOTE — Assessment & Plan Note (Signed)
 Currently taking 28 units Lantus  daily.  Needs refills. - Lantus  refill, no refill - A1c/UACR ordered, Future order 2/2 lab closure

## 2024-05-26 NOTE — Progress Notes (Signed)
    SUBJECTIVE:   CHIEF COMPLAINT / HPI:   Epistaxis Reports ongoing upper taxis on and off for 1 year.  Denies trauma, denies foreign bodies.  Bleeding lasts between 5-10 minutes.  She applies direct pressure, which eventually stopped bleeding.  She has no bleeding from anywhere else.  She is on aspirin , but no blood thinners.  Bilateral hand neuropathy Worsening hand neuropathy over several months.  Currently taking gabapentin 100 mg, limited 2/2 renal function.  Numbness and tingling affects all fingers, and palmar/dorsal hand.   OBJECTIVE:   BP (!) 157/52   Pulse 72   Ht 5' 3 (1.6 m)   Wt 177 lb 12.8 oz (80.6 kg)   LMP  (LMP Unknown)   SpO2 99%   BMI 31.50 kg/m    General: NAD, pleasant HEENT: Normocephalic, atraumatic head.  Hemostatic, abrasion on anterior nasal septum of right nostril. Cardio: RRR, no MRG. Cap Refill <2s. Respiratory: CTAB, normal wob on RA Bilateral hands: No gross deformity, no, discomfort or swelling.  No TTP.  5/5 strength.  Gross sensation intact.  Phalen's/Tinel's negative. Skin: Warm and dry   ASSESSMENT/PLAN:   Assessment & Plan Neuropathy Likely diabetic neuropathy.  Will obtain other diagnostic labs. - B12 future order 2/2 level closure - Trial OTC lidocaine  and/or capsaicin cream Epistaxis Anterior septal bleed, abrasion visualized on exam.  Hemostatic today. - Check CBC, future order 2/2 lab closure today - Counseled on direct pressure Type 2 diabetes mellitus with stage 3 chronic kidney disease, with long-term current use of insulin , unspecified whether stage 3a or 3b CKD (HCC) Currently taking 28 units Lantus  daily.  Needs refills. - Lantus  refill, no refill - A1c/UACR ordered, Future order 2/2 lab closure   -Provided medication refills for her chronic medical conditions  Follow-up recommendations My team will call and schedule lab visit so we can obtain her labs. Follow-up in 4 to 6 weeks, consider an office cauterization  versus ENT referral if nosebleeds are present.  Gladis Church, DO  [facility]

## 2024-05-26 NOTE — Patient Instructions (Signed)
 It was great to see you! Thank you for allowing me to participate in your care!   I recommend that you always bring your medications to each appointment as this makes it easy to ensure we are on the correct medications and helps us  not miss when refills are needed.  Our plans for today:  - I recommend you get over-the-counter capsaicin cream to help with your neuropathy.  We are checking some labs. - You will need to come back for laboratory work.  I will have my team reach out at your earliest convenience. - Follow-up in 4 to 6 weeks  Take care and seek immediate care sooner if you develop any concerns. Please remember to show up 15 minutes before your scheduled appointment time!  Gladis Church, DO [facility]

## 2024-05-27 NOTE — Progress Notes (Signed)
 Scheduled patient for her lab appointment for tomorrow at 215 and her 4 week appt on 07/30 @1030 

## 2024-05-28 ENCOUNTER — Other Ambulatory Visit (INDEPENDENT_AMBULATORY_CARE_PROVIDER_SITE_OTHER)

## 2024-05-28 ENCOUNTER — Other Ambulatory Visit: Payer: Self-pay

## 2024-05-28 DIAGNOSIS — E1122 Type 2 diabetes mellitus with diabetic chronic kidney disease: Secondary | ICD-10-CM | POA: Diagnosis not present

## 2024-05-28 DIAGNOSIS — G629 Polyneuropathy, unspecified: Secondary | ICD-10-CM

## 2024-05-28 DIAGNOSIS — Z794 Long term (current) use of insulin: Secondary | ICD-10-CM | POA: Diagnosis not present

## 2024-05-28 DIAGNOSIS — N183 Chronic kidney disease, stage 3 unspecified: Secondary | ICD-10-CM | POA: Diagnosis not present

## 2024-05-28 LAB — POCT GLYCOSYLATED HEMOGLOBIN (HGB A1C): HbA1c, POC (controlled diabetic range): 5 % (ref 0.0–7.0)

## 2024-05-28 MED ORDER — FUROSEMIDE 40 MG PO TABS: 40.0000 mg | ORAL_TABLET | Freq: Every day | ORAL | 0 refills | Status: DC

## 2024-05-28 NOTE — Telephone Encounter (Signed)
 Patient calls nurse line regarding Lasix  refill.   She reports that she has not yet received shipment from mail order pharmacy. She is asking for a 7 day supply to be sent to local pharmacy, as she is fixing to go out of town.   Will forward request to PCP.   Chiquita JAYSON English, RN

## 2024-05-29 LAB — CBC
Hematocrit: 36.3 % (ref 34.0–46.6)
Hemoglobin: 11.4 g/dL (ref 11.1–15.9)
MCH: 32.8 pg (ref 26.6–33.0)
MCHC: 31.4 g/dL — ABNORMAL LOW (ref 31.5–35.7)
MCV: 104 fL — ABNORMAL HIGH (ref 79–97)
Platelets: 146 x10E3/uL — ABNORMAL LOW (ref 150–450)
RBC: 3.48 x10E6/uL — ABNORMAL LOW (ref 3.77–5.28)
RDW: 12.9 % (ref 11.7–15.4)
WBC: 9.5 x10E3/uL (ref 3.4–10.8)

## 2024-05-29 LAB — VITAMIN B12: Vitamin B-12: 547 pg/mL (ref 232–1245)

## 2024-05-30 LAB — MICROALBUMIN / CREATININE URINE RATIO
Creatinine, Urine: 86.5 mg/dL
Microalb/Creat Ratio: 87 mg/g{creat} — ABNORMAL HIGH (ref 0–29)
Microalbumin, Urine: 75.1 ug/mL

## 2024-06-02 ENCOUNTER — Encounter (HOSPITAL_COMMUNITY)

## 2024-06-02 ENCOUNTER — Ambulatory Visit: Payer: Self-pay | Admitting: Family Medicine

## 2024-06-03 ENCOUNTER — Encounter (HOSPITAL_COMMUNITY)
Admission: RE | Admit: 2024-06-03 | Discharge: 2024-06-03 | Disposition: A | Discharge status: Home / Self Care | Source: Ambulatory Visit | Attending: Nephrology | Admitting: Nephrology

## 2024-06-03 VITALS — BP 155/54 | HR 66 | Temp 97.1°F | Resp 16

## 2024-06-03 DIAGNOSIS — N170 Acute kidney failure with tubular necrosis: Secondary | ICD-10-CM | POA: Diagnosis not present

## 2024-06-03 DIAGNOSIS — N183 Chronic kidney disease, stage 3 unspecified: Secondary | ICD-10-CM | POA: Diagnosis not present

## 2024-06-03 DIAGNOSIS — N189 Chronic kidney disease, unspecified: Secondary | ICD-10-CM | POA: Insufficient documentation

## 2024-06-03 DIAGNOSIS — D631 Anemia in chronic kidney disease: Secondary | ICD-10-CM | POA: Insufficient documentation

## 2024-06-03 LAB — DIFFERENTIAL
Abs Immature Granulocytes: 0.02 K/uL (ref 0.00–0.07)
Basophils Absolute: 0 K/uL (ref 0.0–0.1)
Basophils Relative: 0 %
Eosinophils Absolute: 0.2 K/uL (ref 0.0–0.5)
Eosinophils Relative: 2 %
Immature Granulocytes: 0 %
Lymphocytes Relative: 25 %
Lymphs Abs: 2.2 K/uL (ref 0.7–4.0)
Monocytes Absolute: 0.7 K/uL (ref 0.1–1.0)
Monocytes Relative: 8 %
Neutro Abs: 5.7 K/uL (ref 1.7–7.7)
Neutrophils Relative %: 65 %

## 2024-06-03 LAB — RENAL FUNCTION PANEL
Albumin: 3.6 g/dL (ref 3.5–5.0)
Anion gap: 13 (ref 5–15)
BUN: 90 mg/dL — ABNORMAL HIGH (ref 8–23)
CO2: 16 mmol/L — ABNORMAL LOW (ref 22–32)
Calcium: 9.6 mg/dL (ref 8.9–10.3)
Chloride: 112 mmol/L — ABNORMAL HIGH (ref 98–111)
Creatinine, Ser: 3.05 mg/dL — ABNORMAL HIGH (ref 0.44–1.00)
GFR, Estimated: 15 mL/min — ABNORMAL LOW (ref >60)
Glucose, Bld: 100 mg/dL — ABNORMAL HIGH (ref 70–99)
Phosphorus: 4.4 mg/dL (ref 2.5–4.6)
Potassium: 4.7 mmol/L (ref 3.5–5.1)
Sodium: 141 mmol/L (ref 135–145)

## 2024-06-03 LAB — CBC
HCT: 36.5 % (ref 36.0–46.0)
Hemoglobin: 11.3 g/dL — ABNORMAL LOW (ref 12.0–15.0)
MCH: 32.3 pg (ref 26.0–34.0)
MCHC: 31 g/dL (ref 30.0–36.0)
MCV: 104.3 fL — ABNORMAL HIGH (ref 80.0–100.0)
Platelets: 131 K/uL — ABNORMAL LOW (ref 150–400)
RBC: 3.5 MIL/uL — ABNORMAL LOW (ref 3.87–5.11)
RDW: 13.4 % (ref 11.5–15.5)
WBC: 8.8 K/uL (ref 4.0–10.5)
nRBC: 0 % (ref 0.0–0.2)

## 2024-06-03 LAB — IRON AND TIBC
Iron: 101 ug/dL (ref 28–170)
Saturation Ratios: 37 % — ABNORMAL HIGH (ref 10.4–31.8)
TIBC: 273 ug/dL (ref 250–450)
UIBC: 172 ug/dL

## 2024-06-03 LAB — POCT HEMOGLOBIN-HEMACUE: Hemoglobin: 11.2 g/dL — ABNORMAL LOW (ref 12.0–15.0)

## 2024-06-03 LAB — MAGNESIUM: Magnesium: 1.7 mg/dL (ref 1.7–2.4)

## 2024-06-03 LAB — FERRITIN: Ferritin: 200 ng/mL (ref 11–307)

## 2024-06-03 MED ORDER — EPOETIN ALFA-EPBX 10000 UNIT/ML IJ SOLN
20000.0000 [IU] | Freq: Once | INTRAMUSCULAR | Status: AC
  Administered 2024-06-03: 20000 [IU] via SUBCUTANEOUS

## 2024-06-03 MED ORDER — EPOETIN ALFA-EPBX 10000 UNIT/ML IJ SOLN
INTRAMUSCULAR | Status: AC
  Filled 2024-06-03: qty 2

## 2024-06-14 ENCOUNTER — Other Ambulatory Visit: Payer: Self-pay | Admitting: Student

## 2024-06-14 DIAGNOSIS — E7800 Pure hypercholesterolemia, unspecified: Secondary | ICD-10-CM

## 2024-06-16 ENCOUNTER — Encounter (HOSPITAL_COMMUNITY)
Admission: RE | Admit: 2024-06-16 | Discharge: 2024-06-16 | Disposition: A | Discharge status: Home / Self Care | Source: Ambulatory Visit | Attending: Nephrology | Admitting: Nephrology

## 2024-06-16 VITALS — BP 157/59 | HR 64 | Temp 97.2°F | Resp 16

## 2024-06-16 DIAGNOSIS — N183 Chronic kidney disease, stage 3 unspecified: Secondary | ICD-10-CM

## 2024-06-16 DIAGNOSIS — D631 Anemia in chronic kidney disease: Secondary | ICD-10-CM | POA: Diagnosis not present

## 2024-06-16 DIAGNOSIS — N189 Chronic kidney disease, unspecified: Secondary | ICD-10-CM | POA: Diagnosis not present

## 2024-06-16 DIAGNOSIS — N170 Acute kidney failure with tubular necrosis: Secondary | ICD-10-CM | POA: Diagnosis not present

## 2024-06-16 LAB — POCT HEMOGLOBIN-HEMACUE: Hemoglobin: 11.1 g/dL — ABNORMAL LOW (ref 12.0–15.0)

## 2024-06-16 MED ORDER — EPOETIN ALFA-EPBX 10000 UNIT/ML IJ SOLN
INTRAMUSCULAR | Status: AC
  Filled 2024-06-16: qty 2

## 2024-06-16 MED ORDER — EPOETIN ALFA-EPBX 10000 UNIT/ML IJ SOLN
20000.0000 [IU] | INTRAMUSCULAR | Status: DC
  Administered 2024-06-16: 20000 [IU] via SUBCUTANEOUS
  Filled 2024-06-16: qty 2

## 2024-06-24 ENCOUNTER — Encounter: Payer: Self-pay | Admitting: Student

## 2024-06-24 ENCOUNTER — Ambulatory Visit: Admitting: Student

## 2024-06-24 VITALS — BP 139/50 | HR 74 | Ht 63.0 in | Wt 177.5 lb

## 2024-06-24 DIAGNOSIS — I152 Hypertension secondary to endocrine disorders: Secondary | ICD-10-CM

## 2024-06-24 DIAGNOSIS — E1159 Type 2 diabetes mellitus with other circulatory complications: Secondary | ICD-10-CM | POA: Diagnosis not present

## 2024-06-24 DIAGNOSIS — E1122 Type 2 diabetes mellitus with diabetic chronic kidney disease: Secondary | ICD-10-CM | POA: Diagnosis not present

## 2024-06-24 DIAGNOSIS — Z780 Asymptomatic menopausal state: Secondary | ICD-10-CM | POA: Diagnosis not present

## 2024-06-24 DIAGNOSIS — N184 Chronic kidney disease, stage 4 (severe): Secondary | ICD-10-CM

## 2024-06-24 NOTE — Assessment & Plan Note (Addendum)
 In goal today. - Continue carvedilol  25 mg twice daily - Continue hydralazine  100 mg 3 times daily

## 2024-06-24 NOTE — Assessment & Plan Note (Addendum)
 Stable renal function. - Follow-up with nephrology

## 2024-06-24 NOTE — Patient Instructions (Signed)
 It was great to see you! Thank you for allowing me to participate in your care!   I recommend that you always bring your medications to each appointment as this makes it easy to ensure we are on the correct medications and helps us  not miss when refills are needed.  Our plans for today:  - Follow-up in January 2026 for annual exam - Please follow-up with Kidney doctor as soon as you are able to  I recommend a bone density test. I ordered this test. This is to test the strength of your bones. It is like an x-ray. You need to call to schedule [phone]. This is located at [address] [facility] [address]---the same place mammograms are completed.    Take care and seek immediate care sooner if you develop any concerns. Please remember to show up 15 minutes before your scheduled appointment time!  Gladis Church, DO [facility]

## 2024-06-24 NOTE — Progress Notes (Signed)
    SUBJECTIVE:   CHIEF COMPLAINT / HPI:   CKD 5  HTN  epistaxis Patient presents for follow-up of her epistaxis.  This is now resolved.  If it recurs significantly, we do plan to refer to ENT. Checked UACR for type 2 diabetes with kidney disease, she does have mild proteinuria which is expected.  She is followed closely by nephrology.  You also help manage her blood pressure.  She reports adherence to blood pressure medications without side effects.  Blood pressure mildly elevated today. She is otherwise asymptomatic, doing well.  Health maintenance Discussed recommendation for pneumococcal/shingles vaccine, patient declines.  Patient is due for tetanus, she will to get at the pharmacy.  Patient is agreeable to DEXA scan today.  OBJECTIVE:   BP (!) 139/50   Pulse 74   Ht 5' 3 (1.6 m)   Wt 177 lb 8 oz (80.5 kg)   LMP  (LMP Unknown)   SpO2 98%   BMI 31.44 kg/m    General: NAD, pleasant, Cardio: RRR, no MRG. Cap Refill <2s. Respiratory: CTAB, normal wob on RA Skin: Warm and dry  ASSESSMENT/PLAN:   Assessment & Plan Postmenopausal estrogen deficiency - DEXA CKD stage 4 due to type 2 diabetes mellitus (HCC) Stable renal function. - Follow-up with nephrology Hypertension associated with diabetes (HCC) In goal today. - Continue carvedilol  25 mg twice daily - Continue hydralazine  100 mg 3 times daily   Gladis Church, DO [facility]

## 2024-06-30 ENCOUNTER — Ambulatory Visit (HOSPITAL_COMMUNITY)
Admission: RE | Admit: 2024-06-30 | Discharge: 2024-06-30 | Disposition: A | Discharge status: Home / Self Care | Source: Ambulatory Visit | Attending: Nephrology | Admitting: Nephrology

## 2024-06-30 VITALS — BP 157/58 | HR 67 | Temp 97.4°F

## 2024-06-30 DIAGNOSIS — D631 Anemia in chronic kidney disease: Secondary | ICD-10-CM | POA: Insufficient documentation

## 2024-06-30 DIAGNOSIS — N170 Acute kidney failure with tubular necrosis: Secondary | ICD-10-CM | POA: Insufficient documentation

## 2024-06-30 DIAGNOSIS — N184 Chronic kidney disease, stage 4 (severe): Secondary | ICD-10-CM | POA: Diagnosis not present

## 2024-06-30 DIAGNOSIS — E1122 Type 2 diabetes mellitus with diabetic chronic kidney disease: Secondary | ICD-10-CM | POA: Insufficient documentation

## 2024-06-30 DIAGNOSIS — N189 Chronic kidney disease, unspecified: Secondary | ICD-10-CM | POA: Insufficient documentation

## 2024-06-30 DIAGNOSIS — N183 Chronic kidney disease, stage 3 unspecified: Secondary | ICD-10-CM | POA: Diagnosis not present

## 2024-06-30 LAB — RENAL FUNCTION PANEL
Albumin: 3.6 g/dL (ref 3.5–5.0)
Anion gap: 9 (ref 5–15)
BUN: 81 mg/dL — ABNORMAL HIGH (ref 8–23)
CO2: 18 mmol/L — ABNORMAL LOW (ref 22–32)
Calcium: 9.4 mg/dL (ref 8.9–10.3)
Chloride: 113 mmol/L — ABNORMAL HIGH (ref 98–111)
Creatinine, Ser: 3.25 mg/dL — ABNORMAL HIGH (ref 0.44–1.00)
GFR, Estimated: 14 mL/min — ABNORMAL LOW (ref >60)
Glucose, Bld: 84 mg/dL (ref 70–99)
Phosphorus: 4.2 mg/dL (ref 2.5–4.6)
Potassium: 4.7 mmol/L (ref 3.5–5.1)
Sodium: 140 mmol/L (ref 135–145)

## 2024-06-30 LAB — IRON AND TIBC
Iron: 103 ug/dL (ref 28–170)
Saturation Ratios: 39 % — ABNORMAL HIGH (ref 10.4–31.8)
TIBC: 262 ug/dL (ref 250–450)
UIBC: 159 ug/dL

## 2024-06-30 LAB — CBC
HCT: 36.5 % (ref 36.0–46.0)
Hemoglobin: 11.1 g/dL — ABNORMAL LOW (ref 12.0–15.0)
MCH: 31.4 pg (ref 26.0–34.0)
MCHC: 30.4 g/dL (ref 30.0–36.0)
MCV: 103.4 fL — ABNORMAL HIGH (ref 80.0–100.0)
Platelets: 131 K/uL — ABNORMAL LOW (ref 150–400)
RBC: 3.53 MIL/uL — ABNORMAL LOW (ref 3.87–5.11)
RDW: 13.6 % (ref 11.5–15.5)
WBC: 8.6 K/uL (ref 4.0–10.5)
nRBC: 0 % (ref 0.0–0.2)

## 2024-06-30 LAB — MAGNESIUM: Magnesium: 1.6 mg/dL — ABNORMAL LOW (ref 1.7–2.4)

## 2024-06-30 LAB — POCT HEMOGLOBIN-HEMACUE: Hemoglobin: 11.3 g/dL — ABNORMAL LOW (ref 12.0–15.0)

## 2024-06-30 LAB — DIFFERENTIAL
Abs Immature Granulocytes: 0.03 K/uL (ref 0.00–0.07)
Basophils Absolute: 0.1 K/uL (ref 0.0–0.1)
Basophils Relative: 1 %
Eosinophils Absolute: 0.3 K/uL (ref 0.0–0.5)
Eosinophils Relative: 3 %
Immature Granulocytes: 0 %
Lymphocytes Relative: 24 %
Lymphs Abs: 2 K/uL (ref 0.7–4.0)
Monocytes Absolute: 0.6 K/uL (ref 0.1–1.0)
Monocytes Relative: 7 %
Neutro Abs: 5.6 K/uL (ref 1.7–7.7)
Neutrophils Relative %: 65 %

## 2024-06-30 LAB — FERRITIN: Ferritin: 162 ng/mL (ref 11–307)

## 2024-06-30 MED ORDER — EPOETIN ALFA-EPBX 10000 UNIT/ML IJ SOLN
INTRAMUSCULAR | Status: AC
  Filled 2024-06-30: qty 2

## 2024-06-30 MED ORDER — EPOETIN ALFA-EPBX 10000 UNIT/ML IJ SOLN
20000.0000 [IU] | INTRAMUSCULAR | Status: DC
  Administered 2024-06-30: 20000 [IU] via SUBCUTANEOUS
  Filled 2024-06-30: qty 1

## 2024-07-06 ENCOUNTER — Ambulatory Visit: Admitting: Student

## 2024-07-06 ENCOUNTER — Encounter: Payer: Self-pay | Admitting: Student

## 2024-07-06 VITALS — BP 140/59 | HR 72 | Ht 63.0 in | Wt 179.0 lb

## 2024-07-06 DIAGNOSIS — J340 Abscess, furuncle and carbuncle of nose: Secondary | ICD-10-CM

## 2024-07-06 NOTE — Progress Notes (Signed)
    SUBJECTIVE:   CHIEF COMPLAINT / HPI:   Epistaxis  nasal ulcer Patient presents for follow-up of her epistaxis.  Fortunately, she has had no recurrence of her epistaxis since she was seen on 05/26/2024.  She is concerned that her nose feels more  crusty and was  burning last night.  Aside from the burning last night, she is not having pain.  She denies cocaine/other drug use, use of nasal spray, picking nose.  She is in her otherwise usual state of health.   OBJECTIVE:   BP (!) 140/59   Pulse 72   Ht 5' 3 (1.6 m)   Wt 179 lb (81.2 kg)   LMP  (LMP Unknown)   SpO2 98%   BMI 31.71 kg/m    General: NAD, pleasant HEENT: Normocephalic, atraumatic head.  Normal external nose. Presence of circumferential, nonbleeding, poorly vascularized ulcers on bilateral nasal septum that appear symmetric. Of note, during her last visit for epistaxis ulcer was noted to be present in the right, however this ulcer on the left is newly seen.  Additionally, the ulcer on the right nostril does appear to be enlarged from prior, however there was overlying scab which may have skewed vision.  Images below. Cardio: RRR, no MRG. Respiratory: CTAB, normal wob on RA Skin: Warm and dry       ASSESSMENT/PLAN:   Assessment & Plan Nasal septum ulceration Potentially progressing nonpainful bilateral circumferential ulcers on nasal septum, patient denying behaviors to explain self-inflicted ulceration. -Referral to ENT, question if biopsy may be of benefit   Gladis Church, DO [facility]

## 2024-07-06 NOTE — Patient Instructions (Addendum)
 It was great to see you! Thank you for allowing me to participate in your care!   I recommend that you always bring your medications to each appointment as this makes it easy to ensure we are on the correct medications and helps us  not miss when refills are needed.  Our plans for today:  - I have sent a referral to ENT, they will call you.  Take care and seek immediate care sooner if you develop any concerns. Please remember to show up 15 minutes before your scheduled appointment time!  Kimberly Church, DO [facility]

## 2024-07-10 ENCOUNTER — Encounter (INDEPENDENT_AMBULATORY_CARE_PROVIDER_SITE_OTHER): Payer: Self-pay

## 2024-07-14 ENCOUNTER — Encounter (HOSPITAL_COMMUNITY)
Admission: RE | Admit: 2024-07-14 | Discharge: 2024-07-14 | Disposition: A | Discharge status: Home / Self Care | Source: Ambulatory Visit | Attending: Nephrology | Admitting: Nephrology

## 2024-07-14 VITALS — BP 149/52 | HR 68 | Temp 97.0°F | Resp 17

## 2024-07-14 DIAGNOSIS — N183 Chronic kidney disease, stage 3 unspecified: Secondary | ICD-10-CM | POA: Insufficient documentation

## 2024-07-14 DIAGNOSIS — N189 Chronic kidney disease, unspecified: Secondary | ICD-10-CM | POA: Insufficient documentation

## 2024-07-14 DIAGNOSIS — N170 Acute kidney failure with tubular necrosis: Secondary | ICD-10-CM | POA: Insufficient documentation

## 2024-07-14 DIAGNOSIS — D631 Anemia in chronic kidney disease: Secondary | ICD-10-CM | POA: Diagnosis not present

## 2024-07-14 LAB — POCT HEMOGLOBIN-HEMACUE: Hemoglobin: 11.2 g/dL — ABNORMAL LOW (ref 12.0–15.0)

## 2024-07-14 MED ORDER — EPOETIN ALFA-EPBX 10000 UNIT/ML IJ SOLN
INTRAMUSCULAR | Status: AC
  Filled 2024-07-14: qty 2

## 2024-07-14 MED ORDER — EPOETIN ALFA-EPBX 10000 UNIT/ML IJ SOLN
20000.0000 [IU] | INTRAMUSCULAR | Status: DC
  Administered 2024-07-14: 20000 [IU] via SUBCUTANEOUS

## 2024-07-21 DIAGNOSIS — I120 Hypertensive chronic kidney disease with stage 5 chronic kidney disease or end stage renal disease: Secondary | ICD-10-CM | POA: Diagnosis not present

## 2024-07-21 DIAGNOSIS — D631 Anemia in chronic kidney disease: Secondary | ICD-10-CM | POA: Diagnosis not present

## 2024-07-21 DIAGNOSIS — N2581 Secondary hyperparathyroidism of renal origin: Secondary | ICD-10-CM | POA: Diagnosis not present

## 2024-07-21 DIAGNOSIS — N185 Chronic kidney disease, stage 5: Secondary | ICD-10-CM | POA: Diagnosis not present

## 2024-07-28 ENCOUNTER — Encounter (HOSPITAL_COMMUNITY)

## 2024-07-28 ENCOUNTER — Encounter (INDEPENDENT_AMBULATORY_CARE_PROVIDER_SITE_OTHER): Payer: Self-pay

## 2024-08-11 ENCOUNTER — Encounter (HOSPITAL_COMMUNITY)

## 2024-09-23 DIAGNOSIS — N185 Chronic kidney disease, stage 5: Secondary | ICD-10-CM | POA: Diagnosis not present

## 2024-09-23 DIAGNOSIS — I129 Hypertensive chronic kidney disease with stage 1 through stage 4 chronic kidney disease, or unspecified chronic kidney disease: Secondary | ICD-10-CM | POA: Diagnosis not present

## 2024-09-23 DIAGNOSIS — D631 Anemia in chronic kidney disease: Secondary | ICD-10-CM | POA: Diagnosis not present

## 2024-09-23 DIAGNOSIS — N184 Chronic kidney disease, stage 4 (severe): Secondary | ICD-10-CM | POA: Diagnosis not present

## 2024-09-23 DIAGNOSIS — N2581 Secondary hyperparathyroidism of renal origin: Secondary | ICD-10-CM | POA: Diagnosis not present

## 2024-09-23 DIAGNOSIS — I120 Hypertensive chronic kidney disease with stage 5 chronic kidney disease or end stage renal disease: Secondary | ICD-10-CM | POA: Diagnosis not present

## 2024-09-24 ENCOUNTER — Ambulatory Visit (INDEPENDENT_AMBULATORY_CARE_PROVIDER_SITE_OTHER): Admitting: Otolaryngology

## 2024-09-24 ENCOUNTER — Encounter (INDEPENDENT_AMBULATORY_CARE_PROVIDER_SITE_OTHER): Payer: Self-pay | Admitting: Otolaryngology

## 2024-09-24 VITALS — BP 107/58 | HR 81 | Ht 63.0 in | Wt 177.0 lb

## 2024-09-24 DIAGNOSIS — R040 Epistaxis: Secondary | ICD-10-CM

## 2024-09-24 LAB — LAB REPORT - SCANNED
EGFR: 11
PTH: 109

## 2024-09-24 MED ORDER — MUPIROCIN 2 % EX OINT: 1.0000 | TOPICAL_OINTMENT | Freq: Two times a day (BID) | CUTANEOUS | 0 refills | Status: AC

## 2024-09-24 NOTE — Progress Notes (Signed)
 Dear Dr. Donzetta, Here is my assessment for our mutual patient, Komal Stangelo. Thank you for allowing me the opportunity to care for your patient. Please do not hesitate to contact me should you have any other questions. Sincerely, Dr. Eldora Blanch  Otolaryngology Clinic Note  HISTORY:  Initial visit (08/2024): Discussed the use of AI scribe software for clinical note transcription with the patient, who gave verbal consent to proceed.  History of Present Illness Kimberly Carlson is an 78 year old female with diabetes, hypertension, and kidney disease who presents with recurrent right-sided nasal bleeding.  She experiences recurrent right-sided epistaxis for approximately one year, with episodes occurring randomly and lasting about ten minutes. Each episode results in approximately half a cup of blood loss or less. She manages the bleeding by leaning forward and pinching her nose, occasionally blowing her nose to clear clots. Last episode was two days ago.  No history of nasal trauma, procedures, or sinus infections. She does not use nasal sprays but applies Vaseline when her nose becomes crusty, which she finds helpful. She takes a baby aspirin . No nasal congestion, or h/o sinusitis. Currently not using any nasal meds. No allergies or frequent nose blowing due to fear of triggering a bleed.  Anticoagulation/AP: yes  AP/AC: ASA 81  Tobacco: former, quit   PMHx: HTN, T2DM, CKD, HLD,   RADIOGRAPHIC EVALUATION AND INDEPENDENT REVIEW OF OTHER RECORDS:: Dr. Howell (07/06/2024): noted epistaxis, but no recurrence; nose feels crusty, burning; no pain, no picking, no intranasal drug use; Dx: Nasal septal ulceration; Rx: ref to ENT Dr. Howell 06/24/2024: epistaxis resolved; Notes 05/26/2024: on and off epistaxis 1 year, no trauma, stops with pressure; on ASA Labs CBC w/diff 06/30/2024, RFP 06/03/2024: WBC 8.6, Hgb 11.1, Plt 131; BUN/Cr 81/3.25 [facility] 11/14/2022 independently interpreted with respect  to nasal cavity: right hypoplastic frontal; septum without obvious masses; otherwise no significant paranasal sinus disease Past Medical History:  Diagnosis Date   Acute cystitis without hematuria 04/07/2020   Back pain    herniated disc lower back   Cocaine use 06/08/2015   UDS 2013     Diabetes mellitus without complication (HCC)    Frequent nosebleeds 09/15/2021   Hematuria, undiagnosed cause 06/09/2015   HERNIATED LUMBAR DISC 05/14/2007   Qualifier: Diagnosis of By: Rosalynn MD, Sara   MRI on 05/05/2007: small soft disk herniation L2-3. Slight mass effect left L2 nerve root. L5-S1 arthritis with narrowing of the Right lateral recess.      Hypertension    Vertigo 07/16/2019   Past Surgical History:  Procedure Laterality Date   ABDOMINAL HYSTERECTOMY     Family History  Adopted: Yes   Social History   Tobacco Use   Smoking status: Former    Current packs/day: 0.00    Average packs/day: 1 pack/day for 1.2 years (1.2 ttl pk-yrs)    Types: Cigarettes    Start date: 04/26/2014    Quit date: 07/23/2015    Years since quitting: 9.1   Smokeless tobacco: Never   Tobacco comments:    Started in her 15's with multiple quit attempts for about 1 year at a time.  Substance Use Topics   Alcohol use: No   Allergies  Allergen Reactions   Amlodipine  Swelling    Bilateral leg swelling with multiple doses including 2.5mg  daily.  Stopped therapy and edema improved.    Augmentin  [Amoxicillin -Pot Clavulanate] Diarrhea   Clonidine  Derivatives Other (See Comments)    Light-headedness, dizzy, extreme dry mouth   Doxycycline  Nausea  And Vomiting   Metformin Diarrhea   Current Outpatient Medications  Medication Sig Dispense Refill   ACCU-CHEK GUIDE TEST test strip USE TO CHECK BLOOD SUGAR SIX TIMES PER DAY 550 strip 3   Accu-Chek Softclix Lancets lancets USE TO CHECK BLOOD SUGAR 6 TIMES PER DAY 600 each 3   acetaminophen  (TYLENOL ) 500 MG tablet Take 1,000 mg by mouth every 6 (six) hours as needed  (for headaches).     aspirin  81 MG chewable tablet Chew 81 mg by mouth daily.     atorvastatin  (LIPITOR) 40 MG tablet TAKE 1 TABLET EVERY DAY 90 tablet 3   Blood Glucose Monitoring Suppl (ACCU-CHEK GUIDE ME) w/Device KIT Use to check blood sugar 6x per day. E11.65 1 kit 0   calcium -vitamin D  (OSCAL 500/200 D-3) 500-200 MG-UNIT tablet Take 2 tablets by mouth daily with breakfast. (Patient taking differently: Take 1 tablet by mouth daily with breakfast.) 180 tablet 3   carvedilol  (COREG ) 6.25 MG tablet Take 4 tablets (25 mg total) by mouth 2 (two) times daily. 180 tablet 3   furosemide  (LASIX ) 40 MG tablet Take 1 tablet (40 mg total) by mouth daily. 7 tablet 0   gabapentin (NEURONTIN) 100 MG capsule Take 100 mg by mouth at bedtime.     hydrALAZINE  (APRESOLINE ) 50 MG tablet Take 2 tablets (100 mg total) by mouth 3 (three) times daily.     insulin  glargine (LANTUS ) 100 UNIT/ML injection INJECT 28 UNITS UNDER THE SKIN EVERY DAY (DISCARD VIAL 28 DAYS AFTER OPENING) NEED MD APPOINTMENT FOR REFILLS 30 mL 3   Insulin  Syringe-Needle U-100 (DROPLET INSULIN  SYRINGE) 31G X 5/16 1 ML MISC Inject 1 Lancet into the skin daily. 100 each 3   mupirocin ointment (BACTROBAN) 2 % Apply 1 Application topically 2 (two) times daily for 7 days. 22 g 0   No current facility-administered medications for this visit.   BP (!) 107/58 (BP Location: Left Arm, Patient Position: Sitting, Cuff Size: Large)   Pulse 81   Ht 5' 3 (1.6 m)   Wt 177 lb (80.3 kg)   LMP  (LMP Unknown)   SpO2 93%   BMI 31.35 kg/m   PHYSICAL EXAM:  BP (!) 107/58 (BP Location: Left Arm, Patient Position: Sitting, Cuff Size: Large)   Pulse 81   Ht 5' 3 (1.6 m)   Wt 177 lb (80.3 kg)   LMP  (LMP Unknown)   SpO2 93%   BMI 31.35 kg/m    Salient findings:  CN II-XII intact Bilateral EAC clear and TM intact with well pneumatized middle ear spaces Nose: Anterior rhinoscopy reveals crusting right caudal septum, with prominent vessel, no ulcers  appreciated; mild dryness.  Nasal endoscopy was indicated to better evaluate the nose and paranasal sinuses, given the patient's history and exam findings, and is detailed below. No lesions of oral cavity/oropharynx; no vascular lesions No obviously palpable neck masses/lymphadenopathy/thyromegaly No respiratory distress or stridor   PROCEDURE:  Prior to initiating any procedures, risks/benefits/alternatives were explained to the patient and verbal consent obtained. Diagnostic Nasal Endoscopy Pre-procedure diagnosis: Concern for epistaxis Post-procedure diagnosis: same Indication: See pre-procedure diagnosis and physical exam above Complications: None apparent EBL: 0 mL Anesthesia: Lidocaine  4% and topical decongestant was topically sprayed in each nasal cavity  Description of Procedure:  Patient was identified. A rigid 30 degree endoscope was utilized to evaluate the sinonasal cavities, mucosa, sinus ostia and turbinates and septum.  Overall, signs of mucosal inflammation are not noted.  Also noted are prominent  septal vessel right; no masses. No mucopurulence, polyps, or masses noted.   Right Middle meatus: clear Right SE Recess: clear Left MM: clear Left SE Recess: clear  CPT CODE -- 31231 - Mod 25   ASSESSMENT:  78 y.o. with:  1. Epistaxis    Right sided, uses ASA 81. Likely from caudal septum right.   PLAN: We've discussed issues and options today including humidification and cautery.  We reviewed the nasal endoscopy images together.  The risks, benefits and alternatives were discussed and questions answered.  She has elected to proceed with:  1) Will try humidification first --- mupirocin BID x7d, then saline spray v/s ponaris oil multiple times 2) Use humidifier in bedroom; humidification measures discussed D/w pt f/u, she will call if bleeding persists and we can consider cautery then if she wishes  See below regarding exact medications prescribed this encounter  including dosages and route: Meds ordered this encounter  Medications   mupirocin ointment (BACTROBAN) 2 %    Sig: Apply 1 Application topically 2 (two) times daily for 7 days.    Dispense:  22 g    Refill:  0     Thank you for allowing me the opportunity to care for your patient. Please do not hesitate to contact me should you have any other questions.  Sincerely, Eldora Blanch, MD Otolaryngologist (ENT), [facility] Phone: [phone] Fax: [phone]  MDM:  Level 4: [phone] Complexity/Problems addressed: low Data complexity: mod - independent interpretation of CT; review of notes, labs - Morbidity: mod  - Drug prescribed or managed: y  09/24/2024, 10:40 AM

## 2024-09-24 NOTE — Patient Instructions (Signed)
 Mupirocin ointment: Apply a pea sized amount twice daily just to inside of each nostril using your pinkie finger for 7 days (apply to the nostril part, not the middle part), then pinch your nose for 10 seconds, then stop  Run a humidifier in bedroom at night.   After that, You can use baby oil or ponaris oil in the nose 2-3 times per day (especially at night) --- put 4 drops in each nostril

## 2024-10-02 ENCOUNTER — Other Ambulatory Visit (HOSPITAL_COMMUNITY): Payer: Self-pay | Admitting: Nephrology

## 2024-10-02 DIAGNOSIS — D631 Anemia in chronic kidney disease: Secondary | ICD-10-CM | POA: Insufficient documentation

## 2024-10-05 ENCOUNTER — Telehealth (HOSPITAL_COMMUNITY): Payer: Self-pay | Admitting: Pharmacy Technician

## 2024-10-05 NOTE — Telephone Encounter (Signed)
 Auth Submission: {approved/denied:25392} {Site of care:29250} Payer: *** Medication & CPT/J Code(s) submitted: {CHINFMEDLIST:25391} Diagnosis Code:  Route of submission (phone, fax, portal): *** Phone # Fax # Auth type: {type of PA:25519} Units/visits requested: *** Reference number: *** Approval from: *** to ***    Dagoberto Armour, CPhT [facility] Phone: [phone] 10/05/2024

## 2024-10-07 DIAGNOSIS — H3581 Retinal edema: Secondary | ICD-10-CM | POA: Diagnosis not present

## 2024-10-07 DIAGNOSIS — H26492 Other secondary cataract, left eye: Secondary | ICD-10-CM | POA: Diagnosis not present

## 2024-10-07 DIAGNOSIS — H43813 Vitreous degeneration, bilateral: Secondary | ICD-10-CM | POA: Diagnosis not present

## 2024-10-07 DIAGNOSIS — H35033 Hypertensive retinopathy, bilateral: Secondary | ICD-10-CM | POA: Diagnosis not present

## 2024-10-07 DIAGNOSIS — Z961 Presence of intraocular lens: Secondary | ICD-10-CM | POA: Diagnosis not present

## 2024-10-07 DIAGNOSIS — E119 Type 2 diabetes mellitus without complications: Secondary | ICD-10-CM | POA: Diagnosis not present

## 2024-10-07 LAB — OPHTHALMOLOGY REPORT-SCANNED

## 2024-10-20 ENCOUNTER — Ambulatory Visit (HOSPITAL_COMMUNITY)
Admission: RE | Admit: 2024-10-20 | Discharge: 2024-10-20 | Disposition: A | Discharge status: Home / Self Care | Source: Ambulatory Visit | Attending: Nephrology | Admitting: Nephrology

## 2024-10-20 VITALS — BP 126/39 | HR 70 | Temp 97.8°F | Resp 16

## 2024-10-20 DIAGNOSIS — N185 Chronic kidney disease, stage 5: Secondary | ICD-10-CM | POA: Insufficient documentation

## 2024-10-20 DIAGNOSIS — D631 Anemia in chronic kidney disease: Secondary | ICD-10-CM | POA: Insufficient documentation

## 2024-10-20 LAB — POCT HEMOGLOBIN-HEMACUE: Hemoglobin: 6 g/dL — CL (ref 12.0–15.0)

## 2024-10-20 MED ORDER — EPOETIN ALFA-EPBX 10000 UNIT/ML IJ SOLN
INTRAMUSCULAR | Status: AC
  Filled 2024-10-20: qty 1

## 2024-10-20 MED ORDER — EPOETIN ALFA-EPBX 10000 UNIT/ML IJ SOLN
10000.0000 [IU] | Freq: Once | INTRAMUSCULAR | Status: AC
  Administered 2024-10-20: 10000 [IU] via SUBCUTANEOUS

## 2024-10-20 NOTE — Progress Notes (Signed)
 Spoke to [facility] about HGB 6.0 today. Patient denies any bleeding or SOB but is tired. Amber spoke to Dr .Tobie and since not having any real symptoms and patient just restarting injections he stated we could just give injection and instruct patient to get to ED if starts feeling worse or symptomatic. Instructed patient to go to ED or call 911 if feels worse, sees blood, Sob, chest pain etc.

## 2024-10-25 ENCOUNTER — Encounter (HOSPITAL_COMMUNITY): Payer: Self-pay | Admitting: *Deleted

## 2024-10-25 ENCOUNTER — Observation Stay (HOSPITAL_COMMUNITY): Admission: EM | Admit: 2024-10-25 | Discharge: 2024-10-30 | DRG: 674 | Disposition: A

## 2024-10-25 ENCOUNTER — Other Ambulatory Visit: Payer: Self-pay

## 2024-10-25 DIAGNOSIS — R29898 Other symptoms and signs involving the musculoskeletal system: Secondary | ICD-10-CM | POA: Insufficient documentation

## 2024-10-25 DIAGNOSIS — D649 Anemia, unspecified: Secondary | ICD-10-CM | POA: Diagnosis not present

## 2024-10-25 DIAGNOSIS — Z992 Dependence on renal dialysis: Secondary | ICD-10-CM

## 2024-10-25 DIAGNOSIS — N179 Acute kidney failure, unspecified: Secondary | ICD-10-CM | POA: Diagnosis not present

## 2024-10-25 DIAGNOSIS — I10 Essential (primary) hypertension: Secondary | ICD-10-CM | POA: Diagnosis not present

## 2024-10-25 DIAGNOSIS — N184 Chronic kidney disease, stage 4 (severe): Secondary | ICD-10-CM | POA: Diagnosis present

## 2024-10-25 DIAGNOSIS — N19 Unspecified kidney failure: Secondary | ICD-10-CM

## 2024-10-25 DIAGNOSIS — N186 End stage renal disease: Secondary | ICD-10-CM

## 2024-10-25 DIAGNOSIS — Z789 Other specified health status: Secondary | ICD-10-CM

## 2024-10-25 DIAGNOSIS — M79606 Pain in leg, unspecified: Secondary | ICD-10-CM | POA: Insufficient documentation

## 2024-10-25 DIAGNOSIS — E1165 Type 2 diabetes mellitus with hyperglycemia: Secondary | ICD-10-CM

## 2024-10-25 LAB — LIPASE, BLOOD: Lipase: 53 U/L — ABNORMAL HIGH (ref 11–51)

## 2024-10-25 LAB — IRON AND TIBC
Iron: 45 ug/dL (ref 28–170)
Saturation Ratios: 14 % (ref 10.4–31.8)
TIBC: 315 ug/dL (ref 250–450)
UIBC: 270 ug/dL

## 2024-10-25 LAB — COMPREHENSIVE METABOLIC PANEL WITH GFR
ALT: 15 U/L (ref 0–44)
AST: 19 U/L (ref 15–41)
Albumin: 3.8 g/dL (ref 3.5–5.0)
Alkaline Phosphatase: 68 U/L (ref 38–126)
Anion gap: 15 (ref 5–15)
BUN: 164 mg/dL — ABNORMAL HIGH (ref 8–23)
CO2: 18 mmol/L — ABNORMAL LOW (ref 22–32)
Calcium: 9.5 mg/dL (ref 8.9–10.3)
Chloride: 104 mmol/L (ref 98–111)
Creatinine, Ser: 5.09 mg/dL — ABNORMAL HIGH (ref 0.44–1.00)
GFR, Estimated: 8 mL/min — ABNORMAL LOW (ref >60)
Glucose, Bld: 114 mg/dL — ABNORMAL HIGH (ref 70–99)
Potassium: 4.1 mmol/L (ref 3.5–5.1)
Sodium: 137 mmol/L (ref 135–145)
Total Bilirubin: 0.5 mg/dL (ref 0.0–1.2)
Total Protein: 7 g/dL (ref 6.5–8.1)

## 2024-10-25 LAB — POC OCCULT BLOOD, ED: Fecal Occult Bld: NEGATIVE

## 2024-10-25 LAB — FERRITIN: Ferritin: 546 ng/mL — ABNORMAL HIGH (ref 11–307)

## 2024-10-25 LAB — CBC
HCT: 20.5 % — ABNORMAL LOW (ref 36.0–46.0)
Hemoglobin: 6.4 g/dL — CL (ref 12.0–15.0)
MCH: 31.5 pg (ref 26.0–34.0)
MCHC: 31.2 g/dL (ref 30.0–36.0)
MCV: 101 fL — ABNORMAL HIGH (ref 80.0–100.0)
Platelets: 117 K/uL — ABNORMAL LOW (ref 150–400)
RBC: 2.03 MIL/uL — ABNORMAL LOW (ref 3.87–5.11)
RDW: 12.8 % (ref 11.5–15.5)
WBC: 10.8 K/uL — ABNORMAL HIGH (ref 4.0–10.5)
nRBC: 0 % (ref 0.0–0.2)

## 2024-10-25 LAB — NA AND K (SODIUM & POTASSIUM), RAND UR
Potassium Urine: 11 mmol/L
Sodium, Ur: 98 mmol/L

## 2024-10-25 LAB — URINALYSIS, ROUTINE W REFLEX MICROSCOPIC
Bilirubin Urine: NEGATIVE
Glucose, UA: NEGATIVE mg/dL
Hgb urine dipstick: NEGATIVE
Ketones, ur: NEGATIVE mg/dL
Nitrite: NEGATIVE
Protein, ur: NEGATIVE mg/dL
Specific Gravity, Urine: 1.01 (ref 1.005–1.030)
pH: 6 (ref 5.0–8.0)

## 2024-10-25 LAB — URINALYSIS, MICROSCOPIC (REFLEX)

## 2024-10-25 LAB — RETICULOCYTES
Immature Retic Fract: 27.4 % — ABNORMAL HIGH (ref 2.3–15.9)
RBC.: 2 MIL/uL — ABNORMAL LOW (ref 3.87–5.11)
Retic Count, Absolute: 100.4 K/uL (ref 19.0–186.0)
Retic Ct Pct: 5 % — ABNORMAL HIGH (ref 0.4–3.1)

## 2024-10-25 LAB — SAMPLE TO BLOOD BANK

## 2024-10-25 LAB — CREATININE, URINE, RANDOM: Creatinine, Urine: 37 mg/dL

## 2024-10-25 LAB — PREPARE RBC (CROSSMATCH)

## 2024-10-25 LAB — ABO/RH: ABO/RH(D): O POS

## 2024-10-25 LAB — FOLATE: Folate: 11.2 ng/mL (ref >5.9)

## 2024-10-25 LAB — VITAMIN B12: Vitamin B-12: 461 pg/mL (ref 180–914)

## 2024-10-25 MED ORDER — HYDRALAZINE HCL 50 MG PO TABS
100.0000 mg | ORAL_TABLET | Freq: Two times a day (BID) | ORAL | Status: DC
  Administered 2024-10-26: 100 mg via ORAL
  Filled 2024-10-25: qty 2

## 2024-10-25 MED ORDER — PANTOPRAZOLE SODIUM 40 MG IV SOLR
40.0000 mg | Freq: Two times a day (BID) | INTRAVENOUS | Status: DC
  Administered 2024-10-25 – 2024-10-27 (×4): 40 mg via INTRAVENOUS
  Filled 2024-10-25 (×4): qty 10

## 2024-10-25 MED ORDER — CARVEDILOL 25 MG PO TABS
25.0000 mg | ORAL_TABLET | Freq: Two times a day (BID) | ORAL | Status: DC
  Administered 2024-10-26 – 2024-10-29 (×4): 25 mg via ORAL
  Filled 2024-10-25 (×5): qty 1

## 2024-10-25 MED ORDER — DOXAZOSIN MESYLATE 4 MG PO TABS: 6.0000 mg | ORAL_TABLET | Freq: Every day | ORAL | Status: DC

## 2024-10-25 MED ORDER — SODIUM CHLORIDE 0.9% IV SOLUTION: Freq: Once | INTRAVENOUS | Status: AC

## 2024-10-25 MED ORDER — ACETAMINOPHEN 325 MG PO TABS
650.0000 mg | ORAL_TABLET | Freq: Four times a day (QID) | ORAL | Status: DC | PRN
  Administered 2024-10-26 – 2024-10-29 (×5): 650 mg via ORAL
  Filled 2024-10-25 (×4): qty 2

## 2024-10-25 MED ORDER — FUROSEMIDE 10 MG/ML IJ SOLN
20.0000 mg | Freq: Once | INTRAMUSCULAR | Status: AC
  Administered 2024-10-26: 20 mg via INTRAVENOUS
  Filled 2024-10-25: qty 2

## 2024-10-25 MED ORDER — ACETAMINOPHEN 650 MG RE SUPP
650.0000 mg | Freq: Four times a day (QID) | RECTAL | Status: DC | PRN
  Filled 2024-10-25: qty 1

## 2024-10-25 MED ORDER — ASPIRIN 81 MG PO CHEW: 81.0000 mg | CHEWABLE_TABLET | Freq: Every day | ORAL | Status: DC

## 2024-10-25 MED ORDER — SODIUM CHLORIDE 0.9 % IV BOLUS
1000.0000 mL | Freq: Once | INTRAVENOUS | Status: AC
  Administered 2024-10-25: 1000 mL via INTRAVENOUS

## 2024-10-25 MED ORDER — GABAPENTIN 100 MG PO CAPS
100.0000 mg | ORAL_CAPSULE | Freq: Every day | ORAL | Status: DC
  Administered 2024-10-25 – 2024-10-28 (×4): 100 mg via ORAL
  Filled 2024-10-25 (×4): qty 1

## 2024-10-25 MED ORDER — INSULIN GLARGINE-YFGN 100 UNIT/ML ~~LOC~~ SOLN
28.0000 [IU] | Freq: Every day | SUBCUTANEOUS | Status: DC
  Administered 2024-10-26: 28 [IU] via SUBCUTANEOUS
  Filled 2024-10-25: qty 0.28

## 2024-10-25 NOTE — Assessment & Plan Note (Signed)
 HgB 6.4 on admission. 2 u pRBC was ordered and started in ED.  Pt w/ Hx of Pulmonary Edema. We will plan to give IV Lasix  20 mg once after 2 units of blood.  - Admit to FMTS, attending  - Vital signs per floor - post transfusion H/H - held home torsemide  given AKI, monitor volume status and renal function - nephrology consult in AM, follows with Dr. Tobie with [facility], per chart review may have been considering dialysis soon - consider GI consult given age, elevated BUN/Cr ratio despite negative history, especially if hgb does not improve or renal workup is unrevealing. Unable to see history of colonoscopy. - Pain control: Tylenol  1000 mg  - AM Labs: AM CBC,PT-INR, RFP, Mg - Fall precautions - Delirium precautions - Weight - PT/OT consult

## 2024-10-25 NOTE — Plan of Care (Signed)
 Paged at 1846 for admission. Briefly, patient here with anemia, negative fecal occult, worsening kidney function, plan to receive 2u PRBC per EDP.   Patient seen at bedside. Appears comfortable, no acute distress. Reports feeling okay while sitting in bed, speaking in full sentences. Vitals stable on monitor in room. Patient overall stable for admission by FMTS night team.

## 2024-10-25 NOTE — ED Provider Notes (Addendum)
 [place] EMERGENCY DEPARTMENT AT [facility] Provider Note   CSN: [id number] Arrival date & time: 10/25/24  1719     Patient presents with: Weakness (Anemia)   Lateefa E Donson is a 78 y.o. female presenting ED with 2 weeks of fatigue, headedness, shortness of breath.  She had blood work done 5 days ago was told her hemoglobin was quite low at 6.0.  I confirmed this with external records, hemoglobin 6.0, baseline levels over the past 3 to 4 months typically around 11.   HPI     Prior to Admission medications   Medication Sig Start Date End Date Taking? Authorizing Provider  ACCU-CHEK GUIDE TEST test strip USE TO CHECK BLOOD SUGAR SIX TIMES PER DAY 06/15/24   Howell Lunger, DO  Accu-Chek Softclix Lancets lancets USE TO CHECK BLOOD SUGAR 6 TIMES PER DAY 05/26/24   Howell Lunger, DO  acetaminophen  (TYLENOL ) 500 MG tablet Take 1,000 mg by mouth every 6 (six) hours as needed (for headaches).    [provider]  aspirin  81 MG chewable tablet Chew 81 mg by mouth daily.    [provider]  atorvastatin  (LIPITOR) 40 MG tablet TAKE 1 TABLET EVERY DAY 06/15/24   Howell Lunger, DO  Blood Glucose Monitoring Suppl (ACCU-CHEK GUIDE ME) w/Device KIT Use to check blood sugar 6x per day. E11.65 01/12/22   Rosalynn Camie CROME, MD  calcium -vitamin D  (OSCAL 500/200 D-3) 500-200 MG-UNIT tablet Take 2 tablets by mouth daily with breakfast. Patient taking differently: Take 1 tablet by mouth daily with breakfast. 02/27/16   Alto Lynwood SAUNDERS, MD  carvedilol  (COREG ) 6.25 MG tablet Take 4 tablets (25 mg total) by mouth 2 (two) times daily. 07/03/18 09/24/24  Riccio, Angela C, DO  furosemide  (LASIX ) 40 MG tablet Take 1 tablet (40 mg total) by mouth daily. 05/28/24   Howell Lunger, DO  gabapentin (NEURONTIN) 100 MG capsule Take 100 mg by mouth at bedtime.    [provider]  hydrALAZINE  (APRESOLINE ) 50 MG tablet Take 2 tablets (100 mg total) by mouth 3 (three) times daily. 12/26/23    McDiarmid, Krystal BIRCH, MD  insulin  glargine (LANTUS ) 100 UNIT/ML injection INJECT 28 UNITS UNDER THE SKIN EVERY DAY (DISCARD VIAL 28 DAYS AFTER OPENING) NEED MD APPOINTMENT FOR REFILLS 05/26/24   Howell Lunger, DO  Insulin  Syringe-Needle U-100 (DROPLET INSULIN  SYRINGE) 31G X 5/16 1 ML MISC Inject 1 Lancet into the skin daily. 05/26/24   Howell Lunger, DO  torsemide (DEMADEX) 100 MG tablet Take 100 mg by mouth daily.    [provider]    Allergies: Amlodipine , Augmentin  [amoxicillin -pot clavulanate], Clonidine  derivatives, Doxycycline , and Metformin    Review of Systems  Updated Vital Signs BP (!) 150/46   Pulse 67   Temp 98.1 F (36.7 C)   Resp 18   Ht 5' 3 (1.6 m)   Wt 80.3 kg   LMP  (LMP Unknown)   SpO2 97%   BMI 31.36 kg/m   Physical Exam  (all labs ordered are listed, but only abnormal results are displayed) Labs Reviewed  LIPASE, BLOOD - Abnormal; Notable for the following components:      Result Value   Lipase 53 (*)    All other components within normal limits  COMPREHENSIVE METABOLIC PANEL WITH GFR - Abnormal; Notable for the following components:   CO2 18 (*)    Glucose, Bld 114 (*)    BUN 164 (*)    Creatinine, Ser 5.09 (*)    GFR,  Estimated 8 (*)    All other components within normal limits  CBC - Abnormal; Notable for the following components:   WBC 10.8 (*)    RBC 2.03 (*)    Hemoglobin 6.4 (*)    HCT 20.5 (*)    MCV 101.0 (*)    Platelets 117 (*)    All other components within normal limits  URINALYSIS, ROUTINE W REFLEX MICROSCOPIC - Abnormal; Notable for the following components:   Leukocytes,Ua SMALL (*)    All other components within normal limits  URINALYSIS, MICROSCOPIC (REFLEX) - Abnormal; Notable for the following components:   Bacteria, UA RARE (*)    All other components within normal limits  VITAMIN B12  FOLATE  IRON AND TIBC  FERRITIN  RETICULOCYTES  CREATININE, URINE, RANDOM  NA AND K (SODIUM & POTASSIUM), RAND UR  OCCULT  BLOOD X 1 CARD TO LAB, STOOL  POC OCCULT BLOOD, ED  SAMPLE TO BLOOD BANK  TYPE AND SCREEN  PREPARE RBC (CROSSMATCH)  ABO/RH    EKG: EKG Interpretation Date/Time:  Sunday October 25 2024 17:39:02 EST Ventricular Rate:  67 PR Interval:  198 QRS Duration:  94 QT Interval:  396 QTC Calculation: 418 R Axis:   53  Text Interpretation: Normal sinus rhythm Normal ECG When compared with ECG of 14-Nov-2022 20:49, PREVIOUS ECG IS PRESENT Confirmed by Cottie Cough [phone]) on 10/25/2024 5:53:17 PM  Radiology: No results found.   .Critical Care  Performed by: Cottie Cough PARAS, MD Authorized by: Cottie Cough PARAS, MD   Critical care provider statement:    Critical care time (minutes):  40   Critical care time was exclusive of:  Separately billable procedures and treating other patients   Critical care was necessary to treat or prevent imminent or life-threatening deterioration of the following conditions:  Circulatory failure   Critical care was time spent personally by me on the following activities:  Ordering and performing treatments and interventions, ordering and review of laboratory studies, ordering and review of radiographic studies, pulse oximetry, review of old charts, examination of patient and evaluation of patient's response to treatment    Medications Ordered in the ED  0.9 %  sodium chloride  infusion (Manually program via Guardrails IV Fluids) (has no administration in time range)  sodium chloride  0.9 % bolus 1,000 mL (1,000 mLs Intravenous New Bag/Given 10/25/24 1849)    Clinical Course as of 10/25/24 1854  Sun Oct 25, 2024  1853 Admitted to family practice [MT]    Clinical Course User Index [MT] Cottie Cough PARAS, MD                                 Medical Decision Making Amount and/or Complexity of Data Reviewed Labs: ordered.  Risk Prescription drug management. Decision regarding hospitalization.   This patient presents to the ED with concern for  fatigue. This involves an extensive number of treatment options, and is a complaint that carries with it a high risk of complications and morbidity.  The differential diagnosis includes anemia vs dehydration vs other  Co-morbidities that complicate the patient evaluation: hx of CKD  Additional history obtained from family  External records from outside source obtained and reviewed including hgb trend  I ordered and personally interpreted labs.  The pertinent results include:  hgb 6.4.  worsening CKD, Cr 5.09, BUN 164. Hemoccult negative.   The patient was maintained on a cardiac monitor.  I personally  viewed and interpreted the cardiac monitored which showed an underlying rhythm of: NSR  Per my interpretation the patient's ECG shows NSR  I ordered medication including IV fluids for AKI, pRBC for anemia  I have reviewed the patients home medicines and have made adjustments as needed  Test Considered: no indication for ct angiogram at this time   After the interventions noted above, I reevaluated the patient and found that they have: stayed the same   Suspect anemia due to worsening kidney disease. No emergent indication for dialysis. May benefit from nephrology consult inpatient.  Consented for pRBC   Dispostion:  After consideration of the diagnostic results and the patients response to treatment, I feel that the patent would benefit from medical admission.      Final diagnoses:  Symptomatic anemia  AKI (acute kidney injury)  Uremia    ED Discharge Orders     None          Brendan Gruwell, Donnice PARAS, MD 10/25/24 1844    Cottie Donnice PARAS, MD 10/25/24 [phone]

## 2024-10-25 NOTE — Assessment & Plan Note (Addendum)
 DM 2 : Continue Home Lantus  28 units Daily  HTN : Continue Hydralazine  50 mg QID, held Torsemide 100 mg Daily  HLD : Continue Home Lipitor 40 mg Daily  CAD : Hold home ASA 81 mg daily, Continue home Coreg  6.25 mg BID

## 2024-10-25 NOTE — Assessment & Plan Note (Deleted)
 HgB 6.4 on admission. 2 u pRBC was ordered and started in ED.  Pt w/ Hx of Pulmonary Edema. We will plan to give IV Lasix  20 mg once after 2 units of blood.  - Admit to FMTS, attending  - Vital signs per floor - Pain control: Tylenol  1000 mg  - AM Labs: AM CBC,PT-INR, RFP, Mg - Fall precautions - Delirium precautions - Weight - PT/OT consult

## 2024-10-25 NOTE — ED Notes (Signed)
 2W floor and 2W charge called x2 to notify of pt's arrival. No answer. ED Charge notified.

## 2024-10-25 NOTE — Hospital Course (Addendum)
 Kimberly Carlson is a 78 y.o.female with a history of  CKD, CAD not currently on dialysis who was admitted to the [facility] Teaching Service at [facility] for Anemia. Her hospital course is detailed below:  Acute on chronic anemia She previously received an ESA through nephrology, but this was discontinued when hemoglobin improved to 11.  She was seen by her outpatient nephrology clinic with fatigue, lab work obtained, and was notified by nephrology clinic RN that her HgB was 6.0 and was instructed to get to ED or call 911 if become symptomatic. Kimberly Carlson present at the ED with weakness, fatigue, HA, and SOB. No melena, hematochezia, hematemesis, hematuria or other source of bleeding.  In ED, she was hemodynamically stable.  Her Hgb/Hct 6.4/20.5. 2 u pRBC were given with improvement to 8.9 on recheck. Patient was admitted to the floor. BUN elevated at 164.  Iron 45, saturation 14.  Ferritin elevated at 546, consistent with anemia of chronic disease.  Reticulocytes elevated 5.0.  Component of anemia suspected due to her anemia of chronic disease, but with the significant drop in hemoglobin, drop in iron, elevated BUN, concern for GI bleed.  Patient without history of prior colonoscopy. IV PPI was initiated.  GI was consulted, but patient was not interested in pursuing colonoscopy or endoscopy, so further evaluation of GI bleed was discontinued.  PPI was transition to oral, and patient's home aspirin  for CAD was restarted.  Hemoglobin remained stable after reinitiation.  Nephrology also consulted, and recommended initiation of darbepoetin alpha to be given weekly; however, patient declined citing prior ESA use outpatient. Recommend ESA outpatient for chronic anemia. Patient's hemoglobin stable for several days following transfusions and ASA reinitiation.   CKD Patient with known history of CKD stage V, followed with [facility].  Outpatient discussion of dialysis, but patient had not pursued vascular  catheter access thus far.  Nephrology consulted as above, and patient interested in starting dialysis.  TDC placed and hemodialysis initiated on 10/27/2024. AVF not planned at time of discharge as patient considering home PD. Patient CLIP'd to TCU at the [facility] location on [facility] OP HD in the interim.   TDC oozing TDC placed on 10/27/24 for HD. On 12/4, site had gradual ooze of blood requiring dressing change. Surgicel dressing applied, heparin  DVT prophylaxis held, and patient's oozing discontinued. Hgb remained stable and no bleeding was evident subsequently.  Diastolic hypotension Patient with diastolic hypotension during admission. BP meds held. After initiation of HD, nephrology recommended discontinuing BP meds all together. Additionally, coreg  reduced to 6.25 mg BID given hypotension. Nephrology recommendations for continued torsemide  100 mg on non-dialysis days.   Leg pain Patient with worsening, burning, diffuse bilateral leg pain previously noted at outpatient visits. No focal exam findings. Suspect peripheral neuropathy as etiology for pain. Increased gabapentin , and started cymbalta , voltaren  gel, lidocaine  patches, capsaicin  cream. Patient with some improvement, but not resolution, of pain. Able to ambulate with PT, and discharged home with [facility]   Other chronic conditions were medically managed with home medications and formulary alternatives as necessary (T2DM, HTN)  PCP Follow-up Recommendations: Repeat CBC to assess hemoglobin Encourage ESA outpatient  Assess insulin  needs, as basal reduced to 20 units daily during admission Discharged with BID PPI for one month then recommend once daily PPI Consider dose adjustment of coreg  as appropriate

## 2024-10-25 NOTE — H&P (Cosign Needed Addendum)
 Hospital Admission History and Physical Service Pager: [phone]  Patient name: Kimberly Carlson Medical record number: [id number] Date of Birth: 1946-11-25 Age: 78 y.o. Gender: female  Primary Care Provider: Howell Lunger, DO  Consultants: None Code Status: Full Code which was confirmed with family if patient unable to confirm   Preferred Emergency Contact:  Contact Information     Name Relation Home Work [place] Daughter [phone]  [phone]      Other Contacts   None on File      Chief Complaint:   Differential and Medical Decision Making:  Kimberly Carlson is a 78 y.o. female presenting with weakness and SOB HgB found to be 6.0 DRE/hemoccult negative. Also with acute on chronic anemia in setting of CKD V   Differential for this patient's presentation of this includes   Anemia of chronic disease : most likely. Pt w/ Hx of CKD in late state. HgB was 6.0 2 weeks ago. Will plan on consult nephrologist for follow up in AM  Hemorrhagic : Less likely, patient denies abdominal pain, blood in urine or blood in stool, DRE was negative in ED, hemodynamically stable w/ normal HR, her abdominal assessment was benign.   GI bleed : could also be consider considering this is the first time she has a low HgB per patient. Additionally, has elevated BUN/Cr ratio >30:1 though the diagnostic value of this is impaired by her poor renal function. However, patient states normal BM, no abdominal pain, denies N/V. Denies tarry stool or fresh blood in stool. She has not been on anticoagulant but take ASA 81 mg daily. We will hold ASA 81 mg for now. Will consider GI consult if HgB continue to drop   AKI - Cr 5 up from 3.96 on 10/29. suspect prerenal in setting of poor intake due to fatigue with recent anemia. UA doesn't show significant proteinuria or cellular casts to suggest acute intrinsic injury but also very possible that this is reflective of her worsening CKD - per  chart review was to consider dialysis soon but had deferred her vascular access appointments. Could also be medication induced as she was recently switched from lasix  40mg  BID to torsemide 100mg  daily, will hold this as below. Assessment & Plan Acute on chronic anemia Acute kidney injury superimposed on chronic kidney disease HgB 6.4 on admission. 2 u pRBC was ordered and started in ED.  Pt w/ Hx of Pulmonary Edema. We will plan to give IV Lasix  20 mg once after 2 units of blood.  - Admit to FMTS, attending  - Vital signs per floor - post transfusion H/H - held home torsemide given AKI, monitor volume status and renal function - nephrology consult in AM, follows with Dr. Tobie with [facility], per chart review may have been considering dialysis soon - consider GI consult given age, elevated BUN/Cr ratio despite negative history, especially if hgb does not improve or renal workup is unrevealing. Unable to see history of colonoscopy. - Pain control: Tylenol  1000 mg  - AM Labs: AM CBC,PT-INR, RFP, Mg - Fall precautions - Delirium precautions - Weight - PT/OT consult  Chronic health problem DM 2 : Continue Home Lantus  28 units Daily  HTN : Continue Hydralazine  50 mg QID, held Torsemide 100 mg Daily  HLD : Continue Home Lipitor 40 mg Daily  CAD : Hold home ASA 81 mg daily, Continue home Coreg  6.25 mg BID  FEN/GI: Renal Diet w/ 1200 fluid restriction  VTE Prophylaxis:  SCD  Disposition: Med- Surg  History of Present Illness:  Kimberly Carlson is a 78 y.o. female presenting with weakness, HA, and SOB. Per report she was presenting in ED about 2 weeks ago. Per patient she has been experiencing unusual fatigue. She recently started Epogen  inj and was notified by nephrology clinic RN that her HgB was 6.0 and was instructed to get to ED or call 911 if become symptomatic. Per patient she had Epistaxis last month which has now resolved. Patient denies blood in stool, cough up blood, and blood in urine.  She denies HA, palpitation, dizziness, or abdominal pain. Patient state she still urinate despite her kidney status.   In the ED, she was hemodynamically stable. Pertinent lab include BUN 164 sCr 5.09 GFR 8 WBC 10.8 RBC 203 H/H 6.4/20.5 2 u pRBC were started  Review Of Systems: Per HPI  Pertinent Past Medical History: DM 2 : Continue Home Lantus  28 units Daily  HTN : Continue Hydralazine  50 mg QID, Torsemide  100 mg Daily  HLD : Continue Home Lipitor 40 mg Daily  CAD : Hold home ASA 81 mg daily, Continue home Coreg  6.25 mg BID Anemia d/t Stage 5 CKD Neuropathy : Continue Gabapentin  1000 mg at bedtime    Remainder reviewed in history tab.   Pertinent Past Surgical History: Hysterectomy  Remainder reviewed in history tab.   Pertinent Social History: Tobacco use: Former, quit in 2016 Alcohol use: denies Other Substance use: denies  Pertinent Family History: None contributing   .  Important Outpatient Medications: Lantus  28 units Daily  Hydralazine  50 mg QID Torsemide  100 mg Daily  Lipitor 40 mg Daily  ASA 81 mg daily Coreg  6.25 mg BID Gabapentin  1000 mg at bedtime    Objective: BP (!) 150/46   Pulse 67   Temp 98.1 F (36.7 C)   Resp 18   Ht 5' 3 (1.6 m)   Wt 80.3 kg   LMP  (LMP Unknown)   SpO2 97%   BMI 31.36 kg/m  Exam: General: Non-toxic  Cardiovascular: S1S2 RRR Respiratory: No distress on RA Bilateral lungs diminished at bases otherwise clear to auscultation Gastrointestinal: Soft non-tender Last BM 10/24/24 Neuro: Intact Psych: appropriated  Labs:  CBC BMET  Recent Labs  Lab 10/25/24 1732  WBC 10.8*  HGB 6.4*  HCT 20.5*  PLT 117*   Recent Labs  Lab 10/25/24 1732  NA 137  K 4.1  CL 104  CO2 18*  BUN 164*  CREATININE 5.09*  GLUCOSE 114*  CALCIUM  9.5     EKG:  Vent. rate 67 BPM  PR interval 198 ms QRS duration 94 ms  QT/QTcB 396/418 ms  P-R-T axes 66 53 60  Imaging Studies Performed: None  Suzen Houston NOVAK, DO 10/25/2024,  7:07 PM PGY-1, [facility]  FPTS Intern pager: [phone], text pages welcome Secure chat group [facility] Teaching Service    FPTS Upper-Level Resident Addendum   I have independently interviewed and examined the patient. I have discussed the above with Dr. Suzen and agree with the documented plan. My edits for correction/addition/clarification are included above. Please see any attending notes.   Payton Coward, MD PGY-3, [facility] 10/25/2024 9:25 PM  FPTS Service pager: [phone] (text pages welcome through [facility])

## 2024-10-25 NOTE — Assessment & Plan Note (Addendum)
 HgB 6.4 on admission. 2 u pRBC was ordered and started in ED.  Pt w/ Hx of Pulmonary Edema. We will plan to give IV Lasix  20 mg once after 2 units of blood.  - Admit to FMTS, attending  - Vital signs per floor - post transfusion H/H - held home torsemide  given AKI, monitor volume status and renal function - nephrology consult in AM, follows with Dr. Tobie with [facility], per chart review may have been considering dialysis soon - consider GI consult given age, elevated BUN/Cr ratio despite negative history, especially if hgb does not improve or renal workup is unrevealing. Unable to see history of colonoscopy. - Pain control: Tylenol  1000 mg  - AM Labs: AM CBC,PT-INR, RFP, Mg - Fall precautions - Delirium precautions - Weight - PT/OT consult

## 2024-10-25 NOTE — Plan of Care (Signed)

## 2024-10-25 NOTE — ED Triage Notes (Signed)
 First Nurse Note: Pt has a hx of CKD and anemia. Hgb was 6 on 11/25 and she received an epo injection at that time. Today she has worse feelings of fatigue with some associated ShOB with exertion.

## 2024-10-25 NOTE — Progress Notes (Signed)
 Called ED in reference to Pt coming to unit. No missed calls from ED on [employer]. Informed Pt in route to unit.

## 2024-10-26 ENCOUNTER — Encounter (HOSPITAL_COMMUNITY): Payer: Self-pay

## 2024-10-26 DIAGNOSIS — D649 Anemia, unspecified: Principal | ICD-10-CM | POA: Diagnosis present

## 2024-10-26 DIAGNOSIS — Z992 Dependence on renal dialysis: Secondary | ICD-10-CM | POA: Diagnosis not present

## 2024-10-26 DIAGNOSIS — M79605 Pain in left leg: Secondary | ICD-10-CM | POA: Diagnosis not present

## 2024-10-26 DIAGNOSIS — N19 Unspecified kidney failure: Secondary | ICD-10-CM

## 2024-10-26 DIAGNOSIS — N179 Acute kidney failure, unspecified: Secondary | ICD-10-CM | POA: Diagnosis not present

## 2024-10-26 DIAGNOSIS — M79604 Pain in right leg: Secondary | ICD-10-CM | POA: Diagnosis not present

## 2024-10-26 DIAGNOSIS — M79606 Pain in leg, unspecified: Secondary | ICD-10-CM | POA: Insufficient documentation

## 2024-10-26 DIAGNOSIS — R29898 Other symptoms and signs involving the musculoskeletal system: Secondary | ICD-10-CM | POA: Insufficient documentation

## 2024-10-26 DIAGNOSIS — Z794 Long term (current) use of insulin: Secondary | ICD-10-CM | POA: Diagnosis not present

## 2024-10-26 DIAGNOSIS — N184 Chronic kidney disease, stage 4 (severe): Secondary | ICD-10-CM | POA: Diagnosis not present

## 2024-10-26 DIAGNOSIS — N186 End stage renal disease: Secondary | ICD-10-CM | POA: Diagnosis not present

## 2024-10-26 DIAGNOSIS — E1165 Type 2 diabetes mellitus with hyperglycemia: Secondary | ICD-10-CM | POA: Diagnosis not present

## 2024-10-26 LAB — CBC
HCT: 26.8 % — ABNORMAL LOW (ref 36.0–46.0)
Hemoglobin: 8.9 g/dL — ABNORMAL LOW (ref 12.0–15.0)
MCH: 30.7 pg (ref 26.0–34.0)
MCHC: 33.2 g/dL (ref 30.0–36.0)
MCV: 92.4 fL (ref 80.0–100.0)
Platelets: 108 K/uL — ABNORMAL LOW (ref 150–400)
RBC: 2.9 MIL/uL — ABNORMAL LOW (ref 3.87–5.11)
RDW: 15.6 % — ABNORMAL HIGH (ref 11.5–15.5)
WBC: 9.8 K/uL (ref 4.0–10.5)
nRBC: 0 % (ref 0.0–0.2)

## 2024-10-26 LAB — CBC WITH DIFFERENTIAL/PLATELET
Abs Immature Granulocytes: 0.04 K/uL (ref 0.00–0.07)
Basophils Absolute: 0 K/uL (ref 0.0–0.1)
Basophils Relative: 0 %
Eosinophils Absolute: 0.1 K/uL (ref 0.0–0.5)
Eosinophils Relative: 1 %
HCT: 26.9 % — ABNORMAL LOW (ref 36.0–46.0)
Hemoglobin: 9 g/dL — ABNORMAL LOW (ref 12.0–15.0)
Immature Granulocytes: 0 %
Lymphocytes Relative: 30 %
Lymphs Abs: 3 K/uL (ref 0.7–4.0)
MCH: 30.8 pg (ref 26.0–34.0)
MCHC: 33.5 g/dL (ref 30.0–36.0)
MCV: 92.1 fL (ref 80.0–100.0)
Monocytes Absolute: 0.7 K/uL (ref 0.1–1.0)
Monocytes Relative: 7 %
Neutro Abs: 6 K/uL (ref 1.7–7.7)
Neutrophils Relative %: 62 %
Platelets: 103 K/uL — ABNORMAL LOW (ref 150–400)
RBC: 2.92 MIL/uL — ABNORMAL LOW (ref 3.87–5.11)
RDW: 15.4 % (ref 11.5–15.5)
WBC: 9.9 K/uL (ref 4.0–10.5)
nRBC: 0 % (ref 0.0–0.2)

## 2024-10-26 LAB — RENAL FUNCTION PANEL
Albumin: 3.6 g/dL (ref 3.5–5.0)
Anion gap: 16 — ABNORMAL HIGH (ref 5–15)
BUN: 156 mg/dL — ABNORMAL HIGH (ref 8–23)
CO2: 19 mmol/L — ABNORMAL LOW (ref 22–32)
Calcium: 9.3 mg/dL (ref 8.9–10.3)
Chloride: 105 mmol/L (ref 98–111)
Creatinine, Ser: 4.55 mg/dL — ABNORMAL HIGH (ref 0.44–1.00)
GFR, Estimated: 9 mL/min — ABNORMAL LOW (ref >60)
Glucose, Bld: 80 mg/dL (ref 70–99)
Phosphorus: 5.9 mg/dL — ABNORMAL HIGH (ref 2.5–4.6)
Potassium: 4.3 mmol/L (ref 3.5–5.1)
Sodium: 140 mmol/L (ref 135–145)

## 2024-10-26 LAB — TECHNOLOGIST SMEAR REVIEW: Plt Morphology: NORMAL

## 2024-10-26 LAB — PROTIME-INR
INR: 1.1 (ref 0.8–1.2)
Prothrombin Time: 15.2 s (ref 11.4–15.2)

## 2024-10-26 LAB — BPAM RBC
Blood Product Expiration Date: 202512272359
Blood Product Expiration Date: 202512272359
ISSUE DATE / TIME: 202511301933
ISSUE DATE / TIME: 202511302252
Unit Type and Rh: 5100
Unit Type and Rh: 5100

## 2024-10-26 LAB — TYPE AND SCREEN
ABO/RH(D): O POS
Antibody Screen: NEGATIVE
Unit division: 0
Unit division: 0

## 2024-10-26 LAB — GLUCOSE, CAPILLARY
Glucose-Capillary: 162 mg/dL — ABNORMAL HIGH (ref 70–99)
Glucose-Capillary: 177 mg/dL — ABNORMAL HIGH (ref 70–99)
Glucose-Capillary: 84 mg/dL (ref 70–99)

## 2024-10-26 LAB — MAGNESIUM: Magnesium: 1.5 mg/dL — ABNORMAL LOW (ref 1.7–2.4)

## 2024-10-26 MED ORDER — MAGNESIUM SULFATE IN D5W 1-5 GM/100ML-% IV SOLN
1.0000 g | Freq: Once | INTRAVENOUS | Status: AC
  Administered 2024-10-26: 1 g via INTRAVENOUS
  Filled 2024-10-26 (×2): qty 100

## 2024-10-26 MED ORDER — ATORVASTATIN CALCIUM 40 MG PO TABS
40.0000 mg | ORAL_TABLET | Freq: Every day | ORAL | Status: DC
  Administered 2024-10-26 – 2024-10-29 (×4): 40 mg via ORAL
  Filled 2024-10-26 (×4): qty 1

## 2024-10-26 MED ORDER — INSULIN GLARGINE-YFGN 100 UNIT/ML ~~LOC~~ SOLN
20.0000 [IU] | Freq: Every day | SUBCUTANEOUS | Status: DC
  Administered 2024-10-27 – 2024-10-30 (×3): 20 [IU] via SUBCUTANEOUS
  Filled 2024-10-26 (×5): qty 0.2

## 2024-10-26 MED ORDER — DARBEPOETIN ALFA 100 MCG/0.5ML IJ SOSY
100.0000 ug | PREFILLED_SYRINGE | INTRAMUSCULAR | Status: DC
  Filled 2024-10-26: qty 0.5

## 2024-10-26 MED ORDER — CHLORHEXIDINE GLUCONATE CLOTH 2 % EX PADS
6.0000 | MEDICATED_PAD | Freq: Every day | CUTANEOUS | Status: DC
  Administered 2024-10-27: 6 via TOPICAL

## 2024-10-26 NOTE — TOC Initial Note (Signed)
 Transition of Care [facility]) - Initial/Assessment Note    Patient Details  Name: Kimberly Carlson MRN: [id number] Date of Birth: [birth year]  Transition of [facility]) CM/SW Contact:    Nola Devere Hands, RN Phone Number: 10/26/2024, 9:13 AM  Clinical Narrative:                 Patient is 78 yr old female from home with c/o fatigue and shortness of breath, has hgb of 6. Has CKD, per MD notes Nephrology to be consulted. ICM will continue to follow.        Patient Goals and CMS Choice            Expected Discharge Plan and Services                                              Prior Living Arrangements/Services                       Activities of Daily Living   ADL Screening (condition at time of admission) Independently performs ADLs?: Yes (appropriate for developmental age) Is the patient deaf or have difficulty hearing?: No Does the patient have difficulty seeing, even when wearing glasses/contacts?: No Does the patient have difficulty concentrating, remembering, or making decisions?: No  Permission Sought/Granted                  Emotional Assessment              Admission diagnosis:  Uremia [N19] Anemia [D64.9] AKI (acute kidney injury) [N17.9] Symptomatic anemia [D64.9] Patient Active Problem List   Diagnosis Date Noted   Chronic health problem 10/25/2024   Acute kidney injury superimposed on chronic kidney disease 10/25/2024   Anemia due to stage 5 chronic kidney disease treated with erythropoietin  (HCC) 10/02/2024   CKD stage 4 due to type 2 diabetes mellitus (HCC) 12/26/2023   Obesity, Class I, BMI 30.0-34.9 (see actual BMI) 12/26/2023   Positive colorectal cancer screening using Cologuard test 11/24/2018   Anemia of chronic kidney failure, unspecified stage 07/25/2018   Acute on chronic renal failure 07/01/2018   Proteinuria 02/17/2018   At risk for obstructive sleep apnea 01/07/2018   Healthcare maintenance 11/13/2017    Hypovitaminosis D 02/28/2016   Acute on chronic anemia 08/10/2015   DEGENERATIVE DISC DISEASE 11/09/2009   History of tobacco use 09/03/2009   DM2 (diabetes mellitus, type 2) (HCC) 01/23/2007   Hyperlipidemia associated with type 2 diabetes mellitus (HCC) 01/23/2007   Hypertension associated with diabetes (HCC) 01/23/2007   PCP:  Howell Lunger, DO Pharmacy:   [facility] [phone] - [place], [place] - [address] AT [facility] [address] [facility] [address] [place] [address] Phone: [phone] Fax: [phone]     Social Drivers of Health (SDOH) Social History: SDOH Screenings   Food Insecurity: No Food Insecurity (10/25/2024)  Housing: Low Risk  (10/25/2024)  Transportation Needs: No Transportation Needs (10/25/2024)  Utilities: Not At Risk (10/25/2024)  Depression (PHQ2-9): Low Risk  (07/06/2024)  Financial Resource Strain: Low Risk  (06/23/2024)  Physical Activity: Insufficiently Active (06/23/2024)  Social Connections: Moderately Isolated (10/25/2024)  Stress: No Stress Concern Present (06/23/2024)  Tobacco Use: Medium Risk (10/25/2024)  Health Literacy: Adequate Health Literacy (12/12/2023)   SDOH Interventions:     Readmission Risk Interventions  No data to display

## 2024-10-26 NOTE — Evaluation (Signed)
 Physical Therapy Evaluation Patient Details Name: Kimberly Carlson MRN: [id number] DOB: 01-26-1946 Today's Date: 10/26/2024  History of Present Illness  78 yo female present with weakness and SOB hbg 6.0 pt with workup for GIB.   PMH CKD V chronic anemia, DM2, HTN, HLD, CAD.  Clinical Impression  Patient presents with decreased mobility due to pain in her ankles, decreased balance and decreased activity tolerance.  Previously patient independent though recently using cane at home since she has been ill.  Currently CGA to S for mobility into hallway using RW.  She will benefit from skilled PT in the acute setting for progression of mobility as tolerated and may need HHPT at d/c depending on progress.       If plan is discharge home, recommend the following: A little help with walking and/or transfers;A little help with bathing/dressing/bathroom;Help with stairs or ramp for entrance   Can travel by private vehicle        Equipment Recommendations None recommended by PT  Recommendations for Other Services       Functional Status Assessment Patient has had a recent decline in their functional status and demonstrates the ability to make significant improvements in function in a reasonable and predictable amount of time.     Precautions / Restrictions Precautions Precautions: Fall Recall of Precautions/Restrictions: Intact      Mobility  Bed Mobility Overal bed mobility: Modified Independent                  Transfers Overall transfer level: Needs assistance Equipment used: Rolling walker (2 wheels) Transfers: Sit to/from Stand Sit to Stand: Contact guard assist           General transfer comment: initial stand from EOB with A for anterior weight shift, limited when attempting without RW/assist; stood from toilet in bathroom unaided    Ambulation/Gait Ambulation/Gait assistance: Supervision, Contact guard assist Gait Distance (Feet): 90 Feet Assistive device:  Rolling walker (2 wheels) Gait Pattern/deviations: Step-through pattern, Decreased stride length, Trunk flexed, Shuffle, Wide base of support       General Gait Details: walker a little tall for her, flexed posture and increased walker proximity with cues for posture and staying in walker; c/o ankle pain and R leg from recent sciatic aggravation  Stairs            Wheelchair Mobility     Tilt Bed    Modified Rankin (Stroke Patients Only)       Balance Overall balance assessment: Needs assistance   Sitting balance-Leahy Scale: Good       Standing balance-Leahy Scale: Fair Standing balance comment: managed clothing in bathroom on her own and washing hands at sink                             Pertinent Vitals/Pain Pain Assessment Pain Assessment: 0-10 Pain Score: 8  Pain Location: ankles Pain Descriptors / Indicators: Aching Pain Intervention(s): Monitored during session    Home Living Family/patient expects to be discharged to:: Private residence Living Arrangements: Children Available Help at Discharge: Family Type of Home: House Home Access: Stairs to enter   Secretary/administrator of Steps: 1   Home Layout: One level Home Equipment: Agricultural Consultant (2 wheels);Cane - single point      Prior Function Prior Level of Function : Independent/Modified Independent             Mobility Comments: using a cane since sick ADLs  Comments: cooks, cleans, does not drive     Extremity/Trunk Assessment   Upper Extremity Assessment Upper Extremity Assessment: Overall WFL for tasks assessed    Lower Extremity Assessment Lower Extremity Assessment: Overall WFL for tasks assessed    Cervical / Trunk Assessment Cervical / Trunk Assessment: Kyphotic  Communication   Communication Communication: No apparent difficulties    Cognition Arousal: Alert Behavior During Therapy: WFL for tasks assessed/performed   PT - Cognitive impairments: No  apparent impairments                         Following commands: Intact       Cueing       General Comments General comments (skin integrity, edema, etc.): daughter present and supportive; VSS on RA with ambulation    Exercises     Assessment/Plan    PT Assessment Patient needs continued PT services  PT Problem List Decreased strength;Decreased activity tolerance;Decreased balance;Decreased mobility;Decreased safety awareness       PT Treatment Interventions DME instruction;Gait training;Functional mobility training;Therapeutic activities;Therapeutic exercise;Balance training;Patient/family education    PT Goals (Current goals can be found in the Care Plan section)  Acute Rehab PT Goals Patient Stated Goal: return to independent PT Goal Formulation: With patient/family Time For Goal Achievement: 11/09/24 Potential to Achieve Goals: Good    Frequency Min 2X/week     Co-evaluation               AM-PAC PT 6 Clicks Mobility  Outcome Measure Help needed turning from your back to your side while in a flat bed without using bedrails?: None Help needed moving from lying on your back to sitting on the side of a flat bed without using bedrails?: None Help needed moving to and from a bed to a chair (including a wheelchair)?: A Little Help needed standing up from a chair using your arms (e.g., wheelchair or bedside chair)?: A Little Help needed to walk in hospital room?: A Little Help needed climbing 3-5 steps with a railing? : A Little 6 Click Score: 20    End of Session Equipment Utilized During Treatment: Gait belt Activity Tolerance: Patient tolerated treatment well Patient left: in bed;with family/visitor present;with call bell/phone within reach   PT Visit Diagnosis: Other abnormalities of gait and mobility (R26.89);Muscle weakness (generalized) (M62.81)    Time: 1202-1229 PT Time Calculation (min) (ACUTE ONLY): 27 min   Charges:   PT  Evaluation $PT Eval Low Complexity: 1 Low PT Treatments $Gait Training: 8-22 mins PT General Charges $$ ACUTE PT VISIT: 1 Visit         Micheline Carlson, PT Acute Rehabilitation Services Office:[phone] 10/26/2024   Kimberly Carlson 10/26/2024, 12:58 PM

## 2024-10-26 NOTE — Assessment & Plan Note (Deleted)
 Hgb improved at 8.9 this morning after 2 pRBC. Patient still a bit lightheaded, but no SOB. Will continue to monitor clinically.  - GI consult - continue IV protonix  ISO worsened anemia - AM CBC - Add on LDH, t bili, peripheral smear to prior blood if available

## 2024-10-26 NOTE — Plan of Care (Signed)

## 2024-10-26 NOTE — Assessment & Plan Note (Signed)
 HgB 6.4 on admission. 2 u pRBC was ordered and started in ED.  Pt w/ Hx of Pulmonary Edema. We will plan to give IV Lasix  20 mg once after 2 units of blood.  - Admit to FMTS, attending  - Vital signs per floor - post transfusion H/H - held home torsemide  given AKI, monitor volume status and renal function - nephrology consult in AM, follows with Dr. Tobie with [facility], per chart review may have been considering dialysis soon - consider GI consult given age, elevated BUN/Cr ratio despite negative history, especially if hgb does not improve or renal workup is unrevealing. Unable to see history of colonoscopy. - Pain control: Tylenol  1000 mg  - AM Labs: AM CBC,PT-INR, RFP, Mg - Fall precautions - Delirium precautions - Weight - PT/OT consult

## 2024-10-26 NOTE — Assessment & Plan Note (Signed)
 DM 2 : Continue Home Lantus  28 units Daily  HTN : Continue Hydralazine  50 mg QID, held Torsemide 100 mg Daily  HLD : Continue Home Lipitor 40 mg Daily  CAD : Hold home ASA 81 mg daily, Continue home Coreg  6.25 mg BID

## 2024-10-26 NOTE — Consult Note (Signed)
 [facility] Renal Consultation Note  Requesting MD: Rollene Keeling, MD Indication for Consultation:  advanced CKD and anemia   Chief complaint:  fatigue and shortness of breath  HPI:  Kimberly Carlson is a 78 y.o. female with a history of CKD stage V, diabetes mellitus, distant cocaine use, and hypertension who presented to the hospital with shortness of breath, weakness, and fatigue.  She was found to have acute on chronic anemia and is suspected to have a component of CKD contributing to her anemia.  They have also consulted GI due to concern for GI bleed.  Team is requesting input specifically on whether or not she would be appropriate for ESA.  Hemoglobin was 6.4 yesterday and she received 2 units of packed red blood cells.  For reference, the patient follows with Dr. Tobie outpatient and on 09/23/2024, her creatinine was 3.96 and BUN was 100.  At that appointment, she deferred a referral to [facility].  She was reported to be prescribed Lasix  40 mg twice a day but was just taking it daily per his note.  She was previously on ESA outpatient through our office however Dr. Anthony note indicates that this had been held as her hemoglobin had improved to greater than 11.  She had one unmeasured urine void over 11/30 charted.  Nephrology is consulted for assistance with management of advanced CKD and anemia.  She denies any blood per rectum or dark stools.  She has recently used a cane to walk around but prior to feeling poorly she did not even need this.  She and I discussed the risks, benefits, and indications for dialysis and she does consent to dialysis.  She had been hoping to avoid dialysis.  She was worried about how she would get back and forth to dialysis and I let her know there are programs that offer assistance with this.  She asks if it is possible to do dialysis at home.  We discussed types of dialysis.     Creat  Date/Time Value Ref Range Status  02/07/2017 04:20 PM 2.02 (H)  0.60 - 0.93 mg/dL Final    Comment:      For patients > or = 80 years of age: The upper reference limit for Creatinine is approximately 13% higher for people identified as African-American.     10/17/2016 11:11 AM 1.72 (H) 0.60 - 0.93 mg/dL Final    Comment:      For patients > or = 80 years of age: The upper reference limit for Creatinine is approximately 13% higher for people identified as African-American.     10/03/2015 09:38 AM 1.17 (H) 0.60 - 0.93 mg/dL Final  89/82/7983 87:95 PM 1.11 (H) 0.60 - 0.93 mg/dL Final  96/75/7983 96:92 PM 1.20 (H) 0.50 - 1.10 mg/dL Final  89/83/7985 87:72 PM 0.91 0.50 - 1.10 mg/dL Final  92/97/7985 90:48 AM 1.24 (H) 0.50 - 1.10 mg/dL Final  94/89/7986 96:77 PM 0.94 0.50 - 1.10 mg/dL Final   Creatinine, Ser  Date/Time Value Ref Range Status  10/26/2024 04:37 AM 4.55 (H) 0.44 - 1.00 mg/dL Final  88/69/7974 94:67 PM 5.09 (H) 0.44 - 1.00 mg/dL Final  91/94/7974 98:59 PM 3.25 (H) 0.44 - 1.00 mg/dL Final  92/90/7974 98:60 PM 3.05 (H) 0.44 - 1.00 mg/dL Final  93/89/7974 97:97 PM 3.50 (H) 0.44 - 1.00 mg/dL Final  94/86/7974 98:41 PM 3.78 (H) 0.44 - 1.00 mg/dL Final  95/98/7974 98:43 PM 3.28 (H) 0.44 - 1.00 mg/dL Final  96/95/7974 98:46  PM 2.98 (H) 0.44 - 1.00 mg/dL Final  97/95/7974 98:44 PM 2.94 (H) 0.44 - 1.00 mg/dL Final  98/92/7974 98:40 PM 3.29 (H) 0.44 - 1.00 mg/dL Final  87/89/7975 98:54 PM 3.26 (H) 0.44 - 1.00 mg/dL Final  88/87/7975 98:52 PM 3.61 (H) 0.44 - 1.00 mg/dL Final  89/97/7975 98:40 PM 3.18 (H) 0.44 - 1.00 mg/dL Final  90/95/7975 98:45 PM 3.57 (H) 0.44 - 1.00 mg/dL Final  91/93/7975 97:99 PM 3.38 (H) 0.44 - 1.00 mg/dL Final  92/90/7975 97:93 PM 3.17 (H) 0.44 - 1.00 mg/dL Final  93/88/7975 96:99 PM 2.94 (H) 0.44 - 1.00 mg/dL Final  94/85/7975 97:92 PM 3.50 (H) 0.44 - 1.00 mg/dL Final  95/97/7975 97:99 PM 3.04 (H) 0.44 - 1.00 mg/dL Final  96/94/7975 97:69 PM 3.23 (H) 0.44 - 1.00 mg/dL Final  97/93/7975 97:96 PM 3.21 (H) 0.44 -  1.00 mg/dL Final  98/89/7975 96:99 PM 2.91 (H) 0.44 - 1.00 mg/dL Final  98/94/7975 87:71 PM 3.30 (H) 0.57 - 1.00 mg/dL Final  87/79/7976 91:65 PM 3.80 (H) 0.44 - 1.00 mg/dL Final  87/87/7976 97:86 PM 3.51 (H) 0.44 - 1.00 mg/dL Final  88/85/7976 97:82 PM 3.89 (H) 0.44 - 1.00 mg/dL Final  89/96/7976 97:93 PM 3.67 (H) 0.44 - 1.00 mg/dL Final  90/94/7976 97:90 PM 3.34 (H) 0.44 - 1.00 mg/dL Final  91/91/7976 98:40 PM 3.66 (H) 0.44 - 1.00 mg/dL Final  92/88/7976 97:95 PM 3.95 (H) 0.44 - 1.00 mg/dL Final  93/86/7976 97:89 PM 3.88 (H) 0.44 - 1.00 mg/dL Final  94/97/7976 98:59 PM 3.44 (H) 0.44 - 1.00 mg/dL Final  95/95/7976 98:53 PM 3.18 (H) 0.44 - 1.00 mg/dL Final  96/92/7976 98:40 PM 3.03 (H) 0.44 - 1.00 mg/dL Final  97/92/7976 97:98 PM 4.14 (H) 0.44 - 1.00 mg/dL Final  98/89/7976 97:47 PM 3.32 (H) 0.44 - 1.00 mg/dL Final  87/86/7977 97:96 PM 3.12 (H) 0.44 - 1.00 mg/dL Final  88/98/7977 98:64 PM 2.84 (H) 0.44 - 1.00 mg/dL Final  89/95/7977 98:77 PM 3.56 (H) 0.44 - 1.00 mg/dL Final  90/93/7977 98:69 PM 3.29 (H) 0.44 - 1.00 mg/dL Final  91/90/7977 98:81 PM 3.48 (H) 0.44 - 1.00 mg/dL Final  92/87/7977 98:95 PM 3.32 (H) 0.44 - 1.00 mg/dL Final  93/85/7977 98:62 PM 3.21 (H) 0.44 - 1.00 mg/dL Final  94/96/7977 87:80 PM 2.97 (H) 0.44 - 1.00 mg/dL Final  95/94/7977 98:93 PM 3.13 (H) 0.44 - 1.00 mg/dL Final  96/91/7977 97:99 PM 3.16 (H) 0.44 - 1.00 mg/dL Final  97/91/7977 98:41 PM 3.76 (H) 0.44 - 1.00 mg/dL Final  98/88/7977 98:45 PM 3.33 (H) 0.44 - 1.00 mg/dL Final  87/85/7978 87:68 PM 3.32 (H) 0.44 - 1.00 mg/dL Final  88/97/7978 98:82 PM 2.96 (H) 0.44 - 1.00 mg/dL Final  89/94/7978 98:91 PM 2.80 (H) 0.44 - 1.00 mg/dL Final  90/92/7978 98:65 PM 3.22 (H) 0.44 - 1.00 mg/dL Final     PMHx:   Past Medical History:  Diagnosis Date   Acute cystitis without hematuria 04/07/2020   Back pain    herniated disc lower back   Cocaine use 06/08/2015   UDS 2013     Diabetes mellitus without  complication (HCC)    Frequent nosebleeds 09/15/2021   Hematuria, undiagnosed cause 06/09/2015   HERNIATED LUMBAR DISC 05/14/2007   Qualifier: Diagnosis of By: Rosalynn MD, Sara   MRI on 05/05/2007: small soft disk herniation L2-3. Slight mass effect left L2 nerve root. L5-S1 arthritis with narrowing of the Right lateral recess.  Hypertension    Vertigo 07/16/2019    Past Surgical History:  Procedure Laterality Date   ABDOMINAL HYSTERECTOMY      Family Hx:  Family History  Adopted: Yes    Social History:  reports that she quit smoking about 9 years ago. Her smoking use included cigarettes. She started smoking about 10 years ago. She has a 1.2 pack-year smoking history. She has never used smokeless tobacco. She reports that she does not drink alcohol and does not use drugs.  Allergies:  Allergies  Allergen Reactions   Amlodipine  Swelling    Bilateral leg swelling with multiple doses including 2.5mg  daily.  Stopped therapy and edema improved.    Augmentin  [Amoxicillin -Pot Clavulanate] Diarrhea   Clonidine  Derivatives Other (See Comments)    Light-headedness, dizzy, extreme dry mouth   Doxycycline  Nausea And Vomiting   Metformin Diarrhea    Medications: Prior to Admission medications   Medication Sig Start Date End Date Taking? Authorizing Provider  acetaminophen  (TYLENOL ) 500 MG tablet Take 1,000 mg by mouth every 6 (six) hours as needed for mild pain (pain score 1-3) (or headaches).   Yes [provider]  aspirin  81 MG chewable tablet Chew 81 mg by mouth daily.   Yes [provider]  atorvastatin  (LIPITOR) 40 MG tablet TAKE 1 TABLET EVERY DAY Patient taking differently: Take 40 mg by mouth at bedtime. 06/15/24  Yes Howell Lunger, DO  carvedilol  (COREG ) 25 MG tablet Take 25 mg by mouth 2 (two) times daily with a meal.   Yes [provider]  Cholecalciferol  (VITAMIN D3) 125 MCG (5000 UT) CAPS Take 5,000 Units by mouth daily.   Yes [provider]  doxazosin (CARDURA) 4 MG tablet Take 6 mg by mouth at bedtime.   Yes [provider]  gabapentin (NEURONTIN) 100 MG capsule Take 100 mg by mouth at bedtime.   Yes [provider]  hydrALAZINE  (APRESOLINE ) 100 MG tablet Take 100 mg by mouth See admin instructions. Take 100 mg by mouth in the morning and at 3 PM   Yes [provider]  insulin  glargine (LANTUS ) 100 UNIT/ML injection INJECT 28 UNITS UNDER THE SKIN EVERY DAY (DISCARD VIAL 28 DAYS AFTER OPENING) NEED MD APPOINTMENT FOR REFILLS Patient taking differently: Inject 28 Units into the skin daily before breakfast. 05/26/24  Yes Howell Lunger, DO  spironolactone  (ALDACTONE ) 50 MG tablet Take 50 mg by mouth daily.   Yes [provider]  torsemide (DEMADEX) 100 MG tablet Take 100 mg by mouth in the morning.   Yes [provider]  ACCU-CHEK GUIDE TEST test strip USE TO CHECK BLOOD SUGAR SIX TIMES PER DAY 06/15/24   Howell Lunger, DO  Accu-Chek Softclix Lancets lancets USE TO CHECK BLOOD SUGAR 6 TIMES PER DAY 05/26/24   Howell Lunger, DO  Blood Glucose Monitoring Suppl (ACCU-CHEK GUIDE ME) w/Device KIT Use to check blood sugar 6x per day. E11.65 01/12/22   Rosalynn Camie CROME, MD  calcium -vitamin D  (OSCAL 500/200 D-3) 500-200 MG-UNIT tablet Take 2 tablets by mouth daily with breakfast. Patient not taking: Reported on 10/25/2024 02/27/16   Alto Lynwood SAUNDERS, MD  carvedilol  (COREG ) 6.25 MG tablet Take 4 tablets (25 mg total) by mouth 2 (two) times daily. Patient not taking: Reported on 10/25/2024 07/03/18 10/25/24  Riccio, Angela C, DO  furosemide  (LASIX ) 40 MG tablet Take 1 tablet (40 mg total) by mouth daily. Patient not taking: Reported on 10/25/2024 05/28/24   Howell Lunger, DO  Insulin  Syringe-Needle U-100 (DROPLET  INSULIN  SYRINGE) 31G X 5/16 1 ML MISC Inject 1 Lancet into the skin daily. 05/26/24   Howell Lunger, DO    I have reviewed the patient's current and reported prior to admission  medications.   Labs:     Latest Ref Rng & Units 10/26/2024    4:37 AM 10/25/2024    5:32 PM 06/30/2024    1:40 PM  BMP  Glucose 70 - 99 mg/dL 80  885  84   BUN 8 - 23 mg/dL 843  835  81   Creatinine 0.44 - 1.00 mg/dL 5.44  4.90  6.74   Sodium 135 - 145 mmol/L 140  137  140   Potassium 3.5 - 5.1 mmol/L 4.3  4.1  4.7   Chloride 98 - 111 mmol/L 105  104  113   CO2 22 - 32 mmol/L 19  18  18    Calcium  8.9 - 10.3 mg/dL 9.3  9.5  9.4     Urinalysis    Component Value Date/Time   COLORURINE YELLOW 10/25/2024 1732   APPEARANCEUR CLEAR 10/25/2024 1732   LABSPEC 1.010 10/25/2024 1732   PHURINE 6.0 10/25/2024 1732   GLUCOSEU NEGATIVE 10/25/2024 1732   HGBUR NEGATIVE 10/25/2024 1732   BILIRUBINUR NEGATIVE 10/25/2024 1732   BILIRUBINUR negative 11/30/2022 1020   BILIRUBINUR NEG 10/17/2016 1045   KETONESUR NEGATIVE 10/25/2024 1732   PROTEINUR NEGATIVE 10/25/2024 1732   UROBILINOGEN 0.2 11/30/2022 1020   UROBILINOGEN 0.2 06/20/2015 2227   NITRITE NEGATIVE 10/25/2024 1732   LEUKOCYTESUR SMALL (A) 10/25/2024 1732     ROS:  Pertinent items noted in HPI and remainder of comprehensive ROS otherwise negative.  Physical Exam: Vitals:   10/26/24 1147 10/26/24 1646  BP: (!) 114/37 (!) 134/48  Pulse: 66 67  Resp:  15  Temp: 97.6 F (36.4 C) 98.6 F (37 C)  SpO2: 98% 100%     General:  elderly female in bed in NAD  HEENT: NCAT Eyes: EOMI sclera anicteric Neck: supple trachea midline Heart: S1S2 no rub Lungs: clear to auscultation bilaterally normal work of breathing on room air  Abdomen: soft/nt/nd Extremities: no edema appreciated; no cyanosis or clubbing  Skin: No rash on extremities exposed Neuro: Alert and oriented x 3 provides a history and follows commands Psych normal mood and affect GU-no Foley  Assessment/Plan:  # CKD stage V with progression to ESRD - She consents to starting dialysis on 12/2 after tunneled catheter placement with IR - Consult IR for tunneled  catheter placement - N.p.o. after midnight for catheter as above - Note BUN was 100 on 09/23/24 - Would stop spironolactone   - Strict ins/outs and daily weights are ordered  # Anemia - acute on chronic - concern for acute blood loss and also with contribution from CKD  - Will start ESA - aranesp 100 mcg weekly on Mondays for now.  Was on ESA previously and Hb improved so was stopped   - PRBC's per primary team  - Agree with consulting GI  - she is on a PPI  # HTN  - acceptable control - avoid hypotension    # Metabolic acidosis - Starting HD   Disposition - continue inpatient monitoring    Katheryn JAYSON Saba 10/26/2024, 7:29 PM

## 2024-10-26 NOTE — Progress Notes (Signed)
 Daily Progress Note Intern Pager: [phone]  Patient name: Kimberly Carlson Medical record number: [id number] Date of birth: 1946/05/10 Age: 78 y.o. Gender: female  Primary Care Provider: Howell Lunger, DO Consultants: none Code Status: full code  Pt Overview and Major Events to Date:  11/30 - admitted   Assessment and Plan:  78 yo with PMH of CKD stage 5 who was admitted with acute worsening of chronic anemia, suspected secondary to anemia of chronic disease with elevated ferritin and normal iron saturations. Notably her iron is half of what it was in August and on prior iron measures, this may represent an acute decrease in the setting of a GI bleed, as her subacute drop in hemoglobin does not seem entirely explained by her anemia of chronic disease. Additionally, her reticulocyte is appropriately elevated, which would be less likely if her anemia was entirely due to CKD/EPO deficiency. Assessment & Plan Acute on chronic anemia Acute kidney injury superimposed on chronic kidney disease HgB 6.4 on admission. 2 u pRBC was ordered and started in ED.  Pt w/ Hx of Pulmonary Edema. We will plan to give IV Lasix  20 mg once after 2 units of blood.  - Admit to FMTS, attending  - Vital signs per floor - post transfusion H/H - held home torsemide given AKI, monitor volume status and renal function - nephrology consult in AM, follows with Dr. Tobie with [facility], per chart review may have been considering dialysis soon - consider GI consult given age, elevated BUN/Cr ratio despite negative history, especially if hgb does not improve or renal workup is unrevealing. Unable to see history of colonoscopy. - Pain control: Tylenol  1000 mg  - AM Labs: AM CBC,PT-INR, RFP, Mg - Fall precautions - Delirium precautions - Weight - PT/OT consult  Chronic health problem DM 2 : Continue Home Lantus  28 units Daily  HTN : Continue Hydralazine  50 mg QID, held Torsemide 100 mg Daily  HLD : Continue Home  Lipitor 40 mg Daily  CAD : Hold home ASA 81 mg daily, Continue home Coreg  6.25 mg BID Leg weakness  Anemia   FEN/GI: renal PPx: held iso acute anemia Dispo:Pending PT recommendations  pending clinical improvement .    Subjective:  Patient does have some lightheadedness this morning. No SOB, CP, palpitations, abd pain. Notes bilateral ankle pain and right leg pain and weakness, no injury. Has been chronic. No melena, hematochezia, hematuria.   Objective: Temp:  [97.5 F (36.4 C)-98.6 F (37 C)] 97.6 F (36.4 C) (12/01 1147) Pulse Rate:  [63-74] 66 (12/01 1147) Resp:  [12-20] 20 (12/01 0759) BP: (114-158)/(37-53) 114/37 (12/01 1147) SpO2:  [97 %-100 %] 98 % (12/01 1147) Weight:  [77.2 kg-80.3 kg] 77.2 kg (12/01 0500)  Physical Exam: General: well appearing woman sitting up in bed Cardiovascular: RRR, early systolic murmur Respiratory: CTAB Abdomen: soft, nontender Extremities: diffuse tenderness in the bilateral ankles and shins with any light palpation, trace edema in the LLE, no edema in the RLE.   Laboratory: Most recent CBC Lab Results  Component Value Date   WBC 9.8 10/26/2024   WBC 9.9 10/26/2024   HGB 8.9 (L) 10/26/2024   HGB 9.0 (L) 10/26/2024   HCT 26.8 (L) 10/26/2024   HCT 26.9 (L) 10/26/2024   MCV 92.4 10/26/2024   MCV 92.1 10/26/2024   PLT 108 (L) 10/26/2024   PLT 103 (L) 10/26/2024   Most recent BMP    Latest Ref Rng & Units 10/26/2024  4:37 AM  BMP  Glucose 70 - 99 mg/dL 80   BUN 8 - 23 mg/dL 843   Creatinine 9.55 - 1.00 mg/dL 5.44   Sodium 864 - 854 mmol/L 140   Potassium 3.5 - 5.1 mmol/L 4.3   Chloride 98 - 111 mmol/L 105   CO2 22 - 32 mmol/L 19   Calcium  8.9 - 10.3 mg/dL 9.3    Phos 5.9 Magnesium 1.5   Imaging/Diagnostic Tests: None  Alena Morrison, Yanina Knupp, MD 10/26/2024, 2:15 PM  PGY-1, [facility] FPTS Intern pager: [phone], text pages welcome Secure chat group [facility] Teaching Service

## 2024-10-26 NOTE — Consult Note (Addendum)
 Consultation  Referring Provider: [facility] Primary Care Physician:  Howell Lunger, DO Primary Gastroenterologist: [place]  Reason for Consultation: Progressive anemia   Attending physician's note  I personally saw the patient and performed a substantive portion of the medical decision making process for this encounter (including a complete performance of the key components : MDM, Hx and Exam), in conjunction with the APP.  I agree with the APP's note, impression, and  the management plan for the number and complexity of problems addressed at the encounter for the patient and take responsibility for that plan with its inherent risk of complications, morbidity, or mortality with additional input as follows.    55 yr F admitted with fatigue, worsening anemia  Heme occult negative, not iron deficient  CKD stage IV, nephrology was considering starting dialysis as outpatient prior to this admission   Patient is not interested in undergoing EGD or colonoscopy or any GI work up at this point for evaluation of worsening anemia  Please re consult GI if needed    [name] , MD [phone]     HPI: Kimberly Carlson is a 78 y.o. female who was admitted to the hospital yesterday after she presented to the ER with complaints of 2-week history of somewhat progressive fatigue and shortness of breath.  She had had outpatient labs done 5 days prior to admission showing a hemoglobin of 6 and had been advised to come to the ER. Her baseline hemoglobin is in the 11 range. She does have history of insulin -dependent diabetes mellitus, hypertension, lumbar disc disease and underlying chronic kidney disease.  Documented Hemoccult negative on admission She has been transfused 2 units of packed RBCs and hemoglobin is 8.9 today today/hematocrit 26.8 MCV 101/platelets 117 BUN 156/creatinine 4.55 Ferritin 546/serum iron 45/TIBC 315/iron sat 14 Pro time 15.2/INR  1.1 B12/folate within normal limits.  Patient has not had any previous GI evaluation/no prior EGD or colonoscopy.  She has no current GI complaints, specifically denies any problems with abdominal pain or discomfort, no recent changes in her bowel habits, unaware of any recent melena or hematochezia.  No problems with nausea or vomiting, no dysphagia or odynophagia.  He does take a baby aspirin  daily, no other blood thinners. Family history negative for GI disease as far she is aware.  She is followed by nephrology as an outpatient, per notes there has been consideration for initiating dialysis soon.  Nephrology to see here.  On my discussion with the patient today she is not inclined to undergo any invasive endoscopic evaluation, states she does not think she would want to undergo endoscopy or colonoscopy.   Past Medical History:  Diagnosis Date   Acute cystitis without hematuria 04/07/2020   Back pain    herniated disc lower back   Cocaine use 06/08/2015   UDS 2013     Diabetes mellitus without complication (HCC)    Frequent nosebleeds 09/15/2021   Hematuria, undiagnosed cause 06/09/2015   HERNIATED LUMBAR DISC 05/14/2007   Qualifier: Diagnosis of By: Rosalynn MD, Sara   MRI on 05/05/2007: small soft disk herniation L2-3. Slight mass effect left L2 nerve root. L5-S1 arthritis with narrowing of the Right lateral recess.      Hypertension    Vertigo 07/16/2019    Past Surgical History:  Procedure Laterality Date   ABDOMINAL HYSTERECTOMY      Prior to Admission medications   Medication Sig Start Date End Date Taking? Authorizing Provider  acetaminophen  (TYLENOL ) 500  MG tablet Take 1,000 mg by mouth every 6 (six) hours as needed for mild pain (pain score 1-3) (or headaches).   Yes [provider]  aspirin  81 MG chewable tablet Chew 81 mg by mouth daily.   Yes [provider]  atorvastatin  (LIPITOR) 40 MG tablet TAKE 1 TABLET EVERY DAY Patient taking differently: Take 40  mg by mouth at bedtime. 06/15/24  Yes Howell Lunger, DO  carvedilol  (COREG ) 25 MG tablet Take 25 mg by mouth 2 (two) times daily with a meal.   Yes [provider]  Cholecalciferol  (VITAMIN D3) 125 MCG (5000 UT) CAPS Take 5,000 Units by mouth daily.   Yes [provider]  doxazosin (CARDURA) 4 MG tablet Take 6 mg by mouth at bedtime.   Yes [provider]  gabapentin (NEURONTIN) 100 MG capsule Take 100 mg by mouth at bedtime.   Yes [provider]  hydrALAZINE  (APRESOLINE ) 100 MG tablet Take 100 mg by mouth See admin instructions. Take 100 mg by mouth in the morning and at 3 PM   Yes [provider]  insulin  glargine (LANTUS ) 100 UNIT/ML injection INJECT 28 UNITS UNDER THE SKIN EVERY DAY (DISCARD VIAL 28 DAYS AFTER OPENING) NEED MD APPOINTMENT FOR REFILLS Patient taking differently: Inject 28 Units into the skin daily before breakfast. 05/26/24  Yes Howell Lunger, DO  spironolactone  (ALDACTONE ) 50 MG tablet Take 50 mg by mouth daily.   Yes [provider]  torsemide (DEMADEX) 100 MG tablet Take 100 mg by mouth in the morning.   Yes [provider]  ACCU-CHEK GUIDE TEST test strip USE TO CHECK BLOOD SUGAR SIX TIMES PER DAY 06/15/24   Howell Lunger, DO  Accu-Chek Softclix Lancets lancets USE TO CHECK BLOOD SUGAR 6 TIMES PER DAY 05/26/24   Howell Lunger, DO  Blood Glucose Monitoring Suppl (ACCU-CHEK GUIDE ME) w/Device KIT Use to check blood sugar 6x per day. E11.65 01/12/22   Rosalynn Camie CROME, MD  calcium -vitamin D  (OSCAL 500/200 D-3) 500-200 MG-UNIT tablet Take 2 tablets by mouth daily with breakfast. Patient not taking: Reported on 10/25/2024 02/27/16   Alto Lynwood SAUNDERS, MD  carvedilol  (COREG ) 6.25 MG tablet Take 4 tablets (25 mg total) by mouth 2 (two) times daily. Patient not taking: Reported on 10/25/2024 07/03/18 10/25/24  Riccio, Angela C, DO  furosemide  (LASIX ) 40 MG tablet Take 1 tablet (40 mg total) by mouth daily. Patient not taking:  Reported on 10/25/2024 05/28/24   Howell Lunger, DO  Insulin  Syringe-Needle U-100 (DROPLET INSULIN  SYRINGE) 31G X 5/16 1 ML MISC Inject 1 Lancet into the skin daily. 05/26/24   Howell Lunger, DO    Current Facility-Administered Medications  Medication Dose Route Frequency Provider Last Rate Last Admin   acetaminophen  (TYLENOL ) tablet 650 mg  650 mg Oral Q6H PRN Suknaim, Kulkaew B, DO   650 mg at 10/26/24 1655   Or   acetaminophen  (TYLENOL ) suppository 650 mg  650 mg Rectal Q6H PRN Suknaim, Kulkaew B, DO       atorvastatin  (LIPITOR) tablet 40 mg  40 mg Oral QHS Nygaard, Joseph, MD       carvedilol  (COREG ) tablet 25 mg  25 mg Oral BID WC Suknaim, Kulkaew B, DO   25 mg at 10/26/24 1524   gabapentin (NEURONTIN) capsule 100 mg  100 mg Oral QHS Suknaim, Kulkaew B, DO   100 mg at 10/25/24 2301   [START ON 10/27/2024] insulin  glargine-yfgn (SEMGLEE ) injection 20 Units  20 Units Subcutaneous Daily Nygaard, Joseph,  MD       pantoprazole  (PROTONIX ) injection 40 mg  40 mg Intravenous Q12H Donzetta Rollene BRAVO, MD   40 mg at 10/26/24 9144    Allergies as of 10/25/2024 - Review Complete 10/25/2024  Allergen Reaction Noted   Amlodipine  Swelling 10/03/2015   Augmentin  [amoxicillin -pot clavulanate] Diarrhea 11/20/2016   Clonidine  derivatives Other (See Comments) 10/25/2015   Doxycycline  Nausea And Vomiting 11/22/2016   Metformin Diarrhea 12/04/2007    Family History  Adopted: Yes    Social History   Socioeconomic History   Marital status: Widowed    Spouse name: Not on file   Number of children: Not on file   Years of education: Not on file   Highest education level: GED or equivalent  Occupational History   Not on file  Tobacco Use   Smoking status: Former    Current packs/day: 0.00    Average packs/day: 1 pack/day for 1.2 years (1.2 ttl pk-yrs)    Types: Cigarettes    Start date: 04/26/2014    Quit date: 07/23/2015    Years since quitting: 9.2   Smokeless tobacco: Never   Tobacco comments:     Started in her 42's with multiple quit attempts for about 1 year at a time.  Vaping Use   Vaping status: Never Used  Substance and Sexual Activity   Alcohol use: No   Drug use: No   Sexual activity: Not on file  Other Topics Concern   Not on file  Social History Narrative   Not on file   Social Drivers of Health   Financial Resource Strain: Low Risk  (06/23/2024)   Overall Financial Resource Strain (CARDIA)    Difficulty of Paying Living Expenses: Not hard at all  Food Insecurity: No Food Insecurity (10/25/2024)   Hunger Vital Sign    Worried About Running Out of Food in the Last Year: Never true    Ran Out of Food in the Last Year: Never true  Transportation Needs: No Transportation Needs (10/25/2024)   PRAPARE - Administrator, Civil Service (Medical): No    Lack of Transportation (Non-Medical): No  Physical Activity: Insufficiently Active (06/23/2024)   Exercise Vital Sign    Days of Exercise per Week: 7 days    Minutes of Exercise per Session: 20 min  Stress: No Stress Concern Present (06/23/2024)   [employer] of Occupational Health - Occupational Stress Questionnaire    Feeling of Stress: Not at all  Social Connections: Moderately Isolated (10/25/2024)   Social Connection and Isolation Panel    Frequency of Communication with Friends and Family: More than three times a week    Frequency of Social Gatherings with Friends and Family: Three times a week    Attends Religious Services: Never    Active Member of Clubs or Organizations: No    Attends Engineer, Structural: More than 4 times per year    Marital Status: Widowed  Intimate Partner Violence: Not At Risk (10/25/2024)   Humiliation, Afraid, Rape, and Kick questionnaire    Fear of Current or Ex-Partner: No    Emotionally Abused: No    Physically Abused: No    Sexually Abused: No    Review of Systems: Pertinent positive and negative review of systems were noted in the above HPI  section.  All other review of systems was otherwise negative.   Physical Exam: Vital signs in last 24 hours: Temp:  [97.5 F (36.4 C)-98.6 F (37 C)] 98.6 F (  37 C) (12/01 1646) Pulse Rate:  [63-74] 67 (12/01 1646) Resp:  [12-20] 15 (12/01 1646) BP: (114-158)/(37-53) 134/48 (12/01 1646) SpO2:  [98 %-100 %] 100 % (12/01 1646) Weight:  [77.2 kg] 77.2 kg (12/01 0500) Last BM Date : 10/24/24 General:   Alert,  Well-developed, well-nourished, elderly African-American female pleasant and cooperative in NAD, eating dinner Head:  Normocephalic and atraumatic. Eyes:  Sclera clear, no icterus.   Conjunctiva pale Ears:  Normal auditory acuity. Nose:  No deformity, discharge,  or lesions. Mouth:  No deformity or lesions.   Neck:  Supple; no masses or thyromegaly. Lungs:  Clear throughout to auscultation.  Few fine basilar rales  heart:  Regular rate and rhythm; systolic murmur present Abdomen:  Soft,nontender, BS active,nonpalp mass or hsm.   Rectal: Not done, documented Hemoccult negative on admission Msk:  Symmetrical without gross deformities. . Pulses:  Normal pulses noted. Extremities:  Without clubbing or edema.  Ankles are tender to exam, no obvious swelling, left greater than right Neurologic:  Alert and  oriented x4;  grossly normal neurologically. Skin:  Intact without significant lesions or rashes.. Psych:  Alert and cooperative. Normal mood and affect.  Intake/Output from previous day: 11/30 0701 - 12/01 0700 In: 652 [Blood:652] Out: -  Intake/Output this shift: No intake/output data recorded.  Lab Results: Recent Labs    10/25/24 1732 10/26/24 0437  WBC 10.8* 9.9  9.8  HGB 6.4* 9.0*  8.9*  HCT 20.5* 26.9*  26.8*  PLT 117* 103*  108*   BMET Recent Labs    10/25/24 1732 10/26/24 0437  NA 137 140  K 4.1 4.3  CL 104 105  CO2 18* 19*  GLUCOSE 114* 80  BUN 164* 156*  CREATININE 5.09* 4.55*  CALCIUM  9.5 9.3   LFT Recent Labs    10/25/24 1732  10/26/24 0437  PROT 7.0  --   ALBUMIN 3.8 3.6  AST 19  --   ALT 15  --   ALKPHOS 68  --   BILITOT 0.5  --    PT/INR Recent Labs    10/26/24 0437  LABPROT 15.2  INR 1.1   Hepatitis Panel No results for input(s): HEPBSAG, HCVAB, HEPAIGM, HEPBIGM in the last 72 hours.   IMPRESSION:  #45 78 year old African-American female admitted with 2-week history of somewhat progressive shortness of breath and fatigue, and recent outpatient labs had shown hemoglobin of 6. Baseline hemoglobin 11 Documented Hemoccult negative on admission Noted to have a macrocytic anemia and thrombocytopenia on admission Iron studies reflect a very mild iron deficiency  Patient known to have underlying chronic kidney disease which has progressed, creatinine 5.0/BUN 164 on admission  Patient has been transfused 2 units of packed RBCs and hemoglobin 8.9 today  No GI symptoms, no previous GI evaluation  I suspect that her anemia may be more on the basis of anemia of chronic disease in setting of progressive chronic kidney disease. Thrombocytopenia also suggests underlying myelodysplastic disorder.   #2 insulin -dependent diabetes mellitus #3 hypertension #4 coronary artery disease-on chronic baby aspirin  #5 hyperlipidemia  Plan; I discussed potential EGD and colonoscopy with the patient for further evaluation of her anemia/mild iron deficiency.  At this time she does not feel inclined to go through an invasive GI evaluation. Nephrology consultation is pending here Continue to monitor hemoglobin/platelets Patient could have CT scan to rule out any obvious GI malignancy but this would need to be noncontrasted so less precise. GI will be available, please call for questions  thank you  Amy Esterwood PA-C 10/26/2024, 5:32 PM

## 2024-10-26 NOTE — Assessment & Plan Note (Deleted)
 DM 2 : Fasting glucose 84 this AM. Decrease Home Lantus  to 20 units Daily. Continue CBG ACHS HTN : Hold Hydralazine  50 mg QID. Hold Doxazosin iso DBP all <52 tho systolic HTN, held Torsemide 100 mg Daily, s/p lasix  after pRBC HLD : Restart Home Lipitor 40 mg Daily  CAD : Hold home ASA 81 mg daily, Continue home Coreg  6.25 mg BID

## 2024-10-26 NOTE — Progress Notes (Deleted)
 Daily Progress Note Intern Pager: [phone]  Patient name: Kimberly Carlson Medical record number: [id number] Date of birth: 05/06/1946 Age: 78 y.o. Gender: female  Primary Care Provider: Howell Lunger, DO Consultants: none Code Status: full code  Pt Overview and Major Events to Date:  11/30 - admitted   Assessment and Plan:  78 yo with PMH of CKD stage 5 who was admitted with acute worsening of chronic anemia, suspected secondary to anemia of chronic disease with elevated ferritin and normal iron saturations. Notably her iron is half of what it was in August and on prior iron measures, this may represent an acute decrease in the setting of a GI bleed, as her subacute drop in hemoglobin does not seem entirely explained by her anemia of chronic disease. Additionally, her reticulocyte is appropriately elevated, which would be less likely if her anemia was entirely due to CKD/EPO deficiency. Assessment & Plan Acute on chronic anemia Hgb improved at 8.9 this morning after 2 pRBC. Patient still a bit lightheaded, but no SOB. Will continue to monitor clinically.  - GI consult - continue IV protonix  ISO worsened anemia - AM CBC - Add on LDH, t bili, peripheral smear to prior blood if available  Acute kidney injury superimposed on chronic kidney disease Creatinine improved. Phos elevated. Mag low.  - consult nephrology today - Mg repleted - AM RFP, Mg Leg weakness Patient reporting chronic leg weakness, RLE. Ambulates with cane. Also some chronic leg pain. - PT eval - continue gabapentin - cotninue tylenol  PRN Chronic health problem DM 2 : Fasting glucose 84 this AM. Decrease Home Lantus  to 20 units Daily. Continue CBG ACHS HTN : Hold Hydralazine  50 mg QID. Hold Doxazosin iso DBP all <52 tho systolic HTN, held Torsemide 100 mg Daily, s/p lasix  after pRBC HLD : Restart Home Lipitor 40 mg Daily  CAD : Hold home ASA 81 mg daily, Continue home Coreg  6.25 mg BID  FEN/GI:  renal PPx: held iso acute anemia Dispo:Pending PT recommendations  pending clinical improvement .    Subjective:  Patient does have some lightheadedness this morning. No SOB, CP, palpitations, abd pain. Notes bilateral ankle pain and right leg pain and weakness, no injury. Has been chronic. No melena, hematochezia, hematuria.   Objective: Temp:  [97.5 F (36.4 C)-98.6 F (37 C)] 98.6 F (37 C) (12/01 0520) Pulse Rate:  [63-74] 66 (12/01 0520) Resp:  [12-18] 17 (12/01 0520) BP: (114-158)/(44-53) 135/45 (12/01 0520) SpO2:  [97 %-100 %] 99 % (12/01 0520) Weight:  [77.2 kg-80.3 kg] 77.2 kg (12/01 0500)  Physical Exam: General: well appearing woman sitting up in bed Cardiovascular: RRR, early systolic murmur Respiratory: CTAB Abdomen: soft, nontender Extremities: diffuse tenderness in the bilateral ankles and shins with any light palpation, trace edema in the LLE, no edema in the RLE.   Laboratory: Most recent CBC Lab Results  Component Value Date   WBC 9.8 10/26/2024   HGB 8.9 (L) 10/26/2024   HCT 26.8 (L) 10/26/2024   MCV 92.4 10/26/2024   PLT 108 (L) 10/26/2024   Most recent BMP    Latest Ref Rng & Units 10/26/2024    4:37 AM  BMP  Glucose 70 - 99 mg/dL 80   BUN 8 - 23 mg/dL 843   Creatinine 9.55 - 1.00 mg/dL 5.44   Sodium 864 - 854 mmol/L 140   Potassium 3.5 - 5.1 mmol/L 4.3   Chloride 98 - 111 mmol/L 105   CO2 22 - 32  mmol/L 19   Calcium  8.9 - 10.3 mg/dL 9.3    Phos 5.9 Magnesium 1.5   Imaging/Diagnostic Tests: None  Alena Morrison, [place], MD 10/26/2024, 7:26 AM  PGY-1, [facility] Health Family Medicine FPTS Intern pager: [phone], text pages welcome Secure chat group [facility] Teaching Service

## 2024-10-26 NOTE — Care Management Obs Status (Signed)
 MEDICARE OBSERVATION STATUS NOTIFICATION   Patient Details  Name: Kimberly Carlson MRN: [id number] Date of Birth: 11-13-1946   Medicare Observation Status Notification Given:  Yes  Obs notice signed and copy given  Claretta Deed 10/26/2024, 11:35 AM

## 2024-10-26 NOTE — Assessment & Plan Note (Deleted)
 Creatinine improved. Phos elevated. Mag low.  - consult nephrology today - Mg repleted - AM RFP, Mg

## 2024-10-26 NOTE — Care Management Obs Status (Signed)
 MEDICARE OBSERVATION STATUS NOTIFICATION   Patient Details  Name: Kimberly Carlson MRN: [id number] Date of Birth: 02-Feb-1946   Medicare Observation Status Notification Given:       Claretta Deed 10/26/2024, 11:29 AM

## 2024-10-26 NOTE — Assessment & Plan Note (Deleted)
 Patient reporting chronic leg weakness, RLE. Ambulates with cane. Also some chronic leg pain. - PT eval - continue gabapentin - cotninue tylenol  PRN

## 2024-10-27 ENCOUNTER — Inpatient Hospital Stay (HOSPITAL_COMMUNITY)

## 2024-10-27 DIAGNOSIS — D649 Anemia, unspecified: Secondary | ICD-10-CM | POA: Diagnosis not present

## 2024-10-27 LAB — CBC
HCT: 27.9 % — ABNORMAL LOW (ref 36.0–46.0)
Hemoglobin: 9.3 g/dL — ABNORMAL LOW (ref 12.0–15.0)
MCH: 30.8 pg (ref 26.0–34.0)
MCHC: 33.3 g/dL (ref 30.0–36.0)
MCV: 92.4 fL (ref 80.0–100.0)
Platelets: 115 K/uL — ABNORMAL LOW (ref 150–400)
RBC: 3.02 MIL/uL — ABNORMAL LOW (ref 3.87–5.11)
RDW: 15.6 % — ABNORMAL HIGH (ref 11.5–15.5)
WBC: 11.7 K/uL — ABNORMAL HIGH (ref 4.0–10.5)
nRBC: 0 % (ref 0.0–0.2)

## 2024-10-27 LAB — RENAL FUNCTION PANEL
Albumin: 3.6 g/dL (ref 3.5–5.0)
Anion gap: 12 (ref 5–15)
BUN: 158 mg/dL — ABNORMAL HIGH (ref 8–23)
CO2: 21 mmol/L — ABNORMAL LOW (ref 22–32)
Calcium: 9.3 mg/dL (ref 8.9–10.3)
Chloride: 106 mmol/L (ref 98–111)
Creatinine, Ser: 4.72 mg/dL — ABNORMAL HIGH (ref 0.44–1.00)
GFR, Estimated: 9 mL/min — ABNORMAL LOW (ref >60)
Glucose, Bld: 178 mg/dL — ABNORMAL HIGH (ref 70–99)
Phosphorus: 5.6 mg/dL — ABNORMAL HIGH (ref 2.5–4.6)
Potassium: 4.6 mmol/L (ref 3.5–5.1)
Sodium: 139 mmol/L (ref 135–145)

## 2024-10-27 LAB — GLUCOSE, CAPILLARY
Glucose-Capillary: 102 mg/dL — ABNORMAL HIGH (ref 70–99)
Glucose-Capillary: 133 mg/dL — ABNORMAL HIGH (ref 70–99)
Glucose-Capillary: 148 mg/dL — ABNORMAL HIGH (ref 70–99)
Glucose-Capillary: 145 mg/dL — ABNORMAL HIGH (ref 70–99)

## 2024-10-27 LAB — MAGNESIUM: Magnesium: 1.9 mg/dL (ref 1.7–2.4)

## 2024-10-27 LAB — HEPATITIS B SURFACE ANTIGEN: Hepatitis B Surface Ag: NONREACTIVE

## 2024-10-27 MED ORDER — ANTICOAGULANT SODIUM CITRATE 4% (200MG/5ML) IV SOLN: 5.0000 mL | Status: DC | PRN

## 2024-10-27 MED ORDER — LIDOCAINE-EPINEPHRINE (PF) 1 %-1:200000 IJ SOLN
20.0000 mL | Freq: Once | INTRAMUSCULAR | Status: AC
  Administered 2024-10-27: 20 mL
  Filled 2024-10-27: qty 20

## 2024-10-27 MED ORDER — FENTANYL CITRATE (PF) 100 MCG/2ML IJ SOLN
INTRAMUSCULAR | Status: AC
  Filled 2024-10-27: qty 2

## 2024-10-27 MED ORDER — LIDOCAINE HCL (PF) 1 % IJ SOLN: 5.0000 mL | INTRAMUSCULAR | Status: DC | PRN

## 2024-10-27 MED ORDER — MIDAZOLAM HCL 2 MG/2ML IJ SOLN
INTRAMUSCULAR | Status: AC
  Filled 2024-10-27: qty 2

## 2024-10-27 MED ORDER — LIDOCAINE-EPINEPHRINE 1 %-1:100000 IJ SOLN
INTRAMUSCULAR | Status: AC
  Filled 2024-10-27: qty 1

## 2024-10-27 MED ORDER — PANTOPRAZOLE SODIUM 40 MG PO TBEC
40.0000 mg | DELAYED_RELEASE_TABLET | Freq: Two times a day (BID) | ORAL | Status: DC
  Administered 2024-10-27 – 2024-10-30 (×6): 40 mg via ORAL
  Filled 2024-10-27 (×6): qty 1

## 2024-10-27 MED ORDER — POLYETHYLENE GLYCOL 3350 17 G PO PACK
17.0000 g | PACK | Freq: Every day | ORAL | Status: DC
  Administered 2024-10-27: 17 g via ORAL
  Filled 2024-10-27 (×4): qty 1

## 2024-10-27 MED ORDER — FENTANYL CITRATE (PF) 100 MCG/2ML IJ SOLN
INTRAMUSCULAR | Status: AC | PRN
  Administered 2024-10-27: 25 ug via INTRAVENOUS

## 2024-10-27 MED ORDER — GELATIN ABSORBABLE 12-7 MM EX MISC
CUTANEOUS | Status: AC
  Filled 2024-10-27: qty 1

## 2024-10-27 MED ORDER — VANCOMYCIN HCL IN DEXTROSE 1-5 GM/200ML-% IV SOLN
1000.0000 mg | Freq: Once | INTRAVENOUS | Status: AC
  Administered 2024-10-27: 1000 mg via INTRAVENOUS
  Filled 2024-10-27: qty 200

## 2024-10-27 MED ORDER — ASPIRIN 81 MG PO CHEW
81.0000 mg | CHEWABLE_TABLET | Freq: Every day | ORAL | Status: DC
  Administered 2024-10-28 – 2024-10-30 (×3): 81 mg via ORAL
  Filled 2024-10-27 (×3): qty 1

## 2024-10-27 MED ORDER — HEPARIN SODIUM (PORCINE) 1000 UNIT/ML IJ SOLN
3200.0000 [IU] | Freq: Once | INTRAMUSCULAR | Status: AC
  Administered 2024-10-27: 3200 [IU]

## 2024-10-27 MED ORDER — MIDAZOLAM HCL (PF) 2 MG/2ML IJ SOLN
INTRAMUSCULAR | Status: AC | PRN
  Administered 2024-10-27: 1 mg via INTRAVENOUS

## 2024-10-27 MED ORDER — CHLORHEXIDINE GLUCONATE CLOTH 2 % EX PADS
6.0000 | MEDICATED_PAD | Freq: Every day | CUTANEOUS | Status: DC
  Administered 2024-10-28 – 2024-10-30 (×3): 6 via TOPICAL

## 2024-10-27 MED ORDER — LIDOCAINE 5 % EX PTCH
2.0000 | MEDICATED_PATCH | CUTANEOUS | Status: DC
  Administered 2024-10-27 – 2024-10-30 (×3): 2 via TRANSDERMAL
  Filled 2024-10-27 (×3): qty 2

## 2024-10-27 MED ORDER — ONDANSETRON 4 MG PO TBDP
4.0000 mg | ORAL_TABLET | Freq: Once | ORAL | Status: AC
  Administered 2024-10-27: 4 mg via ORAL
  Filled 2024-10-27: qty 1

## 2024-10-27 MED ORDER — GELATIN ABSORBABLE 12-7 MM EX MISC
1.0000 | Freq: Once | CUTANEOUS | Status: AC
  Administered 2024-10-27: 1
  Filled 2024-10-27: qty 1

## 2024-10-27 MED ORDER — PENTAFLUOROPROP-TETRAFLUOROETH EX AERO: 1.0000 | INHALATION_SPRAY | CUTANEOUS | Status: DC | PRN

## 2024-10-27 MED ORDER — ALTEPLASE 2 MG IJ SOLR: 2.0000 mg | Freq: Once | INTRAMUSCULAR | Status: DC | PRN

## 2024-10-27 MED ORDER — SENNA 8.6 MG PO TABS
1.0000 | ORAL_TABLET | Freq: Every day | ORAL | Status: DC
  Administered 2024-10-27: 8.6 mg via ORAL
  Filled 2024-10-27 (×4): qty 1

## 2024-10-27 MED ORDER — LIDOCAINE-PRILOCAINE 2.5-2.5 % EX CREA: 1.0000 | TOPICAL_CREAM | CUTANEOUS | Status: DC | PRN

## 2024-10-27 MED ORDER — HEPARIN SODIUM (PORCINE) 1000 UNIT/ML IJ SOLN
INTRAMUSCULAR | Status: AC
  Filled 2024-10-27: qty 10

## 2024-10-27 MED ORDER — HEPARIN SODIUM (PORCINE) 1000 UNIT/ML DIALYSIS: 1000.0000 [IU] | INTRAMUSCULAR | Status: DC | PRN

## 2024-10-27 MED ORDER — HEPARIN SODIUM (PORCINE) 5000 UNIT/ML IJ SOLN
5000.0000 [IU] | Freq: Three times a day (TID) | INTRAMUSCULAR | Status: DC
  Administered 2024-10-27 – 2024-10-29 (×4): 5000 [IU] via SUBCUTANEOUS
  Filled 2024-10-27 (×5): qty 1

## 2024-10-27 NOTE — Discharge Instructions (Addendum)
 Thank you for letting us  care for you during your stay.  You were admitted to the [facility] Medicine Teaching Service.   You were admitted for symptomatic anemia, which is low red blood cells. You were given a blood transfusion and started on a proton pump inhibitor medication in case the cause of your anemia was from a GI bleed. The GI doctors offered a colonoscopy and endoscopy to evaluate for a GI bleed, but since you are not interested in pursuing that, we held off on looking into the GI bleed. Nephrology was consulted and discussed starting a weekly medication to stimulate your kidneys to make more red blood cells. Outpatient, you have taken one of these medications before. We recommend talking with your outpatient nephrologist about restarting this, an erythropoiesis-stimulating agent. Your hemoglobin/red blood cells remained improved after the blood transfusions and restarting your aspirin .  You also had a tunneled dialysis catheter placed during this admission and were started on hemodialysis. Please follow up with your nephrologist outpatient for further management of your dialysis.  During this admission, you had bilateral, burning leg pain. Likely this is neuropathic pain, based on your exam and pain description. We increased your gabapentin  dose, started cymbalta , scheduled tylenol , and added multiple topical pain medicines to help. We recommend following up with your PCP for continued pain management.   Please follow up with your primary care physician in 1 week.   If your symptoms worsen or return, please return to the hospital.  Please let us  know if you have questions about your stay at [facility].

## 2024-10-27 NOTE — Progress Notes (Signed)
 New Dialysis Start    Patient identified as new dialysis start. Kidney Education packet assembled and given. Patient wanted to reschedule our discussion for tomorrow.    Will return to visit patient at bedside as available.  Alfonso Heckler Dialysis Nurse Coordinator [phone] Secure Chat Available

## 2024-10-27 NOTE — Progress Notes (Signed)
     Daily Progress Note Intern Pager: [phone]  Patient name: Kimberly Carlson Medical record number: [id number] Date of birth: 11/05/1946 Age: 78 y.o. Gender: female  Primary Care Provider: Howell Lunger, DO Consultants: nephrology, GI (s/o) Code Status: full   Pt Overview and Major Events to Date:  11/30 - admitted  12/1 - nephrology and GI consulted  Medical Decision Making:  78 yo with PMH of CKD stage 5 who was admitted with acute worsening of chronic anemia, suspected secondary to anemia of chronic disease and potential GI source; however, patient has declined further evaluation of GI source of bleed. Nephrology consulted and plan to start ESA and dialysis.  Assessment & Plan Acute on chronic anemia Hgb stable this morning. Peripheral blood smear (on blood prior to transfusion) normal. Patient declined GI evaluation. Nephrology plans to start ESA - AM CBC - start PO protonix  40 mg BID - ESA per nephrology, weekly, first dose 12/1 Acute kidney injury superimposed on chronic kidney disease - nephrology following, appreciate recs  - HD today after temp catheter placement - AM RFP, Mg Chronic health problem Will start scheduled bowel regimen as no BM since Saturday, per patient report DM 2 : Glucose yesterday 84-178. Received 28 units lantus . Today begins the decreased dose of Home Lantus  to 20 units. Continue CBG ACHS HTN : Continue Hold Hydralazine  50 mg QID and Doxazosin  iso Diastolic hypotension overnight, improved this AM, holding Torsemide  100 mg Daily HLD : continue Home Lipitor 40 mg Daily  CAD : Restart home ASA 81 mg daily, Continue home Coreg  6.25 mg BID Leg weakness Appreciate PT insight. Recs for [facility] PT. - lidocaine  patches for bilateral ankle pain   FEN/GI: NPO for temp cath PPx: none, start [place] heparin  Dispo:Home with home health pending clinical improvement .   Subjective:  Patient with persistent ankle pain, otherwise feeling well. Last BM Saturday.  NAEO  Objective: Temp:  [97.5 F (36.4 C)-98.6 F (37 C)] 98.4 F (36.9 C) (12/02 0426) Pulse Rate:  [63-67] 65 (12/02 0426) Resp:  [15-20] 20 (12/02 0426) BP: (114-138)/(37-64) 138/64 (12/02 0426) SpO2:  [98 %-100 %] 98 % (12/02 0426) Weight:  [75 kg] 75 kg (12/02 0429) Physical Exam: General: well appearing woman sitting up in bed Cardiovascular: RRR Respiratory: no increased WOB Abdomen: soft, nontender, nondistended Extremities: diffuse tenderness over the BLE  Laboratory: Most recent CBC Lab Results  Component Value Date   WBC 11.7 (H) 10/27/2024   HGB 9.3 (L) 10/27/2024   HCT 27.9 (L) 10/27/2024   MCV 92.4 10/27/2024   PLT 115 (L) 10/27/2024   Most recent BMP    Latest Ref Rng & Units 10/27/2024    1:21 AM  BMP  Glucose 70 - 99 mg/dL 821   BUN 8 - 23 mg/dL 841   Creatinine 9.55 - 1.00 mg/dL 5.27   Sodium 864 - 854 mmol/L 139   Potassium 3.5 - 5.1 mmol/L 4.6   Chloride 98 - 111 mmol/L 106   CO2 22 - 32 mmol/L 21   Calcium  8.9 - 10.3 mg/dL 9.3    Phos 5.6 Mag 1.9  Imaging/Diagnostic Tests: None  Alena Morrison, [place], MD 10/27/2024, 7:24 AM  PGY-1, [place] [facility] FPTS Intern pager: [phone], text pages welcome Secure chat group [facility] Teaching Service

## 2024-10-27 NOTE — Assessment & Plan Note (Addendum)
 Appreciate PT insight. Recs for [facility] PT. - lidocaine  patches for bilateral ankle pain

## 2024-10-27 NOTE — Assessment & Plan Note (Signed)
-   nephrology following, appreciate recs  - HD today after temp catheter placement - AM RFP, Mg

## 2024-10-27 NOTE — Progress Notes (Addendum)
 [facility] Progress Note  Name: Kimberly Carlson MRN: [id number] DOB: September 30, 1946  Chief Complaint:  Shortness of breath and weakness  Subjective:  She had a tunneled dialysis catheter placed on 12/2 with IR.  There is no urine output charted over 12/1. She states that she was already started back on retacrit  outpatient and declined the dose yesterday because she got one at the end of November (retacrit  10,000 units on 10/20/24).  Spoke with daughter and grandson at bedside.  She states HD went ok today - first tx earlier today.  She is still considering home options.    Review of systems:  Denies shortness of breath or chest pain  Denies n/v States she is working with PT She has had some lower leg tenderness  ------------------- Background on consult:  Kimberly Carlson is a 78 y.o. female with a history of CKD stage V, diabetes mellitus, distant cocaine use, and hypertension who presented to the hospital with shortness of breath, weakness, and fatigue.  She was found to have acute on chronic anemia and is suspected to have a component of CKD contributing to her anemia.  They have also consulted GI due to concern for GI bleed.  Team is requesting input specifically on whether or not she would be appropriate for ESA.  Hemoglobin was 6.4 yesterday and she received 2 units of packed red blood cells.  For reference, the patient follows with Dr. Tobie outpatient and on 09/23/2024, her creatinine was 3.96 and BUN was 100.  At that appointment, she deferred a referral to [facility].  She was reported to be prescribed Lasix  40 mg twice a day but was just taking it daily per his note.  She was previously on ESA outpatient through our office however Dr. Anthony note indicates that this had been held as her hemoglobin had improved to greater than 11.  She had one unmeasured urine void over 11/30 charted.  Nephrology is consulted for assistance with management of advanced CKD and anemia.  She  denies any blood per rectum or dark stools.  She has recently used a cane to walk around but prior to feeling poorly she did not even need this.  She and I discussed the risks, benefits, and indications for dialysis and she does consent to dialysis.  She had been hoping to avoid dialysis.  She was worried about how she would get back and forth to dialysis and I let her know there are programs that offer assistance with this.  She asks if it is possible to do dialysis at home.  We discussed types of dialysis.      Intake/Output Summary (Last 24 hours) at 10/27/2024 1658 Last data filed at 10/27/2024 1559 Gross per 24 hour  Intake 236 ml  Output 0 ml  Net 236 ml    Vitals:  Vitals:   10/27/24 1530 10/27/24 1555 10/27/24 1559 10/27/24 1639  BP:  (!) 116/57 (!) 122/46 (!) 114/48  Pulse: 71 69 69 73  Resp: 11 15 14 20   Temp:   97.7 F (36.5 C) 99.6 F (37.6 C)  TempSrc:    Oral  SpO2: 99% 99% 99% 100%  Weight:      Height:         Physical Exam:  General elderly female in bed in no acute distress HEENT normocephalic atraumatic extraocular movements intact sclera anicteric Neck supple trachea midline Lungs clear to auscultation bilaterally normal work of breathing at rest on room air Heart S1S2 no  rub Abdomen soft nontender nondistended Extremities no edema appreciated but legs are tender Psych normal mood and affect Neuro alert and oriented x 3 provides hx and follows commands  Access RIJ tunneled catheter    Medications reviewed   Labs:     Latest Ref Rng & Units 10/27/2024    1:21 AM 10/26/2024    4:37 AM 10/25/2024    5:32 PM  BMP  Glucose 70 - 99 mg/dL 821  80  885   BUN 8 - 23 mg/dL 841  843  835   Creatinine 0.44 - 1.00 mg/dL 5.27  5.44  4.90   Sodium 135 - 145 mmol/L 139  140  137   Potassium 3.5 - 5.1 mmol/L 4.6  4.3  4.1   Chloride 98 - 111 mmol/L 106  105  104   CO2 22 - 32 mmol/L 21  19  18    Calcium  8.9 - 10.3 mg/dL 9.3  9.3  9.5      Assessment/Plan:     # CKD stage V with progression to ESRD - started HD on 12/2 after tunneled catheter with IR - Plan for HD on 12/3 then assess needs daily - Note BUN was 100 on 09/23/24 - Would stop spironolactone   - Strict ins/outs and daily weights are ordered - requested that she be CLIP'd for an outpatient HD unit - TCU would be good as she would consider dialysis at home, perhaps PD - She is wondering about transportation to dialysis and PD may be a good fit for her for this reason - dialysis educator consulted.    # Anemia - acute on chronic - concern for acute blood loss and also with contribution from CKD  - She declined ESA stating that she had recently received ESA outpatient.   - PRBC's per primary team  - appreciate GI  - she is on a PPI   # HTN  - acceptable control - avoid hypotension    # Metabolic acidosis - Starting HD  - trend renal panel    Disposition - continue inpatient monitoring    Katheryn JAYSON Saba, MD 10/27/2024 5:20 PM

## 2024-10-27 NOTE — Consult Note (Addendum)
 Chief Complaint: CKD. Request is for tunneled HD catheter placement  Referring Physician(s): Dr. Katheryn Saba  Supervising Physician: Vanice Revel  Patient Status: [facility] - In-pt  History of Present Illness: Kimberly Carlson is a 78 y.o. female  inpatient. History of DM, HTN, CKD. . Presented to the ED at [facility] on 11.20.25 with generalized weakness and SHOB X 2 weeks. Found to have acute anemia in the setting of CKD. Team is requesting a tunneled hemodialysis catheter for hemodialysis access.   Patient alert and laying in bed,calm. Endorses left ankle pain.  Denies any fevers, headache, chest pain, SOB, cough, abdominal pain, nausea, vomiting or bleeding.   WBC is 11.7, BUN 158, Cr 4.72. Hgb 7.8. All medications are within acceptable parameters. Allergies include Augmentin , doxycycline . Patient has been NPO since midnight.   Return precautions and treatment recommendations and follow-up discussed with the patient  who is agreeable with the plan.     Past Medical History:  Diagnosis Date   Acute cystitis without hematuria 04/07/2020   Back pain    herniated disc lower back   Cocaine use 06/08/2015   UDS 2013     Diabetes mellitus without complication (HCC)    Frequent nosebleeds 09/15/2021   Hematuria, undiagnosed cause 06/09/2015   HERNIATED LUMBAR DISC 05/14/2007   Qualifier: Diagnosis of By: Rosalynn MD, Sara   MRI on 05/05/2007: small soft disk herniation L2-3. Slight mass effect left L2 nerve root. L5-S1 arthritis with narrowing of the Right lateral recess.      Hypertension    Vertigo 07/16/2019    Past Surgical History:  Procedure Laterality Date   ABDOMINAL HYSTERECTOMY      Allergies: Amlodipine , Augmentin  [amoxicillin -pot clavulanate], Clonidine  derivatives, Doxycycline , and Metformin  Medications: Prior to Admission medications   Medication Sig Start Date End Date Taking? Authorizing Provider  acetaminophen  (TYLENOL ) 500 MG tablet Take 1,000 mg by mouth every  6 (six) hours as needed for mild pain (pain score 1-3) (or headaches).   Yes [provider]  aspirin  81 MG chewable tablet Chew 81 mg by mouth daily.   Yes [provider]  atorvastatin  (LIPITOR) 40 MG tablet TAKE 1 TABLET EVERY DAY Patient taking differently: Take 40 mg by mouth at bedtime. 06/15/24  Yes Howell Lunger, DO  carvedilol  (COREG ) 25 MG tablet Take 25 mg by mouth 2 (two) times daily with a meal.   Yes [provider]  Cholecalciferol  (VITAMIN D3) 125 MCG (5000 UT) CAPS Take 5,000 Units by mouth daily.   Yes [provider]  doxazosin (CARDURA) 4 MG tablet Take 6 mg by mouth at bedtime.   Yes [provider]  gabapentin (NEURONTIN) 100 MG capsule Take 100 mg by mouth at bedtime.   Yes [provider]  hydrALAZINE  (APRESOLINE ) 100 MG tablet Take 100 mg by mouth See admin instructions. Take 100 mg by mouth in the morning and at 3 PM   Yes [provider]  insulin  glargine (LANTUS ) 100 UNIT/ML injection INJECT 28 UNITS UNDER THE SKIN EVERY DAY (DISCARD VIAL 28 DAYS AFTER OPENING) NEED MD APPOINTMENT FOR REFILLS Patient taking differently: Inject 28 Units into the skin daily before breakfast. 05/26/24  Yes Howell Lunger, DO  spironolactone  (ALDACTONE ) 50 MG tablet Take 50 mg by mouth daily.   Yes [provider]  torsemide (DEMADEX) 100 MG tablet Take 100 mg by mouth in the morning.   Yes [provider]  ACCU-CHEK GUIDE TEST test strip USE TO CHECK BLOOD SUGAR  SIX TIMES PER DAY 06/15/24   Howell Lunger, DO  Accu-Chek Softclix Lancets lancets USE TO CHECK BLOOD SUGAR 6 TIMES PER DAY 05/26/24   Howell Lunger, DO  Blood Glucose Monitoring Suppl (ACCU-CHEK GUIDE ME) w/Device KIT Use to check blood sugar 6x per day. E11.65 01/12/22   Rosalynn Camie CROME, MD  calcium -vitamin D  (OSCAL 500/200 D-3) 500-200 MG-UNIT tablet Take 2 tablets by mouth daily with breakfast. Patient not taking: Reported on 10/25/2024 02/27/16   Alto Lynwood SAUNDERS, MD  carvedilol  (COREG ) 6.25 MG tablet Take 4 tablets (25 mg total) by mouth 2 (two) times daily. Patient not taking: Reported on 10/25/2024 07/03/18 10/25/24  Riccio, Angela C, DO  furosemide  (LASIX ) 40 MG tablet Take 1 tablet (40 mg total) by mouth daily. Patient not taking: Reported on 10/25/2024 05/28/24   Howell Lunger, DO  Insulin  Syringe-Needle U-100 (DROPLET INSULIN  SYRINGE) 31G X 5/16 1 ML MISC Inject 1 Lancet into the skin daily. 05/26/24   Howell Lunger, DO     Family History  Adopted: Yes    Social History   Socioeconomic History   Marital status: Widowed    Spouse name: Not on file   Number of children: Not on file   Years of education: Not on file   Highest education level: GED or equivalent  Occupational History   Not on file  Tobacco Use   Smoking status: Former    Current packs/day: 0.00    Average packs/day: 1 pack/day for 1.2 years (1.2 ttl pk-yrs)    Types: Cigarettes    Start date: 04/26/2014    Quit date: 07/23/2015    Years since quitting: 9.2   Smokeless tobacco: Never   Tobacco comments:    Started in her 21's with multiple quit attempts for about 1 year at a time.  Vaping Use   Vaping status: Never Used  Substance and Sexual Activity   Alcohol use: No   Drug use: No   Sexual activity: Not on file  Other Topics Concern   Not on file  Social History Narrative   Not on file   Social Drivers of Health   Financial Resource Strain: Low Risk  (06/23/2024)   Overall Financial Resource Strain (CARDIA)    Difficulty of Paying Living Expenses: Not hard at all  Food Insecurity: No Food Insecurity (10/25/2024)   Hunger Vital Sign    Worried About Running Out of Food in the Last Year: Never true    Ran Out of Food in the Last Year: Never true  Transportation Needs: No Transportation Needs (10/25/2024)   PRAPARE - Administrator, Civil Service (Medical): No    Lack of Transportation (Non-Medical): No  Physical Activity: Insufficiently  Active (06/23/2024)   Exercise Vital Sign    Days of Exercise per Week: 7 days    Minutes of Exercise per Session: 20 min  Stress: No Stress Concern Present (06/23/2024)   [employer] of Occupational Health - Occupational Stress Questionnaire    Feeling of Stress: Not at all  Social Connections: Moderately Isolated (10/25/2024)   Social Connection and Isolation Panel    Frequency of Communication with Friends and Family: More than three times a week    Frequency of Social Gatherings with Friends and Family: Three times a week    Attends Religious Services: Never    Active Member of Clubs or Organizations: No    Attends Engineer, Structural: More than 4 times per year  Marital Status: Widowed     Review of Systems: A 12 point ROS discussed and pertinent positives are indicated in the HPI above.  All other systems are negative.  Review of Systems  Constitutional:  Negative for fatigue and fever.  HENT:  Negative for congestion.   Respiratory:  Negative for cough and shortness of breath.   Gastrointestinal:  Negative for abdominal pain, diarrhea, nausea and vomiting.  Musculoskeletal:  Positive for arthralgias (left ankle pain).    Vital Signs: BP 138/64 (BP Location: Left Arm)   Pulse 65   Temp 98.4 F (36.9 C) (Oral)   Resp 20   Ht 5' 3 (1.6 m)   Wt 165 lb 5.5 oz (75 kg)   LMP  (LMP Unknown)   SpO2 98%   BMI 29.29 kg/m   Advance Care Plan: The advanced care plan/surrogate decision maker was discussed at the time of visit and the patient did not wish to discuss or was not able to name a surrogate decision maker or provide an advance care plan.    Physical Exam Vitals and nursing note reviewed.  Constitutional:      Appearance: She is well-developed.  HENT:     Head: Normocephalic and atraumatic.  Eyes:     Conjunctiva/sclera: Conjunctivae normal.  Pulmonary:     Effort: Pulmonary effort is normal.  Musculoskeletal:        General: Normal range of  motion.     Cervical back: Normal range of motion.  Skin:    General: Skin is warm.  Neurological:     Mental Status: She is alert and oriented to person, place, and time.     Imaging: No results found.  Labs:  CBC: Recent Labs    06/30/24 1340 06/30/24 1343 10/20/24 1333 10/25/24 1732 10/26/24 0437 10/27/24 0121  WBC 8.6  --   --  10.8* 9.9  9.8 11.7*  HGB 11.1*   < > 6.0* 6.4* 9.0*  8.9* 9.3*  HCT 36.5  --   --  20.5* 26.9*  26.8* 27.9*  PLT 131*  --   --  117* 103*  108* 115*   < > = values in this interval not displayed.    COAGS: Recent Labs    10/26/24 0437  INR 1.1    BMP: Recent Labs    06/30/24 1340 10/25/24 1732 10/26/24 0437 10/27/24 0121  NA 140 137 140 139  K 4.7 4.1 4.3 4.6  CL 113* 104 105 106  CO2 18* 18* 19* 21*  GLUCOSE 84 114* 80 178*  BUN 81* 164* 156* 158*  CALCIUM  9.4 9.5 9.3 9.3  CREATININE 3.25* 5.09* 4.55* 4.72*  GFRNONAA 14* 8* 9* 9*    LIVER FUNCTION TESTS: Recent Labs    06/30/24 1340 10/25/24 1732 10/26/24 0437 10/27/24 0121  BILITOT  --  0.5  --   --   AST  --  19  --   --   ALT  --  15  --   --   ALKPHOS  --  68  --   --   PROT  --  7.0  --   --   ALBUMIN 3.6 3.8 3.6 3.6    TUMOR MARKERS: No results for input(s): AFPTM, CEA, CA199, CHROMGRNA in the last 8760 hours.  Assessment and Plan:  78 y.o. female inpatient. History of DM, HTN, CKD. Presented to the ED at [facility] on 11.20.25 with generalized weakness and SHOB X 2 weeks. Found to have acute  anemia in the setting of CKD. Team is requesting a tunneled hemodialysis catheter for hemodialysis access.   PLAN: IR Image Guided Tunneled HD Catheter Placement  Risks and benefits discussed with the patient including, but not limited to bleeding, infection, vascular injury, pneumothorax which may require chest tube placement, air embolism or even death  All of the patient's questions were answered, patient is agreeable to proceed. Consent signed and in  chart.  Thank you for this interesting consult.  I greatly enjoyed meeting Dekisha E Hardman and look forward to participating in their care.  A copy of this report was sent to the requesting provider on this date.  Electronically Signed: Delon JAYSON Beagle, NP 10/27/2024, 7:17 AM   I spent a total of 20 Minutes    in face to face in clinical consultation, greater than 50% of which was counseling/coordinating care for [facility] catheter placement.

## 2024-10-27 NOTE — Assessment & Plan Note (Addendum)
 Will start scheduled bowel regimen as no BM since Saturday, per patient report DM 2 : Glucose yesterday 84-178. Received 28 units lantus . Today begins the decreased dose of Home Lantus  to 20 units. Continue CBG ACHS HTN : Continue Hold Hydralazine  50 mg QID and Doxazosin  iso Diastolic hypotension overnight, improved this AM, holding Torsemide  100 mg Daily HLD : continue Home Lipitor 40 mg Daily  CAD : Restart home ASA 81 mg daily, Continue home Coreg  6.25 mg BID

## 2024-10-27 NOTE — Progress Notes (Signed)
 Physical Therapy Treatment Patient Details Name: Kimberly Carlson MRN: [id number] DOB: February 05, 1946 Today's Date: 10/27/2024   History of Present Illness 78 yo female present with weakness and SOB hbg 6.0 pt with workup for GIB.   PMH CKD V chronic anemia, DM2, HTN, HLD, CAD.    PT Comments  Pt received in supine and agreeable to session. Pt reports increased L ankle pain in WB initially, but improves with progressed distance. Pt requests to use the bathroom and is able to complete pericare in standing and hand hygiene with up to CGA for safety. After a seated rest pt able to stand and perform static standing marches with focus on increased foot clearance with pt demonstrating increased difficulty lifting RLE due to L ankle pain. Pt continues to benefit from PT services to progress toward functional mobility goals.     If plan is discharge home, recommend the following: A little help with walking and/or transfers;A little help with bathing/dressing/bathroom;Help with stairs or ramp for entrance   Can travel by private vehicle        Equipment Recommendations  None recommended by PT    Recommendations for Other Services       Precautions / Restrictions Precautions Precautions: Fall Recall of Precautions/Restrictions: Intact Restrictions Weight Bearing Restrictions Per Provider Order: No     Mobility  Bed Mobility Overal bed mobility: Modified Independent             General bed mobility comments: increased time and use of bed features    Transfers Overall transfer level: Needs assistance Equipment used: Rolling walker (2 wheels) Transfers: Sit to/from Stand Sit to Stand: Contact guard assist, Min assist           General transfer comment: From EOB, toilet, and recliner with CGA for safety. Light min A from EOB for anterior weight shift. Reliance on UE support for power up    Ambulation/Gait Ambulation/Gait assistance: Contact guard assist Gait Distance (Feet):  10 Feet (+75) Assistive device: Rolling walker (2 wheels) Gait Pattern/deviations: Step-through pattern, Decreased stride length, Trunk flexed, Decreased weight shift to left       General Gait Details: Pt demonstrates slow gait with low foot clearance R>L with R foot sliding forward intermittently requiring cues for increased clearance.   Stairs             Wheelchair Mobility     Tilt Bed    Modified Rankin (Stroke Patients Only)       Balance Overall balance assessment: Needs assistance   Sitting balance-Leahy Scale: Good     Standing balance support: Bilateral upper extremity supported, During functional activity, Reliant on assistive device for balance Standing balance-Leahy Scale: Fair Standing balance comment: reliant on RW support dynamically for LLE offloading                            Communication Communication Communication: No apparent difficulties  Cognition Arousal: Alert Behavior During Therapy: WFL for tasks assessed/performed   PT - Cognitive impairments: No apparent impairments                         Following commands: Intact      Cueing Cueing Techniques: Verbal cues, Tactile cues  Exercises General Exercises - Lower Extremity Hip Flexion/Marching: AROM, Standing, Both, 10 reps    General Comments        Pertinent Vitals/Pain Pain Assessment Pain Assessment: 0-10 Pain  Score: 5  Pain Location: L ankle Pain Descriptors / Indicators: Aching, Guarding Pain Intervention(s): Limited activity within patient's tolerance, Monitored during session, Repositioned     PT Goals (current goals can now be found in the care plan section) Acute Rehab PT Goals Patient Stated Goal: return to independent PT Goal Formulation: With patient/family Time For Goal Achievement: 11/09/24 Progress towards PT goals: Progressing toward goals    Frequency    Min 2X/week       AM-PAC PT 6 Clicks Mobility   Outcome  Measure  Help needed turning from your back to your side while in a flat bed without using bedrails?: None Help needed moving from lying on your back to sitting on the side of a flat bed without using bedrails?: None Help needed moving to and from a bed to a chair (including a wheelchair)?: A Little Help needed standing up from a chair using your arms (e.g., wheelchair or bedside chair)?: A Little Help needed to walk in hospital room?: A Little Help needed climbing 3-5 steps with a railing? : A Little 6 Click Score: 20    End of Session Equipment Utilized During Treatment: Gait belt Activity Tolerance: Patient tolerated treatment well Patient left: with call bell/phone within reach;in chair;with nursing/sitter in room Nurse Communication: Mobility status PT Visit Diagnosis: Other abnormalities of gait and mobility (R26.89);Muscle weakness (generalized) (M62.81)     Time: 9075-9060 PT Time Calculation (min) (ACUTE ONLY): 15 min  Charges:    $Gait Training: 8-22 mins PT General Charges $$ ACUTE PT VISIT: 1 Visit                    [name], PTA Acute Rehabilitation Services Secure Chat Preferred  [phone]    [name] 10/27/2024, 10:10 AM

## 2024-10-27 NOTE — Assessment & Plan Note (Addendum)
 Hgb stable this morning. Peripheral blood smear (on blood prior to transfusion) normal. Patient declined GI evaluation. Nephrology plans to start ESA - AM CBC - start PO protonix  40 mg BID - ESA per nephrology, weekly, first dose 12/1

## 2024-10-27 NOTE — Progress Notes (Signed)
 Contacted by nephrology regarding CLIP request for out-pt HD clinic placement. Plan to meet with pt to discuss this once possible. Will update as feasible.   Lavanda Robecca [facility] navigator [phone]

## 2024-10-27 NOTE — Procedures (Signed)
 Interventional Radiology Procedure Note  Procedure: RT internal jugular TUNNELED HD CATH    Complications: None  Estimated Blood Loss:  MIN  Findings: SVCRA    EMERSON FREDERIC SPECKING, MD

## 2024-10-27 NOTE — Plan of Care (Signed)

## 2024-10-27 NOTE — Progress Notes (Signed)
  Received patient in bed to unit.   Informed consent signed and in chart.    TX duration:2      Transported by  Hand-off given to patient's nurse.    Access used: Rt HD cath Access issues: None   Total UF removed: 0 Medication(s) given: None Post HD VS: 122/46      Hunter Hacking LPN [facility]

## 2024-10-28 DIAGNOSIS — D649 Anemia, unspecified: Secondary | ICD-10-CM | POA: Diagnosis not present

## 2024-10-28 LAB — CBC
HCT: 27.5 % — ABNORMAL LOW (ref 36.0–46.0)
Hemoglobin: 9.2 g/dL — ABNORMAL LOW (ref 12.0–15.0)
MCH: 30.8 pg (ref 26.0–34.0)
MCHC: 33.5 g/dL (ref 30.0–36.0)
MCV: 92 fL (ref 80.0–100.0)
Platelets: 108 K/uL — ABNORMAL LOW (ref 150–400)
RBC: 2.99 MIL/uL — ABNORMAL LOW (ref 3.87–5.11)
RDW: 15.1 % (ref 11.5–15.5)
WBC: 10.7 K/uL — ABNORMAL HIGH (ref 4.0–10.5)
nRBC: 0 % (ref 0.0–0.2)

## 2024-10-28 LAB — RENAL FUNCTION PANEL
Albumin: 3.4 g/dL — ABNORMAL LOW (ref 3.5–5.0)
Anion gap: 13 (ref 5–15)
BUN: 79 mg/dL — ABNORMAL HIGH (ref 8–23)
CO2: 28 mmol/L (ref 22–32)
Calcium: 9.2 mg/dL (ref 8.9–10.3)
Chloride: 96 mmol/L — ABNORMAL LOW (ref 98–111)
Creatinine, Ser: 3.42 mg/dL — ABNORMAL HIGH (ref 0.44–1.00)
GFR, Estimated: 13 mL/min — ABNORMAL LOW (ref >60)
Glucose, Bld: 139 mg/dL — ABNORMAL HIGH (ref 70–99)
Phosphorus: 5 mg/dL — ABNORMAL HIGH (ref 2.5–4.6)
Potassium: 4.2 mmol/L (ref 3.5–5.1)
Sodium: 137 mmol/L (ref 135–145)

## 2024-10-28 LAB — HAPTOGLOBIN: Haptoglobin: 284 mg/dL (ref 42–346)

## 2024-10-28 LAB — MAGNESIUM: Magnesium: 1.8 mg/dL (ref 1.7–2.4)

## 2024-10-28 LAB — GLUCOSE, CAPILLARY
Glucose-Capillary: 88 mg/dL (ref 70–99)
Glucose-Capillary: 123 mg/dL — ABNORMAL HIGH (ref 70–99)
Glucose-Capillary: 150 mg/dL — ABNORMAL HIGH (ref 70–99)

## 2024-10-28 LAB — HEPATITIS B SURFACE ANTIBODY, QUANTITATIVE: Hep B S AB Quant (Post): <3.5 m[IU]/mL — ABNORMAL LOW

## 2024-10-28 MED ORDER — ANTICOAGULANT SODIUM CITRATE 4% (200MG/5ML) IV SOLN
5.0000 mL | Status: DC | PRN
  Filled 2024-10-28: qty 5

## 2024-10-28 MED ORDER — HEPARIN SODIUM (PORCINE) 1000 UNIT/ML IJ SOLN
INTRAMUSCULAR | Status: AC
  Filled 2024-10-28: qty 4

## 2024-10-28 MED ORDER — PENTAFLUOROPROP-TETRAFLUOROETH EX AERO: 1.0000 | INHALATION_SPRAY | CUTANEOUS | Status: DC | PRN

## 2024-10-28 MED ORDER — HEPARIN SODIUM (PORCINE) 1000 UNIT/ML IJ SOLN
3200.0000 [IU] | Freq: Once | INTRAMUSCULAR | Status: AC
  Administered 2024-10-28: 3200 [IU]

## 2024-10-28 MED ORDER — LIDOCAINE-PRILOCAINE 2.5-2.5 % EX CREA: 1.0000 | TOPICAL_CREAM | CUTANEOUS | Status: DC | PRN

## 2024-10-28 MED ORDER — LIDOCAINE HCL (PF) 1 % IJ SOLN: 5.0000 mL | INTRAMUSCULAR | Status: DC | PRN

## 2024-10-28 MED ORDER — HEPARIN SODIUM (PORCINE) 1000 UNIT/ML DIALYSIS: 1000.0000 [IU] | INTRAMUSCULAR | Status: DC | PRN

## 2024-10-28 MED ORDER — ACETAMINOPHEN 325 MG PO TABS
ORAL_TABLET | ORAL | Status: AC
  Filled 2024-10-28: qty 2

## 2024-10-28 NOTE — Progress Notes (Signed)
 PT Cancellation Note  Patient Details Name: Kimberly Carlson MRN: [id number] DOB: 1946-08-24   Cancelled Treatment:    Reason Eval/Treat Not Completed: (P) Patient declined, no reason specified (Pt reports BLE pain and refuses mobility despite encouragement. Will continue to follow.)   [name] 10/28/2024, 2:28 PM

## 2024-10-28 NOTE — Progress Notes (Addendum)
     Daily Progress Note Intern Pager: [phone]  Patient name: Kimberly Carlson Medical record number: [id number] Date of birth: [birth year] Age: 78 y.o. Gender: female  Primary Care Provider: Howell Lunger, DO Consultants: nephrology, GI (s/o) Code Status: full  Pt Overview and Major Events to Date:  11/30 - admitted  12/1 - nephrology and GI consulted  Assessment and Plan:  78 yo with PMH of CKD stage 5 who was admitted with acute worsening of chronic anemia, suspected secondary to anemia of chronic disease and potential GI source; however, patient has declined further evaluation of GI source of bleed. Nephrology consulted and recommended restarting ESA, but patient declined, and dialysis initiated. Patient is medically stable for discharge from an anemia perspective, and we await outpatient establishment of HD for safe discharge. Assessment & Plan Acute on chronic anemia Hgb stable after restart ASA and DVT ppx. Haptoglobin resulted and wnl.  - Continue PO PPI BID - AM CBC - Continue ESA outpatient, per patient preference and nephrology ESRD (end stage renal disease) on dialysis [facility] - nephrology following, appreciate recs - HD again today - CLIP pending - AM RFP, Mg Leg weakness Appreciate PT insight. Recs for [facility] PT. - lidocaine  patches for bilateral ankle pain Chronic health problem Will start scheduled bowel regimen as no BM since Saturday, per patient report DM 2 : Glucose yesterday 84-178. Received 28 units lantus . Today begins the decreased dose of Home Lantus  to 20 units. Continue CBG ACHS HTN : Given persistent diastolic hypotension, and initiation of dialysis, will Continue to Hold Hydralazine  50 mg QID and Doxazosin , Torsemide  100 mg Daily HLD : continue Home Lipitor 40 mg Daily  CAD : home ASA 81 mg daily, Continue home Coreg  6.25 mg BID  FEN/GI: renal diet PPx: [facility] Dispo:Home pending CLIP.   Subjective:  No lightheadedness, SOB today. Only concern is  chronic leg pain.  Objective: Temp:  [97.6 F (36.4 C)-99.7 F (37.6 C)] 99.7 F (37.6 C) (12/03 0545) Pulse Rate:  [63-74] 69 (12/03 0545) Resp:  [10-20] 20 (12/02 1639) BP: (102-180)/(40-65) 123/48 (12/03 0545) SpO2:  [95 %-100 %] 96 % (12/03 0545) Physical Exam: General: well appearing woman getting HD Cardiovascular: early systolic murmur, rrr Respiratory: CTAB, no increased WOB Abdomen: soft, nontender Extremities: no edema or focal bony abnormalities  Laboratory: Most recent CBC Lab Results  Component Value Date   WBC 10.7 (H) 10/28/2024   HGB 9.2 (L) 10/28/2024   HCT 27.5 (L) 10/28/2024   MCV 92.0 10/28/2024   PLT 108 (L) 10/28/2024   Most recent BMP    Latest Ref Rng & Units 10/28/2024    4:27 AM  BMP  Glucose 70 - 99 mg/dL 860   BUN 8 - 23 mg/dL 79   Creatinine 9.55 - 1.00 mg/dL 6.57   Sodium 864 - 854 mmol/L 137   Potassium 3.5 - 5.1 mmol/L 4.2   Chloride 98 - 111 mmol/L 96   CO2 22 - 32 mmol/L 28   Calcium  8.9 - 10.3 mg/dL 9.2    Mg 1.8 Phos 5.0  Imaging/Diagnostic Tests: None  Alena Morrison, Elio, MD 10/28/2024, 7:37 AM  PGY-1, [facility] FPTS Intern pager: [phone], text pages welcome Secure chat group [facility] Teaching Service

## 2024-10-28 NOTE — Plan of Care (Signed)

## 2024-10-28 NOTE — Progress Notes (Signed)
 [facility] Progress Note  Name: Kimberly Carlson MRN: [id number] DOB: March 21, 1946  Chief Complaint:  Shortness of breath and weakness  Subjective:  Strict ins/outs are not available.  There is one unmeasured urine void charted.  She had HD today for 2 hours and then treatment ended after clotting.  She is worried that she can't really walk and her plan had been to go home by herself.  She states that someone had to help her to the bathroom.   Review of systems:   Denies shortness of breath or chest pain  She states that she had nausea after she ate something here on the floor She has had some lower leg tenderness - she doesn't know what's going on with this  ------------------- Background on consult:  Kimberly Carlson is a 78 y.o. female with a history of CKD stage V, diabetes mellitus, distant cocaine use, and hypertension who presented to the hospital with shortness of breath, weakness, and fatigue.  She was found to have acute on chronic anemia and is suspected to have a component of CKD contributing to her anemia.  They have also consulted GI due to concern for GI bleed.  Team is requesting input specifically on whether or not she would be appropriate for ESA.  Hemoglobin was 6.4 yesterday and she received 2 units of packed red blood cells.  For reference, the patient follows with Dr. Tobie outpatient and on 09/23/2024, her creatinine was 3.96 and BUN was 100.  At that appointment, she deferred a referral to [facility].  She was reported to be prescribed Lasix  40 mg twice a day but was just taking it daily per his note.  She was previously on ESA outpatient through our office however Dr. Anthony note indicates that this had been held as her hemoglobin had improved to greater than 11.  She had one unmeasured urine void over 11/30 charted.  Nephrology is consulted for assistance with management of advanced CKD and anemia.  She denies any blood per rectum or dark stools.  She has  recently used a cane to walk around but prior to feeling poorly she did not even need this.  She and I discussed the risks, benefits, and indications for dialysis and she does consent to dialysis.  She had been hoping to avoid dialysis.  She was worried about how she would get back and forth to dialysis and I let her know there are programs that offer assistance with this.  She asks if it is possible to do dialysis at home.  We discussed types of dialysis.      Intake/Output Summary (Last 24 hours) at 10/28/2024 1339 Last data filed at 10/28/2024 1008 Gross per 24 hour  Intake --  Output 200 ml  Net -200 ml    Vitals:  Vitals:   10/28/24 1000 10/28/24 1008 10/28/24 1027 10/28/24 1159  BP: (!) 120/43 (!) 120/43 128/63 (!) 157/70  Pulse: 70 68 70 79  Resp: 16 17 18 20   Temp:  98.6 F (37 C)  99 F (37.2 C)  TempSrc:  Oral  Oral  SpO2: 100% 100%  96%  Weight:  79.3 kg    Height:         Physical Exam:  General elderly female in bed in no acute distress HEENT normocephalic atraumatic extraocular movements intact sclera anicteric Neck supple trachea midline Lungs clear to auscultation bilaterally normal work of breathing at rest on room air Heart S1S2 no rub Abdomen soft nontender  nondistended Extremities no edema appreciated but left > right leg is tender Psych normal mood and affect Neuro alert and oriented x 3 provides hx and follows commands  Access RIJ tunneled catheter    Medications reviewed   Labs:     Latest Ref Rng & Units 10/28/2024    4:27 AM 10/27/2024    1:21 AM 10/26/2024    4:37 AM  BMP  Glucose 70 - 99 mg/dL 860  821  80   BUN 8 - 23 mg/dL 79  841  843   Creatinine 0.44 - 1.00 mg/dL 6.57  5.27  5.44   Sodium 135 - 145 mmol/L 137  139  140   Potassium 3.5 - 5.1 mmol/L 4.2  4.6  4.3   Chloride 98 - 111 mmol/L 96  106  105   CO2 22 - 32 mmol/L 28  21  19    Calcium  8.9 - 10.3 mg/dL 9.2  9.3  9.3      Assessment/Plan:    # CKD stage V with progression  to ESRD - started HD on 12/2 after tunneled catheter with IR.  Holding off on AVF as she is considering whether PD would be the best option for her - HD on 12/3 then next HD would be 12/5 (Friday) if remains inpatient   - Would stop spironolactone   - Strict ins/outs and daily weights are ordered - She has been CLIP'd to the TCU at the [facility] location on a Mon Tues Thurs and Friday schedule.  TCU would be good as she would consider dialysis at home, perhaps PD - Will need tight heparin  with HD outpatient due to clotting - dialysis educator consulted.    # Anemia - acute on chronic - concern for acute blood loss and also with contribution from CKD  - Recently received ESA outpatient.  Last dose of retacrit  10/20/24.  Would resume ESA and can start mircera 100 mcg every 2 weeks on 12/9 - PRBC's per primary team  - appreciate GI.  She declined an invasive GI evaluation for anemia with EGD and colonoscopy - reassess outpatient - she is on a PPI   # HTN  - acceptable control - would discharge on her current regimen  - can resume torsemide 100 mg in the AM's on non-dialysis days    # Metabolic acidosis - Started HD  - trend renal panel    Disposition - per primary team but mobility is a significant issue.  If she is ultimately redirected to a SNF would need to CLIP to another HD unit    Katheryn JAYSON Saba, MD 10/28/2024 1:39 PM

## 2024-10-28 NOTE — Progress Notes (Signed)
 New Dialysis Start ESRD   Patient identified as new dialysis start. Kidney Education packet assembled and given. Discussed the following items with patient:     Current medications and possible changes once started:  Discussed that patient's medications may change over time.  Ex; hypertension medications and diabetes medication.  Nephrologists will adjust as needed.   Fluid restrictions reviewed:  32 oz daily goal:  All liquids count; soups, ice, jello, fruits. Will also refer dietitian.   Phosphorus and potassium: Handout given showing high potassium and phosphorus foods.  Alternative food and drink options given. Will also refer dietitian.   Family support:  None at bedside, patient stated that her daughter is a big help   Outpatient Clinic Resources:  Discussed roles of Outpatient clinic staff and advised to make a list of needs, if any, to talk with outpatient staff if needed.   Care plan schedule: Informed patient of Care Plans in outpatient setting and to participate in the care plan.  An invitation would be given from outpatient clinic.    Dialysis Access Options:  Reviewed access options with patient. Discussed in detail about care at home with new AVG & AVF. Reviewed checking bruit and thrill. If dialysis catheter present, educated that patient could not take showers.  Catheter dressing changes were to be done by outpatient clinic staff only.   Home therapy options:  Educated patient about home therapy options:  PD vs home hemo.     Patient verbalized understanding. Discussed in detail the differences between Hemodialysis and Peritoneal Dialysis. Pending referral to TCU program for education and modality teachings. Will continue to round on patient during admission.    Alfonso Heckler Dialysis Nurse Coordinator [phone] Secure Chat Available

## 2024-10-28 NOTE — TOC Progression Note (Signed)
 Transition of Care [facility]) - Progression Note    Patient Details  Name: Kimberly Carlson MRN: [id number] Date of Birth: 09-Jun-1946  Transition of Care [facility]) CM/SW Contact  Rosaline JONELLE Joe, RN Phone Number: 10/28/2024, 12:03 PM  Clinical Narrative:    CM met with the patient and daughter at the bedside and patient states that her daughter plans to provide transportation to HD in the community by car since the patient does not want to take [employer] transportation.  [employer] application was give to the daughter that was completed yesterday.  Daughter is aware that patient will have availability to MSW at the dialysis facility as well.                     Expected Discharge Plan and Services                                               Social Drivers of Health (SDOH) Interventions SDOH Screenings   Food Insecurity: No Food Insecurity (10/25/2024)  Housing: Low Risk  (10/25/2024)  Transportation Needs: No Transportation Needs (10/25/2024)  Utilities: Not At Risk (10/25/2024)  Depression (PHQ2-9): Low Risk  (07/06/2024)  Financial Resource Strain: Low Risk  (06/23/2024)  Physical Activity: Insufficiently Active (06/23/2024)  Social Connections: Moderately Isolated (10/25/2024)  Stress: No Stress Concern Present (06/23/2024)  Tobacco Use: Medium Risk (10/25/2024)  Health Literacy: Adequate Health Literacy (12/12/2023)    Readmission Risk Interventions     No data to display

## 2024-10-28 NOTE — Assessment & Plan Note (Signed)
-   nephrology following, appreciate recs - HD again today - CLIP pending - AM RFP, Mg

## 2024-10-28 NOTE — Assessment & Plan Note (Signed)
 Appreciate PT insight. Recs for [facility] PT. - lidocaine  patches for bilateral ankle pain

## 2024-10-28 NOTE — Progress Notes (Signed)
 Transition of Care [facility]) - Inpatient Brief Assessment   Patient Details  Name: Kimberly Carlson MRN: [id number] Date of Birth: 07-27-1946  Transition of Care [facility]) CM/SW Contact:    Rosaline JONELLE Joe, RN Phone Number: 10/28/2024, 12:11 PM   Clinical Narrative: CM met with the patient to discuss IP Care management needs.  The patient lives at the home with her son and grandson.  Patient has [place], RW at the home.  Patient was offered CMS choice regarding home health and patient did not have a preference.  I called [facility] and Darleene, RNCM accepted for services after patient was faxed out in the hub.  Patient is new HD patient and patient requested transportation assistance yesterday and [employer] application was filled out and sent to [employer] with [employer] transportation email.  The patient decided today that her daughter will be providing transportation to dialysis instead.  Daughter was provided [employer] application as backup.  No other IP Care management needs.  Patient will return home with family by car when stable.   Transition of Care Asessment: Insurance and Status: (P) Insurance coverage has been reviewed Patient has primary care physician: (P) Yes Home environment has been reviewed: (P) from home with son, grandson Prior level of function:: (P) self Prior/Current Home Services: (P) No current home services Social Drivers of Health Review: (P) SDOH reviewed interventions complete Readmission risk has been reviewed: (P) Yes Transition of care needs: (P) transition of care needs identified, TOC will continue to follow

## 2024-10-28 NOTE — Progress Notes (Signed)
 Received patient in bed to unit.  Alert and oriented.  Informed consent signed and in chart.   TX duration:1 hour and 52 minutes  Patient tolerated well.  Transported back to the room  Alert, without acute distress.  Hand-off given to patient's nurse.   Access used: R upper internal jugular HD cath Access issues: none  Total UF removed: Medication(s) given: tylenol    10/28/24 1008  Vitals  Temp 98.6 F (37 C)  Temp Source Oral  BP (!) 120/43  Pulse Rate 68  Resp 17  Oxygen Therapy  SpO2 100 %  O2 Device Room Air  Patient Activity (if Appropriate) In bed  Pulse Oximetry Type Continuous  Oximetry Probe Site Changed No  During Treatment Monitoring  Blood Flow Rate (mL/min) 199 mL/min  Arterial Pressure (mmHg) -105.85 mmHg  Venous Pressure (mmHg) 59.79 mmHg  TMP (mmHg) 1.21 mmHg  Ultrafiltration Rate (mL/min) 480 mL/min  Dialysate Flow Rate (mL/min) 300 ml/min  Dialysate Potassium Concentration 3  Dialysate Calcium  Concentration 2.5  Duration of HD Treatment -hour(s) 1.87 hour(s)  Cumulative Fluid Removed (mL) per Treatment  199.9  HD Safety Checks Performed Yes  Intra-Hemodialysis Comments See progress note (pt machine clotting off waiting on Dr to make call on setting up again or ending we had left)  Post Treatment  Dialyzer Clearance Lightly streaked  Liters Processed 64.5  Fluid Removed (mL) 200 mL  Tolerated HD Treatment No (Comment)  Post-Hemodialysis Comments Patient clotted off, Dr. Jerrye ok with stopping.  Hemodialysis Catheter Right Subclavian Double lumen Permanent (Tunneled)  Placement Date/Time: 10/27/24 1023   Serial / Lot #: [id number]  Expiration Date: 04/25/29  Time Out: Correct patient;Correct site;Correct procedure  Maximum sterile barrier precautions: Hand hygiene;Cap;Mask;Sterile gown;Sterile gloves;Large sterile s...  Site Condition No complications  Blue Lumen Status Flushed;Heparin  locked;Antimicrobial dead end cap  Red Lumen  Status Flushed;Heparin  locked;Antimicrobial dead end cap  Purple Lumen Status N/A  Catheter fill solution Heparin  1000 units/ml  Catheter fill volume (Arterial) 1.6 cc  Catheter fill volume (Venous) 1.6  Dressing Type Transparent  Dressing Status Antimicrobial disc/dressing in place;Clean, Dry, Intact  Drainage Description None  Dressing Change Due 11/03/24     Camellia Brasil LPN Kidney Dialysis Unit

## 2024-10-28 NOTE — Progress Notes (Addendum)
 Met bedside with pt in HD, discussed out-pt clinic options. Pt is very interested in home HD/PD alteratives and is requesting training, therefore would like to be placed at TCU at [facility]. Navigator explained it would be 4 days a week MTTF instead of 3 while in TCU, pt agreeable to this. Pt did state she will need transport to and from clinic, social work informed and application for transport underway. Will submit referral now to [facility] admissions for review.   Lavanda Athenia Rys Dialysis Navigator [phone]  Addendum 1007am Referral submitted for TCU/[facility]. Awaiting financial and medical clearance at this time. Will continue to assist as needed.   Addendum 1:15pm Pt has been approved for a 4xweek (MTTF) schedule at TCU at [facility]. Pt has a tentative start date of tomorrow, 12/4. Will need to arrive at 9 am. At this 1st appointment, they will determine a better chair time for future days with pt and family. Met bedside to discuss this with pt who is agreeable, however the 9am tomorrow will not work, as the daughter has an appointment for herself at 8am. Per social work/s note, daughter will be driving pt to her appts. Contacted again [facility] who stated they can push back appointment time to 1pm tomorrow, called daughter who is agreeable to this. Care team including RN, nephrologist, renal PA, attending, SW/CM informed of this. Will update AVS to reflect upcoming appt info and continue to assist.   Addendum 1:45 AVS updated, clinic aware of pt to start at 1 tmrrw. Renal PA should send orders, No further support needed.   Addendum 3:58 Pt will likely not d/c today due to PT needs. Have informed clinic, they will hodl her spot.

## 2024-10-28 NOTE — Assessment & Plan Note (Signed)
 Will start scheduled bowel regimen as no BM since Saturday, per patient report DM 2 : Glucose yesterday 84-178. Received 28 units lantus . Today begins the decreased dose of Home Lantus  to 20 units. Continue CBG ACHS HTN : Given persistent diastolic hypotension, and initiation of dialysis, will Continue to Hold Hydralazine  50 mg QID and Doxazosin, Torsemide 100 mg Daily HLD : continue Home Lipitor 40 mg Daily  CAD : home ASA 81 mg daily, Continue home Coreg  6.25 mg BID

## 2024-10-28 NOTE — Assessment & Plan Note (Addendum)
 Hgb stable after restart ASA and DVT ppx. Haptoglobin resulted and wnl.  - Continue PO PPI BID - AM CBC - Continue ESA outpatient, per patient preference and nephrology

## 2024-10-29 DIAGNOSIS — M79605 Pain in left leg: Secondary | ICD-10-CM

## 2024-10-29 DIAGNOSIS — N179 Acute kidney failure, unspecified: Secondary | ICD-10-CM

## 2024-10-29 DIAGNOSIS — M79604 Pain in right leg: Secondary | ICD-10-CM

## 2024-10-29 DIAGNOSIS — D649 Anemia, unspecified: Secondary | ICD-10-CM | POA: Diagnosis not present

## 2024-10-29 DIAGNOSIS — E1165 Type 2 diabetes mellitus with hyperglycemia: Secondary | ICD-10-CM

## 2024-10-29 DIAGNOSIS — Z794 Long term (current) use of insulin: Secondary | ICD-10-CM

## 2024-10-29 DIAGNOSIS — N186 End stage renal disease: Secondary | ICD-10-CM

## 2024-10-29 DIAGNOSIS — Z992 Dependence on renal dialysis: Secondary | ICD-10-CM

## 2024-10-29 LAB — MAGNESIUM: Magnesium: 1.8 mg/dL (ref 1.7–2.4)

## 2024-10-29 LAB — GLUCOSE, CAPILLARY
Glucose-Capillary: 115 mg/dL — ABNORMAL HIGH (ref 70–99)
Glucose-Capillary: 126 mg/dL — ABNORMAL HIGH (ref 70–99)
Glucose-Capillary: 106 mg/dL — ABNORMAL HIGH (ref 70–99)
Glucose-Capillary: 172 mg/dL — ABNORMAL HIGH (ref 70–99)

## 2024-10-29 LAB — RENAL FUNCTION PANEL
Albumin: 3.3 g/dL — ABNORMAL LOW (ref 3.5–5.0)
Anion gap: 12 (ref 5–15)
BUN: 56 mg/dL — ABNORMAL HIGH (ref 8–23)
CO2: 26 mmol/L (ref 22–32)
Calcium: 9.1 mg/dL (ref 8.9–10.3)
Chloride: 97 mmol/L — ABNORMAL LOW (ref 98–111)
Creatinine, Ser: 3.79 mg/dL — ABNORMAL HIGH (ref 0.44–1.00)
GFR, Estimated: 12 mL/min — ABNORMAL LOW (ref >60)
Glucose, Bld: 104 mg/dL — ABNORMAL HIGH (ref 70–99)
Phosphorus: 5.8 mg/dL — ABNORMAL HIGH (ref 2.5–4.6)
Potassium: 4.1 mmol/L (ref 3.5–5.1)
Sodium: 135 mmol/L (ref 135–145)

## 2024-10-29 LAB — CBC
HCT: 29.6 % — ABNORMAL LOW (ref 36.0–46.0)
Hemoglobin: 9.6 g/dL — ABNORMAL LOW (ref 12.0–15.0)
MCH: 30.7 pg (ref 26.0–34.0)
MCHC: 32.4 g/dL (ref 30.0–36.0)
MCV: 94.6 fL (ref 80.0–100.0)
Platelets: 104 K/uL — ABNORMAL LOW (ref 150–400)
RBC: 3.13 MIL/uL — ABNORMAL LOW (ref 3.87–5.11)
RDW: 14.7 % (ref 11.5–15.5)
WBC: 9.9 K/uL (ref 4.0–10.5)
nRBC: 0 % (ref 0.0–0.2)

## 2024-10-29 MED ORDER — GABAPENTIN 100 MG PO CAPS
200.0000 mg | ORAL_CAPSULE | Freq: Every day | ORAL | Status: DC
  Administered 2024-10-29: 200 mg via ORAL
  Filled 2024-10-29: qty 2

## 2024-10-29 MED ORDER — CHLORHEXIDINE GLUCONATE CLOTH 2 % EX PADS: 6.0000 | MEDICATED_PAD | Freq: Every day | CUTANEOUS | Status: DC

## 2024-10-29 MED ORDER — OXIDIZED CELLULOSE EX PADS
1.0000 | MEDICATED_PAD | Freq: Once | CUTANEOUS | Status: DC
  Filled 2024-10-29 (×2): qty 1

## 2024-10-29 MED ORDER — CARVEDILOL 12.5 MG PO TABS
12.5000 mg | ORAL_TABLET | Freq: Two times a day (BID) | ORAL | Status: DC
  Filled 2024-10-29: qty 1

## 2024-10-29 MED ORDER — DICLOFENAC SODIUM 1 % EX GEL
2.0000 g | Freq: Two times a day (BID) | CUTANEOUS | Status: DC
  Administered 2024-10-29 – 2024-10-30 (×3): 2 g via TOPICAL
  Filled 2024-10-29: qty 100

## 2024-10-29 MED ORDER — DULOXETINE HCL 30 MG PO CPEP
30.0000 mg | ORAL_CAPSULE | Freq: Every day | ORAL | Status: DC
  Administered 2024-10-29 – 2024-10-30 (×2): 30 mg via ORAL
  Filled 2024-10-29 (×3): qty 1

## 2024-10-29 MED ORDER — OXYCODONE HCL 5 MG PO TABS
5.0000 mg | ORAL_TABLET | Freq: Once | ORAL | Status: DC | PRN
  Filled 2024-10-29: qty 1

## 2024-10-29 MED ORDER — CAPSAICIN 0.025 % EX CREA
TOPICAL_CREAM | Freq: Two times a day (BID) | CUTANEOUS | Status: DC
  Filled 2024-10-29: qty 60

## 2024-10-29 MED ORDER — ACETAMINOPHEN 325 MG PO TABS
650.0000 mg | ORAL_TABLET | Freq: Four times a day (QID) | ORAL | Status: DC
  Administered 2024-10-29 – 2024-10-30 (×4): 650 mg via ORAL
  Filled 2024-10-29 (×4): qty 2

## 2024-10-29 NOTE — Progress Notes (Signed)
    Durable Medical Equipment  (From admission, onward)           Start     Ordered   10/29/24 1202  For home use only DME Other see comment  Once       Comments: Please provide transport chair.  Patient with acute on chronic anemia, ESRD.  Question:  Length of Need  Answer:  Lifetime   10/29/24 1202

## 2024-10-29 NOTE — Assessment & Plan Note (Signed)
 Will start scheduled bowel regimen as no BM since Saturday, per patient report DM 2 : AM fasting glucose 115. Glucose 88-150. Continue lantus  at reduced 20 units.  Continue CBG ACHS HTN : Given persistent diastolic hypotension, and initiation of dialysis, will Continue to Hold Hydralazine  50 mg QID and Doxazosin, Torsemide 100 mg Daily HLD : continue Home Lipitor 40 mg Daily  CAD : home ASA 81 mg daily, Continue home Coreg  6.25 mg BID

## 2024-10-29 NOTE — Progress Notes (Addendum)
 Physical Therapy Treatment Patient Details Name: Kimberly Carlson MRN: [id number] DOB: 12-08-1945 Today's Date: 10/29/2024   History of Present Illness 78 yo female present with weakness and SOB hbg 6.0 pt with workup for GIB.   PMH CKD V chronic anemia, DM2, HTN, HLD, CAD.    PT Comments  Pt supine in bed on entry with complaints of 8/10 pain in bilateral ankles just laying there. Discussed need to be able to get to and from dialysis after discharge and that HD can cause increased fatigue. Given the amount of pain pt is in PT suggested wheelchair for getting to and from HD. Daughter, who will be taking pt to dialysis, in agreement but is worried about getting wheelchair in and out of car. Agreed that transport chair may be more appropriate. Attempted pivot transfer to wheelchair but pt unable to tolerate pain in ankles with weightbearing despite maximal assist from PT. Pt returned to supine on her own and was able to scoot up in bed on her own. As PT placed blanket over her feet, she screamed out. MD made aware of increased pain to light touch. MD to adjust pain management and PT will follow back in morning for transfer training.     If plan is discharge home, recommend the following: A little help with walking and/or transfers;A little help with bathing/dressing/bathroom;Help with stairs or ramp for entrance   Can travel by private vehicle      Yes  Equipment Recommendations  Other (comment);BSC/3in1 (transport chair)       Precautions / Restrictions Precautions Precautions: Fall Recall of Precautions/Restrictions: Intact Restrictions Weight Bearing Restrictions Per Provider Order: No     Mobility  Bed Mobility Overal bed mobility: Modified Independent             General bed mobility comments: increased time and use of bed features    Transfers Overall transfer level: Needs assistance Equipment used: 1 person hand held assist Transfers: Bed to chair/wheelchair/BSC        Squat pivot transfers: Max assist     General transfer comment: attempted to perform squat pivot transfer to wheelchair on her R with maxA but unable to bearweight  through LE due to 10/10 pain, attempted to transfer to [facility] on her L and again was not able to bear weight through LE    Ambulation/Gait Ambulation/Gait assistance: Contact guard assist   Assistive device: Rolling walker (2 wheels) Gait Pattern/deviations: Step-through pattern, Decreased stride length, Trunk flexed, Decreased weight shift to left       General Gait Details: Pt demonstrates slow gait with low foot clearance R>L with R foot sliding forward intermittently requiring cues for increased clearance.         Balance Overall balance assessment: Needs assistance   Sitting balance-Leahy Scale: Good     Standing balance support: Bilateral upper extremity supported, During functional activity, Reliant on assistive device for balance Standing balance-Leahy Scale: Fair Standing balance comment: reliant on RW support dynamically for LLE offloading                            Communication Communication Communication: No apparent difficulties  Cognition Arousal: Alert Behavior During Therapy: WFL for tasks assessed/performed   PT - Cognitive impairments: No apparent impairments                         Following commands: Intact  Cueing Cueing Techniques: Verbal cues, Tactile cues     General Comments General comments (skin integrity, edema, etc.): daughter present supportive and agrees that wheelchair for mobility into and out of HD would be more beneficial but is worried about weight and getting in and out of car. Pt and daughter agreeable to transport chair.      Pertinent Vitals/Pain Pain Assessment Pain Assessment: 0-10 Pain Score: 8  Pain Location: L ankle Pain Descriptors / Indicators: Aching, Guarding Pain Intervention(s): Limited activity within patient's tolerance,  Monitored during session, Repositioned     PT Goals (current goals can now be found in the care plan section) Acute Rehab PT Goals Patient Stated Goal: return to independent PT Goal Formulation: With patient/family Time For Goal Achievement: 11/09/24 Potential to Achieve Goals: Good Progress towards PT goals: Not progressing toward goals - comment (limited by pain 1059)    Frequency    Min 2X/week       AM-PAC PT 6 Clicks Mobility   Outcome Measure  Help needed turning from your back to your side while in a flat bed without using bedrails?: None Help needed moving from lying on your back to sitting on the side of a flat bed without using bedrails?: None Help needed moving to and from a bed to a chair (including a wheelchair)?: Total Help needed standing up from a chair using your arms (e.g., wheelchair or bedside chair)?: Total Help needed to walk in hospital room?: Total Help needed climbing 3-5 steps with a railing? : Total 6 Click Score: 12    End of Session Equipment Utilized During Treatment: Gait belt Activity Tolerance: Patient tolerated treatment well Patient left: with call bell/phone within reach;in chair;with nursing/sitter in room Nurse Communication: Mobility status PT Visit Diagnosis: Other abnormalities of gait and mobility (R26.89);Muscle weakness (generalized) (M62.81)     Time: 8874-8860 PT Time Calculation (min) (ACUTE ONLY): 14 min  Charges:    $Therapeutic Activity: 8-22 mins PT General Charges $$ ACUTE PT VISIT: 1 Visit                     Nhu Glasby B. Fleeta Lapidus PT, DPT Acute Rehabilitation Services Please use secure chat or  Call Office [phone]    Almarie KATHEE Fleeta Fleet 10/29/2024, 2:52 PM

## 2024-10-29 NOTE — Assessment & Plan Note (Addendum)
 Hgb improved this morning, day 2 restart ASA and on DVT ppx. TDC oozing this morning.  - PPI PO BID for 30 day then once daily OP recommended - ESA outpatient - surgicel for oozing TDC, appreciate IV team care - AM CBC - will make IR aware of TDC oozing

## 2024-10-29 NOTE — Assessment & Plan Note (Addendum)
 She has been CLIP'd and has an OP HD bed waiting for her. - nephro following appreciate recs - TTS HD - AM RFP, Mg

## 2024-10-29 NOTE — Progress Notes (Addendum)
 Daily Progress Note Intern Pager: [phone]  Patient name: Kimberly Carlson Medical record number: [id number] Date of birth: 1945-12-24 Age: 78 y.o. Gender: female  Primary Care Provider: Howell Lunger, DO Consultants: nephrology, GI (s/o)  Code Status: full  Pt Overview and Major Events to Date:  11/30 - admitted  12/1 - nephrology and GI consulted  Assessment and Plan:  78 yo with PMH of CKD stage 5 who was admitted with acute worsening of chronic anemia, suspected secondary to anemia of chronic disease and potential GI source. This morning she has oozing around her [facility] catheter but remains asymptomatic from an anemia perspective and hgb improved. Additionally, she has chronic leg pain that is exacerbated and prevents her from ambulating. Working on pain management, but discussed importance of outpatient optimization of pain regimen.  Assessment & Plan Acute on chronic anemia Hgb improved this morning, day 2 restart ASA and on DVT ppx. TDC oozing this morning.  - PPI PO BID for 30 day then once daily OP recommended - ESA outpatient - surgicel for oozing TDC, appreciate IV team care - AM CBC - will make IR aware of TDC oozing ESRD (end stage renal disease) on dialysis [facility]) She has been CLIP'd and has an OP HD bed waiting for her. - nephro following appreciate recs - TTS HD - AM RFP, Mg Leg pain Chronic leg pain, described as burning this morning, noted intermittently on chart review of years of OP FM notes. She has no focal tenderness but is hyperesthetic to light touch everywhere. No injury or ROM deficits. Suspect DPN. - Recommend HH PT for strengthening - cont lidocaine  patches  - start Cymbalta 30 mg qd - one-time oxycodone  5 mg to work with PT - ultimately will need OP follow up for optimization of pain regimen - increase gabapentin 200 mg qhs - schedule tylenol  q6h - start capsaicin and voltaren  gel - PT/OT Chronic health problem Will start scheduled bowel  regimen as no BM since Saturday, per patient report DM 2 : AM fasting glucose 115. Glucose 88-150. Continue lantus  at reduced 20 units.  Continue CBG ACHS HTN : Given persistent diastolic hypotension, and initiation of dialysis, will Continue to Hold Hydralazine  50 mg QID and Doxazosin, Torsemide 100 mg Daily HLD : continue Home Lipitor 40 mg Daily  CAD : home ASA 81 mg daily, Continue home Coreg  6.25 mg BID  FEN/GI: renal PPx: [facility]  Dispo:Home with home health today.   Subjective:  She reports persistent bilateral leg pain. No lightheadedness, chest pain, SOB.  Objective: Temp:  [98.2 F (36.8 C)-99.4 F (37.4 C)] 99.4 F (37.4 C) (12/04 0724) Pulse Rate:  [65-79] 75 (12/04 0724) Resp:  [13-20] 18 (12/04 0724) BP: (120-157)/(41-70) 127/45 (12/04 0724) SpO2:  [96 %-100 %] 99 % (12/04 0724) Weight:  [79.1 kg-79.5 kg] 79.1 kg (12/04 0500) Physical Exam: General: well appearing sitting up in bed Cardiovascular: RRR, 2+ radial pulses Respiratory: no increased WOB Abdomen: + bowel sounds, soft, nontender Extremities: diffuse tenderness to light touch over all portions of the legs distal to the knee, intact active ROM of the ankles and toes, no areas of increased tenderness, warmth, erythema, or effusion, calves symmetric  Laboratory: Most recent CBC Lab Results  Component Value Date   WBC 9.9 10/29/2024   HGB 9.6 (L) 10/29/2024   HCT 29.6 (L) 10/29/2024   MCV 94.6 10/29/2024   PLT 104 (L) 10/29/2024   Most recent BMP    Latest Ref Rng &  Units 10/29/2024    4:52 AM  BMP  Glucose 70 - 99 mg/dL 895   BUN 8 - 23 mg/dL 56   Creatinine 9.55 - 1.00 mg/dL 6.20   Sodium 864 - 854 mmol/L 135   Potassium 3.5 - 5.1 mmol/L 4.1   Chloride 98 - 111 mmol/L 97   CO2 22 - 32 mmol/L 26   Calcium  8.9 - 10.3 mg/dL 9.1     Imaging/Diagnostic Tests: None  Alena Morrison, Elio, MD 10/29/2024, 7:37 AM  PGY-1, [facility] FPTS Intern pager: [phone], text pages  welcome Secure chat group [facility] Teaching Service

## 2024-10-29 NOTE — Plan of Care (Signed)

## 2024-10-29 NOTE — Progress Notes (Signed)
 [facility] Progress Note  Name: Kimberly Carlson MRN: [id number] DOB: [birth year]  Chief Complaint:  Shortness of breath and weakness  Subjective:  She had 100 mL UOP over 12/4 thus far charted.  No urine output is charted for 12/3.  Last HD on 12/3 with 0.2 kg UF.  She has been complaining of leg pain 2-3 days - very sore to light touch.  Team is now working on this.  She states that she last walked two days ago.  Her daughter states that she is not going to a SNF - that she is going to help the patient.  She states that she feels dizzy on standing.   Review of systems:    Denies shortness of breath or chest pain  She states that she had nausea  Continued lower leg tenderness   ------------------- Background on consult:  Kimberly Carlson is a 78 y.o. female with a history of CKD stage V, diabetes mellitus, distant cocaine use, and hypertension who presented to the hospital with shortness of breath, weakness, and fatigue.  She was found to have acute on chronic anemia and is suspected to have a component of CKD contributing to her anemia.  They have also consulted GI due to concern for GI bleed.  Team is requesting input specifically on whether or not she would be appropriate for ESA.  Hemoglobin was 6.4 yesterday and she received 2 units of packed red blood cells.  For reference, the patient follows with Dr. Tobie outpatient and on 09/23/2024, her creatinine was 3.96 and BUN was 100.  At that appointment, she deferred a referral to [facility].  She was reported to be prescribed Lasix  40 mg twice a day but was just taking it daily per his note.  She was previously on ESA outpatient through our office however Dr. Anthony note indicates that this had been held as her hemoglobin had improved to greater than 11.  She had one unmeasured urine void over 11/30 charted.  Nephrology is consulted for assistance with management of advanced CKD and anemia.  She denies any blood per rectum or dark  stools.  She has recently used a cane to walk around but prior to feeling poorly she did not even need this.  She and I discussed the risks, benefits, and indications for dialysis and she does consent to dialysis.  She had been hoping to avoid dialysis.  She was worried about how she would get back and forth to dialysis and I let her know there are programs that offer assistance with this.  She asks if it is possible to do dialysis at home.  We discussed types of dialysis.      Intake/Output Summary (Last 24 hours) at 10/29/2024 1340 Last data filed at 10/29/2024 1300 Gross per 24 hour  Intake 480 ml  Output 100 ml  Net 380 ml    Vitals:  Vitals:   10/29/24 0500 10/29/24 0724 10/29/24 0800 10/29/24 1154  BP:  (!) 127/45 (!) 133/55 (!) 108/57  Pulse:  75  73  Resp:  18  18  Temp:  99.4 F (37.4 C) 98.5 F (36.9 C) 98.9 F (37.2 C)  TempSrc:   Oral   SpO2:  99%  98%  Weight: 79.1 kg     Height:         Physical Exam:  General elderly female in bed in no acute distress HEENT normocephalic atraumatic extraocular movements intact sclera anicteric Neck supple trachea midline Lungs clear  to auscultation bilaterally normal work of breathing at rest on room air Heart S1S2 no rub Abdomen soft nontender nondistended Extremities no edema appreciated but left > right leg is tender Psych normal mood and affect Neuro alert and oriented x 3 provides hx and follows commands  Access RIJ tunneled catheter    Medications reviewed   Labs:     Latest Ref Rng & Units 10/29/2024    4:52 AM 10/28/2024    4:27 AM 10/27/2024    1:21 AM  BMP  Glucose 70 - 99 mg/dL 895  860  821   BUN 8 - 23 mg/dL 56  79  841   Creatinine 0.44 - 1.00 mg/dL 6.20  6.57  5.27   Sodium 135 - 145 mmol/L 135  137  139   Potassium 3.5 - 5.1 mmol/L 4.1  4.2  4.6   Chloride 98 - 111 mmol/L 97  96  106   CO2 22 - 32 mmol/L 26  28  21    Calcium  8.9 - 10.3 mg/dL 9.1  9.2  9.3      Assessment/Plan:    # CKD stage V  with progression to ESRD - started HD on 12/2 after tunneled catheter with IR.  Holding off on AVF as she is considering whether PD would be the best option for her - No HD today (12/4).  Next HD on 12/5, Friday with tight heparin  - Strict ins/outs and daily weights are ordered - She has been CLIP'd to the TCU at the [facility] location on a Mon Tues Thurs and Friday schedule.  She is considering dialysis at home, perhaps PD.  - If this leg pain causes her to go to SNF, will need to re-CLIP so will have a low threshold to re-CLIP.  At this time, they state her plan is to go home.  Discussed mobility would need to improve - Will need tight heparin  with HD outpatient due to clotting - dialysis educator consulted.    # Anemia - acute on chronic - concern for acute blood loss and also with contribution from CKD  - Recently received ESA outpatient.  Last dose of retacrit  10/20/24.  Outpatient she cane resume ESA.  Would start mircera 100 mcg every 2 weeks on 11/03/24 (nephrology will take care of this) - PRBC's per primary team  - appreciate GI.  She declined an invasive GI evaluation for anemia with EGD and colonoscopy - reassess outpatient - she is on a PPI   # HTN  - acceptable control and actually a bit low - Reduce carvedilol  to 12.5 mg BID - would discharge on her current regimen (do not resume hydralazine , spironolactone ,or doxazosin)  - on discharge can resume torsemide 100 mg in the AM's on non-dialysis days  - Would not resume spironolactone  on discharge   # Metabolic acidosis - Started HD  - trend renal panel    Disposition - per primary team but mobility is a significant issue.  If she is ultimately redirected to a SNF would need to CLIP to another HD unit    Katheryn JAYSON Saba, MD 10/29/2024 2:03 PM

## 2024-10-29 NOTE — Progress Notes (Addendum)
 PT Cancellation Note  Patient Details Name: Kimberly Carlson MRN: [id number] DOB: 1946-07-23   Cancelled Treatment:    Reason Eval/Treat Not Completed: (P) Medical issues which prohibited therapy Pt with increased L ankle pain. MD working on appropriate pain medication for working with therapy. PT will follow back appropriate amount of time after pain medication has been administered.  Kimberly Carlson B. Fleeta Lapidus PT, DPT Acute Rehabilitation Services Please use secure chat or  Call Office [phone]    Kimberly Carlson 10/29/2024, 9:31 AM

## 2024-10-29 NOTE — Assessment & Plan Note (Addendum)
 Chronic leg pain, described as burning this morning, noted intermittently on chart review of years of OP FM notes. She has no focal tenderness but is hyperesthetic to light touch everywhere. No injury or ROM deficits. Suspect DPN. - Recommend HH PT for strengthening - cont lidocaine  patches  - start Cymbalta 30 mg qd - one-time oxycodone  5 mg to work with PT - ultimately will need OP follow up for optimization of pain regimen - increase gabapentin 200 mg qhs - schedule tylenol  q6h - start capsaicin and voltaren  gel - PT/OT

## 2024-10-29 NOTE — TOC Progression Note (Signed)
 Transition of Care [facility]) - Progression Note    Patient Details  Name: Kimberly Carlson MRN: [id number] Date of Birth: 1946-05-26  Transition of Care [facility]) CM/SW Contact  Rosaline JONELLE Joe, RN Phone Number: 10/29/2024, 2:04 PM  Clinical Narrative:    CM met with the patient and daughter at the bedside.  Daughter states that patient has one step to enter home and will not need ramp for home.  Daughter states that she needs a transport chair and 3:1 for home and did not have a preference for [employer].  [employer] was called to deliver Transport chair and 3:1 to the hospital room.    Patient has painful lower legs and is unable to ambulate with therapy due to pain.  MD team aware and ordering meds for treatment.  Patient is set up with [facility] for home health services.  Patient plans to return home with home health when able to mobilize.                     Expected Discharge Plan and Services                                               Social Drivers of Health (SDOH) Interventions SDOH Screenings   Food Insecurity: No Food Insecurity (10/25/2024)  Housing: Low Risk  (10/25/2024)  Transportation Needs: No Transportation Needs (10/25/2024)  Utilities: Not At Risk (10/25/2024)  Depression (PHQ2-9): Low Risk  (07/06/2024)  Financial Resource Strain: Low Risk  (06/23/2024)  Physical Activity: Insufficiently Active (06/23/2024)  Social Connections: Moderately Isolated (10/25/2024)  Stress: No Stress Concern Present (06/23/2024)  Tobacco Use: Medium Risk (10/25/2024)  Health Literacy: Adequate Health Literacy (12/12/2023)    Readmission Risk Interventions    10/28/2024   12:08 PM  Readmission Risk Prevention Plan  Transportation Screening Complete  PCP or Specialist Appt within 5-7 Days Complete  Home Care Screening Complete

## 2024-10-29 NOTE — Care Management Important Message (Signed)
 Important Message  Patient Details  Name: Kimberly Carlson MRN: [id number] Date of Birth: 01-29-1946   Important Message Given:  Yes - Medicare IM     Claretta Deed 10/29/2024, 3:35 PM

## 2024-10-29 NOTE — Progress Notes (Signed)
 IV team notified of continued oozing at HD site. MD treadwell Smucker informed. Awaiting decision re: surgicel.

## 2024-10-30 ENCOUNTER — Encounter (HOSPITAL_COMMUNITY): Payer: Self-pay

## 2024-10-30 ENCOUNTER — Encounter (HOSPITAL_COMMUNITY): Payer: Self-pay | Admitting: Nephrology

## 2024-10-30 ENCOUNTER — Other Ambulatory Visit (HOSPITAL_COMMUNITY): Payer: Self-pay

## 2024-10-30 DIAGNOSIS — D649 Anemia, unspecified: Secondary | ICD-10-CM | POA: Diagnosis not present

## 2024-10-30 LAB — CBC
HCT: 28.8 % — ABNORMAL LOW (ref 36.0–46.0)
Hemoglobin: 9.6 g/dL — ABNORMAL LOW (ref 12.0–15.0)
MCH: 30.6 pg (ref 26.0–34.0)
MCHC: 33.3 g/dL (ref 30.0–36.0)
MCV: 91.7 fL (ref 80.0–100.0)
Platelets: 85 K/uL — ABNORMAL LOW (ref 150–400)
RBC: 3.14 MIL/uL — ABNORMAL LOW (ref 3.87–5.11)
RDW: 14.5 % (ref 11.5–15.5)
WBC: 10.4 K/uL (ref 4.0–10.5)
nRBC: 0 % (ref 0.0–0.2)

## 2024-10-30 LAB — RENAL FUNCTION PANEL
Albumin: 3.1 g/dL — ABNORMAL LOW (ref 3.5–5.0)
Anion gap: 18 — ABNORMAL HIGH (ref 5–15)
BUN: 74 mg/dL — ABNORMAL HIGH (ref 8–23)
CO2: 25 mmol/L (ref 22–32)
Calcium: 9.7 mg/dL (ref 8.9–10.3)
Chloride: 95 mmol/L — ABNORMAL LOW (ref 98–111)
Creatinine, Ser: 4.66 mg/dL — ABNORMAL HIGH (ref 0.44–1.00)
GFR, Estimated: 9 mL/min — ABNORMAL LOW (ref >60)
Glucose, Bld: 88 mg/dL (ref 70–99)
Phosphorus: 6.5 mg/dL — ABNORMAL HIGH (ref 2.5–4.6)
Potassium: 4.4 mmol/L (ref 3.5–5.1)
Sodium: 138 mmol/L (ref 135–145)

## 2024-10-30 LAB — GLUCOSE, CAPILLARY
Glucose-Capillary: 140 mg/dL — ABNORMAL HIGH (ref 70–99)
Glucose-Capillary: 84 mg/dL (ref 70–99)
Glucose-Capillary: 78 mg/dL (ref 70–99)

## 2024-10-30 LAB — MAGNESIUM: Magnesium: 2 mg/dL (ref 1.7–2.4)

## 2024-10-30 MED ORDER — TORSEMIDE 100 MG PO TABS
100.0000 mg | ORAL_TABLET | ORAL | 0 refills | Status: AC
  Filled 2024-10-30: qty 15, 15d supply, fill #0

## 2024-10-30 MED ORDER — HEPARIN SODIUM (PORCINE) 1000 UNIT/ML IJ SOLN
INTRAMUSCULAR | Status: AC
  Filled 2024-10-30: qty 4

## 2024-10-30 MED ORDER — ANTICOAGULANT SODIUM CITRATE 4% (200MG/5ML) IV SOLN: 5.0000 mL | Status: DC | PRN

## 2024-10-30 MED ORDER — CAPSAICIN 0.025 % EX CREA
TOPICAL_CREAM | Freq: Two times a day (BID) | CUTANEOUS | 0 refills | Status: AC
  Filled 2024-10-30: qty 60, 30d supply, fill #0

## 2024-10-30 MED ORDER — DULOXETINE HCL 30 MG PO CPEP
30.0000 mg | ORAL_CAPSULE | Freq: Every day | ORAL | 0 refills | Status: DC
  Filled 2024-10-30: qty 30, 30d supply, fill #0

## 2024-10-30 MED ORDER — DICLOFENAC SODIUM 1 % EX GEL
2.0000 g | Freq: Two times a day (BID) | CUTANEOUS | 0 refills | Status: AC
  Filled 2024-10-30: qty 100, 25d supply, fill #0

## 2024-10-30 MED ORDER — PANTOPRAZOLE SODIUM 40 MG PO TBEC
DELAYED_RELEASE_TABLET | ORAL | 0 refills | Status: DC
  Filled 2024-10-30: qty 84, 57d supply, fill #0

## 2024-10-30 MED ORDER — PENTAFLUOROPROP-TETRAFLUOROETH EX AERO: 1.0000 | INHALATION_SPRAY | CUTANEOUS | Status: DC | PRN

## 2024-10-30 MED ORDER — HEPARIN SODIUM (PORCINE) 1000 UNIT/ML DIALYSIS: 20.0000 [IU]/kg | INTRAMUSCULAR | Status: DC | PRN

## 2024-10-30 MED ORDER — HEPARIN SODIUM (PORCINE) 1000 UNIT/ML DIALYSIS: 1000.0000 [IU] | INTRAMUSCULAR | Status: DC | PRN

## 2024-10-30 MED ORDER — SENNA 8.6 MG PO TABS
1.0000 | ORAL_TABLET | Freq: Every day | ORAL | 0 refills | Status: AC
  Filled 2024-10-30: qty 120, 120d supply, fill #0

## 2024-10-30 MED ORDER — LIDOCAINE HCL (PF) 1 % IJ SOLN: 5.0000 mL | INTRAMUSCULAR | Status: DC | PRN

## 2024-10-30 MED ORDER — GABAPENTIN 100 MG PO CAPS
200.0000 mg | ORAL_CAPSULE | Freq: Every day | ORAL | 0 refills | Status: AC
  Filled 2024-10-30: qty 60, 30d supply, fill #0

## 2024-10-30 MED ORDER — LIDOCAINE-PRILOCAINE 2.5-2.5 % EX CREA: 1.0000 | TOPICAL_CREAM | CUTANEOUS | Status: DC | PRN

## 2024-10-30 MED ORDER — CARVEDILOL 6.25 MG PO TABS
6.2500 mg | ORAL_TABLET | Freq: Two times a day (BID) | ORAL | 0 refills | Status: DC
  Filled 2024-10-30: qty 60, 30d supply, fill #0

## 2024-10-30 MED ORDER — ALTEPLASE 2 MG IJ SOLR: 2.0000 mg | Freq: Once | INTRAMUSCULAR | Status: DC | PRN

## 2024-10-30 MED ORDER — POLYETHYLENE GLYCOL 3350 17 GM/SCOOP PO POWD
17.0000 g | Freq: Every day | ORAL | 0 refills | Status: AC
  Filled 2024-10-30: qty 238, 14d supply, fill #0

## 2024-10-30 MED ORDER — CARVEDILOL 6.25 MG PO TABS: 6.2500 mg | ORAL_TABLET | Freq: Two times a day (BID) | ORAL | Status: DC

## 2024-10-30 MED ORDER — INSULIN GLARGINE 100 UNIT/ML ~~LOC~~ SOLN
SUBCUTANEOUS | 3 refills | Status: AC
  Filled 2024-10-30: qty 30, 84d supply, fill #0

## 2024-10-30 NOTE — Assessment & Plan Note (Addendum)
-   nephro following appreciate recs - nephro recommending HD today - plans for MTTF HD OP - AM RFP, Mg

## 2024-10-30 NOTE — Progress Notes (Signed)
 Daily Progress Note Intern Pager: [phone]  Patient name: Kimberly Carlson Medical record number: [id number] Date of birth: 1946/05/14 Age: 78 y.o. Gender: female  Primary Care Provider: Howell Lunger, DO Consultants: nephrology, GI (s/o) Code Status: full  Pt Overview and Major Events to Date:  11/30 - admitted  12/1 - nephrology and GI consulted  Assessment and Plan:  78 yo with PMH of CKD stage 5 who was admitted with acute worsening of chronic anemia, that remains stable five days after 2 pRBC transfusions. From the perspective of anemia, she is medically stable for discharge. She has had worsening of intermittent, chronic leg pain since admission, that is inhibiting her ability to ambulate. Multilmodal pain management ongoing with some improvement, and patient able to ambulate with PT today. They recommend home with [facility] which is set up for her. Nephrology does want her to have HD today. We will plan for discharge home with [facility] after HD.  Assessment & Plan Acute on chronic anemia Hgb remains stable. 9.6 today.  - PPI BID for 30 days then once daily for GI protection - recommended ESA with nephrology outpatient - no CBC tomorrow ESRD (end stage renal disease) on dialysis (HCC) - nephro following appreciate recs - nephro recommending HD today - plans for MTTF HD OP - AM RFP, Mg Leg pain Patient unable to work with PT yesterday. Did receive capsaicin , scheduled tylenol , voltaren  gel, cymbalta , 200 mg Gabapentin , lidocaine  patches yesterday. Patient did not want the oxycodone  offered.  - continue gabapentin , cymbalta , capsaicin , voltaren  gel, lidocaine  patches, and scheduled tylenol  - PT/OT  - HH PT - OP optimization of pain Chronic health problem Will start scheduled bowel regimen as no BM since Saturday, per patient report DM 2 : AM fasting glucose 115. Glucose 88-150. Continue lantus  at reduced 20 units.  Continue CBG ACHS HTN : Given persistent diastolic hypotension,  and initiation of dialysis, will Continue to Hold Hydralazine  50 mg QID and Doxazosin , Torsemide  100 mg Daily HLD : continue Home Lipitor 40 mg Daily  CAD : home ASA 81 mg daily, decrease Coreg  to 6.25 mg BID for diastolic hypotension  FEN/GI: renal PPx: none, given oozing TDC and anemia Dispo:Home with home health today.  Subjective:  Reports leg pain improved, but still burning through both legs all over.   Objective: Temp:  [98.1 F (36.7 C)-98.9 F (37.2 C)] 98.2 F (36.8 C) (12/05 0422) Pulse Rate:  [67-73] 70 (12/05 0422) Resp:  [18-20] 20 (12/05 0422) BP: (108-147)/(46-57) 142/51 (12/05 0422) SpO2:  [96 %-98 %] 98 % (12/05 0422) Weight:  [78.8 kg] 78.8 kg (12/05 0500) Physical Exam: General: well appearing female lying in bed Cardiovascular: RRR, systolic murmur Respiratory: CTAB, no accessory muscle use Abdomen: soft, nontender Extremities: active symmetric ROM of the ankles and toes, no focal knee, ankle or toe warmth, effusion, or erythema  Laboratory: Most recent CBC Lab Results  Component Value Date   WBC 10.4 10/30/2024   HGB 9.6 (L) 10/30/2024   HCT 28.8 (L) 10/30/2024   MCV 91.7 10/30/2024   PLT 85 (L) 10/30/2024   Most recent BMP    Latest Ref Rng & Units 10/30/2024    5:30 AM  BMP  Glucose 70 - 99 mg/dL 88   BUN 8 - 23 mg/dL 74   Creatinine 9.55 - 1.00 mg/dL 5.33   Sodium 864 - 854 mmol/L 138   Potassium 3.5 - 5.1 mmol/L 4.4   Chloride 98 - 111 mmol/L 95  CO2 22 - 32 mmol/L 25   Calcium  8.9 - 10.3 mg/dL 9.7    Mg 2.0  Imaging/Diagnostic Tests: None  Alena Morrison, [place], MD 10/30/2024, 7:41 AM  PGY-1, [facility] FPTS Intern pager: [phone], text pages welcome Secure chat group [facility] Teaching Service

## 2024-10-30 NOTE — Assessment & Plan Note (Signed)
 Patient unable to work with PT yesterday. Did receive capsaicin , scheduled tylenol , voltaren  gel, cymbalta , 200 mg Gabapentin , lidocaine  patches yesterday. Patient did not want the oxycodone  offered.  - continue gabapentin , cymbalta , capsaicin , voltaren  gel, lidocaine  patches, and scheduled tylenol  - PT/OT  - HH PT - OP optimization of pain

## 2024-10-30 NOTE — Plan of Care (Signed)

## 2024-10-30 NOTE — Assessment & Plan Note (Signed)
 Hgb remains stable. 9.6 today.  - PPI BID for 30 days then once daily for GI protection - recommended ESA with nephrology outpatient - no CBC tomorrow

## 2024-10-30 NOTE — Progress Notes (Addendum)
 D/c orders noted. Contacted out-pt HD clinic, [facility] home therapies dept, to inform of potential d/c and anticipated arrival on Monday. She will need too come in 1130 for paperwork for 1215 chairtime. AVS updated to reflect change. Called daughter and let her know time change as well, they are agreeable. Will continue to assist as needed.   Lavanda Geneva Barrero Dialysis Navigator [phone]

## 2024-10-30 NOTE — Discharge Summary (Signed)
 Family [facility] Discharge Summary  Patient name: Kimberly Carlson Medical record number: [id number] Date of birth: 16-May-1946 Age: 78 y.o. Gender: female Date of Admission: 10/25/2024  Date of Discharge: 10/30/24 Admitting Physician: Kimberly KATHEE Samuels, DO  Primary Care Provider: Howell Lunger, DO Consultants: nephrology, Kimberly Carlson  Indication for Hospitalization: symptomatic anemia  Discharge Diagnoses/Problem List:  Principal Problem:   Acute on chronic anemia Active Problems:   ESRD (end stage renal disease) on dialysis (HCC)   Leg pain   Symptomatic anemia   Uremia   Uncontrolled type 2 diabetes mellitus with hyperglycemia, with long-term current use of insulin  [facility])  Brief Carlson Course:  Kimberly Carlson is a 78 y.o.female with a history of  CKD, CAD not currently on dialysis who was admitted to the Kimberly Carlson at [facility] for Anemia. Her Carlson course is detailed below:  Acute on chronic anemia She previously received an ESA through nephrology, but this was discontinued when hemoglobin improved to 11.  She was seen by her outpatient nephrology clinic with fatigue, lab work obtained, and was notified by nephrology clinic RN that her HgB was 6.0 and was instructed to get to Kimberly [facility]son or call 911 if become symptomatic. Kimberly Carlson present at the Kimberly Carlson with weakness, fatigue, HA, and SOB. No melena, hematochezia, hematemesis, hematuria or other source of bleeding.  In Kimberly [facility]son, she was hemodynamically stable.  Her Hgb/Hct 6.4/20.5. 2 u pRBC were given with improvement to 8.9 on recheck. Patient was admitted to the floor. BUN elevated at 164.  Iron 45, saturation 14.  Ferritin elevated at 546, consistent with anemia of chronic disease.  Reticulocytes elevated 5.0.  Component of anemia suspected due to her anemia of chronic disease, but with the significant drop in hemoglobin, drop in iron, elevated BUN, concern for Kimberly Carlson bleed.  Patient without history of prior  colonoscopy. IV PPI was initiated.  Kimberly Carlson was consulted, but patient was not interested in pursuing colonoscopy or endoscopy, so further evaluation of Kimberly Carlson bleed was discontinued.  PPI was transition to oral, and patient's home aspirin  for CAD was restarted.  Hemoglobin remained stable after reinitiation.  Nephrology also consulted, and recommended initiation of darbepoetin alpha to be given weekly; however, patient declined citing prior ESA use outpatient. Recommend ESA outpatient for chronic anemia. Patient's hemoglobin stable for several days following transfusions and ASA reinitiation.   CKD Patient with known history of CKD stage V, followed with Kimberly Carlson kidney outpatient.  Outpatient discussion of dialysis, but patient had not pursued vascular catheter access thus far.  Nephrology consulted as above, and patient interested in starting dialysis.  TDC placed and hemodialysis initiated on 10/27/2024. AVF not planned at time of discharge as patient considering home PD. Patient CLIP'd to Kimberly [facility]son at the [facility] location on [facility] OP HD in the interim.   TDC oozing TDC placed on 10/27/24 for HD. On 12/4, site had gradual ooze of blood requiring dressing change. Surgicel dressing applied, heparin  DVT prophylaxis held, and patient's oozing discontinued. Hgb remained stable and no bleeding was evident subsequently.  Diastolic hypotension Patient with diastolic hypotension during admission. BP meds held. After initiation of HD, nephrology recommended discontinuing BP meds all together. Additionally, coreg  reduced to 6.25 mg BID given hypotension. Nephrology recommendations for continued torsemide  100 mg on non-dialysis days.   Leg pain Patient with worsening, burning, diffuse bilateral leg pain previously noted at outpatient visits. No focal exam findings. Suspect peripheral neuropathy as etiology for pain. Increased gabapentin , and  started cymbalta , voltaren  gel, lidocaine  patches,  capsaicin  cream. Patient with some improvement, but not resolution, of pain. Able to ambulate with PT, and discharged home with Kimberly Carlson.   Other chronic conditions were medically managed with home medications and formulary alternatives as necessary (T2DM, HTN)  Kimberly Carlson Follow-up Recommendations: Repeat CBC to assess hemoglobin Encourage ESA outpatient  Assess insulin  needs, as basal reduced to 20 units daily during admission Discharged with BID PPI for one month then recommend once daily PPI Consider dose adjustment of coreg  as appropriate    Results/Tests Pending at Time of Discharge:  Unresulted Labs (From admission, onward)    None        Disposition: home with Kimberly Carlson  Discharge Condition: stable  Discharge Exam:  Vitals:   10/30/24 0422 10/30/24 0804  BP: (!) 142/51 (!) 119/48  Pulse: 70 73  Resp: 20 18  Temp: 98.2 F (36.8 C) 98.6 F (37 C)  SpO2: 98% 98%   General: well appearing female lying in bed Cardiovascular: RRR, systolic murmur Respiratory: CTAB, no accessory muscle use Abdomen: soft, nontender Extremities: active symmetric ROM of the ankles and toes, no focal knee, ankle or toe warmth, effusion, or erythema  Significant Procedures: TDC placement by Kimberly Carlson on 10/27/24  Significant Labs and Imaging:  Recent Labs  Lab 10/29/24 0452 10/30/24 0530  WBC 9.9 10.4  HGB 9.6* 9.6*  HCT 29.6* 28.8*  PLT 104* 85*   Recent Labs  Lab 10/29/24 0452 10/30/24 0530  NA 135 138  K 4.1 4.4  CL 97* 95*  CO2 26 25  GLUCOSE 104* 88  BUN 56* 74*  CREATININE 3.79* 4.66*  CALCIUM  9.1 9.7  MG 1.8 2.0  PHOS 5.8* 6.5*  ALBUMIN 3.3* 3.1*   Kimberly Carlson TUNNELED CENTRAL VENOUS CATH PLC W IMG Result Date: 10/27/2024 INDICATION: End-stage renal disease, no current access to begin dialysis EXAM: ULTRASOUND GUIDANCE FOR VASCULAR ACCESS RIGHT INTERNAL JUGULAR PERMANENT HEMODIALYSIS CATHETER Date:  10/27/2024 10/27/2024 10:39 am Radiologist:  CHRISTELLA. Frederic Specking, MD Guidance:  Ultrasound and  fluoroscopic FLUOROSCOPY: Fluoroscopy Time: 0 minutes 48 seconds (3 mGy). MEDICATIONS: 1 g vancomycin  within 1 hour of the procedure ANESTHESIA/SEDATION: Versed  1.0 mg IV; Fentanyl  25 mcg IV; Moderate Sedation Time:  18 minute The patient was continuously monitored during the procedure by the interventional radiology nurse under my direct supervision. CONTRAST:  None. COMPLICATIONS: None immediate. PROCEDURE: Informed consent was obtained from the patient following explanation of the procedure, risks, benefits and alternatives. The patient understands, agrees and consents for the procedure. All questions were addressed. A time out was performed. Maximal barrier sterile technique utilized including caps, mask, sterile gowns, sterile gloves, large sterile drape, hand hygiene, and 2% chlorhexidine  scrub. Under sterile conditions and local anesthesia, right internal jugular micropuncture venous access was performed with ultrasound. Images were obtained for documentation of the patent right internal jugular vein. A guide wire was inserted followed by a transitional dilator. Next, a 0.035 guidewire was advanced into the IVC with a 5-French catheter. Measurements were obtained from the right venotomy site to the proximal right atrium. In the right infraclavicular chest, a subcutaneous tunnel was created under sterile conditions and local anesthesia. 1% lidocaine  with epinephrine  was utilized for this. The 19 cm tip to cuff palindrome catheter was tunneled subcutaneously to the venotomy site and inserted into the SVC/RA junction through a valved peel-away sheath. Position was confirmed with fluoroscopy. Images were obtained for documentation. Blood was aspirated from the catheter followed by saline and heparin  flushes. The  appropriate volume and strength of heparin  was instilled in each lumen. Caps were applied. The catheter was secured at the tunnel site with Gelfoam and a pursestring suture. The venotomy site was closed  with subcuticular Vicryl suture. Dermabond was applied to the small right neck incision. A dry sterile dressing was applied. The catheter is ready for use. No immediate complications. IMPRESSION: Ultrasound and fluoroscopically guided right internal jugular tunneled hemodialysis catheter (19 cm tip to cuff palindrome catheter). Electronically Signed   By: CHRISTELLA.  Shick M.D.   On: 10/27/2024 10:58      Discharge Medications:  Allergies as of 10/30/2024       Reactions   Amlodipine  Swelling   Bilateral leg swelling with multiple doses including 2.5mg  daily.  Stopped therapy and edema improved.    Augmentin  [amoxicillin -pot Clavulanate] Diarrhea   Clonidine  Derivatives Other (See Comments)   Light-headedness, dizzy, extreme dry mouth   Doxycycline  Nausea And Vomiting   Metformin Diarrhea        Medication List     PAUSE taking these medications    calcium -vitamin D  500-200 MG-UNIT tablet Wait to take this until your doctor or other care provider tells you to start again. Commonly known as: Oscal 500/200 D-3 Take 2 tablets by mouth daily with breakfast.       STOP taking these medications    doxazosin  4 MG tablet Commonly known as: CARDURA    furosemide  40 MG tablet Commonly known as: LASIX    hydrALAZINE  100 MG tablet Commonly known as: APRESOLINE    spironolactone  50 MG tablet Commonly known as: ALDACTONE        TAKE these medications    Accu-Chek Guide Me w/Device Kit Use to check blood sugar 6x per day. E11.65   Accu-Chek Guide Test test strip Generic drug: glucose blood USE TO CHECK BLOOD SUGAR SIX TIMES PER DAY   Accu-Chek Softclix Lancets lancets USE TO CHECK BLOOD SUGAR 6 TIMES PER DAY   acetaminophen  500 MG tablet Commonly known as: TYLENOL  Take 1,000 mg by mouth every 6 (six) hours as needed for mild pain (pain score 1-3) (or headaches).   aspirin  81 MG chewable tablet Chew 81 mg by mouth daily.   atorvastatin  40 MG tablet Commonly known as:  LIPITOR TAKE 1 TABLET EVERY DAY What changed: when to take this   capsaicin  0.025 % cream Commonly known as: ZOSTRIX Apply topically 2 (two) times daily.   carvedilol  6.25 MG tablet Commonly known as: Coreg  Take 1 tablet (6.25 mg total) by mouth 2 (two) times daily. What changed:  how much to take Another medication with the same name was removed. Continue taking this medication, and follow the directions you see here.   diclofenac  Sodium 1 % Gel Commonly known as: VOLTAREN  Apply 2 g topically 2 (two) times daily.   DULoxetine  30 MG capsule Commonly known as: CYMBALTA  Take 1 capsule (30 mg total) by mouth daily. Start taking on: October 31, 2024   gabapentin  100 MG capsule Commonly known as: NEURONTIN  Take 2 capsules (200 mg total) by mouth at bedtime. What changed: how much to take   insulin  glargine 100 UNIT/ML injection Commonly known as: LANTUS  INJECT 20 UNITS UNDER THE SKIN EVERY DAY (DISCARD VIAL 28 DAYS AFTER OPENING) What changed: additional instructions   Insulin  Syringe-Needle U-100 31G X 5/16 1 ML Misc Commonly known as: Droplet Insulin  Syringe Inject 1 Lancet into the skin daily.   pantoprazole  40 MG tablet Commonly known as: PROTONIX  Take 1 tablet (40 mg total) by  mouth 2 (two) times daily for 27 days, THEN 1 tablet (40 mg total) daily. Start taking on: October 30, 2024   polyethylene glycol powder 17 GM/SCOOP powder Commonly known as: GLYCOLAX /MIRALAX  Take 17 g by mouth daily. Dissolve 1 capful (17g) in 4-8 ounces of liquid and take by mouth daily.   senna 8.6 MG Tabs tablet Commonly known as: SENOKOT Take 1 tablet (8.6 mg total) by mouth daily.   torsemide  100 MG tablet Commonly known as: DEMADEX  Take 1 tablet (100 mg total) by mouth as directed. Take only on NON-dialysis days What changed:  when to take this additional instructions   Vitamin D3 125 MCG (5000 UT) Caps Take 5,000 Units by mouth daily.               Durable Medical  Equipment  (From admission, onward)           Start     Ordered   10/29/24 1216  For home use only DME Bedside commode  Once       Comments: Please provide 3:1 - patient unable to mobilize to bathroom facilities at home.  Question:  Patient needs a bedside commode to treat with the following condition  Answer:  ESRD (end stage renal disease) (HCC)   10/29/24 1216   10/29/24 1202  For home use only DME Other see comment  Once       Comments: Please provide transport chair.  Patient with acute on chronic anemia, ESRD.  Question:  Length of Need  Answer:  Lifetime   10/29/24 1202            Discharge Instructions: Please refer to Patient Instructions section of EMR for full details.  Patient was counseled important signs and symptoms that should prompt return to medical care, changes in medications, dietary instructions, activity restrictions, and follow up appointments.   Follow-Up Appointments:  Contact information for follow-up providers     [facility] Go on 10/29/2024.   Why: Please arrive 1pm for first appointment.   You will be on a monday, tuesday, thursday, friday schedule after. Will discuss preference times for these future appointments with you at clinic on 12/4 at first appt. Contact information: [address] [place] [address] [phone]         [facility] (DME) Follow up.   Specialty: DME Services Why: [facility] will provide a transport chair for home. Contact information: [address] [employer] [place]  [address] [phone]             Contact information for after-discharge care     Home Medical Care     [facility]) .   [facility]: Home Health Services Contact information: [address] [place]  [address] [phone]                     Alena Morrison, Elio, MD 10/30/2024, 12:16 PM PGY-1, [facility]

## 2024-10-30 NOTE — Progress Notes (Signed)
 [facility] Progress Note  Name: Kimberly Carlson MRN: [id number] DOB: 1946/11/04  Chief Complaint:  Shortness of breath and weakness  Subjective:  She had 100 mL UOP over 12/4 charted. Last HD on 12/3 with 0.2 kg UF.  She states that she just saw therapy and that she wasn't able to walk with them.  She states that she is going home and her daughter is going to help her.  She shows me that they have just delivered a wheelchair for her to use.    Review of systems:     Denies shortness of breath or chest pain  No n/v Maybe less dizzy Continued lower leg tenderness - right > left  ------------------- Background on consult:  Nasirah E Carlson is a 78 y.o. female with a history of CKD stage V, diabetes mellitus, distant cocaine use, and hypertension who presented to the hospital with shortness of breath, weakness, and fatigue.  She was found to have acute on chronic anemia and is suspected to have a component of CKD contributing to her anemia.  They have also consulted GI due to concern for GI bleed.  Team is requesting input specifically on whether or not she would be appropriate for ESA.  Hemoglobin was 6.4 yesterday and she received 2 units of packed red blood cells.  For reference, the patient follows with Dr. Tobie outpatient and on 09/23/2024, her creatinine was 3.96 and BUN was 100.  At that appointment, she deferred a referral to [facility].  She was reported to be prescribed Lasix  40 mg twice a day but was just taking it daily per his note.  She was previously on ESA outpatient through our office however Dr. Anthony note indicates that this had been held as her hemoglobin had improved to greater than 11.  She had one unmeasured urine void over 11/30 charted.  Nephrology is consulted for assistance with management of advanced CKD and anemia.  She denies any blood per rectum or dark stools.  She has recently used a cane to walk around but prior to feeling poorly she did not even  need this.  She and I discussed the risks, benefits, and indications for dialysis and she does consent to dialysis.  She had been hoping to avoid dialysis.  She was worried about how she would get back and forth to dialysis and I let her know there are programs that offer assistance with this.  She asks if it is possible to do dialysis at home.  We discussed types of dialysis.      Intake/Output Summary (Last 24 hours) at 10/30/2024 1014 Last data filed at 10/29/2024 2300 Gross per 24 hour  Intake 340 ml  Output 100 ml  Net 240 ml    Vitals:  Vitals:   10/30/24 0007 10/30/24 0422 10/30/24 0500 10/30/24 0804  BP: (!) 143/48 (!) 142/51  (!) 119/48  Pulse: 69 70  73  Resp: 20 20  18   Temp: 98.1 F (36.7 C) 98.2 F (36.8 C)  98.6 F (37 C)  TempSrc: Oral Oral    SpO2: 98% 98%  98%  Weight:   78.8 kg   Height:         Physical Exam:    General elderly female in bed in no acute distress HEENT normocephalic atraumatic extraocular movements intact sclera anicteric Neck supple trachea midline Lungs clear to auscultation bilaterally normal work of breathing at rest on room air Heart S1S2 no rub Abdomen soft nontender nondistended Extremities  no edema appreciated but right leg is tender to palpation  Psych normal mood and affect Neuro alert and oriented x 3 provides hx and follows commands  Access RIJ tunneled catheter    Medications reviewed   Labs:     Latest Ref Rng & Units 10/30/2024    5:30 AM 10/29/2024    4:52 AM 10/28/2024    4:27 AM  BMP  Glucose 70 - 99 mg/dL 88  895  860   BUN 8 - 23 mg/dL 74  56  79   Creatinine 0.44 - 1.00 mg/dL 5.33  6.20  6.57   Sodium 135 - 145 mmol/L 138  135  137   Potassium 3.5 - 5.1 mmol/L 4.4  4.1  4.2   Chloride 98 - 111 mmol/L 95  97  96   CO2 22 - 32 mmol/L 25  26  28    Calcium  8.9 - 10.3 mg/dL 9.7  9.1  9.2      Assessment/Plan:    # CKD stage V with progression to ESRD - started HD on 12/2 after tunneled catheter with IR.   Holding off on AVF as she is considering whether PD would be the best option for her - Next HD on 12/5, Friday with tight heparin  - Strict ins/outs and daily weights  - She has been CLIP'd to the TCU at the [facility] location on a Mon Tues Thurs and Friday schedule.  She is considering dialysis at home, perhaps PD.  - If this leg pain causes her to go to SNF, will need to re-CLIP so will have a low threshold to re-CLIP.  At this time, they state her plan is to go home. Seeing therapy and working on mobility  - Will need tight heparin  with HD outpatient due to clotting - dialysis educator consulted.    # Anemia - acute on chronic - concern for acute blood loss and also with contribution from CKD  - Recently received ESA outpatient.  Last dose of retacrit  10/20/24.  Outpatient she cane resume ESA.  Would start mircera 100 mcg every 2 weeks on 11/03/24 (nephrology will take care of this) - PRBC's per primary team  - appreciate GI.  She declined an invasive GI evaluation for anemia with EGD and colonoscopy - reassess outpatient - she is on a PPI   # HTN  - acceptable control and actually a bit low - Reduced carvedilol  to 12.5 mg BID - Multiple home meds are intentionally off - would discharge on her current regimen (do not resume hydralazine , spironolactone ,or doxazosin )  - on discharge can resume torsemide  100 mg in the AM's on non-dialysis days    # Metabolic acidosis - Started HD  - trend renal panel    Disposition - per primary team but mobility is a significant issue.  If she is ultimately redirected to a SNF would need to CLIP to another HD unit    Katheryn JAYSON Saba, MD 10/30/2024 10:14 AM

## 2024-10-30 NOTE — Progress Notes (Signed)
 Physical Therapy Treatment Patient Details Name: Kimberly Carlson MRN: [id number] DOB: 1946/05/02 Today's Date: 10/30/2024   History of Present Illness 78 yo female present with weakness and SOB hbg 6.0 pt with workup for GIB.   PMH CKD V chronic anemia, DM2, HTN, HLD, CAD.    PT Comments  Pt demonstrating slightly improved pain control and activity tolerance today. She is not quite as tender to touch and reports 5/10 left ankle and 8/10 right ankle pain. She was received on bedside commode. Verbal instruction and demonstration was provided on transport chair parts and management. Pt able to perform lower body dressing (donning underwear) with steadying assist while hovering over BSC and then performed squat pivot transfer from [facility] to transport chair. Additional stand from transport chair with cues for hand placement up to RW with minA and able to take pivotal steps from chair back into bed. HHPT remains appropriate.     If plan is discharge home, recommend the following: A little help with walking and/or transfers;A little help with bathing/dressing/bathroom;Help with stairs or ramp for entrance   Can travel by private vehicle        Equipment Recommendations  Other (comment);BSC/3in1 (transport chair)    Recommendations for Other Services       Precautions / Restrictions Precautions Precautions: Fall Recall of Precautions/Restrictions: Intact Restrictions Weight Bearing Restrictions Per Provider Order: No     Mobility  Bed Mobility Overal bed mobility: Modified Independent             General bed mobility comments: increased time and effort for sit > supine    Transfers Overall transfer level: Needs assistance Equipment used: 1 person hand held assist, Rolling walker (2 wheels) Transfers: Bed to chair/wheelchair/BSC, Sit to/from Stand Sit to Stand: Min assist   Step pivot transfers: Min assist Squat pivot transfers: Min assist     General transfer comment:  Light minA for squat pivot from [facility] to transfer chair with guidance at hips and cues for reaching with L hand to facilitate transfer towards L. Pt performed additional sit to stand from transfer chair with cues for hand placement, step pivot from chair to bed with increased time and crouched posture    Ambulation/Gait                   Stairs             Wheelchair Mobility     Tilt Bed    Modified Rankin (Stroke Patients Only)       Balance Overall balance assessment: Needs assistance   Sitting balance-Leahy Scale: Good     Standing balance support: Bilateral upper extremity supported, During functional activity, Reliant on assistive device for balance Standing balance-Leahy Scale: Fair Standing balance comment: reliant on RW support dynamically for LLE offloading                            Communication Communication Communication: No apparent difficulties  Cognition Arousal: Alert Behavior During Therapy: WFL for tasks assessed/performed   PT - Cognitive impairments: No apparent impairments                         Following commands: Intact      Cueing Cueing Techniques: Verbal cues, Tactile cues  Exercises      General Comments        Pertinent Vitals/Pain Pain Assessment Pain Assessment: 0-10 Pain Score: 5  Pain Location: 5/10 left ankle 8/10 right ankle Pain Descriptors / Indicators: Aching, Guarding Pain Intervention(s): Limited activity within patient's tolerance, Monitored during session, Premedicated before session    Home Living                          Prior Function            PT Goals (current goals can now be found in the care plan section) Acute Rehab PT Goals Patient Stated Goal: return to independent PT Goal Formulation: With patient/family Time For Goal Achievement: 11/09/24 Potential to Achieve Goals: Good Progress towards PT goals: Progressing toward goals    Frequency    Min  2X/week      PT Plan      Co-evaluation              AM-PAC PT 6 Clicks Mobility   Outcome Measure  Help needed turning from your back to your side while in a flat bed without using bedrails?: None Help needed moving from lying on your back to sitting on the side of a flat bed without using bedrails?: None Help needed moving to and from a bed to a chair (including a wheelchair)?: A Little Help needed standing up from a chair using your arms (e.g., wheelchair or bedside chair)?: A Little Help needed to walk in hospital room?: Total Help needed climbing 3-5 steps with a railing? : Total 6 Click Score: 16    End of Session Equipment Utilized During Treatment: Gait belt Activity Tolerance: Patient tolerated treatment well Patient left: with call bell/phone within reach;in chair;with nursing/sitter in room Nurse Communication: Mobility status PT Visit Diagnosis: Other abnormalities of gait and mobility (R26.89);Muscle weakness (generalized) (M62.81)     Time: 0950-1010 PT Time Calculation (min) (ACUTE ONLY): 20 min  Charges:    $Therapeutic Activity: 8-22 mins PT General Charges $$ ACUTE PT VISIT: 1 Visit                     Aleck Daring, PT, DPT Acute Rehabilitation Services Office [phone]    [name] 10/30/2024, 10:15 AM

## 2024-10-30 NOTE — Plan of Care (Signed)
 [facility]  Initial Hemodialysis Orders Dialysis center: TCU   Patient's name: Shereta E Wrenn DOB: 1946-06-12 ESRD  Discharge diagnosis: Progressive CKD -->ESRD, starting dialysis Symptomatic anemia s/p transfusion   Allergies:  Allergies  Allergen Reactions   Amlodipine  Swelling    Bilateral leg swelling with multiple doses including 2.5mg  daily.  Stopped therapy and edema improved.    Augmentin  [Amoxicillin -Pot Clavulanate] Diarrhea   Clonidine  Derivatives Other (See Comments)    Light-headedness, dizzy, extreme dry mouth   Doxycycline  Nausea And Vomiting   Metformin Diarrhea   Date of First Dialysis: 10/27/24  Cause of renal disease: DM, HTN   Dialysis Prescription: Dialysis Frequency:  Per TCU protocol  Tx duration: Per TCU protocol  EDW: 79 kg   Dialyzer: 180NRe UF profile/Sodium modeling?: -- Dialysis Bath: 2 K 2 Ca  Dialysis access: Access type: R internal jugular TDC    Date placed: 10/27/24           Surgeon: IR  In Center Medications: Heparin  Dose: Tight bolus    VDRA: per protocol  Venofer: per protocol  Mircera: 100 mcg IV q 2 wks Next dose due: 11/03/24  Discharge labs: Hgb: 9.6 K+: 4.4        Ca: 9.7  Phos: 6.5 Alb: 3.1  Please draw routine monthly labs.   Maisie Ronnald Acosta PA-C

## 2024-10-30 NOTE — Assessment & Plan Note (Addendum)
 Will start scheduled bowel regimen as no BM since Saturday, per patient report DM 2 : AM fasting glucose 115. Glucose 88-150. Continue lantus  at reduced 20 units.  Continue CBG ACHS HTN : Given persistent diastolic hypotension, and initiation of dialysis, will Continue to Hold Hydralazine  50 mg QID and Doxazosin , Torsemide  100 mg Daily HLD : continue Home Lipitor 40 mg Daily  CAD : home ASA 81 mg daily, decrease Coreg  to 6.25 mg BID for diastolic hypotension

## 2024-10-30 NOTE — TOC Transition Note (Signed)
 Transition of Care [facility]) - Discharge Note   Patient Details  Name: Kimberly Carlson MRN: [id number] Date of Birth: 1946/07/16  Transition of Care [facility]) CM/SW Contact:  Rosaline JONELLE Joe, RN Phone Number: 10/30/2024, 2:13 PM   Clinical Narrative:    CM met with the patient and daughter at the bedside prior to discharge to home.  [facility] was notified and they will follow up for start of services.  Patient has transport chair and bedside commode in the room to take home.  Daughter plans to take the patient home by car today.         Patient Goals and CMS Choice            Discharge Placement                       Discharge Plan and Services Additional resources added to the After Visit Summary for                                       Social Drivers of Health (SDOH) Interventions SDOH Screenings   Food Insecurity: No Food Insecurity (10/25/2024)  Housing: Low Risk  (10/25/2024)  Transportation Needs: No Transportation Needs (10/25/2024)  Utilities: Not At Risk (10/25/2024)  Depression (PHQ2-9): Low Risk  (07/06/2024)  Financial Resource Strain: Low Risk  (06/23/2024)  Physical Activity: Insufficiently Active (06/23/2024)  Social Connections: Moderately Isolated (10/25/2024)  Stress: No Stress Concern Present (06/23/2024)  Tobacco Use: Medium Risk (10/25/2024)  Health Literacy: Adequate Health Literacy (12/12/2023)     Readmission Risk Interventions    10/28/2024   12:08 PM  Readmission Risk Prevention Plan  Transportation Screening Complete  PCP or Specialist Appt within 5-7 Days Complete  Home Care Screening Complete

## 2024-10-30 NOTE — Plan of Care (Signed)
 Pt discharged home with self care and assistance of family. Discharge paperwork and medications reviewed with patient. Prescriptions filled at [facility] obtained and at bedside for pt to take home. Wheelchair and bedside commode at bedside for home use. PIV removed. HD cath site clean dry and intact, heparin  locked, with dead end cap in place to both lumens. Vital signs stable prior to discharge. Pt denies any pain or discomfort. Assisted pt putting on clothes, assisted pt to her home wheelchair. All personal belongings at bedside gathered and placed into pt belongings bag. Family members assisted RN in checking room to ensure all personal belongings obtained prior to discharge. Family escorting pt to private vehicle.   Problem: Education: Goal: Knowledge of General Education information will improve Description: Including pain rating scale, medication(s)/side effects and non-pharmacologic comfort measures 10/30/2024 2138 by Neda Rosaline LABOR, RN Outcome: Completed/Met 10/30/2024 2138 by Neda Rosaline LABOR, RN Outcome: Progressing   Problem: Health Behavior/Discharge Planning: Goal: Ability to manage health-related needs will improve 10/30/2024 2138 by Neda Rosaline LABOR, RN Outcome: Completed/Met 10/30/2024 2138 by Neda Rosaline LABOR, RN Outcome: Progressing   Problem: Clinical Measurements: Goal: Ability to maintain clinical measurements within normal limits will improve 10/30/2024 2138 by Neda Rosaline LABOR, RN Outcome: Completed/Met 10/30/2024 2138 by Neda Rosaline LABOR, RN Outcome: Progressing Goal: Will remain free from infection 10/30/2024 2138 by Neda Rosaline LABOR, RN Outcome: Completed/Met 10/30/2024 2138 by Neda Rosaline LABOR, RN Outcome: Progressing Goal: Diagnostic test results will improve 10/30/2024 2138 by Neda Rosaline LABOR, RN Outcome: Completed/Met 10/30/2024 2138 by Neda Rosaline LABOR, RN Outcome: Progressing Goal: Respiratory complications will improve 10/30/2024 2138 by Neda Rosaline LABOR, RN Outcome: Completed/Met 10/30/2024 2138 by Neda Rosaline LABOR, RN Outcome: Progressing Goal: Cardiovascular complication will be avoided 10/30/2024 2138 by Neda Rosaline LABOR, RN Outcome: Completed/Met 10/30/2024 2138 by Neda Rosaline LABOR, RN Outcome: Progressing   Problem: Activity: Goal: Risk for activity intolerance will decrease 10/30/2024 2138 by Neda Rosaline LABOR, RN Outcome: Completed/Met 10/30/2024 2138 by Neda Rosaline LABOR, RN Outcome: Progressing   Problem: Nutrition: Goal: Adequate nutrition will be maintained 10/30/2024 2138 by Neda Rosaline LABOR, RN Outcome: Completed/Met 10/30/2024 2138 by Neda Rosaline LABOR, RN Outcome: Progressing   Problem: Coping: Goal: Level of anxiety will decrease 10/30/2024 2138 by Neda Rosaline LABOR, RN Outcome: Completed/Met 10/30/2024 2138 by Neda Rosaline LABOR, RN Outcome: Progressing   Problem: Elimination: Goal: Will not experience complications related to bowel motility 10/30/2024 2138 by Neda Rosaline LABOR, RN Outcome: Completed/Met 10/30/2024 2138 by Neda Rosaline LABOR, RN Outcome: Progressing Goal: Will not experience complications related to urinary retention 10/30/2024 2138 by Neda Rosaline LABOR, RN Outcome: Completed/Met 10/30/2024 2138 by Neda Rosaline LABOR, RN Outcome: Progressing   Problem: Pain Managment: Goal: General experience of comfort will improve and/or be controlled 10/30/2024 2138 by Neda Rosaline LABOR, RN Outcome: Completed/Met 10/30/2024 2138 by Neda Rosaline LABOR, RN Outcome: Progressing   Problem: Safety: Goal: Ability to remain free from injury will improve 10/30/2024 2138 by Neda Rosaline LABOR, RN Outcome: Completed/Met 10/30/2024 2138 by Neda Rosaline LABOR, RN Outcome: Progressing   Problem: Skin Integrity: Goal: Risk for impaired skin integrity will decrease 10/30/2024 2138 by Neda Rosaline LABOR, RN Outcome: Completed/Met 10/30/2024 2138 by Neda Rosaline LABOR, RN Outcome: Progressing   Problem:  Education: Goal: Knowledge of disease and its progression will improve 10/30/2024 2138 by Neda Rosaline LABOR, RN Outcome: Completed/Met 10/30/2024 2138 by Neda Rosaline LABOR, RN Outcome: Progressing Goal: Individualized Educational Video(s) 10/30/2024 2138 by Neda Rosaline LABOR,  RN Outcome: Completed/Met 10/30/2024 2138 by Neda Rosaline LABOR, RN Outcome: Progressing   Problem: Fluid Volume: Goal: Compliance with measures to maintain balanced fluid volume will improve 10/30/2024 2138 by Neda Rosaline LABOR, RN Outcome: Completed/Met 10/30/2024 2138 by Neda Rosaline LABOR, RN Outcome: Progressing   Problem: Health Behavior/Discharge Planning: Goal: Ability to manage health-related needs will improve 10/30/2024 2138 by Neda Rosaline LABOR, RN Outcome: Completed/Met 10/30/2024 2138 by Neda Rosaline LABOR, RN Outcome: Progressing   Problem: Nutritional: Goal: Ability to make healthy dietary choices will improve 10/30/2024 2138 by Neda Rosaline LABOR, RN Outcome: Completed/Met 10/30/2024 2138 by Neda Rosaline LABOR, RN Outcome: Progressing   Problem: Clinical Measurements: Goal: Complications related to the disease process, condition or treatment will be avoided or minimized 10/30/2024 2138 by Neda Rosaline LABOR, RN Outcome: Completed/Met 10/30/2024 2138 by Neda Rosaline LABOR, RN Outcome: Progressing

## 2024-10-30 NOTE — Procedures (Signed)
 Received patient in bed to unit.  Alert and oriented.  Informed consent signed and in chart.   TX duration: 3hrs  Patient tolerated well.  Transported back to the room  Alert, without acute distress.  Hand-off given to patient's nurse.   Access used: R chest catheter Access issues: None  Total UF removed: 0 Medication(s) given: None Post HD weight: 82.0kg Post HD VS: 132/54   Iverna Hammac [facility]

## 2024-11-02 ENCOUNTER — Telehealth: Payer: Self-pay

## 2024-11-02 ENCOUNTER — Telehealth: Payer: Self-pay | Admitting: Nephrology

## 2024-11-02 NOTE — Telephone Encounter (Signed)
 Transition of care contact from inpatient facility  Date of Discharge: 10/30/24 Date of Contact: 11/02/24 Method of contact: Phone  Attempted to contact patient to discuss transition of care from inpatient admission. Patient did not answer the phone. Message was left on the patient's voicemail. Will continue outreach attempts. Patient did not go to dialysis today due to weather.

## 2024-11-02 NOTE — Transitions of Care (Post Inpatient/ED Visit) (Signed)
   11/02/2024  Name: Kimberly Carlson MRN: [id number] DOB: [birth year]  Today's TOC FU Call Status: Today's TOC FU Call Status:: Unsuccessful Call (1st Attempt) Unsuccessful Call (1st Attempt) Date: 11/02/24  Attempted to reach the patient regarding the most recent Inpatient/ED visit.  Follow Up Plan: Additional outreach attempts will be made to reach the patient to complete the Transitions of Care (Post Inpatient/ED visit) call.    Bing Edison MSN, RN RN [employer] [facility]  [facility] Office Hours M-F [phone] Direct Dial: [phone] Main Phone [phone]  Fax: [phone] [email or web address]

## 2024-11-03 ENCOUNTER — Inpatient Hospital Stay (HOSPITAL_COMMUNITY): Admission: RE | Admit: 2024-11-03 | Source: Ambulatory Visit

## 2024-11-03 ENCOUNTER — Telehealth: Payer: Self-pay

## 2024-11-03 ENCOUNTER — Ambulatory Visit: Payer: Self-pay | Admitting: Student

## 2024-11-03 NOTE — Transitions of Care (Post Inpatient/ED Visit) (Signed)
   11/03/2024  Name: Kimberly Carlson MRN: [id number] DOB: October 29, 1946  Today's TOC FU Call Status: Today's TOC FU Call Status:: Unsuccessful Call (2nd Attempt) Unsuccessful Call (2nd Attempt) Date: 11/03/24  Attempted to reach the patient regarding the most recent Inpatient/ED visit.  Follow Up Plan: Additional outreach attempts will be made to reach the patient to complete the Transitions of Care (Post Inpatient/ED visit) call.   Alan Ee, RN, BSN, CEN [employer]- Transition of Care Team.  [facility] [phone]

## 2024-11-04 ENCOUNTER — Telehealth: Payer: Self-pay

## 2024-11-04 NOTE — Transitions of Care (Post Inpatient/ED Visit) (Signed)
° °  11/04/2024  Name: Kimberly Carlson MRN: [id number] DOB: 10/11/1946  Today's TOC FU Call Status: Today's TOC FU Call Status:: Unsuccessful Call (3rd Attempt) Unsuccessful Call (3rd Attempt) Date: 11/04/24  Attempted to reach the patient regarding the most recent Inpatient/ED visit.  Follow Up Plan: No further outreach attempts will be made at this time. We have been unable to contact the patient.  Alan Ee, RN, BSN, CEN [employer]- Transition of Care Team.  [facility] [phone]

## 2024-11-05 ENCOUNTER — Encounter (HOSPITAL_COMMUNITY): Payer: Self-pay

## 2024-11-05 ENCOUNTER — Encounter (HOSPITAL_COMMUNITY): Payer: Self-pay | Admitting: Nephrology

## 2024-11-17 ENCOUNTER — Encounter (HOSPITAL_COMMUNITY)

## 2024-12-11 ENCOUNTER — Other Ambulatory Visit: Payer: Self-pay

## 2024-12-11 MED ORDER — PANTOPRAZOLE SODIUM 40 MG PO TBEC: DELAYED_RELEASE_TABLET | ORAL | 0 refills | Status: AC

## 2024-12-11 MED ORDER — DULOXETINE HCL 30 MG PO CPEP: 30.0000 mg | ORAL_CAPSULE | Freq: Every day | ORAL | 0 refills | Status: AC

## 2024-12-11 MED ORDER — CARVEDILOL 6.25 MG PO TABS: 6.2500 mg | ORAL_TABLET | Freq: Two times a day (BID) | ORAL | 0 refills | Status: AC

## 2024-12-16 ENCOUNTER — Other Ambulatory Visit (HOSPITAL_COMMUNITY): Payer: Self-pay | Admitting: Nephrology

## 2024-12-16 ENCOUNTER — Telehealth (HOSPITAL_COMMUNITY): Payer: Self-pay

## 2024-12-16 DIAGNOSIS — N186 End stage renal disease: Secondary | ICD-10-CM

## 2024-12-16 NOTE — Telephone Encounter (Signed)
LMOM for pt to callback for scheduling

## 2024-12-23 ENCOUNTER — Ambulatory Visit (HOSPITAL_COMMUNITY)
Admission: RE | Admit: 2024-12-23 | Discharge: 2024-12-23 | Disposition: A | Source: Ambulatory Visit | Attending: Nephrology | Admitting: Nephrology

## 2024-12-23 DIAGNOSIS — N186 End stage renal disease: Secondary | ICD-10-CM | POA: Insufficient documentation

## 2024-12-23 DIAGNOSIS — Z992 Dependence on renal dialysis: Secondary | ICD-10-CM

## 2024-12-23 MED ORDER — LIDOCAINE-EPINEPHRINE 1 %-1:100000 IJ SOLN
INTRAMUSCULAR | Status: AC
  Filled 2024-12-23: qty 1

## 2024-12-23 MED ORDER — HEPARIN SODIUM (PORCINE) 1000 UNIT/ML IJ SOLN
INTRAMUSCULAR | Status: AC
  Filled 2024-12-23: qty 10

## 2024-12-23 MED ORDER — VANCOMYCIN HCL IN DEXTROSE 1-5 GM/200ML-% IV SOLN
1000.0000 mg | Freq: Once | INTRAVENOUS | Status: AC
  Administered 2024-12-23: 1000 mg via INTRAVENOUS

## 2024-12-23 MED ORDER — VANCOMYCIN HCL IN DEXTROSE 1-5 GM/200ML-% IV SOLN
INTRAVENOUS | Status: AC
  Filled 2024-12-23: qty 200

## 2024-12-23 NOTE — Procedures (Signed)
 Interventional Radiology Procedure Note  Procedure: FLUORO EXCHG RT internal jugular TUNNELED HD CATH    Complications: None  Estimated Blood Loss:  MIN  Findings: TIP SVCRA READY FOR USE     EMERSON FREDERIC SPECKING, MD

## 2024-12-30 ENCOUNTER — Other Ambulatory Visit (HOSPITAL_COMMUNITY)

## 2024-12-31 ENCOUNTER — Other Ambulatory Visit: Payer: Self-pay | Admitting: *Deleted

## 2024-12-31 DIAGNOSIS — Z992 Dependence on renal dialysis: Secondary | ICD-10-CM

## 2024-12-31 NOTE — Progress Notes (Unsigned)
 "  Patient ID: Kimberly Carlson, female   DOB: [birth year], 79 y.o.   MRN: [id number]  Reason for Consult: No chief complaint on file.   Referred by Marlee Bernardino NOVAK, MD  Subjective:     HPI Kimberly Carlson is a 79 y.o. female who presents for evaluation HD access creation.  Past Medical History: 04/07/2020: Acute cystitis without hematuria No date: Back pain     Comment:  herniated disc lower back 06/08/2015: Cocaine use     Comment:  UDS 2013   No date: Diabetes mellitus without complication (HCC) 09/15/2021: Frequent nosebleeds 06/09/2015: Hematuria, undiagnosed cause 05/14/2007: HERNIATED LUMBAR DISC     Comment:  Qualifier: Diagnosis of By: Rosalynn MD, Sara   MRI on               05/05/2007: small soft disk herniation L2-3. Slight mass               effect left L2 nerve root. L5-S1 arthritis with narrowing              of the Right lateral recess.    No date: Hypertension 07/16/2019: Vertigo  Family History  Adopted: Yes   Past Surgical History:  Procedure Laterality Date   ABDOMINAL HYSTERECTOMY     IR TUNNELED CENTRAL VENOUS CATH [facility] W IMG  10/27/2024   IR TUNNELED CENTRAL VENOUS CATH [facility] W IMG  12/23/2024    Short Social History:  Social History   Tobacco Use   Smoking status: Former    Current packs/day: 0.00    Average packs/day: 1 pack/day for 1.2 years (1.2 ttl pk-yrs)    Types: Cigarettes    Start date: 04/26/2014    Quit date: 07/23/2015    Years since quitting: 9.4   Smokeless tobacco: Never   Tobacco comments:    Started in her 52's with multiple quit attempts for about 1 year at a time.  Substance Use Topics   Alcohol use: No    Allergies[1]  Current Outpatient Medications  Medication Sig Dispense Refill   ACCU-CHEK GUIDE TEST test strip USE TO CHECK BLOOD SUGAR SIX TIMES PER DAY 550 strip 3   Accu-Chek Softclix Lancets lancets USE TO CHECK BLOOD SUGAR 6 TIMES PER DAY 600 each 3   acetaminophen  (TYLENOL ) 500 MG tablet Take 1,000 mg by mouth  every 6 (six) hours as needed for mild pain (pain score 1-3) (or headaches).     aspirin  81 MG chewable tablet Chew 81 mg by mouth daily.     atorvastatin  (LIPITOR) 40 MG tablet TAKE 1 TABLET EVERY DAY (Patient taking differently: Take 40 mg by mouth at bedtime.) 90 tablet 3   Blood Glucose Monitoring Suppl (ACCU-CHEK GUIDE ME) w/Device KIT Use to check blood sugar 6x per day. E11.65 1 kit 0   [Paused] calcium -vitamin D  (OSCAL 500/200 D-3) 500-200 MG-UNIT tablet Take 2 tablets by mouth daily with breakfast. (Patient not taking: Reported on 10/25/2024) 180 tablet 3   capsaicin  (ZOSTRIX) 0.025 % cream Apply topically 2 (two) times daily. 60 g 0   carvedilol  (COREG ) 6.25 MG tablet Take 1 tablet (6.25 mg total) by mouth 2 (two) times daily. 60 tablet 0   Cholecalciferol  (VITAMIN D3) 125 MCG (5000 UT) CAPS Take 5,000 Units by mouth daily.     diclofenac  Sodium (VOLTAREN ) 1 % GEL Apply 2 g topically 2 (two) times daily. 100 g 0   DULoxetine  (CYMBALTA ) 30 MG capsule Take 1 capsule (30 mg total) by  mouth daily. 30 capsule 0   gabapentin  (NEURONTIN ) 100 MG capsule Take 2 capsules (200 mg total) by mouth at bedtime. 60 capsule 0   insulin  glargine (LANTUS ) 100 UNIT/ML injection INJECT 20 UNITS UNDER THE SKIN EVERY DAY (DISCARD VIAL 28 DAYS AFTER OPENING) 30 mL 3   Insulin  Syringe-Needle U-100 (DROPLET INSULIN  SYRINGE) 31G X 5/16 1 ML MISC Inject 1 Lancet into the skin daily. 100 each 3   pantoprazole  (PROTONIX ) 40 MG tablet Take 1 tablet (40 mg total) by mouth 2 (two) times daily for 27 days, THEN 1 tablet (40 mg total) daily. 84 tablet 0   polyethylene glycol powder (GLYCOLAX /MIRALAX ) 17 GM/SCOOP powder Take 17 g by mouth daily. Dissolve 1 capful (17g) in 4-8 ounces of liquid and take by mouth daily. 238 g 0   senna (SENOKOT) 8.6 MG TABS tablet Take 1 tablet (8.6 mg total) by mouth daily. 120 tablet 0   torsemide  (DEMADEX ) 100 MG tablet Take 1 tablet (100 mg total) by mouth as directed. Take only on  NON-dialysis days 15 tablet 0   No current facility-administered medications for this visit.    REVIEW OF SYSTEMS All other systems were reviewed and are negative    Objective:  Objective   There were no vitals filed for this visit. There is no height or weight on file to calculate BMI.  Physical Exam General: no acute distress Cardiac: hemodynamically stable Extremities: *** Vascular:   Right: palpable brachial, radial  Left: palpable brachial, radial   Data: UE arterial duplex ***   Vein mapping ***  Note from nephrologist reviewed ***      Assessment/Plan:     Kimberly Carlson is a 79 y.o. female with {CKDACcess:31998} who presents to discuss permanent access creation.  Dominant hand: {RIGHT/LEFT:20294} Previous access surgeries: *** Previous catheters: {RIGHT/LEFT:21944} IJ Currently dialyzing via *** on *** Other arm surgeries/injuries: ***  The vein mapping suggests there is *** a suitable *** and we discussed that they are a candidate for ***  The risks an benefits including of access creation were reviewed including: need for additional procedures, need for additional creations, steal, ischemia monomelic neuropathy, failure of access, and bleeding. The patient expressed understand and is willing to proceed.  I explained that I will perform intraoperative vein mapping to confirm the above findings and will determine the most appropriate access to create but with a preoperative plan for {RIGHT/LEFT:20294} arm ***.     Norman GORMAN Serve MD [facility]    [1]  Allergies Allergen Reactions   Amlodipine  Swelling    Bilateral leg swelling with multiple doses including 2.5mg  daily.  Stopped therapy and edema improved.    Augmentin  [Amoxicillin -Pot Clavulanate] Diarrhea   Clonidine  And Derivatives Other (See Comments)    Light-headedness, dizzy, extreme dry mouth   Doxycycline  Nausea And Vomiting   Metformin Diarrhea    "

## 2025-01-01 ENCOUNTER — Ambulatory Visit (HOSPITAL_COMMUNITY): Admission: RE | Admit: 2025-01-01

## 2025-01-01 ENCOUNTER — Ambulatory Visit: Admitting: Vascular Surgery

## 2025-01-01 ENCOUNTER — Encounter: Payer: Self-pay | Admitting: Vascular Surgery

## 2025-01-01 VITALS — BP 132/71 | HR 68 | Temp 98.1°F | Ht 63.0 in | Wt 168.0 lb

## 2025-01-01 DIAGNOSIS — N186 End stage renal disease: Secondary | ICD-10-CM

## 2025-01-01 DIAGNOSIS — Z992 Dependence on renal dialysis: Secondary | ICD-10-CM
